# Patient Record
Sex: Male | Born: 1960
Health system: Southern US, Community
[De-identification: ages and names within clinical notes are randomized; demographics above are authoritative.]

## PROBLEM LIST (undated history)

## (undated) DIAGNOSIS — N289 Disorder of kidney and ureter, unspecified: Secondary | ICD-10-CM

## (undated) DIAGNOSIS — I10 Essential (primary) hypertension: Secondary | ICD-10-CM

## (undated) DIAGNOSIS — E119 Type 2 diabetes mellitus without complications: Secondary | ICD-10-CM

## (undated) DIAGNOSIS — I639 Cerebral infarction, unspecified: Secondary | ICD-10-CM

## (undated) HISTORY — PX: KNEE SURGERY: SHX244

---

## 2000-01-31 ENCOUNTER — Encounter: Admission: RE | Admit: 2000-01-31 | Discharge: 2000-01-31 | Payer: Self-pay | Admitting: Internal Medicine

## 2007-04-13 ENCOUNTER — Emergency Department (HOSPITAL_COMMUNITY): Admission: EM | Admit: 2007-04-13 | Discharge: 2007-04-13 | Payer: Self-pay | Admitting: Emergency Medicine

## 2007-07-03 ENCOUNTER — Emergency Department (HOSPITAL_COMMUNITY): Admission: EM | Admit: 2007-07-03 | Discharge: 2007-07-05 | Payer: Self-pay | Admitting: Emergency Medicine

## 2007-07-05 ENCOUNTER — Emergency Department (HOSPITAL_COMMUNITY): Admission: EM | Admit: 2007-07-05 | Discharge: 2007-07-06 | Payer: Self-pay | Admitting: Emergency Medicine

## 2007-07-05 ENCOUNTER — Emergency Department (HOSPITAL_COMMUNITY): Admission: EM | Admit: 2007-07-05 | Discharge: 2007-07-05 | Payer: Self-pay | Admitting: Emergency Medicine

## 2007-10-15 ENCOUNTER — Emergency Department (HOSPITAL_COMMUNITY): Admission: EM | Admit: 2007-10-15 | Discharge: 2007-10-15 | Payer: Self-pay | Admitting: Emergency Medicine

## 2008-02-12 ENCOUNTER — Emergency Department (HOSPITAL_COMMUNITY): Admission: EM | Admit: 2008-02-12 | Discharge: 2008-02-13 | Payer: Self-pay | Admitting: Emergency Medicine

## 2008-08-20 ENCOUNTER — Emergency Department (HOSPITAL_COMMUNITY): Admission: EM | Admit: 2008-08-20 | Discharge: 2008-08-20 | Payer: Self-pay | Admitting: Emergency Medicine

## 2008-12-13 ENCOUNTER — Emergency Department (HOSPITAL_COMMUNITY): Admission: EM | Admit: 2008-12-13 | Discharge: 2008-12-13 | Payer: Self-pay | Admitting: Emergency Medicine

## 2009-01-12 ENCOUNTER — Emergency Department (HOSPITAL_COMMUNITY): Admission: EM | Admit: 2009-01-12 | Discharge: 2009-01-12 | Payer: Self-pay | Admitting: Emergency Medicine

## 2009-05-31 ENCOUNTER — Encounter: Payer: Self-pay | Admitting: Cardiology

## 2009-05-31 ENCOUNTER — Ambulatory Visit: Payer: Self-pay | Admitting: Cardiology

## 2009-05-31 ENCOUNTER — Inpatient Hospital Stay (HOSPITAL_COMMUNITY): Admission: EM | Admit: 2009-05-31 | Discharge: 2009-06-03 | Payer: Self-pay | Admitting: Emergency Medicine

## 2009-06-01 ENCOUNTER — Encounter (INDEPENDENT_AMBULATORY_CARE_PROVIDER_SITE_OTHER): Payer: Self-pay | Admitting: Internal Medicine

## 2009-06-01 ENCOUNTER — Encounter: Payer: Self-pay | Admitting: Cardiology

## 2009-06-05 ENCOUNTER — Emergency Department (HOSPITAL_COMMUNITY): Admission: EM | Admit: 2009-06-05 | Discharge: 2009-06-06 | Payer: Self-pay | Admitting: Emergency Medicine

## 2009-06-11 ENCOUNTER — Ambulatory Visit: Payer: Self-pay | Admitting: Internal Medicine

## 2009-06-11 DIAGNOSIS — E119 Type 2 diabetes mellitus without complications: Secondary | ICD-10-CM | POA: Insufficient documentation

## 2009-06-11 DIAGNOSIS — I10 Essential (primary) hypertension: Secondary | ICD-10-CM | POA: Insufficient documentation

## 2009-06-11 DIAGNOSIS — N259 Disorder resulting from impaired renal tubular function, unspecified: Secondary | ICD-10-CM

## 2009-06-11 DIAGNOSIS — E785 Hyperlipidemia, unspecified: Secondary | ICD-10-CM

## 2009-06-11 DIAGNOSIS — F1410 Cocaine abuse, uncomplicated: Secondary | ICD-10-CM

## 2009-06-11 DIAGNOSIS — I519 Heart disease, unspecified: Secondary | ICD-10-CM

## 2009-06-11 LAB — CONVERTED CEMR LAB: Hgb A1c MFr Bld: 9 %

## 2009-07-14 ENCOUNTER — Encounter (INDEPENDENT_AMBULATORY_CARE_PROVIDER_SITE_OTHER): Payer: Self-pay | Admitting: Internal Medicine

## 2009-08-14 ENCOUNTER — Ambulatory Visit: Payer: Self-pay | Admitting: Internal Medicine

## 2009-08-14 DIAGNOSIS — K625 Hemorrhage of anus and rectum: Secondary | ICD-10-CM | POA: Insufficient documentation

## 2009-10-13 ENCOUNTER — Ambulatory Visit: Payer: Self-pay | Admitting: Internal Medicine

## 2009-11-15 LAB — CONVERTED CEMR LAB
ALT: 38 units/L (ref 0–53)
Albumin: 4.3 g/dL (ref 3.5–5.2)
Albumin: 4.6 g/dL (ref 3.5–5.2)
Alkaline Phosphatase: 45 units/L (ref 39–117)
Alkaline Phosphatase: 47 units/L (ref 39–117)
BUN: 19 mg/dL (ref 6–23)
CO2: 24 meq/L (ref 19–32)
Calcium: 9.2 mg/dL (ref 8.4–10.5)
Calcium: 9.2 mg/dL (ref 8.4–10.5)
Chloride: 102 meq/L (ref 96–112)
Cholesterol: 158 mg/dL (ref 0–200)
Creatinine, Ser: 1.45 mg/dL (ref 0.40–1.50)
Glucose, Bld: 120 mg/dL — ABNORMAL HIGH (ref 70–99)
Glucose, Bld: 181 mg/dL — ABNORMAL HIGH (ref 70–99)
LDL Cholesterol: 91 mg/dL (ref 0–99)
Potassium: 4.1 meq/L (ref 3.5–5.3)
Total Bilirubin: 0.5 mg/dL (ref 0.3–1.2)
Total Bilirubin: 0.5 mg/dL (ref 0.3–1.2)
Total CHOL/HDL Ratio: 4.1
Total Protein: 7.1 g/dL (ref 6.0–8.3)
Total Protein: 7.4 g/dL (ref 6.0–8.3)
VLDL: 28 mg/dL (ref 0–40)

## 2009-11-18 ENCOUNTER — Encounter (INDEPENDENT_AMBULATORY_CARE_PROVIDER_SITE_OTHER): Payer: Self-pay | Admitting: *Deleted

## 2010-01-01 ENCOUNTER — Ambulatory Visit: Payer: Self-pay | Admitting: Internal Medicine

## 2010-01-01 LAB — CONVERTED CEMR LAB: Blood Glucose, Fingerstick: 146

## 2010-01-08 LAB — CONVERTED CEMR LAB
CO2: 25 meq/L (ref 19–32)
Calcium: 9.1 mg/dL (ref 8.4–10.5)
Potassium: 4.6 meq/L (ref 3.5–5.3)

## 2010-01-21 ENCOUNTER — Ambulatory Visit: Payer: Self-pay | Admitting: Family Medicine

## 2010-01-21 ENCOUNTER — Encounter (INDEPENDENT_AMBULATORY_CARE_PROVIDER_SITE_OTHER): Payer: Self-pay | Admitting: Internal Medicine

## 2010-01-28 ENCOUNTER — Encounter (INDEPENDENT_AMBULATORY_CARE_PROVIDER_SITE_OTHER): Payer: Self-pay | Admitting: *Deleted

## 2010-05-07 ENCOUNTER — Ambulatory Visit: Payer: Self-pay | Admitting: Internal Medicine

## 2010-05-07 DIAGNOSIS — L219 Seborrheic dermatitis, unspecified: Secondary | ICD-10-CM | POA: Insufficient documentation

## 2010-05-07 LAB — CONVERTED CEMR LAB
AST: 17 units/L (ref 0–37)
BUN: 21 mg/dL (ref 6–23)
Creatinine, Ser: 1.47 mg/dL (ref 0.40–1.50)
Hgb A1c MFr Bld: 7.1 % — ABNORMAL HIGH (ref <5.7)

## 2010-06-04 ENCOUNTER — Encounter (INDEPENDENT_AMBULATORY_CARE_PROVIDER_SITE_OTHER): Payer: Self-pay | Admitting: Internal Medicine

## 2010-06-29 ENCOUNTER — Ambulatory Visit: Payer: Self-pay | Admitting: Internal Medicine

## 2010-07-27 LAB — CONVERTED CEMR LAB
Alkaline Phosphatase: 42 units/L (ref 39–117)
Calcium: 9.3 mg/dL (ref 8.4–10.5)
Cholesterol: 158 mg/dL (ref 0–200)
Creatinine, Ser: 1.41 mg/dL (ref 0.40–1.50)
Sodium: 140 meq/L (ref 135–145)
Total Bilirubin: 0.5 mg/dL (ref 0.3–1.2)
Total Protein: 7.1 g/dL (ref 6.0–8.3)
Triglycerides: 110 mg/dL (ref <150)
VLDL: 22 mg/dL (ref 0–40)

## 2010-07-29 ENCOUNTER — Encounter (INDEPENDENT_AMBULATORY_CARE_PROVIDER_SITE_OTHER): Payer: Self-pay | Admitting: Internal Medicine

## 2010-09-07 NOTE — Letter (Signed)
Summary: *HSN Results Follow up  [facility]  [address] [place], [place] [address]   Phone: [phone]  Fax: [phone]      06/04/2010   Javier Rivera [address] [place], [place]  [address]   Dear  Mr. Javier Rivera,                            ____S.Drinkard,FNP   ____D. Gore,FNP       ____B. McPherson,MD   ____V. Rankins,MD    ___X_E. Mulberry,MD    ____N. Daphine Deutscher, FNP  ____D. Reche Dixon, MD    ____K. Philipp Deputy, MD    ____Other     This letter is to inform you that your recent test(s):  _______Pap Smear    ____X___Lab Test     _______X-ray    ___X____ is within acceptable limits  _______ requires a medication change  _______ requires a follow-up lab visit  _______ requires a follow-up visit with your Sister Carbone   Comments:  Your diabetic  control is better than I thought it would be with A1C of 7.1%.  Kidney function was okay as well.  Make sure you follow up for fasting labs as in your instructions at last visit.       _________________________________________________________ If you have any questions, please contact our office                     Sincerely,  Julieanne Manson MD [facility]

## 2010-09-07 NOTE — Letter (Signed)
Summary: [facility]  [facility]   Imported By: Arta Bruce 07/07/2010 16:13:48  _____________________________________________________________________  External Attachment:    Type:   Image     Comment:   External Document

## 2010-09-07 NOTE — Letter (Signed)
Summary: [facility]  [facility]   Imported ByArta Bruce 05/13/2010 14:49:37  _____________________________________________________________________  External Attachment:    Type:   Image     Comment:   External Document

## 2010-09-07 NOTE — Letter (Signed)
Summary: *HSN Results Follow up  [facility]  [address] [place], [place] [address]   Phone: [phone]  Fax: [phone]      11/18/2009   Javier Rivera [address] [place], [place]  [address]   Dear  [name],                            ____S.Drinkard,FNP   ____D. Gore,FNP       ____B. McPherson,MD   ____V. Rankins,MD    __xx__E. Mulberry,MD    ____N. Daphine Deutscher, FNP  ____D. Reche Dixon, MD    ____K. Philipp Deputy, MD    ____Other     This letter is to inform you that your recent test(s):  _______Pap Smear    ____xx___Lab Test     _______X-ray     _______ is within acceptable limits  ___xx____ requires a medication change  _______ requires a follow-up lab visit   ____ requires a follow-up visit with your provider       Please give [place] a call regarding your recent lab work thank you.      _________________________________________________________ If you have any questions, please contact our office                     Sincerely,  Vesta Mixer CMA [facility]

## 2010-09-07 NOTE — Assessment & Plan Note (Signed)
Summary: f/u with Javier Rivera in 3 months/ dm htn//gk   Vital Signs:  Patient profile:   50 year old male Weight:      276 pounds Temp:     97.9 degrees F Pulse rate:   73 / minute Pulse rhythm:   regular Resp:     18 per minute BP sitting:   124 / 82  (left arm) Cuff size:   large  Vitals Entered By: Javier Rivera CMA (Jan 01, 2010 11:49 AM) CC: f/u dm and htn Is Patient Diabetic? Yes Pain Assessment Patient in pain? no      CBG Result 146  Does patient need assistance? Ambulation Normal   CC:  f/u dm and htn.  History of Present Illness: 1.  Hypertension:  BP much better controlled with increase in Benazepril.  2.  DM:  Pt's creatinine was okay after last visit.  Not checking sugars regularly--when does, around 140.  Missed both Retasure and Diabetic educator appt.  Since last here, cut way back on sugary foods.  Pt. also planning to increase exercise.  Would like to see how he does without adding Metformin.    3. Hyperlipidemia:  Just started 40 mg of Pravastatin--switched by pharmacy to this.  Not forgetting meds now.  Taking Pravastatin in the morning.  Current Medications (verified): 1)  Hydrochlorothiazide 25 Mg Tabs (Hydrochlorothiazide) .Marland Kitchen.. 1 Tab By Mouth Once Daily 2)  Benazepril Hcl 40 Mg Tabs (Benazepril Hcl) .Marland Kitchen.. 1 Tab By Mouth Daily 3)  Amlodipine Besylate 10 Mg Tabs (Amlodipine Besylate) .Marland Kitchen.. 1 Tab By Mouth Once Daily 4)  Simvastatin 20 Mg Tabs (Simvastatin) .Marland Kitchen.. 1 Tab By Mouth Daily 5)  Aspir-Low 81 Mg Tbec (Aspirin) .Marland Kitchen.. 1 Tab By Mouth Once Daily 6)  Glucotrol Xl 5 Mg Xr24h-Tab (Glipizide) .Marland Kitchen.. 1 Tab By Mouth Daily With Morning Meal 7)  Katheren Puller Presto W/device Kit (Blood Glucose Monitoring Suppl) .... Two Times A Day Glucose Checks 250.02 8)  Wavesense Presto Test  Strp (Glucose Blood) .... Two Times A Day Blood Glucose Checks 9)  Lancets  Misc (Lancets) .... Two Times A Day Glucose Checks  Allergies (verified): No Known Drug Allergies  Physical  Exam  Lungs:  Normal respiratory effort, chest expands symmetrically. Lungs are clear to auscultation, no crackles or wheezes. Heart:  Normal rate and regular rhythm. S1 and S2 normal without gallop, murmur, click, rub or other extra sounds.  Radial pulses normal and equal   Impression & Recommendations:  Problem # 1:  ESSENTIAL HYPERTENSION (ICD-401.9) Much better control His updated medication list for this problem includes:    Hydrochlorothiazide 25 Mg Tabs (Hydrochlorothiazide) .Marland Kitchen... 1 tab by mouth once daily    Benazepril Hcl 40 Mg Tabs (Benazepril hcl) .Marland Kitchen... 1 tab by mouth daily    Amlodipine Besylate 10 Mg Tabs (Amlodipine besylate) .Marland Kitchen... 1 tab by mouth once daily  Orders: T-Basic Metabolic Panel [phone])  Problem # 2:  DYSLIPIDEMIA (ICD-272.4) Changed to Pravastatin by pharmacy--just initiated. His updated medication list for this problem includes:    Pravastatin Sodium 40 Mg Tabs (Pravastatin sodium) .Marland Kitchen... 1 tab by mouth daily  Problem # 3:  DIABETES MELLITUS, TYPE II, UNCONTROLLED (ICD-250.02) Hold on adding Metformin until next follow up His updated medication list for this problem includes:    Benazepril Hcl 40 Mg Tabs (Benazepril hcl) .Marland Kitchen... 1 tab by mouth daily    Aspir-low 81 Mg Tbec (Aspirin) .Marland Kitchen... 1 tab by mouth once daily    Glucotrol Xl 5 Mg Xr24h-tab (  Glipizide) .Marland Kitchen... 1 tab by mouth daily with morning meal  Complete Medication List: 1)  Hydrochlorothiazide 25 Mg Tabs (Hydrochlorothiazide) .Marland Kitchen.. 1 tab by mouth once daily 2)  Benazepril Hcl 40 Mg Tabs (Benazepril hcl) .Marland Kitchen.. 1 tab by mouth daily 3)  Amlodipine Besylate 10 Mg Tabs (Amlodipine besylate) .Marland Kitchen.. 1 tab by mouth once daily 4)  Pravastatin Sodium 40 Mg Tabs (Pravastatin sodium) .Marland Kitchen.. 1 tab by mouth daily 5)  Aspir-low 81 Mg Tbec (Aspirin) .Marland Kitchen.. 1 tab by mouth once daily 6)  Glucotrol Xl 5 Mg Xr24h-tab (Glipizide) .Marland Kitchen.. 1 tab by mouth daily with morning meal 7)  Glenetta Borg W/device Kit (Blood glucose  monitoring suppl) .... Two times a day glucose checks 250.02 8)  Wavesense Presto Test Strp (Glucose blood) .... Two times a day blood glucose checks 9)  Lancets Misc (Lancets) .... Two times a day glucose checks  Other Orders: Capillary Blood Glucose/CBG ([address])  Patient Instructions: 1)  Reschedule Retasure 2)  REschedule appt. with Susie Piper 3)  Fasting labs in 6 weeks--FLP, liver 4)  Follow up with Dr. Delrae Alfred in 4 months --DM

## 2010-09-07 NOTE — Assessment & Plan Note (Signed)
Summary: FOLLOW UP WITH DR Savalas Monje IN 2 MONTHS / NEEDS LABS//GK   Vital Signs:  Patient profile:   50 year old male Weight:      279.1 pounds Temp:     97.7 degrees F oral Pulse rate:   77 / minute Pulse rhythm:   regular Resp:     21 per minute BP sitting:   159 / 111  (left arm) Cuff size:   large  Vitals Entered By: Geanie Cooley (August 14, 2009 10:08 AM) CC: pt here for followup, pt states he was constipated wednesday he had a lttle l blood in his stool, he states hes been having regular bowel movements since then and hasnt had anymore blood in his stool. Is Patient Diabetic? No Pain Assessment Patient in pain? no       Does patient need assistance? Functional Status Self care Ambulation Normal   CC:  pt here for followup, pt states he was constipated wednesday he had a lttle l blood in his stool, and he states hes been having regular bowel movements since then and hasnt had anymore blood in his stool.Marland Kitchen  History of Present Illness: 1.  Hypertension:  Pt. with 2 weeks left of meds and should have been refilled based on dates of refills, but pt. unable to pick up meds until 2 weeks ago--which correlates with how many pills left.  Taking low dose Benazepril, Norvasc 10 mg, 25 mg HCTZ.  Tolerating okay  2.  DM:  Has not had pneumovax in hospital that he can recall.  Discussed diet and exercise.  Has been set up with [facility] card since last here--can get set up with ancillary services now.  No way to check sugars at home currently.  3.  Constipation 2 days ago after eating a lot of cheese--straining at toilet and noted blood on tissue only.  Since then normal BMs with no blood.    Allergies (verified): No Known Drug Allergies  Physical Exam  General:  NAD Lungs:  Normal respiratory effort, chest expands symmetrically. Lungs are clear to auscultation, no crackles or wheezes. Heart:  Normal rate and regular rhythm. S1 and S2 normal without gallop, murmur, click, rub  or other extra sounds.  Radial pulses normal and equal. Extremities:  No edema   Impression & Recommendations:  Problem # 1:  ESSENTIAL HYPERTENSION (ICD-401.9)  Not controlled, but not on meds long. Bump up Benazepril to 20 mg His updated medication list for this problem includes:    Hydrochlorothiazide 25 Mg Tabs (Hydrochlorothiazide) .Marland Kitchen... 1 tab by mouth once daily    Benazepril Hcl 20 Mg Tabs (Benazepril hcl) .Marland Kitchen... 1 tab by mouth daily    Amlodipine Besylate 10 Mg Tabs (Amlodipine besylate) .Marland Kitchen... 1 tab by mouth once daily  Orders: T-Comprehensive Metabolic Panel ([id number]) Nutrition Referral (Nutrition)  Problem # 2:  DIABETES MELLITUS, TYPE II, UNCONTROLLED (ICD-250.02) 20 minute discussion alone regarding complications and prevention, checking feet, following sugars, A1C, eye exams Check renal function to see if can start Metformin His updated medication list for this problem includes:    Benazepril Hcl 20 Mg Tabs (Benazepril hcl) .Marland Kitchen... 1 tab by mouth daily    Aspir-low 81 Mg Tbec (Aspirin) .Marland Kitchen... 1 tab by mouth once daily    Glucotrol Xl 5 Mg Xr24h-tab (Glipizide) .Marland Kitchen... 1 tab by mouth daily with morning meal  Orders: T-Comprehensive Metabolic Panel ([id number]) Nutrition Referral (Nutrition)  Problem # 3:  RENAL INSUFFICIENCY (ICD-588.9) Recheck labs  Problem # 4:  DYSLIPIDEMIA (ICD-272.4) Not on meds long enough to test His updated medication list for this problem includes:    Simvastatin 10 Mg Tabs (Simvastatin) .Marland Kitchen... 1 tab by mouth once daily  Orders: Nutrition Referral (Nutrition)  Problem # 5:  RECTAL BLEEDING (ICD-569.3) Guaiac card packet--suspect just from straining--will see back for CPE in near future--once other health concerns equilibrate.  Problem # 6:  Preventive Health Care (ICD-V70.0) Pneumovax  Complete Medication List: 1)  Hydrochlorothiazide 25 Mg Tabs (Hydrochlorothiazide) .Marland Kitchen.. 1 tab by mouth once daily 2)  Benazepril Hcl 20 Mg Tabs  (Benazepril hcl) .Marland Kitchen.. 1 tab by mouth daily 3)  Amlodipine Besylate 10 Mg Tabs (Amlodipine besylate) .Marland Kitchen.. 1 tab by mouth once daily 4)  Simvastatin 10 Mg Tabs (Simvastatin) .Marland Kitchen.. 1 tab by mouth once daily 5)  Aspir-low 81 Mg Tbec (Aspirin) .Marland Kitchen.. 1 tab by mouth once daily 6)  Glucotrol Xl 5 Mg Xr24h-tab (Glipizide) .Marland Kitchen.. 1 tab by mouth daily with morning meal 7)  Glenetta Borg W/device Kit (Blood glucose monitoring suppl) .... Two times a day glucose checks 250.02 8)  Wavesense Presto Test Strp (Glucose blood) .... Two times a day blood glucose checks 9)  Lancets Misc (Lancets) .... Two times a day glucose checks  Other Orders: Pneumococcal Vaccine (376[address]) Admin 1st Vaccine (31517) Admin 1st Vaccine [facility]) [phone])  Patient Instructions: 1)  Take 2 tabs of 10 mg Benazepril daily until get 20 mg tabs--then go back to 1 tab daily 2)  BP check --nurse visit in 3 weeks with CMET (V58.69) and FLP 272.2 3)  Follow up with Dr. Delrae Alfred in 2 months  DM, htn 4)  Call if you do not hear from Nutrition in 2 weeks. 5)  Schedule Retasure 6)  Check feet nightly 7)  Good foot care Prescriptions: GLUCOTROL XL 5 MG XR24H-TAB (GLIPIZIDE) 1 tab by mouth daily with morning meal  #30 x 11   Entered and Authorized by:   Julieanne Manson MD   Signed by:   Julieanne Manson MD on 08/14/2009   Method used:   Faxed to ...       [facility] (retail)       [address] [place], [place]  [address]       Ph: [phone]       Fax: [phone]   RxID:   [id number] ASPIR-LOW 81 MG TBEC (ASPIRIN) 1 tab by mouth once daily  #30 x 11   Entered and Authorized by:   Julieanne Manson MD   Signed by:   Julieanne Manson MD on 08/14/2009   Method used:   Faxed to ...       [facility] - [facility] (retail)       [address] [place], [place]  [address]       Ph: [phone]       Fax: [phone]   RxID:    [id number] SIMVASTATIN 10 MG TABS (SIMVASTATIN) 1 tab by mouth once daily  #30 x 11   Entered and Authorized by:   Julieanne Manson MD   Signed by:   Julieanne Manson MD on 08/14/2009   Method used:   Faxed to ...       [facility] - Pharmac (retail)       [address] [place], [place]  [address]       Ph:  [phone]       Fax: [phone]   RxID:   [id number] AMLODIPINE BESYLATE 10 MG TABS (AMLODIPINE BESYLATE) 1 tab by mouth once daily  #30 x 11   Entered and Authorized by:   Julieanne Manson MD   Signed by:   Julieanne Manson MD on 08/14/2009   Method used:   Faxed to ...       [facility] - Pharmac (retail)       [address] [place], [place]  [address]       Ph: [phone]       Fax: [phone]   RxID:   [id number] HYDROCHLOROTHIAZIDE 25 MG TABS (HYDROCHLOROTHIAZIDE) 1 tab by mouth once daily  #30 x 11   Entered and Authorized by:   Julieanne Manson MD   Signed by:   Julieanne Manson MD on 08/14/2009   Method used:   Faxed to ...       [facility] - Pharmac (retail)       [address] [place], [place]  [address]       Ph: [phone]       Fax: [phone]   RxID:   [id number] LANCETS  MISC (LANCETS) two times a day glucose checks  #60 x 11   Entered and Authorized by:   Julieanne Manson MD   Signed by:   Julieanne Manson MD on 08/14/2009   Method used:   Faxed to ...       [facility] - Pharmac (retail)       [address] [place], [place]  [address]       Ph: [phone]       Fax: [phone]   RxID:   [id number] WAVESENSE PRESTO TEST  STRP (GLUCOSE BLOOD) two times a day blood glucose checks  #60 x 11   Entered and Authorized by:   Julieanne Manson MD   Signed by:   Julieanne Manson MD on 08/14/2009   Method used:   Faxed to ...       [facility] - Pharmac (retail)       [address] [place], [place]  [address]       Ph: [phone]       Fax: [phone]   RxID:   [id number] WAVESENSE PRESTO W/DEVICE KIT (BLOOD GLUCOSE MONITORING SUPPL) two times a day glucose checks 250.02  #1 x 0   Entered and Authorized by:   Julieanne Manson MD   Signed by:   Julieanne Manson MD on 08/14/2009   Method used:   Faxed to ...       [facility] - Pharmac (retail)       [address] [place], [place]  [address]       Ph: [phone]       Fax: [phone]   RxID:   [id number] BENAZEPRIL HCL 20 MG TABS (BENAZEPRIL HCL) 1 tab by mouth daily  #30 x 11   Entered and Authorized by:   Julieanne Manson MD   Signed by:   Julieanne Manson MD on 08/14/2009   Method used:   Faxed to ...       [facility] - Pharmac (retail)       [address]  [place], [place]  [address]       Ph: [phone]       Fax: [phone]   RxID:   [id number]    Vital Signs:  Patient profile:   50 year old male Weight:      279.1 pounds Temp:     97.7 degrees F oral Pulse rate:   77 / minute Pulse rhythm:   regular Resp:     21 per minute BP sitting:   159 / 111  (left arm) Cuff size:   large  Vitals Entered By: Geanie Cooley (August 14, 2009 10:08 AM)   Tetanus/Td Immunization History:    Tetanus/Td # 1:  Historical (11/06/2008)  Pneumovax Vaccine    Vaccine Type: Pneumovax    Site: right deltoid    Mfr: Merck    Dose: 0.5 ml    Route: IM    Given by: Vesta Mixer CMA    Exp. Date: 09/04/2010    Lot #: [id number]    VIS given: 03/05/96 version given August 14, 2009.

## 2010-09-07 NOTE — Assessment & Plan Note (Signed)
Summary: 4 month f/u /tmm   Vital Signs:  Patient profile:   50 year old male Height:      80 inches Weight:      287.3 pounds BMI:     31.68 Temp:     97.8 degrees F Pulse rate:   78 / minute Resp:     16 per minute BP sitting:   140 / 100  (left arm) Cuff size:   large  Vitals Entered By: Michelle Nasuti (May 07, 2010 11:38 AM) CC: follow-up visit. pt c/o fungus on R 4th toe. unable to cut toenail Pain Assessment Patient in pain? no       Does patient need assistance? Ambulation Normal   CC:  follow-up visit. pt c/o fungus on R 4th toe. unable to cut toenail.  History of Present Illness: 1.  Pink raised  ovoid spots on face:  cannot say how long they have been there.  He did not bring this up--just noted.  No other areas of skin involved.  Has not been sweating a lot.  Has had this before--comes and goes.  sometimes flakes.  2.  Right 4th toenail is thickened and irritated.  Hard to cut.  Uncomfortable.  Has a few other nails that are less involved.  3.  Renal Insufficiency:  We were unable to get hold of pt. to recheck.  Pt. gives me multiple phone numbers, but one phone is currently broken.  4.  DM:  not checking sugars.  No polyuria and polydipsia.  5.  Hypertension: did not take bp med today.  Will not be able to pick up until next week.  Allergies (verified): No Known Drug Allergies  Physical Exam  General:  Raised pink ovoid and circular lesions on skin--continuous area of involvement in mustache area and nasolabial folds, chin.  No other areas of involvement Lungs:  Normal respiratory effort, chest expands symmetrically. Lungs are clear to auscultation, no crackles or wheezes. Heart:  Normal rate and regular rhythm. S1 and S2 normal without gallop, murmur, click, rub or other extra sounds. Extremities:  Thickened discolored distal toenails on right great and 2nd toe.  4th toenail very thickened with underlying crumbling all the way to base.  Little  toenails involved bilaterally.   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   Also add topical hydrocortisone.  Problem # 2:  RENAL INSUFFICIENCY (ICD-588.9)  Recheck labs--encouraged better follow up  Orders: T-Comprehensive Metabolic Panel ([id number])  Problem # 3:  DYSLIPIDEMIA (ICD-272.4) Return for labs in 6 weeks--off meds for next few days as did not call in early enough His updated medication list for this problem includes:    Pravastatin Sodium 40 Mg Tabs (Pravastatin sodium) .Marland Kitchen... 1 tab by mouth daily  Problem # 4:  DIABETES MELLITUS, TYPE II, UNCONTROLLED (ICD-250.02) Suspect will not be well controlled Flu vaccine today His updated medication list for this problem includes:    Benazepril Hcl 40 Mg Tabs (Benazepril hcl) .Marland Kitchen... 1 tab by mouth daily    Aspir-low 81 Mg Tbec (Aspirin) .Marland Kitchen... 1 tab by mouth once daily    Glucotrol Xl 5 Mg Xr24h-tab (Glipizide) .Marland Kitchen... 1 tab by mouth daily with morning meal  Orders: T-Comprehensive Metabolic Panel [phone]) T- Hemoglobin A1C ([id number])  Problem # 5:  ESSENTIAL HYPERTENSION (ICD-401.9) Elevated--off meds currently His updated medication list for this problem includes:    Hydrochlorothiazide 25 Mg Tabs (Hydrochlorothiazide) .Marland Kitchen... 1 tab by mouth  once daily    Benazepril Hcl 40 Mg Tabs (Benazepril hcl) .Marland Kitchen... 1 tab by mouth daily    Amlodipine Besylate 10 Mg Tabs (Amlodipine besylate) .Marland Kitchen... 1 tab by mouth once daily  Complete Medication List: 1)  Hydrochlorothiazide 25 Mg Tabs (Hydrochlorothiazide) .Marland Kitchen.. 1 tab by mouth once daily 2)  Benazepril Hcl 40 Mg Tabs (Benazepril hcl) .Marland Kitchen.. 1 tab by mouth daily 3)  Amlodipine Besylate 10 Mg Tabs (Amlodipine besylate) .Marland Kitchen.. 1 tab by mouth once daily 4)  Pravastatin Sodium 40 Mg Tabs (Pravastatin sodium) .Marland Kitchen.. 1 tab by mouth daily 5)  Aspir-low 81 Mg Tbec (Aspirin) .Marland Kitchen.. 1 tab by mouth once daily 6)   Glucotrol Xl 5 Mg Xr24h-tab (Glipizide) .Marland Kitchen.. 1 tab by mouth daily with morning meal 7)  Glenetta Borg W/device Kit (Blood glucose monitoring suppl) .... Two times a day glucose checks 250.02 8)  Wavesense Presto Test Strp (Glucose blood) .... Two times a day blood glucose checks 9)  Lancets Misc (Lancets) .... Two times a day glucose checks 10)  Hydrocortisone 2.5 % Crea (Hydrocortisone) .... Apply two times a day to facial lesions as needed--small amt only 11)  Terbinafine Hcl 250 Mg Tabs (Terbinafine hcl) .Marland Kitchen.. 1 tab by mouth daily for 12 weeks  Other Orders: Influenza Vaccine NON MCR ([address])  Patient Instructions: 1)  fasting labs in 6 weeks:  FLP, CMet:  272.4 and V58.69 Prescriptions: PRAVASTATIN SODIUM 40 MG TABS (PRAVASTATIN SODIUM) 1 tab by mouth daily  #30 x 11   Entered and Authorized by:   Julieanne Manson MD   Signed by:   Julieanne Manson MD on 05/07/2010   Method used:   Faxed to ...       [facility] - Pharmac (retail)       [address] [place], [place]  [address]       Ph: [phone]       Fax: [phone]   RxID:   [id number] TERBINAFINE HCL 250 MG TABS (TERBINAFINE HCL) 1 tab by mouth daily for 12 weeks  #84 x 0   Entered and Authorized by:   Julieanne Manson MD   Signed by:   Julieanne Manson MD on 05/07/2010   Method used:   Faxed to ...       [facility] - Pharmac (retail)       [address] [place], [place]  [address]       Ph: [phone]       Fax: [phone]   RxID:   [id number] HYDROCORTISONE 2.5 % CREA (HYDROCORTISONE) apply two times a day to facial lesions as needed--small amt only  #30 g x 1   Entered and Authorized by:   Julieanne Manson MD   Signed by:   Julieanne Manson MD on 05/07/2010   Method used:   Faxed to ...       [facility] - Pharmac (retail)       [address] [place], [place]  [address]       Ph:  [phone]       Fax: [phone]   RxID:   [id number]    Influenza Vaccine    Vaccine Type: Fluvax Non-MCR    Site: left deltoid    Mfr: Sanofi Pasteur    Dose: 0.5 ml    Route: IM    Given by: Michelle Nasuti  Exp. Date: 02/05/2011    Lot #: [id number]    VIS given: 03/02/10 version given May 07, 2010.  Flu Vaccine Consent Questions    Do you have a history of severe allergic reactions to this vaccine? no    Any prior history of allergic reactions to egg and/or gelatin? no    Do you have a sensitivity to the preservative Thimersol? no    Do you have a past history of Guillan-Barre Syndrome? no    Do you currently have an acute febrile illness? no    Have you ever had a severe reaction to latex? no    Vaccine information given and explained to patient? yes

## 2010-09-07 NOTE — Letter (Signed)
Summary: *HSN Results Follow up  [facility]  [address] [place], [place] [address]   Phone: [phone]  Fax: [phone]      01/28/2010   ORYN CASANOVA [address] [place], [place]  [address]   Dear  Mr. DAEQUAN KOZMA,                            ____S.Drinkard,FNP   ____D. Gore,FNP       ____B. McPherson,MD   ____V. Rankins,MD    __X__E. Mulberry,MD    ____N. Daphine Deutscher, FNP  ____D. Reche Dixon, MD    ____K. Philipp Deputy, MD    ____Other     This letter is to inform you that your recent test(s):  _______Pap Smear    _______Lab Test     _______X-ray    _______ is within acceptable limits  _______ requires a medication change  ___X____ requires a follow-up lab visit  _______ requires a follow-up visit with your provider   Comments:  Please give our office a call to schedule a lab appointment.  Thank  you.       _________________________________________________________ If you have any questions, please contact our office                     Sincerely,  Vesta Mixer CMA [facility]

## 2010-09-07 NOTE — Assessment & Plan Note (Signed)
Summary: follow up with Dr Delrae Alfred in months dm/htn///gk   Vital Signs:  Patient profile:   50 year old male Weight:      277 pounds Temp:     97.7 degrees F Pulse rate:   82 / minute Pulse rhythm:   regular Resp:     20 per minute BP sitting:   144 / 109  (left arm) Cuff size:   large  Vitals Entered By: Vesta Mixer CMA (October 13, 2009 9:56 AM) CC: f/u htn and dm.  Pt is fasting, did not take meds either.  Has not heard from nutrition. Is Patient Diabetic? Yes Pain Assessment Patient in pain? no      CBG Result 143  Does patient need assistance? Ambulation Normal   CC:  f/u htn and dm.  Pt is fasting and did not take meds either.  Has not heard from nutrition.Marland Kitchen  History of Present Illness: 1.  Hypertension:  Did not take meds today as fasting.  Misses appt. for follow up nursing visit for bp check.  Checks bp at home.  States bp running at home what bp is today.  Later, obvious he misses a week at a time every month as does not get med picked up.    2.  Hyperlipidemia:  Missing meds as above.  Also misses as forgets in evening.  Does not miss morning meds.  Fasting today.  3.  DM:  Sugars running between 120- 140.  Never heard from Nutritionist. Did not get a second CMET--pt. missed appt.   Would like to see a normal creatinine x2 before considering Metformin.  Current Medications (verified): 1)  Hydrochlorothiazide 25 Mg Tabs (Hydrochlorothiazide) .Marland Kitchen.. 1 Tab By Mouth Once Daily 2)  Benazepril Hcl 20 Mg Tabs (Benazepril Hcl) .Marland Kitchen.. 1 Tab By Mouth Daily 3)  Amlodipine Besylate 10 Mg Tabs (Amlodipine Besylate) .Marland Kitchen.. 1 Tab By Mouth Once Daily 4)  Simvastatin 10 Mg Tabs (Simvastatin) .Marland Kitchen.. 1 Tab By Mouth Once Daily 5)  Aspir-Low 81 Mg Tbec (Aspirin) .Marland Kitchen.. 1 Tab By Mouth Once Daily 6)  Glucotrol Xl 5 Mg Xr24h-Tab (Glipizide) .Marland Kitchen.. 1 Tab By Mouth Daily With Morning Meal 7)  Katheren Puller Presto W/device Kit (Blood Glucose Monitoring Suppl) .... Two Times A Day Glucose Checks  250.02 8)  Wavesense Presto Test  Strp (Glucose Blood) .... Two Times A Day Blood Glucose Checks 9)  Lancets  Misc (Lancets) .... Two Times A Day Glucose Checks  Allergies (verified): No Known Drug Allergies  Physical Exam  General:  NAD--hungry Lungs:  Normal respiratory effort, chest expands symmetrically. Lungs are clear to auscultation, no crackles or wheezes. Heart:  Normal rate and regular rhythm. S1 and S2 normal without gallop, murmur, click, rub or other extra sounds.  Radial pulses normal and equal.   Impression & Recommendations:  Problem # 1:  RENAL INSUFFICIENCY (ICD-588.9) Recheck labs. If okay, add Metformin Orders: T-Comprehensive Metabolic Panel [id number]) T-Lipid Profile ([id number])  Problem # 2:  DYSLIPIDEMIA (ICD-272.4)  His updated medication list for this problem includes:    Simvastatin 10 Mg Tabs (Simvastatin) .Marland Kitchen... 1 tab by mouth once daily  Problem # 3:  DIABETES MELLITUS, TYPE II, UNCONTROLLED (ICD-250.02) As under renal insufficiency Improved control His updated medication list for this problem includes:    Benazepril Hcl 40 Mg Tabs (Benazepril hcl) .Marland Kitchen... 1 tab by mouth daily    Aspir-low 81 Mg Tbec (Aspirin) .Marland Kitchen... 1 tab by mouth once daily    Glucotrol Xl 5 Mg  Xr24h-tab (Glipizide) .Marland Kitchen... 1 tab by mouth daily with morning meal  Orders: T-Comprehensive Metabolic Panel [phone]) T-Lipid Profile ([id number]) Hgb A1C (72536UY)  Problem # 4:  ESSENTIAL HYPERTENSION (ICD-401.9) Not controlled, though suspect some issues with compliance. Increase Benazepril His updated medication list for this problem includes:    Hydrochlorothiazide 25 Mg Tabs (Hydrochlorothiazide) .Marland Kitchen... 1 tab by mouth once daily    Benazepril Hcl 40 Mg Tabs (Benazepril hcl) .Marland Kitchen... 1 tab by mouth daily    Amlodipine Besylate 10 Mg Tabs (Amlodipine besylate) .Marland Kitchen... 1 tab by mouth once daily  Complete Medication List: 1)  Hydrochlorothiazide 25 Mg Tabs  (Hydrochlorothiazide) .Marland Kitchen.. 1 tab by mouth once daily 2)  Benazepril Hcl 40 Mg Tabs (Benazepril hcl) .Marland Kitchen.. 1 tab by mouth daily 3)  Amlodipine Besylate 10 Mg Tabs (Amlodipine besylate) .Marland Kitchen.. 1 tab by mouth once daily 4)  Simvastatin 10 Mg Tabs (Simvastatin) .Marland Kitchen.. 1 tab by mouth once daily 5)  Aspir-low 81 Mg Tbec (Aspirin) .Marland Kitchen.. 1 tab by mouth once daily 6)  Glucotrol Xl 5 Mg Xr24h-tab (Glipizide) .Marland Kitchen.. 1 tab by mouth daily with morning meal 7)  Glenetta Borg W/device Kit (Blood glucose monitoring suppl) .... Two times a day glucose checks 250.02 8)  Wavesense Presto Test Strp (Glucose blood) .... Two times a day blood glucose checks 9)  Lancets Misc (Lancets) .... Two times a day glucose checks  Other Orders: Capillary Blood Glucose/CBG ([address]347)  Patient Instructions: 1)  Appt. with Susie Piper for nutrition and lifestyle teaching. 2)  Schedule for [facility]--pt did not make previous appt.Marland Kitchen 3)  BP check and Bmet in 1 month 4)  Follow up with Dr. Delrae Alfred in 3 months --DM, htn Prescriptions: BENAZEPRIL HCL 40 MG TABS (BENAZEPRIL HCL) 1 tab by mouth daily  #30 x 11   Entered and Authorized by:   Julieanne Manson MD   Signed by:   Julieanne Manson MD on 10/13/2009   Method used:   Faxed to ...       [facility] (retail)       [address] [place], [place]  [address]       Ph: [phone]       Fax: [phone]   RxID:   [id number]   Laboratory Results   Blood Tests     HGBA1C: 7.7%   (Normal Range: Non-Diabetic - 3-6%   Control Diabetic - 6-8%) CBG Random:: 143mg /dL

## 2010-09-09 NOTE — Letter (Signed)
Summary: *HSN Results Follow up  [facility]  [address] [place], [place] [address]   Phone: [phone]  Fax: [phone]      07/29/2010   Javier Rivera [address] [place], [place]  [address]   Dear  Javier Rivera,                            ____S.Drinkard,FNP   ____D. Gore,FNP       ____B. McPherson,MD   ____V. Rankins,MD    ____E. Mulberry,MD    ____N. Daphine Deutscher, FNP  ____D. Reche Dixon, MD    ____K. Philipp Deputy, MD    ____Other     This letter is to inform you that your recent test(s):  _______Pap Smear    ____X___Lab Test     _______X-ray    _______ is within acceptable limits  ___X____ requires a medication change  _______ requires a follow-up lab visit  _______ requires a follow-up visit with your Deanndra Kirley   Comments:  We have been trying to reach you at [phone].  Please give the office a call.       _________________________________________________________ If you have any questions, please contact our office                     Sincerely,  [place] Shannon [facility]

## 2010-11-11 LAB — DIFFERENTIAL
Basophils Absolute: 0 10*3/uL (ref 0.0–0.1)
Basophils Absolute: 0 10*3/uL (ref 0.0–0.1)
Basophils Relative: 0 % (ref 0–1)
Basophils Relative: 1 % (ref 0–1)
Eosinophils Absolute: 0.1 10*3/uL (ref 0.0–0.7)
Eosinophils Relative: 0 % (ref 0–5)
Eosinophils Relative: 1 % (ref 0–5)
Monocytes Absolute: 0.3 10*3/uL (ref 0.1–1.0)
Monocytes Relative: 7 % (ref 3–12)
Neutro Abs: 2.3 10*3/uL (ref 1.7–7.7)

## 2010-11-11 LAB — CBC
HCT: 52.1 % — ABNORMAL HIGH (ref 39.0–52.0)
Hemoglobin: 14.9 g/dL (ref 13.0–17.0)
Hemoglobin: 17.6 g/dL — ABNORMAL HIGH (ref 13.0–17.0)
MCHC: 33.3 g/dL (ref 30.0–36.0)
MCHC: 33.8 g/dL (ref 30.0–36.0)
MCV: 89.9 fL (ref 78.0–100.0)
MCV: 90.1 fL (ref 78.0–100.0)
Platelets: 216 10*3/uL (ref 150–400)
RBC: 4.92 MIL/uL (ref 4.22–5.81)
RBC: 5.79 MIL/uL (ref 4.22–5.81)
RDW: 13.5 % (ref 11.5–15.5)
WBC: 4.9 10*3/uL (ref 4.0–10.5)
WBC: 6.9 10*3/uL (ref 4.0–10.5)

## 2010-11-11 LAB — URINALYSIS, ROUTINE W REFLEX MICROSCOPIC
Leukocytes, UA: NEGATIVE
Specific Gravity, Urine: 1.036 — ABNORMAL HIGH (ref 1.005–1.030)
Urobilinogen, UA: 1 mg/dL (ref 0.0–1.0)
pH: 5.5 (ref 5.0–8.0)

## 2010-11-11 LAB — ETHANOL: Alcohol, Ethyl (B): <5 mg/dL (ref 0–10)

## 2010-11-11 LAB — BASIC METABOLIC PANEL
CO2: 25 mEq/L (ref 19–32)
CO2: 29 mEq/L (ref 19–32)
Calcium: 8.4 mg/dL (ref 8.4–10.5)
Calcium: 9.6 mg/dL (ref 8.4–10.5)
Chloride: 103 mEq/L (ref 96–112)
Chloride: 103 mEq/L (ref 96–112)
GFR calc Af Amer: 53 mL/min — ABNORMAL LOW (ref >60)
Glucose, Bld: 238 mg/dL — ABNORMAL HIGH (ref 70–99)
Sodium: 134 mEq/L — ABNORMAL LOW (ref 135–145)
Sodium: 138 mEq/L (ref 135–145)

## 2010-11-11 LAB — POCT I-STAT, CHEM 8
Chloride: 106 mEq/L (ref 96–112)
Creatinine, Ser: 2.3 mg/dL — ABNORMAL HIGH (ref 0.4–1.5)
HCT: 56 % — ABNORMAL HIGH (ref 39.0–52.0)
Potassium: 3.8 mEq/L (ref 3.5–5.1)
Sodium: 141 mEq/L (ref 135–145)

## 2010-11-11 LAB — URINE MICROSCOPIC-ADD ON

## 2010-11-11 LAB — POCT CARDIAC MARKERS
CKMB, poc: 2.2 ng/mL (ref 1.0–8.0)
Troponin i, poc: <0.05 ng/mL (ref 0.00–0.09)

## 2010-11-11 LAB — CK TOTAL AND CKMB (NOT AT ARMC)
CK, MB: 3.5 ng/mL (ref 0.3–4.0)
Relative Index: 1.1 (ref 0.0–2.5)
Total CK: 262 U/L — ABNORMAL HIGH (ref 7–232)
Total CK: 309 U/L — ABNORMAL HIGH (ref 7–232)

## 2010-11-11 LAB — RAPID URINE DRUG SCREEN, HOSP PERFORMED
Barbiturates: NOT DETECTED
Opiates: NOT DETECTED
Tetrahydrocannabinol: POSITIVE — AB

## 2010-11-11 LAB — TSH: TSH: 1.473 u[IU]/mL (ref 0.350–4.500)

## 2010-11-11 LAB — TROPONIN I
Troponin I: 0.04 ng/mL (ref 0.00–0.06)
Troponin I: 0.05 ng/mL (ref 0.00–0.06)

## 2010-12-21 NOTE — Consult Note (Signed)
NAME:  RAGE, BEEVER NO.:  [id number]   MEDICAL RECORD NO.:  [id number]          PATIENT TYPE:  EMS   LOCATION:  ED                           FACILITY:  [facility]   PHYSICIAN:  Kristine Garbe. Ezzard Standing, M.D.DATE OF BIRTH:  1961-06-17   DATE OF CONSULTATION:  DATE OF DISCHARGE:                                 CONSULTATION   REASON FOR CONSULTATION:  Evaluate a patient with right-sided epistaxis.   BRIEF HISTORY:  Javier Rivera is a 50 year old black male with a  history of hypertension.  He has been off his hypertensive medication,  as well as been taking some aspirin products, and he has been having  nosebleeds now for about a month.  Over the last couple of days, he has  had a couple of visits to the [facility], but he keeps  having recurrent nosebleeds.  Because of the recurrent nature of his  nose bleeds, I was asked to evaluate the patient.  The patient presently  is having no active bleeding.  He has no packing in his nose.   PHYSICAL EXAMINATION:  Nasal exam reveals a clear nasal passageway.  There are no obstructing lesions in the nose.  There is no obvious  anterior septal vessel noted.  He was able to blow his nose freely with  no active bleeding and no obstruction of his nose.  The left nasal  passageway likewise was clear.   IMPRESSION:  Recurrent right-sided epistaxis, questionable etiology,  could not identify a definite site or source of bleeding.  Discussed  with Javier Rivera concerning options.  One would be to place a Merocel pack in  the nose for 4 or 5 days versus observation with packing by himself if  he has any recurrent bleeding and follow up in my office if he has any  recurrent bleeding.  He really did not want packing in his nose and  elected for observation for now.  I stressed with him concerning the  importance of taking his blood pressure medication to make sure his  blood pressure is under control.  I stopped his aspirin  products for the  next 2 weeks.  He will call our office if he has any further bleeding  problems.           ______________________________  Kristine Garbe Ezzard Standing, M.D.     CEN/MEDQ  D:  07/06/2007  T:  07/06/2007  Job:  [id number]

## 2011-05-02 LAB — BASIC METABOLIC PANEL
BUN: 16
CO2: 25
Chloride: 107
Glucose, Bld: 210 — ABNORMAL HIGH
Potassium: 4.1
Sodium: 136

## 2011-05-05 LAB — POCT I-STAT, CHEM 8
BUN: 17
Calcium, Ion: 1.13
Chloride: 104
Creatinine, Ser: 1.5
TCO2: 23

## 2011-05-17 LAB — CBC
HCT: 42
Hemoglobin: 14.2
Hemoglobin: 16.3
MCHC: 34.5
MCV: 86.1
MCV: 87.6
RBC: 4.79
RBC: 5.48
RDW: 14
WBC: 8.8

## 2011-05-17 LAB — DIFFERENTIAL
Basophils Absolute: 0
Basophils Relative: 1
Eosinophils Absolute: 0 — ABNORMAL LOW
Eosinophils Absolute: 0.1 — ABNORMAL LOW
Eosinophils Relative: 1
Lymphocytes Relative: 27
Lymphs Abs: 2.4
Monocytes Absolute: 0.3
Monocytes Absolute: 0.4
Monocytes Relative: 5
Monocytes Relative: 7
Neutro Abs: 2.8

## 2011-05-17 LAB — BASIC METABOLIC PANEL
CO2: 26
Calcium: 9
Chloride: 105
Creatinine, Ser: 1.52 — ABNORMAL HIGH
GFR calc Af Amer: >60
Glucose, Bld: 143 — ABNORMAL HIGH
Sodium: 137

## 2011-05-17 LAB — HEMOGLOBIN AND HEMATOCRIT, BLOOD: HCT: 47.7

## 2011-05-17 LAB — PROTIME-INR: Prothrombin Time: 13.4

## 2012-06-28 ENCOUNTER — Encounter (HOSPITAL_COMMUNITY): Payer: Self-pay | Admitting: Emergency Medicine

## 2012-06-28 ENCOUNTER — Emergency Department (HOSPITAL_COMMUNITY)
Admission: EM | Admit: 2012-06-28 | Discharge: 2012-06-28 | Disposition: A | Discharge status: Home / Self Care | Payer: Self-pay | Attending: Emergency Medicine | Admitting: Emergency Medicine

## 2012-06-28 DIAGNOSIS — E119 Type 2 diabetes mellitus without complications: Secondary | ICD-10-CM | POA: Insufficient documentation

## 2012-06-28 DIAGNOSIS — S0500XA Injury of conjunctiva and corneal abrasion without foreign body, unspecified eye, initial encounter: Secondary | ICD-10-CM

## 2012-06-28 DIAGNOSIS — I10 Essential (primary) hypertension: Secondary | ICD-10-CM | POA: Insufficient documentation

## 2012-06-28 DIAGNOSIS — Z79899 Other long term (current) drug therapy: Secondary | ICD-10-CM | POA: Insufficient documentation

## 2012-06-28 DIAGNOSIS — Y939 Activity, unspecified: Secondary | ICD-10-CM | POA: Insufficient documentation

## 2012-06-28 DIAGNOSIS — Z7982 Long term (current) use of aspirin: Secondary | ICD-10-CM | POA: Insufficient documentation

## 2012-06-28 DIAGNOSIS — S058X9A Other injuries of unspecified eye and orbit, initial encounter: Secondary | ICD-10-CM | POA: Insufficient documentation

## 2012-06-28 DIAGNOSIS — X58XXXA Exposure to other specified factors, initial encounter: Secondary | ICD-10-CM | POA: Insufficient documentation

## 2012-06-28 DIAGNOSIS — Y929 Unspecified place or not applicable: Secondary | ICD-10-CM | POA: Insufficient documentation

## 2012-06-28 HISTORY — DX: Essential (primary) hypertension: I10

## 2012-06-28 HISTORY — DX: Type 2 diabetes mellitus without complications: E11.9

## 2012-06-28 MED ORDER — KETOROLAC TROMETHAMINE 0.5 % OP SOLN: 1.0000 [drp] | Freq: Four times a day (QID) | OPHTHALMIC | Status: DC

## 2012-06-28 MED ORDER — ERYTHROMYCIN 5 MG/GM OP OINT: TOPICAL_OINTMENT | OPHTHALMIC | Status: DC

## 2012-06-28 NOTE — ED Notes (Signed)
Pt states approx 3 weeks ago, he was cutting down branches and one poked him in the left eye. Pt states he initially had no pain or discomfort but over the past few days, he has begun to fell like something is in his eye.  Pt states the discomfort comes and goes and he feels like something is moving around. Pt denies blurred vision or vision changes. Pt states he has tried flushing his eyes multiple times without relief of symptoms.

## 2012-06-28 NOTE — ED Provider Notes (Signed)
History     CSN: [id number]  Arrival date & time 06/28/12  1478   First MD Initiated Contact with Patient 06/28/12 0945      Chief Complaint  Patient presents with  . Eye Pain    (Consider location/radiation/quality/duration/timing/severity/associated sxs/prior treatment) HPI  Patient to the ER for eye pain and feeling like something is in his eye. He had been messing with wood when it poked him a few weeks ago. He felt like his symptoms had gone away and the two days ago he felt it again. He flushed out his eye with copious amount of water. He denies being unable to see or having blurry vision. He denies any other trauma to the eye or a history of eye problems. He says his eye is red and irritated.  Past Medical History  Diagnosis Date  . Diabetes mellitus without complication   . Hypertension     History reviewed. No pertinent past surgical history.  History reviewed. No pertinent family history.  History  Substance Use Topics  . Smoking status: Never Smoker   . Smokeless tobacco: Not on file  . Alcohol Use: No      Review of Systems  Review of Systems  Gen: no weight loss, fevers, chills, night sweats  Eyes: no discharge or drainage,  or visual changes. + occular pain and redness Neuro: no headache, no focal neurologic deficits  Skin: no abnormalities Psyche: negative.   Allergies  Review of patient's allergies indicates no known allergies.  Home Medications   Current Outpatient Rx  Name  Route  Sig  Dispense  Refill  . AMLODIPINE BESYLATE 10 MG PO TABS   Oral   Take 10 mg by mouth daily.         . ASPIRIN EC 81 MG PO TBEC   Oral   Take 81 mg by mouth daily.         Marland Kitchen BENAZEPRIL HCL 40 MG PO TABS   Oral   Take 40 mg by mouth daily.         Marland Kitchen GLIPIZIDE ER 5 MG PO TB24   Oral   Take 5 mg by mouth daily.         Marland Kitchen HYDROCHLOROTHIAZIDE 25 MG PO TABS   Oral   Take 25 mg by mouth daily.         Marland Kitchen SIMVASTATIN 10 MG PO TABS   Oral  Take 10 mg by mouth daily.           BP 127/71  Pulse 78  Temp 97.7 F (36.5 C) (Oral)  Resp 18  SpO2 99%  Physical Exam  Nursing note and vitals reviewed. Constitutional: He appears well-developed and well-nourished. No distress.  HENT:  Head: Normocephalic and atraumatic.  Eyes: Pupils are equal, round, and reactive to light. Left eye exhibits no exudate. No foreign body present in the left eye. Left conjunctiva is injected. Left conjunctiva has no hemorrhage. Left eye exhibits normal extraocular motion.    Neck: Normal range of motion. Neck supple.  Cardiovascular: Normal rate and regular rhythm.   Pulmonary/Chest: Effort normal.  Abdominal: Soft.  Neurological: He is alert.  Skin: Skin is warm and dry.    ED Course  Procedures (including critical care time)  Labs Reviewed - No data to display No results found.   No diagnosis found.  Dx; Corneal abrasion  MDM  Eye irrigated and thoroughly examined. NO foreign bodies found on examination or cotton swab of the eye.  Visual acuity is unchanged. flurascein stain did not show any foreign bodies but it did show corneal abrasion.  Will Rx erythromycin ointment and ketorolac drops. As well as eye doctor referral.   Pt has been advised of the symptoms that warrant their return to the ED. Patient has voiced understanding and has agreed to follow-up with the PCP or specialist.   Dorthula Matas, PA 06/28/12 1011

## 2012-06-29 NOTE — ED Provider Notes (Signed)
Medical screening examination/treatment/procedure(s) were performed by non-physician practitioner and as supervising physician I was immediately available for consultation/collaboration.  Amerie Beaumont, MD 06/29/12 1525 

## 2014-09-05 ENCOUNTER — Encounter (HOSPITAL_COMMUNITY): Payer: Self-pay | Admitting: Emergency Medicine

## 2014-09-05 ENCOUNTER — Emergency Department (HOSPITAL_COMMUNITY)
Admission: EM | Admit: 2014-09-05 | Discharge: 2014-09-06 | Disposition: A | Discharge status: Home / Self Care | Payer: Self-pay | Attending: Emergency Medicine | Admitting: Emergency Medicine

## 2014-09-05 ENCOUNTER — Emergency Department (HOSPITAL_COMMUNITY): Payer: Self-pay

## 2014-09-05 DIAGNOSIS — F419 Anxiety disorder, unspecified: Secondary | ICD-10-CM | POA: Insufficient documentation

## 2014-09-05 DIAGNOSIS — I10 Essential (primary) hypertension: Secondary | ICD-10-CM | POA: Insufficient documentation

## 2014-09-05 DIAGNOSIS — Z5900 Homelessness unspecified: Secondary | ICD-10-CM

## 2014-09-05 DIAGNOSIS — K5900 Constipation, unspecified: Secondary | ICD-10-CM | POA: Insufficient documentation

## 2014-09-05 DIAGNOSIS — R059 Cough, unspecified: Secondary | ICD-10-CM

## 2014-09-05 DIAGNOSIS — E118 Type 2 diabetes mellitus with unspecified complications: Secondary | ICD-10-CM

## 2014-09-05 DIAGNOSIS — E119 Type 2 diabetes mellitus without complications: Secondary | ICD-10-CM | POA: Insufficient documentation

## 2014-09-05 DIAGNOSIS — R05 Cough: Secondary | ICD-10-CM

## 2014-09-05 DIAGNOSIS — Z7982 Long term (current) use of aspirin: Secondary | ICD-10-CM | POA: Insufficient documentation

## 2014-09-05 DIAGNOSIS — Z79899 Other long term (current) drug therapy: Secondary | ICD-10-CM | POA: Insufficient documentation

## 2014-09-05 LAB — BASIC METABOLIC PANEL
Anion gap: 5 (ref 5–15)
BUN: 18 mg/dL (ref 6–23)
CALCIUM: 8.9 mg/dL (ref 8.4–10.5)
CO2: 29 mmol/L (ref 19–32)
CREATININE: 1.26 mg/dL (ref 0.50–1.35)
Chloride: 107 mmol/L (ref 96–112)
GFR calc Af Amer: 74 mL/min — ABNORMAL LOW (ref >90)
GFR, EST NON AFRICAN AMERICAN: 64 mL/min — AB (ref >90)
Glucose, Bld: 97 mg/dL (ref 70–99)
Potassium: 3.9 mmol/L (ref 3.5–5.1)
SODIUM: 141 mmol/L (ref 135–145)

## 2014-09-05 LAB — URINALYSIS, ROUTINE W REFLEX MICROSCOPIC
Glucose, UA: NEGATIVE mg/dL
Hgb urine dipstick: NEGATIVE
KETONES UR: NEGATIVE mg/dL
Leukocytes, UA: NEGATIVE
Nitrite: NEGATIVE
Protein, ur: 30 mg/dL — AB
Specific Gravity, Urine: 1.031 — ABNORMAL HIGH (ref 1.005–1.030)
Urobilinogen, UA: 1 mg/dL (ref 0.0–1.0)
pH: 5.5 (ref 5.0–8.0)

## 2014-09-05 LAB — CBC
HEMATOCRIT: 47.8 % (ref 39.0–52.0)
Hemoglobin: 15.6 g/dL (ref 13.0–17.0)
MCH: 30.5 pg (ref 26.0–34.0)
MCHC: 32.6 g/dL (ref 30.0–36.0)
MCV: 93.4 fL (ref 78.0–100.0)
Platelets: 184 10*3/uL (ref 150–400)
RBC: 5.12 MIL/uL (ref 4.22–5.81)
RDW: 12.7 % (ref 11.5–15.5)
WBC: 4.5 10*3/uL (ref 4.0–10.5)

## 2014-09-05 LAB — URINE MICROSCOPIC-ADD ON

## 2014-09-05 LAB — CBG MONITORING, ED: GLUCOSE-CAPILLARY: 101 mg/dL — AB (ref 70–99)

## 2014-09-05 MED ORDER — ONDANSETRON 4 MG PO TBDP
4.0000 mg | ORAL_TABLET | Freq: Once | ORAL | Status: DC
  Filled 2014-09-05: qty 1

## 2014-09-05 MED ORDER — POLYETHYLENE GLYCOL 3350 17 GM/SCOOP PO POWD
1.0000 | Freq: Once | ORAL | Status: AC
  Administered 2014-09-05: 255 g via ORAL
  Filled 2014-09-05: qty 255

## 2014-09-05 MED ORDER — FLEET ENEMA 7-19 GM/118ML RE ENEM: 1.0000 | ENEMA | Freq: Every day | RECTAL | Status: DC | PRN

## 2014-09-05 NOTE — ED Notes (Signed)
Pt in by ems, kicked off [employer] property twice then asked security guard to call ems because he diabetes is "acting up." Also c/o back pain, kidney pain, insomnia, constipation. CBG with ems was 117. BP 160/90, p80, 98% RA. Has not had meds "for a long time" per pt.

## 2014-09-05 NOTE — Discharge Instructions (Signed)
Constipation Constipation is when a person:  Poops (has a bowel movement) less than 3 times a week.  Has a hard time pooping.  Has poop that is dry, hard, or bigger than normal. HOME CARE   Eat foods with a lot of fiber in them. This includes fruits, vegetables, beans, and whole grains such as brown rice.  Avoid fatty foods and foods with a lot of sugar. This includes french fries, hamburgers, cookies, candy, and soda.  If you are not getting enough fiber from food, take products with added fiber in them (supplements).  Drink enough fluid to keep your pee (urine) clear or pale yellow.  Exercise on a regular basis, or as told by your doctor.  Go to the restroom when you feel like you need to poop. Do not hold it.  Only take medicine as told by your doctor. Do not take medicines that help you poop (laxatives) without talking to your doctor first. GET HELP RIGHT AWAY IF:   You have bright red blood in your poop (stool).  Your constipation lasts more than 4 days or gets worse.  You have belly (abdominal) or butt (rectal) pain.  You have thin poop (as thin as a pencil).  You lose weight, and it cannot be explained. MAKE SURE YOU:   Understand these instructions.  Will watch your condition.  Will get help right away if you are not doing well or get worse. Document Released: 01/11/2008 Document Revised: 07/30/2013 Document Reviewed: 05/06/2013 [facility] Patient Information 2015 [place], [place]. This information is not intended to replace advice given to you by your health care provider. Make sure you discuss any questions you have with your health care provider. Vegetarian Eating and Nutrition Vegetarian diets are designed for those people who prefer vegetarian eating for religious, ecologic, or personal reasons. These diets, which are often lower in cholesterol and saturated fats, can provide significant health benefits. They result in lower rates of obesity, diabetes, breast and  colon cancers, and cardiovascular and gallbladder diseases.  There are several types of vegetarian diets, but all restrict animal proteins to some degree. Because animal proteins have important nutrients, vegetarians should make sure to get these nutrients from other types of foods. The main purpose of healthy vegetarian eating is to provide the energy, vitamins, and minerals for proper growth and health maintenance at any age.  WHAT DO I NEED TO KNOW ABOUT VEGETARIAN EATING AND NUTRITION? The following nutrients are found in animal products. It is important to make sure you get enough of these nutrients from your diet. If you think you are not getting the right nutrients or if you do not eat any animal products, talk with your health care provider or dietitian about taking supplements. Protein Your dietitian can help you determine your individual protein needs. Sources of protein include: Beans, such as black beans or kidney beans, or other legumes, such as lentils and split peas. Soy products. Nuts, such as almonds, [place] nuts, and pecans. Seeds, such as sunflower seeds. Tofu, tempeh, and hummus. Eggs, milk, and cheese. Vitamin B12 This vitamin is only found in: Cheeses, fish, and eggs. It can also be found in some breakfast cereals and other prepared products that have added vitamin B12. If you eat no animal products, you should discuss taking a supplement with your health care provider or dietitian. Vitamin D Good sources of vitamin D include: Cod liver oil. Fish, such as swordfish, salmon, and tuna. Dairy products. Orange juice. Fortified mushrooms. Cereals with  added vitamin D. Iron Good sources of iron include: Dark, leafy greens. Nuts. Beans. Grain products that have added iron, such as cereals. Tofu, tempeh, soybeans, and quinoa. Plant-based iron is better absorbed when eaten with vitamin C. For example, squeeze lemon juice over cooked greens like kale, chard, or spinach, or have a glass of  orange juice with your meals. Omega-3 Fatty Acids Good sources of omega-3 fatty acids include: Walnuts. Foods with added omega-3 fatty acids, such as eggs, milk, and juices. Flax seeds, canola oil, soybean oil, and tofu. Fish (cold water fatty fish such as salmon, sardines, mackerel, herring, lake trout, and albacore tuna). Calcium Good sources of calcium include: Dark, leafy greens, such as kale, bok choy, Chinese cabbage, collard greens and mustard greens. Broccoli. Okra. Dairy products. Soy products with added calcium. Calcium-fortified breakfast cereals, calcium-fortified fruit juices, and figs. Zinc Good sources of zinc include: Legumes. Dairy products. Wheat germ, cereals, and breads that have added zinc. Baked beans. Legumes, such as cashews, chickpeas, kidney beans, and green peas. Almonds and nut butters. Tofu and other soy products. Document Released: 07/28/2003 Document Revised: 12/09/2013 Document Reviewed: 05/30/2013 [facility] Patient Information 2015 [place], [place]. This information is not intended to replace advice given to you by your health care provider. Make sure you discuss any questions you have with your health care provider.

## 2014-09-05 NOTE — ED Notes (Signed)
Patient presents with lower back pain and is "concerned about my kidneys." Also c/o abdominal pain and constipation. Reports, "when I go I only go little bits at a time." Unsure of last normal BM. Hx DM-takes Glipizide. Also c/o sinus pressure when he sleeps. Reports having an anxiety attack today and didn't take his Glipizide but "ate lots of sugar." Hasn't had Glipizide in 2 months. No other issues/complaints.

## 2014-09-05 NOTE — ED Provider Notes (Signed)
CSN: [id number]     Arrival date & time 09/05/14  1737 History   First MD Initiated Contact with Patient 09/05/14 2019     Chief Complaint  Patient presents with  . Sinus pressure   . Homeless  . Back Pain  . Diabetes     (Consider location/radiation/quality/duration/timing/severity/associated sxs/prior Treatment) HPI   PCP: MULBERRY,ELIZABETH, MD Blood pressure 137/80, pulse 66, temperature 97.9 F (36.6 C), temperature source Oral, resp. rate 18, SpO2 98 %.  Javier Rivera is a 54 y.o.male with a significant PMH of diabetes, and hypertension presents to the ER with multiple complaints by EMS. He was at [facility] and asked to leave more than 3 times - security called [employer]/EMS. When they arrived patient reports he is constipated, homeless, having "diabetes" problems, sinus pressure and wants his kidneys checked. He informs me that he has been eating anything he can get his hands on and was concerned that his sugar may be abnormal, he has not been taking his medications because when he drank GoLytely last month it made him sick; he tried to chug the whole jug in one setting.  He passes stool but only small amounts and admits not having a lot of water or fiber in his diet. He also reports having anxiety attack and insomnia. Denies HI/SI, hallucinations, substance abuse problems or ETOH addiction.   Past Medical History  Diagnosis Date  . Diabetes mellitus without complication   . Hypertension    History reviewed. No pertinent past surgical history. History reviewed. No pertinent family history. History  Substance Use Topics  . Smoking status: Never Smoker   . Smokeless tobacco: Not on file  . Alcohol Use: No    Review of Systems  10 Systems reviewed and are negative for acute change except as noted in the HPI.     Allergies  Review of patient's allergies indicates no known allergies.  Home Medications   Prior to Admission medications   Medication Sig Start Date End Date  Taking? Authorizing Provider  amLODipine (NORVASC) 10 MG tablet Take 10 mg by mouth daily.   Yes Historical Provider, MD  aspirin EC 81 MG tablet Take 81 mg by mouth daily.   Yes Historical Provider, MD  benazepril (LOTENSIN) 40 MG tablet Take 40 mg by mouth daily.   Yes Historical Provider, MD  glipiZIDE (GLUCOTROL XL) 5 MG 24 hr tablet Take 5 mg by mouth daily.   Yes Historical Provider, MD  hydrochlorothiazide (HYDRODIURIL) 25 MG tablet Take 25 mg by mouth daily.   Yes Historical Provider, MD  simvastatin (ZOCOR) 10 MG tablet Take 10 mg by mouth daily.   Yes Historical Provider, MD  erythromycin ophthalmic ointment Place a 1/2 inch ribbon of ointment into the lower eyelid. 06/28/12   Seena Face Marilu Favre, PA-C  ketorolac (ACULAR) 0.5 % ophthalmic solution Place 1 drop into the left eye every 6 (six) hours. 06/28/12   Karsten Vaughn Marilu Favre, PA-C  lisinopril (PRINIVIL,ZESTRIL) 40 MG tablet Take 40 mg by mouth daily.    Historical Provider, MD  sodium phosphate (FLEET) 7-19 GM/118ML ENEM Place 133 mLs (1 enema total) rectally daily as needed for severe constipation. 09/05/14   Curtisha Bendix Marilu Favre, PA-C   BP 137/80 mmHg  Pulse 66  Temp(Src) 97.9 F (36.6 C) (Oral)  Resp 18  SpO2 98% Physical Exam  Constitutional: He appears well-developed and well-nourished. No distress.  HENT:  Head: Normocephalic and atraumatic.  Eyes: Pupils are equal, round, and reactive to light.  Neck: Normal range of motion. Neck supple.  Cardiovascular: Normal rate and regular rhythm.   Pulmonary/Chest: Effort normal. No respiratory distress. He has no wheezes.  Abdominal: Soft. Bowel sounds are normal. He exhibits distension. There is no tenderness (mildly). There is no rigidity, no rebound and no guarding.  Musculoskeletal:       Cervical back: Normal.       Thoracic back: Normal.       Lumbar back: Normal.  Lymphadenopathy:  + swollen inguinal lymph node that is smooth, mobile and non tender  Neurological: He is alert.   Skin: Skin is warm and dry.  Psychiatric: His speech is normal and behavior is normal. Thought content normal. His mood appears anxious. He expresses no homicidal and no suicidal ideation.  Nursing note and vitals reviewed.   ED Course  Procedures (including critical care time) Labs Review Labs Reviewed  BASIC METABOLIC PANEL - Abnormal; Notable for the following:    GFR calc non Af Amer 64 (*)    GFR calc Af Amer 74 (*)    All other components within normal limits  URINALYSIS, ROUTINE W REFLEX MICROSCOPIC - Abnormal; Notable for the following:    Color, Urine AMBER (*)    Specific Gravity, Urine 1.031 (*)    Bilirubin Urine SMALL (*)    Protein, ur 30 (*)    All other components within normal limits  URINE MICROSCOPIC-ADD ON - Abnormal; Notable for the following:    Casts GRANULAR CAST (*)    All other components within normal limits  CBG MONITORING, ED - Abnormal; Notable for the following:    Glucose-Capillary 101 (*)    All other components within normal limits  CBC    Imaging Review Dg Chest 2 View  09/05/2014   CLINICAL DATA:  Patient complains of diabetes acting up. Back pain, kidney pain, insomnia, and constipation.  EXAM: CHEST  2 VIEW  COMPARISON:  05/31/2009  FINDINGS: Normal heart size and pulmonary vascularity. No focal airspace disease or consolidation in the lungs. No blunting of costophrenic angles. No pneumothorax. Mediastinal contours appear intact. Mild degenerative changes in the spine.  IMPRESSION: No active cardiopulmonary disease.   Electronically Signed   By: Lucienne Capers M.D.   On: 09/05/2014 22:53   Dg Abd 2 Views  09/05/2014   CLINICAL DATA:  Back pain, kidney pain, insomnia, constipation  EXAM: ABDOMEN - 2 VIEW  COMPARISON:  None.  FINDINGS: The bowel gas pattern is normal. There is a moderate amount of stool throughout the colon. There is no evidence of free air. No radio-opaque calculi or other significant radiographic abnormality is seen.   IMPRESSION: Moderate amount of stool throughout the colon.   Electronically Signed   By: Kathreen Devoid   On: 09/05/2014 22:53     EKG Interpretation None      MDM   Final diagnoses:  Constipation  Cough  Type 2 diabetes mellitus with complication  Homelessness    Patient has a normal physical exam and his labs are reassuring. He was given a diet in the ER, without vomiting, given Mira lax. Discussed taking his diabetes medications which he has and following up with his primary care doctor for recheck,  His lab show some dehydration. Normal chest xray.  55 y.o.Javier Rivera's evaluation in the Emergency Department is complete. It has been determined that no acute conditions requiring further emergency intervention are present at this time. The patient/guardian have been advised of the diagnosis and plan. We  have discussed signs and symptoms that warrant return to the ED, such as changes or worsening in symptoms.  Vital signs are stable at discharge. Filed Vitals:   09/05/14 2053  BP: 137/80  Pulse: 66  Temp:   Resp: 18    Patient/guardian has voiced understanding and agreed to follow-up with the PCP or specialist.     Linus Mako, PA-C 09/05/14 2319  Dorie Rank, MD 09/06/14 [phone]

## 2014-09-06 MED ORDER — GUAIFENESIN 100 MG/5ML PO SOLN: 5.0000 mL | ORAL | Status: DC | PRN

## 2015-01-01 ENCOUNTER — Encounter (HOSPITAL_COMMUNITY): Payer: Self-pay | Admitting: Emergency Medicine

## 2015-01-01 ENCOUNTER — Emergency Department (HOSPITAL_COMMUNITY)
Admission: EM | Admit: 2015-01-01 | Discharge: 2015-01-01 | Disposition: A | Discharge status: Home / Self Care | Payer: Self-pay | Attending: Emergency Medicine | Admitting: Emergency Medicine

## 2015-01-01 ENCOUNTER — Emergency Department (HOSPITAL_COMMUNITY): Payer: Self-pay

## 2015-01-01 DIAGNOSIS — Z720 Tobacco use: Secondary | ICD-10-CM | POA: Insufficient documentation

## 2015-01-01 DIAGNOSIS — I10 Essential (primary) hypertension: Secondary | ICD-10-CM | POA: Insufficient documentation

## 2015-01-01 DIAGNOSIS — Z7982 Long term (current) use of aspirin: Secondary | ICD-10-CM | POA: Insufficient documentation

## 2015-01-01 DIAGNOSIS — A64 Unspecified sexually transmitted disease: Secondary | ICD-10-CM | POA: Insufficient documentation

## 2015-01-01 DIAGNOSIS — E119 Type 2 diabetes mellitus without complications: Secondary | ICD-10-CM | POA: Insufficient documentation

## 2015-01-01 DIAGNOSIS — R52 Pain, unspecified: Secondary | ICD-10-CM

## 2015-01-01 DIAGNOSIS — Z79899 Other long term (current) drug therapy: Secondary | ICD-10-CM | POA: Insufficient documentation

## 2015-01-01 DIAGNOSIS — Z87448 Personal history of other diseases of urinary system: Secondary | ICD-10-CM | POA: Insufficient documentation

## 2015-01-01 HISTORY — DX: Disorder of kidney and ureter, unspecified: N28.9

## 2015-01-01 LAB — CBG MONITORING, ED: Glucose-Capillary: 99 mg/dL (ref 65–99)

## 2015-01-01 MED ORDER — METRONIDAZOLE 500 MG PO TABS: 500.0000 mg | ORAL_TABLET | Freq: Two times a day (BID) | ORAL | Status: DC

## 2015-01-01 MED ORDER — CEFTRIAXONE SODIUM 250 MG IJ SOLR
250.0000 mg | Freq: Once | INTRAMUSCULAR | Status: AC
  Administered 2015-01-01: 250 mg via INTRAMUSCULAR
  Filled 2015-01-01: qty 250

## 2015-01-01 MED ORDER — AZITHROMYCIN 250 MG PO TABS
1000.0000 mg | ORAL_TABLET | Freq: Once | ORAL | Status: AC
Start: 2015-01-01 — End: 2015-01-01
  Administered 2015-01-01: 1000 mg via ORAL
  Filled 2015-01-01: qty 4

## 2015-01-01 MED ORDER — SULFAMETHOXAZOLE-TRIMETHOPRIM 800-160 MG PO TABS: 1.0000 | ORAL_TABLET | Freq: Once | ORAL | Status: DC

## 2015-01-01 MED ORDER — LIDOCAINE HCL 1 % IJ SOLN
INTRAMUSCULAR | Status: AC
  Administered 2015-01-01: 1.5 mL
  Filled 2015-01-01: qty 20

## 2015-01-01 MED ORDER — OXYCODONE-ACETAMINOPHEN 5-325 MG PO TABS: 2.0000 | ORAL_TABLET | Freq: Once | ORAL | Status: DC

## 2015-01-01 NOTE — Discharge Instructions (Signed)
Sexually Transmitted Disease Follow up with your primary care provider and take antibiotics as prescribed. Avoid sexual intercourse for 6 weeks. Do not drink alcohol until 24 hours after last dose of medication.  A sexually transmitted disease (STD) is a disease or infection often passed to another person during sex. However, STDs can be passed through nonsexual ways. An STD can be passed through:  Spit (saliva).  Semen.  Blood.  Mucus from the vagina.  Pee (urine). HOW CAN I LESSEN MY CHANCES OF GETTING AN STD?  Use:  Latex condoms.  Water-soluble lubricants with condoms. Do not use petroleum jelly or oils.  Dental dams. These are small pieces of latex that are used as a barrier during oral sex.  Avoid having more than one sex partner.  Do not have sex with someone who has other sex partners.  Do not have sex with anyone you do not know or who is at high risk for an STD.  Avoid risky sex that can break your skin.  Do not have sex if you have open sores on your mouth or skin.  Avoid drinking too much alcohol or taking illegal drugs. Alcohol and drugs can affect your good judgment.  Avoid oral and anal sex acts.  Get shots (vaccines) for HPV and hepatitis.  If you are at risk of being infected with HIV, it is advised that you take a certain medicine daily to prevent HIV infection. This is called pre-exposure prophylaxis (PrEP). You may be at risk if:  You are a man who has sex with other men (MSM).  You are attracted to the opposite sex (heterosexual) and are having sex with more than one partner.  You take drugs with a needle.  You have sex with someone who has HIV.  Talk with your doctor about if you are at high risk of being infected with HIV. If you begin to take PrEP, get tested for HIV first. Get tested every 3 months for as long as you are taking PrEP. WHAT SHOULD I DO IF I THINK I HAVE AN STD?  See your doctor.  Tell your sex partner(s) that you have an  STD. They should be tested and treated.  Do not have sex until your doctor says it is okay. WHEN SHOULD I GET HELP? Get help right away if:  You have bad belly (abdominal) pain.  You are a man and have puffiness (swelling) or pain in your testicles.  You are a woman and have puffiness in your vagina. Document Released: 09/01/2004 Document Revised: 07/30/2013 Document Reviewed: 01/18/2013 [facility] Patient Information 2015 [place], [place]. This information is not intended to replace advice given to you by your health care provider. Make sure you discuss any questions you have with your health care provider.

## 2015-01-01 NOTE — ED Notes (Signed)
Pt reports facial pressure, ear "popping", and nasal congestion. Also c/o constipation, laxatives not working. Reports chronic tingling sensations in his arms. Pt stated that is "not taking any medications" and eating poorly. Pt is alert and cooperative

## 2015-01-01 NOTE — ED Notes (Signed)
Bed: WTR5 Expected date:  Expected time:  Means of arrival:  Comments: EMS- muscle pain, not taking meds

## 2015-01-01 NOTE — ED Provider Notes (Signed)
CSN: [id number]     Arrival date & time 01/01/15  1017 History   First MD Initiated Contact with Patient 01/01/15 1026     Chief Complaint  Patient presents with  . Generalized Body Aches    chronic body aches     (Consider location/radiation/quality/duration/timing/severity/associated sxs/prior Treatment) HPI Javier Rivera is a 54 year old male with a history of diabetes and hypertension who presents by EMS to the ED with multiple complaints including generalized body aches and tingling throughout his extremities both upper and lower. He states that he feels like he may have pneumonia or fluid in his lungs. He states that when he wakes up in the morning he feels like it is hard to breathe. Patient also complains of clear discharge from the penis for the last couple of days. He states he has not taken his medications for diabetes and months. He is currently homeless but does not want social work help. He denies fever, chills, cough, hemoptysis, chest pain, shortness of breath, abdominal pain, nausea, vomiting, diarrhea, dysuria, hematuria, urinary frequency, leg swelling. He denies sexual activity or multiple sexual partners. He admits to a previous STD but does not know which one.  Past Medical History  Diagnosis Date  . Diabetes mellitus without complication   . Hypertension   . Renal disorder     pt stated that masses were found on his kidneys   Past Surgical History  Procedure Laterality Date  . Knee surgery      bilateral   No family history on file. History  Substance Use Topics  . Smoking status: Current Every Day Smoker    Types: Cigarettes  . Smokeless tobacco: Not on file  . Alcohol Use: No    Review of Systems  Constitutional: Negative for fever and chills.  HENT: Negative for trouble swallowing.   Respiratory: Negative for choking, chest tightness and wheezing.   Gastrointestinal: Negative for nausea, vomiting and abdominal pain.  Genitourinary: Positive for  discharge. Negative for dysuria, urgency, hematuria, penile swelling, scrotal swelling, difficulty urinating, penile pain and testicular pain.      Allergies  Review of patient's allergies indicates no known allergies.  Home Medications   Prior to Admission medications   Medication Sig Start Date End Date Taking? Authorizing Provider  amLODipine (NORVASC) 10 MG tablet Take 10 mg by mouth daily.    Historical Provider, MD  aspirin EC 81 MG tablet Take 81 mg by mouth daily.    Historical Provider, MD  benazepril (LOTENSIN) 40 MG tablet Take 40 mg by mouth daily.    Historical Provider, MD  erythromycin ophthalmic ointment Place a 1/2 inch ribbon of ointment into the lower eyelid. 06/28/12   Tiffany Carlota Raspberry, PA-C  glipiZIDE (GLUCOTROL XL) 5 MG 24 hr tablet Take 5 mg by mouth daily.    Historical Provider, MD  guaiFENesin (ROBITUSSIN) 100 MG/5ML SOLN Take 5 mLs (100 mg total) by mouth every 4 (four) hours as needed for cough or to loosen phlegm. 09/06/14   Tiffany Carlota Raspberry, PA-C  hydrochlorothiazide (HYDRODIURIL) 25 MG tablet Take 25 mg by mouth daily.    Historical Provider, MD  ketorolac (ACULAR) 0.5 % ophthalmic solution Place 1 drop into the left eye every 6 (six) hours. 06/28/12   Tiffany Carlota Raspberry, PA-C  lisinopril (PRINIVIL,ZESTRIL) 40 MG tablet Take 40 mg by mouth daily.    Historical Provider, MD  metroNIDAZOLE (FLAGYL) 500 MG tablet Take 1 tablet (500 mg total) by mouth 2 (two) times daily. 01/01/15   Orvil Feil  Patel-Mills, PA-C  simvastatin (ZOCOR) 10 MG tablet Take 10 mg by mouth daily.    Historical Provider, MD  sodium phosphate (FLEET) 7-19 GM/118ML ENEM Place 133 mLs (1 enema total) rectally daily as needed for severe constipation. 09/05/14   Tiffany Carlota Raspberry, PA-C   BP 156/99 mmHg  Pulse 56  Temp(Src) 97.7 F (36.5 C) (Oral)  Resp 20  Wt 208 lb (94.348 kg)  SpO2 100% Physical Exam  Constitutional: He is oriented to person, place, and time. He appears well-developed and well-nourished.   HENT:  Head: Normocephalic.  Eyes: Conjunctivae are normal.  Neck: Neck supple.  Cardiovascular: Normal rate, regular rhythm and normal heart sounds.   Pulmonary/Chest: Effort normal and breath sounds normal. No respiratory distress. He has no wheezes. He has no rales.  Abdominal: Soft.  Genitourinary: Circumcised.  Chaperone present. Clear penile discharge. No genital warts noted.  Musculoskeletal: Normal range of motion. He exhibits no edema.  Neurological: He is alert and oriented to person, place, and time.  Skin: Skin is warm and dry.    ED Course  Procedures (including critical care time) Labs Review Labs Reviewed  CBG MONITORING, ED  GC/CHLAMYDIA PROBE AMP ([place]) NOT AT [facility]    Imaging Review Dg Chest 2 View  01/01/2015   CLINICAL DATA:  Generalized body ache, weakness, congestion, on all occasional smoker  EXAM: CHEST  2 VIEW  COMPARISON:  09/05/2014  FINDINGS: The heart size and mediastinal contours are within normal limits. Both lungs are clear. Mild degenerative changes mid thoracic spine.  IMPRESSION: No active cardiopulmonary disease.   Electronically Signed   By: Lahoma Crocker M.D.   On: 01/01/2015 11:38     EKG Interpretation None      MDM   Final diagnoses:  Body aches  STD (male)   Patient with history of diabetes and hypertension presents to the ED for multiple complaints. He states that it is difficult to breathe in the morning and he worries that he may have pneumonia or fluid in the lungs. He is noncompliant with diabetes medications for several months. He states that they make him sick. He has not followed up with a PCP regarding this matter. He refused social worker help for homelessness. He states, "I've been to shelters in places like that and I don't want to go back there." Patient also complains of clear discharge from the penis for the past few days.  CXR negative for pneumonia or edema. His vitals are stable. He is afebrile. CBG is 90.  I  will treat him for gonorrhea, chlamydia, trichomonas. I have given him ceftriaxone and azithromycin in the ED. I will send him home with metronidazole. I discussed that the hospital would call with GC/ chlamydia results in the next few days if it were positive. Patient agrees that he needs follow-up with his PCP for all medications.     Ottie Glazier, PA-C 01/01/15 [place], MD 01/01/15 [phone]

## 2015-01-01 NOTE — ED Notes (Signed)
PER EMS # 20-   [place] called EMS with pt c/o generalized, chronic body aches. Denies acute episode. Pt is a noncompliant, hypertensive diabetic. Pt is alert, orient appropriate and ambulatory.

## 2015-01-01 NOTE — ED Notes (Signed)
CBG- 99 

## 2015-01-02 LAB — GC/CHLAMYDIA PROBE AMP (~~LOC~~) NOT AT ARMC
Chlamydia: NEGATIVE
NEISSERIA GONORRHEA: NEGATIVE

## 2015-02-10 ENCOUNTER — Emergency Department (HOSPITAL_COMMUNITY): Payer: Self-pay

## 2015-02-10 ENCOUNTER — Emergency Department (HOSPITAL_COMMUNITY)
Admission: EM | Admit: 2015-02-10 | Discharge: 2015-02-10 | Disposition: A | Discharge status: Home / Self Care | Payer: Self-pay | Attending: Emergency Medicine | Admitting: Emergency Medicine

## 2015-02-10 ENCOUNTER — Encounter (HOSPITAL_COMMUNITY): Payer: Self-pay | Admitting: Emergency Medicine

## 2015-02-10 DIAGNOSIS — Z87448 Personal history of other diseases of urinary system: Secondary | ICD-10-CM | POA: Insufficient documentation

## 2015-02-10 DIAGNOSIS — K5909 Other constipation: Secondary | ICD-10-CM | POA: Insufficient documentation

## 2015-02-10 DIAGNOSIS — Z7982 Long term (current) use of aspirin: Secondary | ICD-10-CM | POA: Insufficient documentation

## 2015-02-10 DIAGNOSIS — Z79899 Other long term (current) drug therapy: Secondary | ICD-10-CM | POA: Insufficient documentation

## 2015-02-10 DIAGNOSIS — Z792 Long term (current) use of antibiotics: Secondary | ICD-10-CM | POA: Insufficient documentation

## 2015-02-10 DIAGNOSIS — K5904 Chronic idiopathic constipation: Secondary | ICD-10-CM

## 2015-02-10 DIAGNOSIS — I10 Essential (primary) hypertension: Secondary | ICD-10-CM | POA: Insufficient documentation

## 2015-02-10 DIAGNOSIS — R51 Headache: Secondary | ICD-10-CM | POA: Insufficient documentation

## 2015-02-10 DIAGNOSIS — E119 Type 2 diabetes mellitus without complications: Secondary | ICD-10-CM | POA: Insufficient documentation

## 2015-02-10 DIAGNOSIS — Z609 Problem related to social environment, unspecified: Secondary | ICD-10-CM | POA: Insufficient documentation

## 2015-02-10 DIAGNOSIS — Z720 Tobacco use: Secondary | ICD-10-CM | POA: Insufficient documentation

## 2015-02-10 DIAGNOSIS — Z659 Problem related to unspecified psychosocial circumstances: Secondary | ICD-10-CM

## 2015-02-10 LAB — URINE MICROSCOPIC-ADD ON

## 2015-02-10 LAB — COMPREHENSIVE METABOLIC PANEL
ALK PHOS: 87 U/L (ref 38–126)
ALT: 13 U/L — AB (ref 17–63)
AST: 13 U/L — AB (ref 15–41)
Albumin: 4.5 g/dL (ref 3.5–5.0)
Anion gap: 9 (ref 5–15)
BILIRUBIN TOTAL: 0.9 mg/dL (ref 0.3–1.2)
BUN: 12 mg/dL (ref 6–20)
CHLORIDE: 104 mmol/L (ref 101–111)
CO2: 26 mmol/L (ref 22–32)
CREATININE: 1.15 mg/dL (ref 0.61–1.24)
Calcium: 9.3 mg/dL (ref 8.9–10.3)
GFR calc Af Amer: >60 mL/min (ref >60)
Glucose, Bld: 98 mg/dL (ref 65–99)
POTASSIUM: 3.9 mmol/L (ref 3.5–5.1)
SODIUM: 139 mmol/L (ref 135–145)
Total Protein: 7.2 g/dL (ref 6.5–8.1)

## 2015-02-10 LAB — RAPID URINE DRUG SCREEN, HOSP PERFORMED
AMPHETAMINES: NOT DETECTED
BENZODIAZEPINES: NOT DETECTED
Barbiturates: NOT DETECTED
COCAINE: NOT DETECTED
Opiates: NOT DETECTED
TETRAHYDROCANNABINOL: NOT DETECTED

## 2015-02-10 LAB — URINALYSIS, ROUTINE W REFLEX MICROSCOPIC
Glucose, UA: NEGATIVE mg/dL
HGB URINE DIPSTICK: NEGATIVE
KETONES UR: 15 mg/dL — AB
Leukocytes, UA: NEGATIVE
Nitrite: NEGATIVE
PROTEIN: 30 mg/dL — AB
Specific Gravity, Urine: 1.024 (ref 1.005–1.030)
Urobilinogen, UA: 1 mg/dL (ref 0.0–1.0)
pH: 6 (ref 5.0–8.0)

## 2015-02-10 LAB — CBC WITH DIFFERENTIAL/PLATELET
BASOS PCT: 0 % (ref 0–1)
Basophils Absolute: 0 10*3/uL (ref 0.0–0.1)
Eosinophils Absolute: 0 10*3/uL (ref 0.0–0.7)
Eosinophils Relative: 0 % (ref 0–5)
HCT: 47 % (ref 39.0–52.0)
HEMOGLOBIN: 16 g/dL (ref 13.0–17.0)
Lymphocytes Relative: 26 % (ref 12–46)
Lymphs Abs: 0.8 10*3/uL (ref 0.7–4.0)
MCH: 30.4 pg (ref 26.0–34.0)
MCHC: 34 g/dL (ref 30.0–36.0)
MCV: 89.4 fL (ref 78.0–100.0)
MONOS PCT: 6 % (ref 3–12)
Monocytes Absolute: 0.2 10*3/uL (ref 0.1–1.0)
NEUTROS PCT: 68 % (ref 43–77)
Neutro Abs: 2.1 10*3/uL (ref 1.7–7.7)
Platelets: 151 10*3/uL (ref 150–400)
RBC: 5.26 MIL/uL (ref 4.22–5.81)
RDW: 13 % (ref 11.5–15.5)
WBC: 3.1 10*3/uL — ABNORMAL LOW (ref 4.0–10.5)

## 2015-02-10 MED ORDER — IBUPROFEN 200 MG PO TABS
600.0000 mg | ORAL_TABLET | Freq: Once | ORAL | Status: AC
  Administered 2015-02-10: 600 mg via ORAL
  Filled 2015-02-10: qty 3

## 2015-02-10 MED ORDER — BISACODYL 10 MG RE SUPP
20.0000 mg | Freq: Once | RECTAL | Status: AC
  Administered 2015-02-10: 20 mg via RECTAL
  Filled 2015-02-10: qty 2

## 2015-02-10 NOTE — ED Notes (Signed)
Per EMS pt c/o abdominal pain x 1 year, chronic constipation with last bowel movement 7 days ago, welfare and social issues, pt is homeless and feels overheated outside leading him to have difficulty sleeping and increasing his existing anxiety. Per EMS, pt is noncompliant with his medications. Pt denies diarrhea.

## 2015-02-10 NOTE — ED Provider Notes (Signed)
CSN: [id number]     Arrival date & time 02/10/15  7253 History   First MD Initiated Contact with Patient 02/10/15 0845     Chief Complaint  Patient presents with  . Abdominal Pain  . Social Problem      (Consider location/radiation/quality/duration/timing/severity/associated sxs/prior Treatment) HPI Comments: Pt. Is a 54 y/o Homeless Gentleman with a PMH of HTN, DMII, HLD, noncompliance, who is here complaining of abdominal pain and constipation in addition to it being "hot outside" and difficulty sleeping. He complains predominantly of constipation for 7 days with some abdominal pain that he says is "10/10". He denies vomiting, but complains of some nausea. He says he has had hard stools, and that he usually stools every day. He has not had blood in his stool. He says he has been intermittently constipated for more than one year. He has used miralax in the past as well as enemas with little help. He says he has not had a good diet mostly due to access to food. He says he has been eating at the "soup kitchen" , but otherwise has had little access to food. Otherwise, he says he has had little to drink as well even with it being so hot outside. He denies chest pain, back pain, dysuria, hematuria, weight loss. He denies dizziness, headache, palpitations. He admits to poor compliance. He continues to smoke daily 1/2 ppd.   The history is provided by the patient.    Past Medical History  Diagnosis Date  . Diabetes mellitus without complication   . Hypertension   . Renal disorder     pt stated that masses were found on his kidneys   Past Surgical History  Procedure Laterality Date  . Knee surgery      bilateral   History reviewed. No pertinent family history. History  Substance Use Topics  . Smoking status: Current Every Day Smoker    Types: Cigarettes  . Smokeless tobacco: Not on file  . Alcohol Use: No    Review of Systems  Constitutional: Negative for appetite change.  Respiratory:  Negative for chest tightness.   Cardiovascular: Negative for chest pain, palpitations and leg swelling.  Gastrointestinal: Positive for nausea, abdominal pain and constipation. Negative for vomiting, diarrhea, blood in stool, abdominal distention, anal bleeding and rectal pain.  Endocrine: Negative for cold intolerance, heat intolerance and polydipsia.  Genitourinary: Negative for dysuria, frequency and hematuria.  Musculoskeletal: Negative for back pain and neck pain.  Neurological: Positive for headaches. Negative for tremors and syncope.  Psychiatric/Behavioral: Negative for agitation.  All other systems reviewed and are negative.     Allergies  Review of patient's allergies indicates no known allergies.  Home Medications   Prior to Admission medications   Medication Sig Start Date End Date Taking? Authorizing Provider  amLODipine (NORVASC) 10 MG tablet Take 10 mg by mouth daily.    Historical Provider, MD  aspirin EC 81 MG tablet Take 81 mg by mouth daily.    Historical Provider, MD  benazepril (LOTENSIN) 40 MG tablet Take 40 mg by mouth daily.    Historical Provider, MD  erythromycin ophthalmic ointment Place a 1/2 inch ribbon of ointment into the lower eyelid. 06/28/12   Tiffany Carlota Raspberry, PA-C  glipiZIDE (GLUCOTROL XL) 5 MG 24 hr tablet Take 5 mg by mouth daily.    Historical Provider, MD  guaiFENesin (ROBITUSSIN) 100 MG/5ML SOLN Take 5 mLs (100 mg total) by mouth every 4 (four) hours as needed for cough or to loosen  phlegm. 09/06/14   Tiffany Carlota Raspberry, PA-C  hydrochlorothiazide (HYDRODIURIL) 25 MG tablet Take 25 mg by mouth daily.    Historical Provider, MD  ketorolac (ACULAR) 0.5 % ophthalmic solution Place 1 drop into the left eye every 6 (six) hours. 06/28/12   Tiffany Carlota Raspberry, PA-C  lisinopril (PRINIVIL,ZESTRIL) 40 MG tablet Take 40 mg by mouth daily.    Historical Provider, MD  metroNIDAZOLE (FLAGYL) 500 MG tablet Take 1 tablet (500 mg total) by mouth 2 (two) times daily. 01/01/15    Hanna Patel-Mills, PA-C  simvastatin (ZOCOR) 10 MG tablet Take 10 mg by mouth daily.    Historical Provider, MD  sodium phosphate (FLEET) 7-19 GM/118ML ENEM Place 133 mLs (1 enema total) rectally daily as needed for severe constipation. 09/05/14   Tiffany Carlota Raspberry, PA-C   BP 157/100 mmHg  Pulse 65  Temp(Src) 98 F (36.7 C) (Oral)  Resp 16  Wt 185 lb 3.2 oz (84.006 kg)  SpO2 100% Physical Exam  Constitutional: He is oriented to person, place, and time. He appears well-developed. No distress.  HENT:  Head: Normocephalic and atraumatic.  Mouth/Throat: Oropharynx is clear and moist.  Poor dentition   Eyes: Conjunctivae and EOM are normal. Pupils are equal, round, and reactive to light.  Neck: Normal range of motion. Neck supple.  Cardiovascular: Normal rate, regular rhythm, normal heart sounds and intact distal pulses.  Exam reveals no gallop and no friction rub.   No murmur heard. Pulmonary/Chest: Effort normal and breath sounds normal. No respiratory distress. He has no wheezes. He has no rales. He exhibits no tenderness.  Abdominal: Soft. Normal aorta and bowel sounds are normal. He exhibits no distension and no mass. There is no hepatosplenomegaly. There is generalized tenderness. There is no rebound, no guarding and no CVA tenderness.  Nontender with distraction, but tender when intentional exam performed.    Musculoskeletal: Normal range of motion. He exhibits no edema or tenderness.  Lymphadenopathy:    He has no cervical adenopathy.  Neurological: He is alert and oriented to person, place, and time.  Skin: Skin is warm and dry. No rash noted. No erythema.  Psychiatric: He has a normal mood and affect. His behavior is normal. Thought content normal.    ED Course  Procedures (including critical care time) Labs Review Labs Reviewed  BASIC METABOLIC PANEL  URINALYSIS, ROUTINE W REFLEX MICROSCOPIC (NOT AT [facility])  CBC WITH DIFFERENTIAL/PLATELET    Imaging Review No results  found.   EKG Interpretation None      MDM   Final diagnoses:  None    Pt. Here with what sounds like intermittent constipation and likely mild dehydration. Story ranges and is somewhat inconsistent. He is homeless and has contributing social issues as well. Noncompliant with regular medical therapy or follow up. Will get BEMT, KUB, CBC, U/A, trial of PO fluids. Consider fluid bolus. Motrin for headache.   10:17 am - KUB with moderate stool burden. Dulcolax suppository x 2 ordered.   12:31 pm. - Pt. Received dulcolax suppository with subsequent small stool. Says he is feeling better and is agreeable to discharge. He has miralax already, and would not like any further prescription to help with his constipation. He has no abdominal pain at this time. Discussed  With him the need for close PCP follow up and screening colonoscopy given his age. CMET, CBC, UDS, Urinalysis were all WNL. Pt. Stable and ready for d/c.   Aquilla Hacker, MD 02/10/15 1233  Leonard Schwartz, MD 02/22/15 [phone]

## 2015-02-10 NOTE — ED Notes (Signed)
Pt aware of need for urine sample, provided with urinal and instructed to put his urine in it as soon as possible.

## 2015-02-10 NOTE — Discharge Instructions (Signed)

## 2015-02-10 NOTE — ED Notes (Signed)
Bed: OM76 Expected date:  Expected time:  Means of arrival:  Comments: EMS - ab pain x 1 year

## 2015-04-23 ENCOUNTER — Emergency Department (HOSPITAL_COMMUNITY): Payer: Self-pay

## 2015-04-23 ENCOUNTER — Inpatient Hospital Stay (HOSPITAL_COMMUNITY)
Admission: EM | Admit: 2015-04-23 | Discharge: 2015-04-27 | DRG: 308 | Disposition: A | Discharge status: Home / Self Care | Payer: Self-pay | Attending: Internal Medicine | Admitting: Internal Medicine

## 2015-04-23 ENCOUNTER — Encounter (HOSPITAL_COMMUNITY): Payer: Self-pay | Admitting: *Deleted

## 2015-04-23 ENCOUNTER — Observation Stay (HOSPITAL_COMMUNITY): Payer: Self-pay

## 2015-04-23 DIAGNOSIS — Z7982 Long term (current) use of aspirin: Secondary | ICD-10-CM

## 2015-04-23 DIAGNOSIS — E11649 Type 2 diabetes mellitus with hypoglycemia without coma: Secondary | ICD-10-CM | POA: Diagnosis present

## 2015-04-23 DIAGNOSIS — Z720 Tobacco use: Secondary | ICD-10-CM | POA: Diagnosis present

## 2015-04-23 DIAGNOSIS — K5900 Constipation, unspecified: Secondary | ICD-10-CM | POA: Diagnosis present

## 2015-04-23 DIAGNOSIS — E43 Unspecified severe protein-calorie malnutrition: Secondary | ICD-10-CM | POA: Diagnosis present

## 2015-04-23 DIAGNOSIS — E785 Hyperlipidemia, unspecified: Secondary | ICD-10-CM | POA: Diagnosis present

## 2015-04-23 DIAGNOSIS — M62569 Muscle wasting and atrophy, not elsewhere classified, unspecified lower leg: Secondary | ICD-10-CM

## 2015-04-23 DIAGNOSIS — I441 Atrioventricular block, second degree: Principal | ICD-10-CM

## 2015-04-23 DIAGNOSIS — I10 Essential (primary) hypertension: Secondary | ICD-10-CM | POA: Diagnosis present

## 2015-04-23 DIAGNOSIS — Z590 Homelessness: Secondary | ICD-10-CM

## 2015-04-23 DIAGNOSIS — I77810 Thoracic aortic ectasia: Secondary | ICD-10-CM

## 2015-04-23 DIAGNOSIS — R209 Unspecified disturbances of skin sensation: Secondary | ICD-10-CM

## 2015-04-23 DIAGNOSIS — E119 Type 2 diabetes mellitus without complications: Secondary | ICD-10-CM

## 2015-04-23 DIAGNOSIS — R42 Dizziness and giddiness: Secondary | ICD-10-CM

## 2015-04-23 DIAGNOSIS — F17210 Nicotine dependence, cigarettes, uncomplicated: Secondary | ICD-10-CM | POA: Diagnosis present

## 2015-04-23 DIAGNOSIS — R001 Bradycardia, unspecified: Secondary | ICD-10-CM | POA: Diagnosis not present

## 2015-04-23 DIAGNOSIS — Z23 Encounter for immunization: Secondary | ICD-10-CM

## 2015-04-23 DIAGNOSIS — I712 Thoracic aortic aneurysm, without rupture: Secondary | ICD-10-CM | POA: Diagnosis present

## 2015-04-23 LAB — CBC
HEMATOCRIT: 44.5 % (ref 39.0–52.0)
HEMATOCRIT: 45 % (ref 39.0–52.0)
Hemoglobin: 14.9 g/dL (ref 13.0–17.0)
Hemoglobin: 14.9 g/dL (ref 13.0–17.0)
MCH: 30.6 pg (ref 26.0–34.0)
MCH: 30.1 pg (ref 26.0–34.0)
MCHC: 33.5 g/dL (ref 30.0–36.0)
MCHC: 33.1 g/dL (ref 30.0–36.0)
MCV: 91.4 fL (ref 78.0–100.0)
MCV: 90.9 fL (ref 78.0–100.0)
PLATELETS: 161 10*3/uL (ref 150–400)
Platelets: 155 10*3/uL (ref 150–400)
RBC: 4.87 MIL/uL (ref 4.22–5.81)
RBC: 4.95 MIL/uL (ref 4.22–5.81)
RDW: 12.8 % (ref 11.5–15.5)
RDW: 12.6 % (ref 11.5–15.5)
WBC: 2.1 10*3/uL — ABNORMAL LOW (ref 4.0–10.5)
WBC: 3.3 10*3/uL — AB (ref 4.0–10.5)

## 2015-04-23 LAB — URINALYSIS, ROUTINE W REFLEX MICROSCOPIC
GLUCOSE, UA: NEGATIVE mg/dL
Hgb urine dipstick: NEGATIVE
KETONES UR: NEGATIVE mg/dL
LEUKOCYTES UA: NEGATIVE
NITRITE: NEGATIVE
Protein, ur: NEGATIVE mg/dL
SPECIFIC GRAVITY, URINE: 1.031 — AB (ref 1.005–1.030)
Urobilinogen, UA: 2 mg/dL — ABNORMAL HIGH (ref 0.0–1.0)
pH: 6 (ref 5.0–8.0)

## 2015-04-23 LAB — COMPREHENSIVE METABOLIC PANEL
ALT: 12 U/L — AB (ref 17–63)
AST: 14 U/L — AB (ref 15–41)
Albumin: 4.2 g/dL (ref 3.5–5.0)
Alkaline Phosphatase: 66 U/L (ref 38–126)
Anion gap: 6 (ref 5–15)
BUN: 13 mg/dL (ref 6–20)
CHLORIDE: 105 mmol/L (ref 101–111)
CO2: 29 mmol/L (ref 22–32)
Calcium: 9.1 mg/dL (ref 8.9–10.3)
Creatinine, Ser: 1.01 mg/dL (ref 0.61–1.24)
GFR calc Af Amer: >60 mL/min (ref >60)
GFR calc non Af Amer: >60 mL/min (ref >60)
Glucose, Bld: 87 mg/dL (ref 65–99)
POTASSIUM: 3.7 mmol/L (ref 3.5–5.1)
Sodium: 140 mmol/L (ref 135–145)
Total Bilirubin: 0.8 mg/dL (ref 0.3–1.2)
Total Protein: 7.2 g/dL (ref 6.5–8.1)

## 2015-04-23 LAB — CREATININE, SERUM
Creatinine, Ser: 1.08 mg/dL (ref 0.61–1.24)
GFR calc non Af Amer: >60 mL/min (ref >60)

## 2015-04-23 LAB — I-STAT CHEM 8, ED
BUN: 12 mg/dL (ref 6–20)
CREATININE: 1 mg/dL (ref 0.61–1.24)
Calcium, Ion: 1.2 mmol/L (ref 1.12–1.23)
Chloride: 102 mmol/L (ref 101–111)
Glucose, Bld: 83 mg/dL (ref 65–99)
HCT: 46 % (ref 39.0–52.0)
Hemoglobin: 15.6 g/dL (ref 13.0–17.0)
Potassium: 3.7 mmol/L (ref 3.5–5.1)
Sodium: 142 mmol/L (ref 135–145)
TCO2: 27 mmol/L (ref 0–100)

## 2015-04-23 LAB — I-STAT TROPONIN, ED: Troponin i, poc: 0 ng/mL (ref 0.00–0.08)

## 2015-04-23 LAB — LIPASE, BLOOD: LIPASE: 19 U/L — AB (ref 22–51)

## 2015-04-23 LAB — CBG MONITORING, ED: Glucose-Capillary: 84 mg/dL (ref 65–99)

## 2015-04-23 LAB — TROPONIN I: Troponin I: <0.03 ng/mL (ref <0.031)

## 2015-04-23 LAB — GLUCOSE, CAPILLARY: Glucose-Capillary: 113 mg/dL — ABNORMAL HIGH (ref 65–99)

## 2015-04-23 LAB — TSH: TSH: 2.649 u[IU]/mL (ref 0.350–4.500)

## 2015-04-23 LAB — HIV ANTIBODY (ROUTINE TESTING W REFLEX): HIV Screen 4th Generation wRfx: NONREACTIVE

## 2015-04-23 MED ORDER — ENSURE ENLIVE PO LIQD: 237.0000 mL | Freq: Two times a day (BID) | ORAL | Status: DC

## 2015-04-23 MED ORDER — ASPIRIN 325 MG PO TABS
325.0000 mg | ORAL_TABLET | Freq: Every day | ORAL | Status: DC
Start: 2015-04-23 — End: 2015-04-27
  Administered 2015-04-24 – 2015-04-27 (×5): 325 mg via ORAL
  Filled 2015-04-23 (×5): qty 1

## 2015-04-23 MED ORDER — ASPIRIN 300 MG RE SUPP
300.0000 mg | Freq: Every day | RECTAL | Status: DC
  Filled 2015-04-23 (×5): qty 1

## 2015-04-23 MED ORDER — SODIUM CHLORIDE 0.9 % IV SOLN
INTRAVENOUS | Status: AC
  Administered 2015-04-24 (×2): via INTRAVENOUS

## 2015-04-23 MED ORDER — ENOXAPARIN SODIUM 40 MG/0.4ML ~~LOC~~ SOLN
40.0000 mg | SUBCUTANEOUS | Status: DC
  Administered 2015-04-24 – 2015-04-26 (×3): 40 mg via SUBCUTANEOUS
  Filled 2015-04-23 (×4): qty 0.4

## 2015-04-23 MED ORDER — HYDRALAZINE HCL 20 MG/ML IJ SOLN
10.0000 mg | Freq: Once | INTRAMUSCULAR | Status: AC
  Administered 2015-04-23: 10 mg via INTRAVENOUS
  Filled 2015-04-23: qty 1

## 2015-04-23 MED ORDER — HYDRALAZINE HCL 20 MG/ML IJ SOLN: 10.0000 mg | INTRAMUSCULAR | Status: DC | PRN

## 2015-04-23 MED ORDER — INSULIN ASPART 100 UNIT/ML ~~LOC~~ SOLN: 0.0000 [IU] | Freq: Three times a day (TID) | SUBCUTANEOUS | Status: DC

## 2015-04-23 MED ORDER — SENNOSIDES-DOCUSATE SODIUM 8.6-50 MG PO TABS
1.0000 | ORAL_TABLET | Freq: Every evening | ORAL | Status: DC | PRN
  Administered 2015-04-25: 1 via ORAL
  Filled 2015-04-23: qty 1

## 2015-04-23 MED ORDER — PNEUMOCOCCAL VAC POLYVALENT 25 MCG/0.5ML IJ INJ
0.5000 mL | INJECTION | INTRAMUSCULAR | Status: AC
  Administered 2015-04-24: 0.5 mL via INTRAMUSCULAR
  Filled 2015-04-23 (×2): qty 0.5

## 2015-04-23 MED ORDER — INFLUENZA VAC SPLIT QUAD 0.5 ML IM SUSY
0.5000 mL | PREFILLED_SYRINGE | INTRAMUSCULAR | Status: AC
  Administered 2015-04-24: 0.5 mL via INTRAMUSCULAR
  Filled 2015-04-23 (×2): qty 0.5

## 2015-04-23 NOTE — ED Notes (Signed)
Pt alert and oriented x4. Respirations even and unlabored, bilateral symmetrical rise and fall of chest. Skin warm and dry. In no acute distress. Denies needs.   

## 2015-04-23 NOTE — ED Notes (Addendum)
Per ems, pt went to firestation, initially pt reported he was having stroke like symptoms, (weakness in right arm) no hx of stroke, no neuro deficits, negative stroke scale. Then pt stated he was weak, unable to have a bowel movment in over 1 month, and weight loss (over 100 pounds in last year). Reports he has been to the hospital for these same GI complaints, never gotten an official diagnosis.    Upon rn assessment.Pt reports he went to fire station because right ring finger and pinkie finger went numb, but numbness resolved before arriving to ED. No neuro deficits noted, hand grips and leg pushes bil equal. Pt ambulted to bed. Currently now pt reports bilateral flank pain 10/10. Denies blood in urine. Reports dysuria.  Pt reports last bowel movement 2 weeks ago. States "his head tells him he is hungry, but his stomach tells him he is full". Hx of HTN, has not taken HTN meds in months. Denies nausea/ vomiting.

## 2015-04-23 NOTE — Consult Note (Signed)
Patient ID: Javier Rivera MRN: [id number] DOB/AGE: 53-Aug-1962 53 y.o.  Admit date: 04/23/2015 Primary Physician Mack Hook, MD Primary Cardiologist unassigned   Chief Complaint multiple  HPI: Javier Rivera is a 47M with hypertension, diabetes mellitus type II (A1c unknown ) and hyperlipidemia, tobacco abuse and homelessness who presented to the hospital with a complaint of vision disturbance among multiple other complaints.  Cardiology was called for an incidental finding of Mobitz I on telemetry.  Javier Rivera reports that this AM he had visual disturbance and weakness on his R arm.  He thought he was having a stroke and that he was dying.  He presented to a firestation and was referred to the ED.  Upon arrival to the ED and in the fire station, there have not been any recognized neuro deficits.  Javier Rivera recounts that he has experienced numbness in both feet and tingling in his arms that he attributes to being switched from glipizide to metformin.  He was previously getting all his medications through a medication assistance program that is no longer available.  He has not been taking any medications for 9 months.    Javier Rivera reports chronic constipation, last BM was 2 weeks ago.  He has been resistant to all OTC meds including miralax, magnesium citrate, Episom salt and others.  He also reports not sleeping in a year.  He reports pain and wasting in bilateral feet and a 100lb weight loss.  His review of systems is pan-positive for all but diarrhea.  He endorses chest pain (though he does not elaborate or recount a specific episode), shortness of breath (though this is reportedly due to dry sinus passages), dizziness (though he recalls this occurring when he closes his eyes and tries to sleep), edema (though there is no visible edema, in fact he appears intravascularly volume depleted).  He is a difficult historian and frequently tangential.  In the ED Javier Rivera was noted to have  sinus bradycardia in the 50s with occasional episodes of Mobitz II.  While talking to him there were 2 episodes noted on the monitor and he was unaware of these episodes and did not report symptoms.   Past Medical History  Diagnosis Date  . Diabetes mellitus without complication   . Hypertension   . Renal disorder     pt stated that masses were found on his kidneys     (Not in a hospital admission)   Infusions:   Allergies  Allergen Reactions  . Lisinopril Palpitations    "made me real sick, made my heart rate scatter"    Social History   Social History  . Marital Status: Single    Spouse Name: N/A  . Number of Children: N/A  . Years of Education: N/A   Occupational History  . Not on file.   Social History Main Topics  . Smoking status: Current Every Day Smoker    Types: Cigarettes  . Smokeless tobacco: Not on file  . Alcohol Use: No  . Drug Use: Not on file  . Sexual Activity: Not on file   Other Topics Concern  . Not on file   Social History Narrative    History reviewed. No pertinent family history.  PHYSICAL EXAM: Filed Vitals:   04/23/15 1525  BP: 164/105  Pulse: 50  Temp:   Resp: 16    No intake or output data in the 24 hours ending 04/23/15 1543  General:  Chronically ill-appearing. NAD. HEENT: dry mucous membranes Neck: supple.  no JVD. Carotids 2+ bilat; no bruits. No lymphadenopathy or thryomegaly appreciated. Cor: PMI nondisplaced. Bradycardic, regular rhythm.  No rubs, gallops or murmurs. Lungs: clear bilaterally Abdomen: soft, nontender, nondistended. No hepatosplenomegaly. No bruits or masses. Good bowel sounds. Extremities: no cyanosis, clubbing, edema.  Hypopigmentation of the feet to the ankles and hyperpigmentation of the calves to the thighs.  Severe desquamation of the feet. Neuro: alert & oriented x 3, cranial nerves grossly intact. moves all 4 extremities w/o difficulty. Affect pleasant.  Tangential and difficult to follow.   Wasting of the dorsal surface of the feet.  Results for orders placed or performed during the hospital encounter of 04/23/15 (from the past 24 hour(s))  CBG monitoring, ED     Status: None   Collection Time: 04/23/15  7:44 AM  Result Value Ref Range   Glucose-Capillary 84 65 - 99 mg/dL  Lipase, blood     Status: Abnormal   Collection Time: 04/23/15  7:53 AM  Result Value Ref Range   Lipase 19 (L) 22 - 51 U/L  Comprehensive metabolic panel     Status: Abnormal   Collection Time: 04/23/15  7:53 AM  Result Value Ref Range   Sodium 140 135 - 145 mmol/L   Potassium 3.7 3.5 - 5.1 mmol/L   Chloride 105 101 - 111 mmol/L   CO2 29 22 - 32 mmol/L   Glucose, Bld 87 65 - 99 mg/dL   BUN 13 6 - 20 mg/dL   Creatinine, Ser 1.01 0.61 - 1.24 mg/dL   Calcium 9.1 8.9 - 10.3 mg/dL   Total Protein 7.2 6.5 - 8.1 g/dL   Albumin 4.2 3.5 - 5.0 g/dL   AST 14 (L) 15 - 41 U/L   ALT 12 (L) 17 - 63 U/L   Alkaline Phosphatase 66 38 - 126 U/L   Total Bilirubin 0.8 0.3 - 1.2 mg/dL   GFR calc non Af Amer >60 >60 mL/min   GFR calc Af Amer >60 >60 mL/min   Anion gap 6 5 - 15  CBC     Status: Abnormal   Collection Time: 04/23/15  7:53 AM  Result Value Ref Range   WBC 2.1 (L) 4.0 - 10.5 K/uL   RBC 4.87 4.22 - 5.81 MIL/uL   Hemoglobin 14.9 13.0 - 17.0 g/dL   HCT 44.5 39.0 - 52.0 %   MCV 91.4 78.0 - 100.0 fL   MCH 30.6 26.0 - 34.0 pg   MCHC 33.5 30.0 - 36.0 g/dL   RDW 12.8 11.5 - 15.5 %   Platelets 161 150 - 400 K/uL  I-Stat Troponin, ED (not at [facility])     Status: None   Collection Time: 04/23/15  8:04 AM  Result Value Ref Range   Troponin i, poc 0.00 0.00 - 0.08 ng/mL   Comment 3          I-Stat Chem 8, ED     Status: None   Collection Time: 04/23/15  8:05 AM  Result Value Ref Range   Sodium 142 135 - 145 mmol/L   Potassium 3.7 3.5 - 5.1 mmol/L   Chloride 102 101 - 111 mmol/L   BUN 12 6 - 20 mg/dL   Creatinine, Ser 1.00 0.61 - 1.24 mg/dL   Glucose, Bld 83 65 - 99 mg/dL   Calcium, Ion 1.20 1.12 - 1.23  mmol/L   TCO2 27 0 - 100 mmol/L   Hemoglobin 15.6 13.0 - 17.0 g/dL   HCT 46.0 39.0 - 52.0 %  HIV antibody     Status: None   Collection Time: 04/23/15  8:33 AM  Result Value Ref Range   HIV Screen 4th Generation wRfx Non Reactive Non Reactive  Urinalysis, Routine w reflex microscopic (not at [facility])     Status: Abnormal   Collection Time: 04/23/15  8:43 AM  Result Value Ref Range   Color, Urine AMBER (A) YELLOW   APPearance CLEAR CLEAR   Specific Gravity, Urine 1.031 (H) 1.005 - 1.030   pH 6.0 5.0 - 8.0   Glucose, UA NEGATIVE NEGATIVE mg/dL   Hgb urine dipstick NEGATIVE NEGATIVE   Bilirubin Urine SMALL (A) NEGATIVE   Ketones, ur NEGATIVE NEGATIVE mg/dL   Protein, ur NEGATIVE NEGATIVE mg/dL   Urobilinogen, UA 2.0 (H) 0.0 - 1.0 mg/dL   Nitrite NEGATIVE NEGATIVE   Leukocytes, UA NEGATIVE NEGATIVE   Dg Chest 2 View  04/23/2015   CLINICAL DATA:  Chest discomfort with some difficulty breathing for some time now, came in today for numbness, tingling in fingers, poor historian  EXAM: CHEST  2 VIEW  COMPARISON:  01/01/2015  FINDINGS: Stable mild hyperinflation. Heart size and vascular pattern are normal. No infiltrate or consolidation. No pleural effusions. 8 mm nodular opacity over the left lower lobe.  IMPRESSION: No acute findings. Possible pulmonary nodule versus nipple shadow on the left. Recommend repeating the PA view with nipple markers.   Electronically Signed   By: Skipper Cliche M.D.   On: 04/23/2015 09:16    ECG: Sinus bradycardia at 39 bpm.  Mobitz I (2 dropped beats on ECG).  Old anteroseptal infarct.   ASSESSMENT/PLAN: Javier Rivera has multiple complaints, and it is unclear to me that any of them are due to his Mobitz I.  While talking with him I noted 2 episodes of dropped beats, and he continued to talk without missing a beat or reporting any symptoms.  It seems that the more pressing issue is management of his poorly-controlled hypertension.  He has been unable to afford his  medications and today has systolic BPs near 017 mmHg.  # Mobitz I: Seemingly asymptomatic. - Avoid nodal agents - Continue telemetry for now.  If leaving soon would get 24H Holter to assess for lowest HR and symptom association with dropped beats - K>4, Mg >2  # Hypertensive urgency: This may be the reason for his facial fullness/HA. - Hydralazine 10mg  IV now - Restart home amlodipine, HTCZ and lisinopril  # Hyperlipidema: Resume statin.  # Neurologic complaints: May be worthwhile to have Neuro comment on his visual disturbance this AM and whether any further TIA/stroke work up is warranted.  I'm actually more concerned with his feet, which appear wasted and deformed.  Seems unlikely to be due to diabetic neuropathy, especially given his glucose of 87, though no recent A1c is in the computer.  Given his complaint of numbness and tingling, this could be related to a neuropathic process.  # Dispo: Javier Rivera needs close PCP follow up to re-establish his medications.  Signed: Sharol Harness, MD 04/23/2015, 3:43 PM

## 2015-04-23 NOTE — ED Provider Notes (Signed)
CSN: [id number]     Arrival date & time 04/23/15  0732 History   First MD Initiated Contact with Patient 04/23/15 [phone]     Chief Complaint  Patient presents with  . Flank Pain  . Dysuria     (Consider location/radiation/quality/duration/timing/severity/associated sxs/prior Treatment) Patient is a 54 y.o. male presenting with dizziness.  Dizziness Quality:  Lightheadedness Severity:  Severe Onset quality:  Gradual Duration: today. Timing:  Constant Progression:  Improving Chronicity:  New Context comment:  Spontaneously Relieved by: rest. Associated symptoms: nausea and shortness of breath   Associated symptoms: no chest pain, no syncope and no vomiting   Associated symptoms comment:  Constipation for past year.   Past Medical History  Diagnosis Date  . Diabetes mellitus without complication   . Hypertension   . Renal disorder     pt stated that masses were found on his kidneys   Past Surgical History  Procedure Laterality Date  . Knee surgery      bilateral   History reviewed. No pertinent family history. Social History  Substance Use Topics  . Smoking status: Current Every Day Smoker    Types: Cigarettes  . Smokeless tobacco: None  . Alcohol Use: No    Review of Systems  Respiratory: Positive for shortness of breath.   Cardiovascular: Negative for chest pain and syncope.  Gastrointestinal: Positive for nausea. Negative for vomiting.  Neurological: Positive for dizziness.  All other systems reviewed and are negative.     Allergies  Lisinopril  Home Medications   Prior to Admission medications   Medication Sig Start Date End Date Taking? Authorizing Provider  amLODipine (NORVASC) 10 MG tablet Take 10 mg by mouth daily.    Historical Provider, MD  aspirin EC 81 MG tablet Take 81 mg by mouth daily.    Historical Provider, MD  benazepril (LOTENSIN) 40 MG tablet Take 40 mg by mouth daily.    Historical Provider, MD  erythromycin ophthalmic ointment Place  a 1/2 inch ribbon of ointment into the lower eyelid. Patient not taking: Reported on 02/10/2015 06/28/12   Delos Haring, PA-C  glipiZIDE (GLUCOTROL XL) 5 MG 24 hr tablet Take 5 mg by mouth daily.    Historical Provider, MD  guaiFENesin (ROBITUSSIN) 100 MG/5ML SOLN Take 5 mLs (100 mg total) by mouth every 4 (four) hours as needed for cough or to loosen phlegm. 09/06/14   Tiffany Carlota Raspberry, PA-C  hydrochlorothiazide (HYDRODIURIL) 25 MG tablet Take 25 mg by mouth daily.    Historical Provider, MD  ketorolac (ACULAR) 0.5 % ophthalmic solution Place 1 drop into the left eye every 6 (six) hours. Patient not taking: Reported on 02/10/2015 06/28/12   Delos Haring, PA-C  metroNIDAZOLE (FLAGYL) 500 MG tablet Take 1 tablet (500 mg total) by mouth 2 (two) times daily. Patient not taking: Reported on 02/10/2015 01/01/15   Ottie Glazier, PA-C  simvastatin (ZOCOR) 10 MG tablet Take 10 mg by mouth daily.    Historical Provider, MD  sodium phosphate (FLEET) 7-19 GM/118ML ENEM Place 133 mLs (1 enema total) rectally daily as needed for severe constipation. Patient not taking: Reported on 02/10/2015 09/05/14   Delos Haring, PA-C   BP 184/114 mmHg  Pulse 48  Temp(Src) 97.5 F (36.4 C) (Oral)  Resp 18  SpO2 100% Physical Exam  Constitutional: He is oriented to person, place, and time. He appears well-developed and well-nourished. No distress.  Thin  HENT:  Head: Normocephalic and atraumatic.  Mouth/Throat: Oropharynx is clear and moist.  Eyes:  Conjunctivae are normal. Pupils are equal, round, and reactive to light. No scleral icterus.  Neck: Neck supple.  Cardiovascular: Regular rhythm and intact distal pulses.  Bradycardia present.  Exam reveals distant heart sounds.   No murmur heard. Pulmonary/Chest: Effort normal and breath sounds normal. No stridor. No respiratory distress. He has no wheezes. He has no rales.  Abdominal: Soft. He exhibits no distension. There is no tenderness.  Musculoskeletal: Normal  range of motion. He exhibits no edema.  Neurological: He is alert and oriented to person, place, and time.  Skin: Skin is warm and dry. No rash noted.  Psychiatric: He has a normal mood and affect. His behavior is normal.  Nursing note and vitals reviewed.   ED Course  Procedures (including critical care time) Labs Review Labs Reviewed  LIPASE, BLOOD - Abnormal; Notable for the following:    Lipase 19 (*)    All other components within normal limits  COMPREHENSIVE METABOLIC PANEL - Abnormal; Notable for the following:    AST 14 (*)    ALT 12 (*)    All other components within normal limits  CBC - Abnormal; Notable for the following:    WBC 2.1 (*)    All other components within normal limits  URINALYSIS, ROUTINE W REFLEX MICROSCOPIC (NOT AT [facility]) - Abnormal; Notable for the following:    Color, Urine AMBER (*)    Specific Gravity, Urine 1.031 (*)    Bilirubin Urine SMALL (*)    Urobilinogen, UA 2.0 (*)    All other components within normal limits  HIV ANTIBODY (ROUTINE TESTING)  CBG MONITORING, ED  I-STAT CHEM 8, ED  I-STAT TROPOININ, ED    Imaging Review Dg Chest 2 View  04/23/2015   CLINICAL DATA:  Chest discomfort with some difficulty breathing for some time now, came in today for numbness, tingling in fingers, poor historian  EXAM: CHEST  2 VIEW  COMPARISON:  01/01/2015  FINDINGS: Stable mild hyperinflation. Heart size and vascular pattern are normal. No infiltrate or consolidation. No pleural effusions. 8 mm nodular opacity over the left lower lobe.  IMPRESSION: No acute findings. Possible pulmonary nodule versus nipple shadow on the left. Recommend repeating the PA view with nipple markers.   Electronically Signed   By: Skipper Cliche M.D.   On: 04/23/2015 09:16   I have personally reviewed and evaluated these images and lab results as part of my medical decision-making.   EKG Interpretation   Date/Time:  Thursday April 23 2015 13:15:04 EDT Ventricular Rate:   39 PR Interval:  232 QRS Duration: 101 QT Interval:  450 QTC Calculation: 362 R Axis:   63 Text Interpretation:  Sinus bradycardia Second deg AVB, Mobitz I  (Wenckebach) Probable anteroseptal infarct, old Minimal ST elevation,  inferior leads Baseline wander in lead(s) V2 Confirmed by [facility]  MD,  TREY ([id number]) on 04/23/2015 5:56:09 PM      MDM   Final diagnoses:  Dizziness  AV block, 2nd degree   Pt is a very poor historian with multiple vague and apparently chronic complaints.  However, today, he says he had to go to the fire department due to sever lightheadedness/pre syncope and left 4th and 5th digit tingling.  He denied focal weakness.   His tingling fits a radicular pattern and his neurologic exam is normal.  However, his EKG shows evidence of 2nd degree AV block.  Unclear whether this is the cause of his presyncope.  Discussed with Cardiology (Dr. Tommie Raymond) who will obs  overnight and potentially place on longer term monitoring.    Serita Grit, MD 04/23/15 [phone]

## 2015-04-23 NOTE — H&P (Signed)
[facility] History and Physical  Javier Rivera WIO:[id number] DOB: May 31, 1961 DOA: 04/23/2015  Referring physician: Patient was referred to [place] by cardiologist. I was notified about the patient at 8 PM on 04/22/2015. PCP: Mack Hook, MD  Specialists: None.  Chief Complaint: Dizziness.  HPI: Javier Rivera is a 54 y.o. male with history of diabetes mellitus, hypertension, ongoing tobacco abuse presents to the ER after patient was having dizziness with some visual symptoms. In addition patient also was having some numbness of the left hand lateral 2 fingers and numbness in his legs. In the ER patient was found to have second-degree AV block and cardiologist was consulted. Patient's blood pressure was also found to be elevated. Patient states over the last one he has not been taking his medications due to financial issues. And he also has lost at least 100 pounds in last 1 year as per the patient. Patient denies any fever or chills night sweats productive cough any blood in the stools or nausea vomiting. Patient's blood pressure on arrival was found to be elevated. Patient in addition to the above symptoms has been having some pressure-like symptoms in head. In addition patient has multiple complaints including some chest pressure off and on with shortness of breath. Presently patient is not in distress. On exam patient is able to move all extremities. Appears nonfocal.   Review of Systems: As presented in the history of presenting illness, rest negative.  Past Medical History  Diagnosis Date  . Diabetes mellitus without complication   . Hypertension   . Renal disorder     pt stated that masses were found on his kidneys   Past Surgical History  Procedure Laterality Date  . Knee surgery      bilateral   Social History:  reports that he has been smoking Cigarettes.  He has a 5 pack-year smoking history. He has never used smokeless tobacco. He reports that he does not drink alcohol  or use illicit drugs. Where does patient live homeless. Can patient participate in ADLs? Yes.  Allergies  Allergen Reactions  . Lisinopril Palpitations    "made me real sick, made my heart rate scatter"    Family History:  Family History  Problem Relation Age of Onset  . Diabetes Mellitus II Mother   . Hypertension Mother   . Diabetes Mellitus II Sister   . Hypertension Sister       Prior to Admission medications   Medication Sig Start Date End Date Taking? Authorizing Provider  amLODipine (NORVASC) 10 MG tablet Take 10 mg by mouth daily.    Historical Provider, MD  aspirin EC 81 MG tablet Take 81 mg by mouth daily.    Historical Provider, MD  benazepril (LOTENSIN) 40 MG tablet Take 40 mg by mouth daily.    Historical Provider, MD  erythromycin ophthalmic ointment Place a 1/2 inch ribbon of ointment into the lower eyelid. Patient not taking: Reported on 02/10/2015 06/28/12   Delos Haring, PA-C  glipiZIDE (GLUCOTROL XL) 5 MG 24 hr tablet Take 5 mg by mouth daily.    Historical Provider, MD  hydrochlorothiazide (HYDRODIURIL) 25 MG tablet Take 25 mg by mouth daily.    Historical Provider, MD  metroNIDAZOLE (FLAGYL) 500 MG tablet Take 1 tablet (500 mg total) by mouth 2 (two) times daily. Patient not taking: Reported on 02/10/2015 01/01/15   Ottie Glazier, PA-C  simvastatin (ZOCOR) 10 MG tablet Take 10 mg by mouth daily.    Historical Provider, MD    Physical  Exam: Filed Vitals:   04/23/15 1440 04/23/15 1525 04/23/15 1715 04/23/15 1836  BP: 174/99 164/105 172/122 163/95  Pulse: 51 50 66 66  Temp: 97.8 F (36.6 C)   98.7 F (37.1 C)  TempSrc:    Oral  Resp: 16 16 12 15   Height:    6\' 9"  (2.057 m)  Weight:    80.922 kg (178 lb 6.4 oz)  SpO2: 100% 100% 100% 100%     General:  Moderately built and poorly nourished. The  Eyes: Anicteric no pallor.  ENT: No discharge from the ears eyes nose and mouth.  Neck: No mass felt. No JVD appreciated.  Cardiovascular: S1 and S2  heard.  Respiratory: No rhonchi or crepitations.  Abdomen: Soft nontender bowel sounds present.  Skin: No rash.  Musculoskeletal: No edema.  Psychiatric: Appears normal.  Neurologic: Alert awake oriented to time place and person. Moves all extremities 5 x 5. No facial asymmetry. Tongue is midline.  Labs on Admission:  Basic Metabolic Panel:  Recent Labs Lab 04/23/15 0753 04/23/15 0805  NA 140 142  K 3.7 3.7  CL 105 102  CO2 29  --   GLUCOSE 87 83  BUN 13 12  CREATININE 1.01 1.00  CALCIUM 9.1  --    Liver Function Tests:  Recent Labs Lab 04/23/15 0753  AST 14*  ALT 12*  ALKPHOS 66  BILITOT 0.8  PROT 7.2  ALBUMIN 4.2    Recent Labs Lab 04/23/15 0753  LIPASE 19*   No results for input(s): AMMONIA in the last 168 hours. CBC:  Recent Labs Lab 04/23/15 0753 04/23/15 0805  WBC 2.1*  --   HGB 14.9 15.6  HCT 44.5 46.0  MCV 91.4  --   PLT 161  --    Cardiac Enzymes: No results for input(s): CKTOTAL, CKMB, CKMBINDEX, TROPONINI in the last 168 hours.  BNP (last 3 results) No results for input(s): BNP in the last 8760 hours.  ProBNP (last 3 results) No results for input(s): PROBNP in the last 8760 hours.  CBG:  Recent Labs Lab 04/23/15 0744  GLUCAP 84    Radiological Exams on Admission: Dg Chest 2 View  04/23/2015   CLINICAL DATA:  Chest discomfort with some difficulty breathing for some time now, came in today for numbness, tingling in fingers, poor historian  EXAM: CHEST  2 VIEW  COMPARISON:  01/01/2015  FINDINGS: Stable mild hyperinflation. Heart size and vascular pattern are normal. No infiltrate or consolidation. No pleural effusions. 8 mm nodular opacity over the left lower lobe.  IMPRESSION: No acute findings. Possible pulmonary nodule versus nipple shadow on the left. Recommend repeating the PA view with nipple markers.   Electronically Signed   By: Skipper Cliche M.D.   On: 04/23/2015 09:16    EKG: Independently reviewed. Second-degree  AV block.  Assessment/Plan Principal Problem:   Dizziness Active Problems:   Heart block AV second degree   Diabetes mellitus type 2, controlled   Essential hypertension   Tobacco abuse   AV block, 2nd degree   1. Dizziness - appreciate cardiology consult. Patient does have second-degree AV block. Cardiology at this time feel that patient may also need a neurology workup for which I have ordered stat CT and MRI brain. Since patient has been complaining of numbness in the extremities have ordered folate levels be 2 levels RPR and TSH. HIV has been negative. Physical therapy consult. 2. Hypertensive urgency - for now I have placed patient on when necessary  IV hydralazine and amlodipine 5 mg by mouth daily. Closely follow blood pressure trends. 3. Second-degree AV block - cardiology consult appreciated. 4. History of diabetes mellitus type 2 - patient has not been taking his medications for at least 9 months now. Since patient states he also has lost weight. At this time I have placed patient on sliding scale and will check hemoglobin A1c. 5. Hyperlipidemia - check lipid panel. 6. Tobacco abuse - tobacco cessation counseling requested. 7. History of diastolic dysfunction - I have ordered repeat 2-D echo. 8. Weight loss - patient will need further workup on this outpatient.  A repeat chest x-ray has been ordered since patient's first chest x-ray was showing possible lung nodule.   DVT Prophylaxis Lovenox.  Code Status: Full code.  Family Communication: Discussed with patient.  Disposition Plan: Admit for observation.    Esbeydi Manago N. [facility] Pager [phone].  If 7PM-7AM, please contact night-coverage [email or web address] Password [email or web address] 04/23/2015, 9:10 PM

## 2015-04-23 NOTE — Progress Notes (Addendum)
Pt arrived to floor from ED on stretcher, stood, amb to bed w/ steady, indep gait. Oriented to callbell and environment. POC discussed. Placed on tele box 29 and confirmed w/ CCMD. VSS. Offers no specific complaints at this time other than he is hungry and would like to get cleaned up.  Sandwich, crackers, peanut butter provided. Dr Radford Pax paged for admission orders.

## 2015-04-23 NOTE — Clinical Social Work Note (Signed)
Clinical Social Work Assessment  Patient Details  Name: Javier Rivera MRN: [id number] Date of Birth: 1961/06/10  Date of referral:  04/23/15               Reason for consult:  Housing Concerns/Homelessness                Permission sought to share information with:   (None.) Permission granted to share information::  No  Name::        Agency::     Relationship::     Contact Information:     Housing/Transportation Living arrangements for the past 2 months:  Homeless (Patient confirms that he is homeless. He states that his siblings sometimes pay for him to stay in hotels and that other times he lives on the streets. Patient states that he does not like going to shelters.) Source of Information:  Patient Patient Interpreter Needed:  None Criminal Activity/Legal Involvement Pertinent to Current Situation/Hospitalization:  No - Comment as needed Significant Relationships:  Siblings (Patient states that his brother and sister are as supportive as they can be. ) Lives with:  Self Do you feel safe going back to the place where you live?   (Patient is homeless.) Need for family participation in patient care:  Yes (Comment) (Patient states that his brother and sister who live in [place] are supportive as they can be and  are sometimes able to buy him hotel rooms.)  Care giving concerns:  There are no care giving concerns at this time. Patient informed CSW that he is able to complete his own ADL's and that he does not fall often.   Social Worker assessment / plan:  CSW was consulted by Nurse CM to speak with patient regarding homelessness.   CSW met with patient at bedside. There was no family present. Per note, patient presents to [facility] due to flank pain and dysuria. Patient states that he completes his ADL's independently. Patient says that he has a support system that consist of his brother and sister who live in [place]. He states that he believes they are helpful as much as they can be.  Patient confirms that he is homeless. He says that his siblings ae sometimes able to purchase him hotel rooms and when he is not at a hotel patient states that he is on the streets.   CSW gave patient shelter and food pantry information. However, patient states that he does not like to stay in shelters.  Employment status:  Unemployed Forensic scientist:  Other (Comment Required) (None.) PT Recommendations:  Not assessed at this time Information / Referral to community resources:   (CSW gave patient information regarding shelters and food pantries.)  Patient/Family's Response to care:  Patient is appropriate at this time. He is aware that he will be admitted.   Patient/Family's Understanding of and Emotional Response to Diagnosis, Current Treatment, and Prognosis:  Patient is understanding at this time. Patient states that he does not have any questions for CSW.  Emotional Assessment Appearance:  Appears stated age Attitude/Demeanor/Rapport:   (Appropriate.) Affect (typically observed):  Accepting, Appropriate Orientation:  Oriented to Self, Oriented to Place, Oriented to  Time, Oriented to Situation Alcohol / Substance use:  Not Applicable Psych involvement (Current and /or in the community):  No (Comment)  Discharge Needs  Concerns to be addressed:  Adjustment to Illness Readmission within the last 30 days:    Current discharge risk:  None Barriers to Discharge:  No Barriers Identified   Doreena Maulden,  Cas Tracz R, LCSW 04/23/2015, 6:48 PM

## 2015-04-23 NOTE — ED Notes (Signed)
md at bedside

## 2015-04-23 NOTE — Progress Notes (Signed)
[facility] consulted EDSW to see patient regarding homeless issues.

## 2015-04-23 NOTE — ED Notes (Signed)
Pt states he fell "several years ago" and hit his "tailbone".  Pt states he wanted to make sure he mentioned it in case it was related to his current problem today.

## 2015-04-23 NOTE — ED Notes (Addendum)
Pt unable to provide urine sample at this time.   Pt gave consent for HIV testing.

## 2015-04-23 NOTE — Progress Notes (Signed)
EDCM consulted to speak to patient regarding applying for Medicaid.  EDCM spoke to patient at bedside.  Patient confirms he is homeless.  Patient reports he is familiar with the [facility] and services there.  Patient reports, "I just have a phobia of being around a lot of people who are dealing with the same thing I am."  [facility] provided patient with contact information to [facility] and DSS for Medicaid.    EDCM also provided patient with contact information to [facility], informed patient of services there and walk in times.  EDCM also provided patient with list of pcps who accept self pay patients, list of discount pharmacies and websites needymeds.org and GoodRX.com for medication assistance, phone number to inquire about the orange card, phone number to inquire about Medic[employer]ad, phone number to inquire about the [place], financial resources in the community such as local churches, [employer], urban ministries, and dental assistance for uninsured patients.  Patient thankful for resources.  No further EDCM needs at this time.  Social worker currently at bedside.

## 2015-04-23 NOTE — ED Notes (Signed)
Pt can go at 17:50

## 2015-04-23 NOTE — ED Notes (Addendum)
Pt. Is unable to use the restroom at this time, but is aware that we need a urine specimen. He states that he has a kidney problem and it takes a while before he can give a specimen. Urinal at bedside.

## 2015-04-23 NOTE — ED Notes (Signed)
Pt delay due to room not being clean.

## 2015-04-24 ENCOUNTER — Observation Stay (HOSPITAL_COMMUNITY): Payer: Self-pay

## 2015-04-24 ENCOUNTER — Observation Stay (HOSPITAL_COMMUNITY): Payer: MEDICAID

## 2015-04-24 ENCOUNTER — Inpatient Hospital Stay (HOSPITAL_COMMUNITY): Payer: Self-pay

## 2015-04-24 DIAGNOSIS — E43 Unspecified severe protein-calorie malnutrition: Secondary | ICD-10-CM | POA: Insufficient documentation

## 2015-04-24 DIAGNOSIS — R55 Syncope and collapse: Secondary | ICD-10-CM

## 2015-04-24 LAB — COMPREHENSIVE METABOLIC PANEL
ALT: 10 U/L — AB (ref 17–63)
AST: 14 U/L — AB (ref 15–41)
Albumin: 3.8 g/dL (ref 3.5–5.0)
Alkaline Phosphatase: 71 U/L (ref 38–126)
Anion gap: 6 (ref 5–15)
BUN: 20 mg/dL (ref 6–20)
CHLORIDE: 108 mmol/L (ref 101–111)
CO2: 27 mmol/L (ref 22–32)
CREATININE: 0.96 mg/dL (ref 0.61–1.24)
Calcium: 8.8 mg/dL — ABNORMAL LOW (ref 8.9–10.3)
Glucose, Bld: 90 mg/dL (ref 65–99)
POTASSIUM: 3.6 mmol/L (ref 3.5–5.1)
SODIUM: 141 mmol/L (ref 135–145)
Total Bilirubin: 0.6 mg/dL (ref 0.3–1.2)
Total Protein: 6.5 g/dL (ref 6.5–8.1)

## 2015-04-24 LAB — CBC
HEMATOCRIT: 41.5 % (ref 39.0–52.0)
Hemoglobin: 14.1 g/dL (ref 13.0–17.0)
MCH: 30.5 pg (ref 26.0–34.0)
MCHC: 34 g/dL (ref 30.0–36.0)
MCV: 89.8 fL (ref 78.0–100.0)
Platelets: 155 10*3/uL (ref 150–400)
RBC: 4.62 MIL/uL (ref 4.22–5.81)
RDW: 12.7 % (ref 11.5–15.5)
WBC: 4.1 10*3/uL (ref 4.0–10.5)

## 2015-04-24 LAB — FOLATE: Folate: 13.1 ng/mL (ref >5.9)

## 2015-04-24 LAB — GLUCOSE, CAPILLARY
GLUCOSE-CAPILLARY: 120 mg/dL — AB (ref 65–99)
GLUCOSE-CAPILLARY: 62 mg/dL — AB (ref 65–99)
Glucose-Capillary: 89 mg/dL (ref 65–99)
Glucose-Capillary: 85 mg/dL (ref 65–99)
Glucose-Capillary: 91 mg/dL (ref 65–99)

## 2015-04-24 LAB — LIPID PANEL
Cholesterol: 180 mg/dL (ref 0–200)
HDL: 41 mg/dL (ref >40)
LDL CALC: 124 mg/dL — AB (ref 0–99)
TRIGLYCERIDES: 74 mg/dL (ref <150)
Total CHOL/HDL Ratio: 4.4 RATIO
VLDL: 15 mg/dL (ref 0–40)

## 2015-04-24 LAB — TROPONIN I

## 2015-04-24 LAB — RAPID URINE DRUG SCREEN, HOSP PERFORMED
AMPHETAMINES: NOT DETECTED
BARBITURATES: NOT DETECTED
BENZODIAZEPINES: NOT DETECTED
Cocaine: NOT DETECTED
Opiates: NOT DETECTED
Tetrahydrocannabinol: NOT DETECTED

## 2015-04-24 LAB — VITAMIN B12: Vitamin B-12: 169 pg/mL — ABNORMAL LOW (ref 180–914)

## 2015-04-24 MED ORDER — ENSURE ENLIVE PO LIQD
237.0000 mL | Freq: Two times a day (BID) | ORAL | Status: DC
  Administered 2015-04-25 – 2015-04-26 (×4): 237 mL via ORAL

## 2015-04-24 MED ORDER — HYDRALAZINE HCL 20 MG/ML IJ SOLN: 10.0000 mg | INTRAMUSCULAR | Status: DC | PRN

## 2015-04-24 MED ORDER — CYANOCOBALAMIN 1000 MCG/ML IJ SOLN
1000.0000 ug | Freq: Once | INTRAMUSCULAR | Status: AC
  Administered 2015-04-24: 1000 ug via INTRAMUSCULAR
  Filled 2015-04-24: qty 1

## 2015-04-24 MED ORDER — AMLODIPINE BESYLATE 5 MG PO TABS
5.0000 mg | ORAL_TABLET | Freq: Every day | ORAL | Status: DC
  Administered 2015-04-24: 5 mg via ORAL
  Filled 2015-04-24: qty 1

## 2015-04-24 MED ORDER — IOHEXOL 350 MG/ML SOLN
100.0000 mL | Freq: Once | INTRAVENOUS | Status: AC | PRN
  Administered 2015-04-24: 100 mL via INTRAVENOUS

## 2015-04-24 NOTE — Progress Notes (Signed)
Stroke Swallow Screen note:  Pt prior to admit from ED was given food and drink to eat.  Patient states had no difficulty with the food or drink.  Denies getting choked with the food or drink.  However, does state that he feels like he "can't burp".  Swallow screen done with no difficulty.   04/24/15 0045  Step #1 Pre-Swallow Screen  History of dysphagia No  Dysphagia diet prior to admission No  Awake and alert, responding to speech? Yes  Positioned upright, with some head control? Yes  Cough on command? Yes  Maintain control of their saliva? Yes  Lick top and bottom lip? Yes  Breathe freely and maintain O2 sats? Yes  Voice quality clear (No "wet" gurgly or hoarse sounds) Yes  R Upper  Breath Sounds Clear  L Upper Breath Sounds Clear  R Lower Breath Sounds Clear  L Lower Breath Sounds Clear  Step #2 Swallow Screen  Patient was kept strictly NPO prior to this screen No  Water via cup No problems  Water via straw No problems  Cracker No problems  Lung sounds changed after swallow screen No  Passed swallow screen Yes, see row information   I will advise bedside RN of above findings and that he can have a regular consistency diet.  If no previous diet order or a care order directing that the RN can order diet, MD will need to be notified for appropriate diet.  Francia Greaves Johnson,RN,BSN,CCRN (Rapid Response Nurse)

## 2015-04-24 NOTE — Progress Notes (Signed)
CRITICAL VALUE ALERT  Critical value received:  Glucose   Date of notification:  9/16  Time of notification:  0733  Critical value read back:Yes.    Nurse who received alert:  Wess Botts  MD notified (1st page):  Abrol  Time of first page:  0804  MD notified (2nd page):  Time of second page:  Responding MD:  Allyson Sabal  Time MD responded:

## 2015-04-24 NOTE — Progress Notes (Signed)
OT Cancellation Note  Patient Details Name: Javier Rivera MRN: [id number] DOB: 1961/01/22   Cancelled Treatment:    Reason Eval/Treat Not Completed: Medical issues which prohibited therapy.  Plan is for transfer to [facility] today.  OT will check back.  SPENCER,MARYELLEN 04/24/2015, 2:26 PM  Lesle Chris, OTR/L [phone] 04/24/2015

## 2015-04-24 NOTE — Progress Notes (Signed)
Pt's HR dropping to 30s, pt asymptomatic. EKG showed NSR w/2nd degree AVB type I. HR currently between 30s and 40s. VSS and hospitalist aware. Will continue to monitor pt.

## 2015-04-24 NOTE — Progress Notes (Signed)
Initial Nutrition Assessment  DOCUMENTATION CODES:   Severe malnutrition in context of social or environmental circumstances  INTERVENTION:  - Will order Ensure Enlive BID, each supplement provides 350 kcal and 20 grams of protein - RD will continue to monitor for needs  NUTRITION DIAGNOSIS:   Malnutrition related to social / environmental circumstances as evidenced by severe depletion of muscle mass, severe depletion of body fat.  GOAL:   Patient will meet greater than or equal to 90% of their needs  MONITOR:   PO intake, Supplement acceptance, Weight trends, Labs, I & O's  REASON FOR ASSESSMENT:   Malnutrition Screening Tool  ASSESSMENT:   54 y.o. male with history of diabetes mellitus, hypertension, ongoing tobacco abuse presents to the ER after patient was having dizziness with some visual symptoms. In addition patient also was having some numbness of the left hand lateral 2 fingers and numbness in his legs. In the ER patient was found to have second-degree AV block and cardiologist was consulted. Patient's blood pressure was also found to be elevated. Patient states over the last one he has not been taking his medications due to financial issues. And he also has lost at least 100 pounds in last 1 year as per the patient. Patient denies any fever or chills night sweats productive cough any blood in the stools or nausea vomiting.   Pt seen for MST. Per chart review, pt ate 100% of dinner last night, yet he denies having anything to eat since admission until breakfast this AM.  He states for breakfast he ate 2 egg white omelets with tomatoes, a muffin, raisin bran, and [place] sausage. Noted that pt is homeless. He states PTA he only ate 1 time in the several days before hospitalization and that availability of food/food intake was highly variable.  Noted several missing teeth. Pt denies chewing difficulty but states food sometimes feels that it is getting stuck in his throat. This  occurrence is variable and he states that it depends on what he ate previously and how full he is feeling. He reports problems with constipation x1 year.   Pt reports that he has lost 100 lbs in the past year; unable to confirm this with chart review. Weight hx in the chart does show 30 lb weight loss (14% body weight) in the past 4 months which is significant for time frame. Severe muscle and fat wasting to upper body; did not assess legs.   Meeting needs since admission. Medications reviewed. Labs reviewed; CBGs: 62-120 mg/dL. Will order Ensure but switch to Glucerna Shake if CBGs become too high.   Diet Order:  Diet Carb Modified Fluid consistency:: Thin; Room service appropriate?: Yes  Skin:  Reviewed, no issues  Last BM:  9/15  Height:   Ht Readings from Last 1 Encounters:  04/23/15 6\' 9"  (2.057 m)    Weight:   Wt Readings from Last 1 Encounters:  04/23/15 178 lb 6.4 oz (80.922 kg)    Ideal Body Weight:  105.45 kg (kg)  BMI:  Body mass index is 19.12 kg/(m^2).  Estimated Nutritional Needs:   Kcal:  2000-2200  Protein:  80-90 grams  Fluid:  2.2-2.5 L/day  EDUCATION NEEDS:   No education needs identified at this time     Jarome Matin, RD, LDN Inpatient Clinical Dietitian Pager # [phone] After hours/weekend pager # [phone]

## 2015-04-24 NOTE — Progress Notes (Signed)
PT Cancellation Note  Patient Details Name: Javier Rivera MRN: [id number] DOB: 07-16-1961   Cancelled Treatment:    Reason Eval/Treat Not Completed: Medical issues which prohibited therapy Per RN, pt transferring to [facility] sometime today for further telemetry monitoring and EP eval.  Will check back as schedule permits.   LEMYRE,KATHrine E 04/24/2015, 2:17 PM Carmelia Bake, PT, DPT 04/24/2015 Pager: [phone]

## 2015-04-24 NOTE — Progress Notes (Signed)
Echocardiogram 2D Echocardiogram has been performed.  Javier Rivera 04/24/2015, 1:58 PM

## 2015-04-24 NOTE — Progress Notes (Signed)
Financial Advisor will see pt concerning Medicaid.

## 2015-04-24 NOTE — Progress Notes (Signed)
Spoke with Dr Ron Parker, who read the echo. See results below.  2010 Results below as well. Aorta significantly larger, have ordered CT and surgical consult called.  Echo: 09/16/20106 - Left ventricle: The cavity size was normal. Wall thickness was increased in a pattern of mild LVH. The estimated ejection fraction was 65%. Wall motion was normal; there were no regional wall motion abnormalities. - Aortic valve: Mild central AI. The leaflets are not seen well enough to say if two or three cusps. There is no AS. - Aorta: There is marked dilitation of the ascending aorta. The root measures 88mm. Sino-tubular junction 15mm. Ascending aorta 44mm. - Right atrium: The atrium was mildly dilated. - Impressions: MARKED DILITATION OF THE ASCENDING AORTA. THIS HAS PROGRESSED SINCE 2010. Impressions: - MARKED DILITATION OF THE ASCENDING AORTA. THIS HAS PROGRESSED SINCE 2010.   Echo: 06/01/2009 1. Left ventricle: The cavity size was normal. Wall thickness was  increased in a pattern of moderate LVH. Systolic function was  normal. The estimated ejection fraction was 55%. Wall motion was  normal; there were no regional wall motion abnormalities.  Features are consistent with a pseudonormal left ventricular  filling pattern, with concomitant abnormal relaxation and  increased filling pressure (grade 2 diastolic dysfunction). 2. Aortic valve: Trileaflet. There was no stenosis. Mild  regurgitation. 3. Aorta: Dilated ascending aorta to 4.4 cm. Mildly dilated aortic  root. Aortic root dimension: 76mm (ED). 4. Mitral valve: No significant regurgitation. 5. Left atrium: The atrium was mildly dilated. 6. Right ventricle: The cavity size was normal. Systolic function  was normal. 7. Pulmonary arteries: No TR doppler jet was measured so unable to  estimate PA systolic pressure. 8. Systemic veins: IVC measures 2.0 cm with normal respirophasic   variation, suggesting RA pressure 6-10 mmHg. Impressions: - Normal LV size and systolic function, EF 73%. Moderate LV  hypertrophy. Moderate diastolic dysfunction. Mild aortic  insufficiency. Mildly dilated aortic root and ascending aorta.  Findings suggestive of hypertensive heart disease.   Javier Rivera, [name] 04/24/2015 2:54 PM Beeper [phone]

## 2015-04-24 NOTE — Progress Notes (Signed)
Called report to RN on 3E.    [facility] called, transporting patient to [facility].

## 2015-04-24 NOTE — Progress Notes (Signed)
Patient Name: Javier Rivera Date of Encounter: 04/24/2015  Principal Problem:   Dizziness Active Problems:   Heart block AV second degree   Diabetes mellitus type 2, controlled   Essential hypertension   Tobacco abuse   AV block, 2nd degree   Primary Cardiologist: New, Dr Oval Linsey  Patient Profile: 54 yo M with hypertension, diabetes mellitus type II (A1c unknown ) and hyperlipidemia, tobacco abuse and homelessness, admitted 09/15 w/ dizziness, Mobitz II on telemetry.   SUBJECTIVE: Says objects were moving and room was spinning when sx were bad. Felt light-headed, but severe dizziness has improved. No chest pain or SOB. Throbbing pain in L foot  OBJECTIVE Filed Vitals:   04/23/15 1836 04/23/15 2218 04/24/15 0305 04/24/15 0530  BP: 163/95 136/89 136/81 160/93  Pulse: 66 81 40 40  Temp: 98.7 F (37.1 C) 98.3 F (36.8 C) 97.7 F (36.5 C) 97.1 F (36.2 C)  TempSrc: Oral Oral Oral Oral  Resp: 15 16 18 18   Height: 6\' 9"  (2.057 m)     Weight: 178 lb 6.4 oz (80.922 kg)     SpO2: 100% 100% 100% 100%    Intake/Output Summary (Last 24 hours) at 04/24/15 0901 Last data filed at 04/23/15 1900  Gross per 24 hour  Intake    240 ml  Output      0 ml  Net    240 ml   Filed Weights   04/23/15 1836  Weight: 178 lb 6.4 oz (80.922 kg)    PHYSICAL EXAM General: Well developed, well nourished, male in no acute distress. Head: Normocephalic, atraumatic.  Neck: Supple without bruits, JVD not elevated Lungs:  Resp regular and unlabored, CTA. Heart: RRR, S1, S2, no S3, S4, or murmur; no rub. Abdomen: Soft, non-tender, non-distended, BS + x 4.  Extremities: No clubbing, cyanosis, edema.  Neuro: Alert and oriented X 3. Moves all extremities spontaneously. Psych: Normal affect.  LABS: CBC:  Recent Labs  04/23/15 2208 04/24/15 0310  WBC 3.3* 4.1  HGB 14.9 14.1  HCT 45.0 41.5  MCV 90.9 89.8  PLT 155 563   Basic Metabolic Panel:  Recent Labs  04/23/15 0753  04/23/15 0805 04/23/15 2208 04/24/15 0310  NA 140 142  --  141  K 3.7 3.7  --  3.6  CL 105 102  --  108  CO2 29  --   --  27  GLUCOSE 87 83  --  90  BUN 13 12  --  20  CREATININE 1.01 1.00 1.08 0.96  CALCIUM 9.1  --   --  8.8*   Liver Function Tests:  Recent Labs  04/23/15 0753 04/24/15 0310  AST 14* 14*  ALT 12* 10*  ALKPHOS 66 71  BILITOT 0.8 0.6  PROT 7.2 6.5  ALBUMIN 4.2 3.8   Cardiac Enzymes:  Recent Labs  04/23/15 2208 04/24/15 0310  TROPONINI <0.03 <0.03    Recent Labs  04/23/15 0804  TROPIPOC 0.00   Fasting Lipid Panel:  Recent Labs  04/24/15 0310  CHOL 180  HDL 41  LDLCALC 124*  TRIG 74  CHOLHDL 4.4   Thyroid Function Tests:  Recent Labs  04/23/15 2208  TSH 2.649   Anemia Panel:  Recent Labs  04/23/15 2208  VITAMINB12 169*  FOLATE 13.1   Lab Results  Component Value Date   HGBA1C 7.1* 05/07/2010    TELE:  SR, Mobitz II with HR sustained in the 30s      Radiology/Studies: Dg Chest  1 View 04/24/2015   CLINICAL DATA:  54 year old male with dizziness  EXAM: CHEST  1 VIEW  COMPARISON:  Earlier Radiograph dated 04/23/2015  FINDINGS: There has been interval placement of needle markers in the region of the previously seen nodules. There is no focal consolidation, pleural effusion, or pneumothorax. The cardiac silhouette is within normal limits. The osseous structures appear unremarkable.  IMPRESSION: No active disease.  The previously described pulmonary nodule corresponds to the nipple marker.   Electronically Signed   By: Anner Crete M.D.   On: 04/24/2015 01:24   Dg Chest 2 View 04/23/2015   CLINICAL DATA:  Chest discomfort with some difficulty breathing for some time now, came in today for numbness, tingling in fingers, poor historian  EXAM: CHEST  2 VIEW  COMPARISON:  01/01/2015  FINDINGS: Stable mild hyperinflation. Heart size and vascular pattern are normal. No infiltrate or consolidation. No pleural effusions. 8 mm nodular  opacity over the left lower lobe.  IMPRESSION: No acute findings. Possible pulmonary nodule versus nipple shadow on the left. Recommend repeating the PA view with nipple markers.   Electronically Signed   By: Skipper Cliche M.D.   On: 04/23/2015 09:16   Ct Head Wo Contrast 04/23/2015   CLINICAL DATA:  Twenty-four hours post tPA dose.  EXAM: CT HEAD WITHOUT CONTRAST  TECHNIQUE: Contiguous axial images were obtained from the base of the skull through the vertex without intravenous contrast.  COMPARISON:  None.  FINDINGS: No intracranial hemorrhage, mass effect, or midline shift. No cerebral edema. There is mal periventricular and deep white matter hypodensity, most significant in the left parietal lobe, without convincing evidence of territorial infarct. No hydrocephalus. The basilar cisterns are patent. No intracranial fluid collection. Calvarium is intact. Included paranasal sinuses and mastoid air cells are well aerated.  IMPRESSION: 1. No intracranial hemorrhage. 2. Periventricular and deep white matter hypodensity, fairly symmetric, however most prominent involving left parietal lobe. Findings are likely related to chronic small vessel ischemia, there is no convincing evidence of territorial infarct. No prior exams are available for comparison.   Electronically Signed   By: Jeb Levering M.D.   On: 04/23/2015 22:08   Current Medications:  . amLODipine  5 mg Oral Daily  . aspirin  300 mg Rectal Daily   Or  . aspirin  325 mg Oral Daily  . enoxaparin (LOVENOX) injection  40 mg Subcutaneous Q24H  . insulin aspart  0-9 Units Subcutaneous TID WC   . sodium chloride 75 mL/hr at 04/24/15 0026    ASSESSMENT AND PLAN: # Mobitz I: Seemingly asymptomatic. Need to correlate dizziness with low HR - Avoid nodal agents - Continue telemetry for now. If leaving soon would get 24H Holter to assess for lowest HR and symptom association with dropped beats - K>4, Mg >2 - will supplement K+ and ck Mg  #  Hypertensive urgency: This may be the reason for his facial fullness/HA. - Rec'd Hydralazine 10mg  IV  - Restart home amlodipine, HTCZ and lisinopril per IM  # Hyperlipidema: Resume statin per IM  # Neurologic complaints: May be worthwhile to have Neuro comment on his visual disturbance this AM and whether any further TIA/stroke work up is warranted. I'm actually more concerned with his feet, which appear wasted and deformed. Seems unlikely to be due to diabetic neuropathy, especially given his glucose of 87, though no recent A1c is in the computer. Given his complaint of numbness and tingling, this could be related to a neuropathic process.  #  Dispo: Mr. Magnan needs close PCP follow up to re-establish his medications.  Principal Problem:   Dizziness Active Problems:   Heart block AV second degree   Diabetes mellitus type 2, controlled   Essential hypertension   Tobacco abuse   AV block, 2nd degree   Signed, Rosaria Ferries , PA-C 9:01 AM 04/24/2015

## 2015-04-24 NOTE — Progress Notes (Signed)
[facility] PROGRESS NOTE  Javier Rivera QHU:[id number] DOB: Dec 08, 1960 DOA: 04/23/2015 PCP: Javier Hook, MD  Assessment/Plan: Principal Problem:   Dizziness Active Problems:   Heart block AV second degree   Diabetes mellitus type 2, controlled   Essential hypertension   Tobacco abuse   AV block, 2nd degree   Protein-calorie malnutrition, severe     1. Dizziness - appreciate cardiology consult. Patient does have second-degree AV block. Cardiology at this time feel that patient may also need a neurology workup, CT negative for acute process, MRI brain pending. Since patient has been complaining of numbness in the extremities have ordered folate levels be 2 levels RPR and TSH. HIV has been negative. Physical therapy consult. 2. Hypertensive urgency -continue IV hydralazine , cardiology recommends to discontinue amlodipine 5 mg as this can exacerbate AV block. Closely follow blood pressure trends. 3. Second-degree AV block - cardiology consult requested, cardiology recommends transfer to [facility] for EP evaluation, 2-D echo, pacemaker placement? 4. History of diabetes mellitus type 2 - patient has not been taking his medications for at least 9 months now. Patient hypoglycemic this morning, therefore discontinued  sliding scale but continue Accu-Cheks, hemoglobin A1c pending 5. Hyperlipidemia - check lipid panel. 6. Tobacco abuse - tobacco cessation counseling requested. 7. History of diastolic dysfunction - 2-D echo pending 8. Weight loss - patient will need further workup on this outpatient.    DVT prophylaxsis Lovenox  Code Status:      Code Status Orders        Start     Ordered   04/23/15 2105  Full code   Continuous     04/23/15 2107     Family Communication: family updated about patient's clinical progress Disposition Plan:  Transferred to [facility] per cardiology recommendations   Brief narrative: Javier Rivera is a 54M with  hypertension, diabetes mellitus type II (A1c unknown ) and hyperlipidemia, tobacco abuse and homelessness who presented to the hospital with a complaint of vision disturbance among multiple other complaints. Cardiology was called for an incidental finding of Mobitz I on telemetry. Javier Rivera reports that this AM he had visual disturbance and weakness on his R arm. He thought he was having a stroke and that he was dying. He presented to a firestation and was referred to the ED. Upon arrival to the ED and in the fire station, there have not been any recognized neuro deficits. Javier Rivera recounts that he has experienced numbness in both feet and tingling in his arms that he attributes to being switched from glipizide to metformin. He was previously getting all his medications through a medication assistance program that is no longer available. He has not been taking any medications for 9 months.   Javier Rivera reports chronic constipation, last BM was 2 weeks ago. He has been resistant to all OTC meds including miralax, magnesium citrate, Episom salt and others. He also reports not sleeping in a year. He reports pain and wasting in bilateral feet and a 100lb weight loss.  In the ED Javier Rivera was noted to have sinus bradycardia in the 50s with occasional episodes of Mobitz II. While talking to him there were 2 episodes noted on the monitor and he was unaware of these episodes and did not report symptoms  Consultants:  Cardiology  Procedures:  None  Antibiotics: Anti-infectives    None         HPI/Subjective: Patient is somewhat better since admission with  IV fluids, alert oriented, no gross focal deficits, denies any headache or blurry vision. Hypoglycemic this morning with a CBG of 62  Objective: Filed Vitals:   04/23/15 1836 04/23/15 2218 04/24/15 0305 04/24/15 0530  BP: 163/95 136/89 136/81 160/93  Pulse: 66 81 40 40  Temp: 98.7 F (37.1 C) 98.3 F (36.8 C) 97.7 F  (36.5 C) 97.1 F (36.2 C)  TempSrc: Oral Oral Oral Oral  Resp: 15 16 18 18   Height: 6\' 9"  (2.057 m)     Weight: 80.922 kg (178 lb 6.4 oz)     SpO2: 100% 100% 100% 100%    Intake/Output Summary (Last 24 hours) at 04/24/15 1327 Last data filed at 04/24/15 1007  Gross per 24 hour  Intake    240 ml  Output    350 ml  Net   -110 ml    Exam:  General: No acute respiratory distress Lungs: Clear to auscultation bilaterally without wheezes or crackles Cardiovascular: Regular rate and rhythm without murmur gallop or rub normal S1 and S2 Abdomen: Nontender, nondistended, soft, bowel sounds positive, no rebound, no ascites, no appreciable mass Extremities: No significant cyanosis, clubbing, or edema bilateral lower extremities     Data Review   Micro Results No results found for this or any previous visit (from the past 240 hour(s)).  Radiology Reports Dg Chest 1 View  04/24/2015   CLINICAL DATA:  54 year old male with dizziness  EXAM: CHEST  1 VIEW  COMPARISON:  Earlier Radiograph dated 04/23/2015  FINDINGS: There has been interval placement of needle markers in the region of the previously seen nodules. There is no focal consolidation, pleural effusion, or pneumothorax. The cardiac silhouette is within normal limits. The osseous structures appear unremarkable.  IMPRESSION: No active disease.  The previously described pulmonary nodule corresponds to the nipple marker.   Electronically Signed   By: Anner Crete M.D.   On: 04/24/2015 01:24   Dg Chest 2 View  04/23/2015   CLINICAL DATA:  Chest discomfort with some difficulty breathing for some time now, came in today for numbness, tingling in fingers, poor historian  EXAM: CHEST  2 VIEW  COMPARISON:  01/01/2015  FINDINGS: Stable mild hyperinflation. Heart size and vascular pattern are normal. No infiltrate or consolidation. No pleural effusions. 8 mm nodular opacity over the left lower lobe.  IMPRESSION: No acute findings. Possible  pulmonary nodule versus nipple shadow on the left. Recommend repeating the PA view with nipple markers.   Electronically Signed   By: Skipper Cliche M.D.   On: 04/23/2015 09:16   Ct Head Wo Contrast  04/23/2015   CLINICAL DATA:  Twenty-four hours post tPA dose.  EXAM: CT HEAD WITHOUT CONTRAST  TECHNIQUE: Contiguous axial images were obtained from the base of the skull through the vertex without intravenous contrast.  COMPARISON:  None.  FINDINGS: No intracranial hemorrhage, mass effect, or midline shift. No cerebral edema. There is mal periventricular and deep white matter hypodensity, most significant in the left parietal lobe, without convincing evidence of territorial infarct. No hydrocephalus. The basilar cisterns are patent. No intracranial fluid collection. Calvarium is intact. Included paranasal sinuses and mastoid air cells are well aerated.  IMPRESSION: 1. No intracranial hemorrhage. 2. Periventricular and deep white matter hypodensity, fairly symmetric, however most prominent involving left parietal lobe. Findings are likely related to chronic small vessel ischemia, there is no convincing evidence of territorial infarct. No prior exams are available for comparison.   Electronically Signed   By: Threasa Beards  Ehinger M.D.   On: 04/23/2015 22:08     CBC  Recent Labs Lab 04/23/15 0753 04/23/15 0805 04/23/15 2208 04/24/15 0310  WBC 2.1*  --  3.3* 4.1  HGB 14.9 15.6 14.9 14.1  HCT 44.5 46.0 45.0 41.5  PLT 161  --  155 155  MCV 91.4  --  90.9 89.8  MCH 30.6  --  30.1 30.5  MCHC 33.5  --  33.1 34.0  RDW 12.8  --  12.6 12.7    Chemistries   Recent Labs Lab 04/23/15 0753 04/23/15 0805 04/23/15 2208 04/24/15 0310  NA 140 142  --  141  K 3.7 3.7  --  3.6  CL 105 102  --  108  CO2 29  --   --  27  GLUCOSE 87 83  --  90  BUN 13 12  --  20  CREATININE 1.01 1.00 1.08 0.96  CALCIUM 9.1  --   --  8.8*  AST 14*  --   --  14*  ALT 12*  --   --  10*  ALKPHOS 66  --   --  71  BILITOT 0.8   --   --  0.6   ------------------------------------------------------------------------------------------------------------------ estimated creatinine clearance is 101.8 mL/min (by C-G formula based on Cr of 0.96). ------------------------------------------------------------------------------------------------------------------ No results for input(s): HGBA1C in the last 72 hours. ------------------------------------------------------------------------------------------------------------------  Recent Labs  04/24/15 0310  CHOL 180  HDL 41  LDLCALC 124*  TRIG 74  CHOLHDL 4.4   ------------------------------------------------------------------------------------------------------------------  Recent Labs  04/23/15 2208  TSH 2.649   ------------------------------------------------------------------------------------------------------------------  Recent Labs  04/23/15 2208  VITAMINB12 169*  FOLATE 13.1    Coagulation profile No results for input(s): INR, PROTIME in the last 168 hours.  No results for input(s): DDIMER in the last 72 hours.  Cardiac Enzymes  Recent Labs Lab 04/23/15 2208 04/24/15 0310 04/24/15 0918  TROPONINI <0.03 <0.03 <0.03   ------------------------------------------------------------------------------------------------------------------ Invalid input(s): POCBNP   CBG:  Recent Labs Lab 04/23/15 0744 04/23/15 2220 04/24/15 0733 04/24/15 0800 04/24/15 1201  GLUCAP 84 113* 62* 120* 89       Studies: Dg Chest 1 View  04/24/2015   CLINICAL DATA:  54 year old male with dizziness  EXAM: CHEST  1 VIEW  COMPARISON:  Earlier Radiograph dated 04/23/2015  FINDINGS: There has been interval placement of needle markers in the region of the previously seen nodules. There is no focal consolidation, pleural effusion, or pneumothorax. The cardiac silhouette is within normal limits. The osseous structures appear unremarkable.  IMPRESSION: No active  disease.  The previously described pulmonary nodule corresponds to the nipple marker.   Electronically Signed   By: Anner Crete M.D.   On: 04/24/2015 01:24   Dg Chest 2 View  04/23/2015   CLINICAL DATA:  Chest discomfort with some difficulty breathing for some time now, came in today for numbness, tingling in fingers, poor historian  EXAM: CHEST  2 VIEW  COMPARISON:  01/01/2015  FINDINGS: Stable mild hyperinflation. Heart size and vascular pattern are normal. No infiltrate or consolidation. No pleural effusions. 8 mm nodular opacity over the left lower lobe.  IMPRESSION: No acute findings. Possible pulmonary nodule versus nipple shadow on the left. Recommend repeating the PA view with nipple markers.   Electronically Signed   By: Skipper Cliche M.D.   On: 04/23/2015 09:16   Ct Head Wo Contrast  04/23/2015   CLINICAL DATA:  Twenty-four hours post tPA dose.  EXAM: CT HEAD WITHOUT CONTRAST  TECHNIQUE: Contiguous axial images were obtained from the base of the skull through the vertex without intravenous contrast.  COMPARISON:  None.  FINDINGS: No intracranial hemorrhage, mass effect, or midline shift. No cerebral edema. There is mal periventricular and deep white matter hypodensity, most significant in the left parietal lobe, without convincing evidence of territorial infarct. No hydrocephalus. The basilar cisterns are patent. No intracranial fluid collection. Calvarium is intact. Included paranasal sinuses and mastoid air cells are well aerated.  IMPRESSION: 1. No intracranial hemorrhage. 2. Periventricular and deep white matter hypodensity, fairly symmetric, however most prominent involving left parietal lobe. Findings are likely related to chronic small vessel ischemia, there is no convincing evidence of territorial infarct. No prior exams are available for comparison.   Electronically Signed   By: Jeb Levering M.D.   On: 04/23/2015 22:08      Lab Results  Component Value Date   HGBA1C 7.1*  05/07/2010   HGBA1C 7.7 10/13/2009   HGBA1C 9.0 06/11/2009   Lab Results  Component Value Date   LDLCALC 124* 04/24/2015   CREATININE 0.96 04/24/2015       Scheduled Meds: . amLODipine  5 mg Oral Daily  . aspirin  300 mg Rectal Daily   Or  . aspirin  325 mg Oral Daily  . enoxaparin (LOVENOX) injection  40 mg Subcutaneous Q24H  . feeding supplement (ENSURE ENLIVE)  237 mL Oral BID BM   Continuous Infusions: . sodium chloride 75 mL/hr at 04/24/15 0026    Principal Problem:   Dizziness Active Problems:   Heart block AV second degree   Diabetes mellitus type 2, controlled   Essential hypertension   Tobacco abuse   AV block, 2nd degree   Protein-calorie malnutrition, severe    Time spent: 45 minutes   [facility] Pager [phone]. If 7PM-7AM, please contact night-coverage at [email or web address], password [facility] 04/24/2015, 1:27 PM

## 2015-04-25 ENCOUNTER — Inpatient Hospital Stay (HOSPITAL_COMMUNITY): Payer: MEDICAID

## 2015-04-25 ENCOUNTER — Inpatient Hospital Stay (HOSPITAL_COMMUNITY): Payer: Self-pay

## 2015-04-25 DIAGNOSIS — R209 Unspecified disturbances of skin sensation: Secondary | ICD-10-CM

## 2015-04-25 DIAGNOSIS — Z720 Tobacco use: Secondary | ICD-10-CM

## 2015-04-25 DIAGNOSIS — I77810 Thoracic aortic ectasia: Secondary | ICD-10-CM | POA: Insufficient documentation

## 2015-04-25 DIAGNOSIS — R208 Other disturbances of skin sensation: Secondary | ICD-10-CM

## 2015-04-25 DIAGNOSIS — E119 Type 2 diabetes mellitus without complications: Secondary | ICD-10-CM

## 2015-04-25 DIAGNOSIS — E43 Unspecified severe protein-calorie malnutrition: Secondary | ICD-10-CM

## 2015-04-25 LAB — GLUCOSE, CAPILLARY
GLUCOSE-CAPILLARY: 82 mg/dL (ref 65–99)
Glucose-Capillary: 104 mg/dL — ABNORMAL HIGH (ref 65–99)
Glucose-Capillary: 116 mg/dL — ABNORMAL HIGH (ref 65–99)
Glucose-Capillary: 78 mg/dL (ref 65–99)
Glucose-Capillary: 80 mg/dL (ref 65–99)

## 2015-04-25 LAB — HEMOGLOBIN A1C
Hgb A1c MFr Bld: 6 % — ABNORMAL HIGH (ref 4.8–5.6)
Mean Plasma Glucose: 126 mg/dL

## 2015-04-25 LAB — RPR: RPR Ser Ql: NONREACTIVE

## 2015-04-25 MED ORDER — HYDRALAZINE HCL 10 MG PO TABS
10.0000 mg | ORAL_TABLET | Freq: Three times a day (TID) | ORAL | Status: DC
  Administered 2015-04-25 – 2015-04-26 (×3): 10 mg via ORAL
  Filled 2015-04-25 (×3): qty 1

## 2015-04-25 MED ORDER — CYANOCOBALAMIN 1000 MCG/ML IJ SOLN
1000.0000 ug | INTRAMUSCULAR | Status: DC
  Administered 2015-04-25 – 2015-04-26 (×2): 1000 ug via INTRAMUSCULAR
  Filled 2015-04-25 (×4): qty 1

## 2015-04-25 MED ORDER — DOCUSATE SODIUM 100 MG PO CAPS
100.0000 mg | ORAL_CAPSULE | Freq: Two times a day (BID) | ORAL | Status: DC
  Administered 2015-04-25 – 2015-04-27 (×4): 100 mg via ORAL
  Filled 2015-04-25 (×4): qty 1

## 2015-04-25 MED ORDER — SODIUM CHLORIDE 0.9 % IV SOLN
INTRAVENOUS | Status: DC
  Administered 2015-04-25 – 2015-04-26 (×2): via INTRAVENOUS
  Filled 2015-04-25 (×5): qty 1000

## 2015-04-25 MED ORDER — BISACODYL 10 MG RE SUPP
10.0000 mg | Freq: Once | RECTAL | Status: AC
  Administered 2015-04-25: 10 mg via RECTAL
  Filled 2015-04-25: qty 1

## 2015-04-25 NOTE — Evaluation (Signed)
Physical Therapy Evaluation Patient Details Name: Day Deery MRN: [id number] DOB: 06/19/1961 Today's Date: 04/25/2015   History of Present Illness  54 year old male with history of diabetes mellitus, hypertension presented to the emergency department on 04/23/2015 with numbness of his left hand as well as his legs. The patient was also complaining of dizziness and some blurry vision intermittently for one month. The patient states that standing up from supine position makes his dizziness worse but there are no other exacerbating or alleviating factors. He is a poor historian. In the emergency department, the patient was found to have Mobitz type I AV block and bradycardia.   Clinical Impression  Pt is independent with all functional mobility. No PT services indicated. Pt encouraged to walk in hallway with nursing assist. PT signing off.    Follow Up Recommendations No PT follow up    Equipment Recommendations  None recommended by PT    Recommendations for Other Services       Precautions / Restrictions Precautions Precautions: None      Mobility  Bed Mobility Overal bed mobility: Independent                Transfers Overall transfer level: Independent Equipment used: None                Ambulation/Gait Ambulation/Gait assistance: Independent Ambulation Distance (Feet): 150 Feet Assistive device: None Gait Pattern/deviations: WFL(Within Functional Limits) Gait velocity: WFL   General Gait Details: LOB with turning but able to self correct.  Stairs            Wheelchair Mobility    Modified Rankin (Stroke Patients Only)       Balance                                             Pertinent Vitals/Pain Pain Assessment: No/denies pain    Home Living Family/patient expects to be discharged to:: Shelter/Homeless                      Prior Function Level of Independence: Independent               Hand  Dominance        Extremity/Trunk Assessment   Upper Extremity Assessment: Overall WFL for tasks assessed           Lower Extremity Assessment: Overall WFL for tasks assessed      Cervical / Trunk Assessment: Normal  Communication   Communication: No difficulties  Cognition Arousal/Alertness: Awake/alert Behavior During Therapy: WFL for tasks assessed/performed Overall Cognitive Status: No family/caregiver present to determine baseline cognitive functioning                      General Comments      Exercises        Assessment/Plan    PT Assessment Patent does not need any further PT services  PT Diagnosis Difficulty walking   PT Problem List    PT Treatment Interventions     PT Goals (Current goals can be found in the Care Plan section) Acute Rehab PT Goals Patient Stated Goal: not stated PT Goal Formulation: All assessment and education complete, DC therapy    Frequency     Barriers to discharge        Co-evaluation  End of Session Equipment Utilized During Treatment: Gait belt Activity Tolerance: Patient tolerated treatment well Patient left: in bed;with call bell/phone within reach Nurse Communication: Mobility status         Time: 3009-2330 PT Time Calculation (min) (ACUTE ONLY): 17 min   Charges:   PT Evaluation $Initial PT Evaluation Tier I: 1 Procedure     PT G Codes:        Lorriane Shire 04/25/2015, 2:41 PM

## 2015-04-25 NOTE — Progress Notes (Signed)
PROGRESS NOTE  Javier Rivera YCX:[id number] DOB: May 16, 1961 DOA: 04/23/2015 PCP: Mack Hook, MD  Brief history 54 year old male with history of diabetes mellitus, hypertension presented to the emergency department on 04/23/2015 with numbness of his left hand as well as his legs. The patient was also complaining of dizziness and some blurry vision intermittently for one month. The patient states that standing up from supine position makes his dizziness worse but there are no other exacerbating or alleviating factors. He is a poor historian. In the emergency department, the patient was found to have Mobitz type I AV block and bradycardia. Cardiology was consulted. The patient was also noted to have hypertensive urgency with blood pressure as high as 174/99. The patient was initially started on amlodipine which has been since discontinued in part due to his bradycardia which was as low as the 30s. Echocardiogram was performed and revealed significant dilatation of the ascending aorta up to 68 mm. As a result, CT angio of the chest was obtained to clarify and revealed descending thoracic aorta to be 46 mm.  Thoracic surgery has been consulted. Assessment/Plan: Dizziness and sensory disturbance -MRI of the brain results are pending -Probably attributable to the patient's low serum B12 -Physical therapy -HIV and RPR negative -HbA1C= 6.0 -replace B12--1000 g daily 7 days then once per week 1 month, then once monthly -folic 49.7 -UDS neg -Check orthostatics -TSH 2.649 Hypertensive urgency  -case discussed with Dr. Radford Pax in light of pt's significant bradycardia -started hydralazine -Prior to admission, patient had not been taking his medications Mobitz type I AV block  -appreciate cardiology followup -Echo-EF 65%, no WMA -avoiding AV nodal blocking agents -continue tele -TSH 2.649 Diabetes mellitus type 2, controlled  -Has not been on any medications for his 9 months now    -Hemoglobin A1c 6.0  -CBGs have likely improve secondary to the patient's recent weight loss  -has not taken glipizide in over one month, does not like the way it makes him feel Atypical chest pain  -Troponins negative 3  -EKG without any concerning ST-T wave changes  Hyperlipidemia  -restart statin  -LDL 124  -Patient has not taken any of his medications for several months  Tobacco abuse  -Tobacco cessation discussed   Severe protein malnutrition -Continue nutritional supplementation  Family Communication:   Pt at beside Disposition Plan:   Home when medically stable     Procedures/Studies: Dg Chest 1 View  04/24/2015   CLINICAL DATA:  54 year old male with dizziness  EXAM: CHEST  1 VIEW  COMPARISON:  Earlier Radiograph dated 04/23/2015  FINDINGS: There has been interval placement of needle markers in the region of the previously seen nodules. There is no focal consolidation, pleural effusion, or pneumothorax. The cardiac silhouette is within normal limits. The osseous structures appear unremarkable.  IMPRESSION: No active disease.  The previously described pulmonary nodule corresponds to the nipple marker.   Electronically Signed   By: Anner Crete M.D.   On: 04/24/2015 01:24   Dg Chest 2 View  04/23/2015   CLINICAL DATA:  Chest discomfort with some difficulty breathing for some time now, came in today for numbness, tingling in fingers, poor historian  EXAM: CHEST  2 VIEW  COMPARISON:  01/01/2015  FINDINGS: Stable mild hyperinflation. Heart size and vascular pattern are normal. No infiltrate or consolidation. No pleural effusions. 8 mm nodular opacity over the left lower lobe.  IMPRESSION: No acute findings. Possible pulmonary nodule versus nipple shadow  on the left. Recommend repeating the PA view with nipple markers.   Electronically Signed   By: Skipper Cliche M.D.   On: 04/23/2015 09:16   Ct Head Wo Contrast  04/23/2015   CLINICAL DATA:  Twenty-four hours post tPA dose.   EXAM: CT HEAD WITHOUT CONTRAST  TECHNIQUE: Contiguous axial images were obtained from the base of the skull through the vertex without intravenous contrast.  COMPARISON:  None.  FINDINGS: No intracranial hemorrhage, mass effect, or midline shift. No cerebral edema. There is mal periventricular and deep white matter hypodensity, most significant in the left parietal lobe, without convincing evidence of territorial infarct. No hydrocephalus. The basilar cisterns are patent. No intracranial fluid collection. Calvarium is intact. Included paranasal sinuses and mastoid air cells are well aerated.  IMPRESSION: 1. No intracranial hemorrhage. 2. Periventricular and deep white matter hypodensity, fairly symmetric, however most prominent involving left parietal lobe. Findings are likely related to chronic small vessel ischemia, there is no convincing evidence of territorial infarct. No prior exams are available for comparison.   Electronically Signed   By: Jeb Levering M.D.   On: 04/23/2015 22:08   Ct Angio Chest Aorta W/cm &/or Wo/cm  04/24/2015   CLINICAL DATA:  History of hypertension, evaluate for thoracic aortic aneurysm.  EXAM: CT ANGIOGRAPHY CHEST WITH CONTRAST  TECHNIQUE: Multidetector CT imaging of the chest was performed using the standard protocol during bolus administration of intravenous contrast. Multiplanar CT image reconstructions and MIPs were obtained to evaluate the vascular anatomy.  CONTRAST:  150mL OMNIPAQUE IOHEXOL 350 MG/ML SOLN  COMPARISON:  Chest radiograph - 04/23/2015  FINDINGS: Vascular Findings:  There is mild aneurysmal dilatation of the aortic root and ascending thoracic aorta with measurements as follows.  Review of the precontrast images are negative for the presence of an intramural hematoma. No evidence of thoracic aortic dissection on this nongated examination. Conventional configuration of the aortic arch. The branch vessels of the aortic arch appear widely patent throughout their  imaged course.  Although this examination was not tailored for the evaluation of the pulmonary arteries, there are no discrete filling defects within the central pulmonary arterial tree to suggest central pulmonary embolism. Normal caliber of the main pulmonary artery.  Borderline cardiomegaly. Minimal coronary artery calcifications. No pericardial effusion.  -------------------------------------------------------------  Thoracic aortic measurements:  Aortic root:  48 mm in greatest oblique coronal diameter  Sinotubular junction  46 mm as measured in greatest oblique coronal dimension.  Proximal ascending aorta  46 mm as measured in greatest oblique axial dimension at the level of the main pulmonary artery an approximately 45 mm in greatest oblique coronal diameter (coronal image 49, series 602).  Aortic arch aorta  31 mm as measured in greatest oblique sagittal dimension.  Proximal descending thoracic aorta  31 mm as measured in greatest oblique axial dimension at the level of the main pulmonary artery.  Distal descending thoracic aorta  28 mm as measured in greatest oblique axial dimension at the level of the diaphragmatic hiatus.  Review of the MIP images confirms the above findings.  -------------------------------------------------------------  Non-Vascular Findings:  Minimal dependent subpleural ground-glass atelectasis within the dependent portions of the bilateral lower lobes, right greater than left. No focal airspace opacities. No pleural effusion or pneumothorax. The central pulmonary airways appear widely patent.  No discrete pulmonary nodules.  There is a minimal amount of ill-defined soft tissue within the anterior mediastinum favored to represent residual thymic tissue. No mediastinal, hilar axillary lymphadenopathy.  Limited early  arterial phase evaluation of the upper abdomen demonstrates an approximately 4.1 x 2.8 cm lesion within the subcapsular aspect of the dome of the lateral segment of the  left lobe of the liver which demonstrates peripheral nodular enhancement compatible with a hemangioma (image 88, series 6).  There are bilateral hypo attenuating adrenal nodule/masses with dominant right-sided mass measuring 3.5 x 2.7 cm (image 61, series 3 and dominant left-sided adrenal nodule measuring 2.1 x 1.4 cm (61, series 3). Both of these nodules are hypo attenuating (right-sided adrenal mass measures -5 Hounsfield units and left-sided adrenal nodule measures 0 Hounsfield units) and as such, both are characterized as benign adrenal adenomas.  Note is made of an approximately 2.6 cm hypo attenuating lesion arising from the superior pole the left kidney which is incompletely imaged though likely represents a renal cyst (image 104, series 6). There is an additional approximately 1 cm hypo attenuating lesion arising from the superior pole the right kidney which is also incompletely imaged and characterized on this examination though also favored to represent a renal cyst.  No acute or aggressive osseous abnormalities. 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  Regional soft tissues appear normal. Normal appearance of the thyroid gland.  IMPRESSION: 1. 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 No evidence of thoracic aortic dissection or periaortic stranding. Recommend semi-annual imaging followup by CTA or MRA and referral to cardiothoracic surgery if not already obtained. This recommendation follows 2010 ACCF/AHA/AATS/ACR/ASA/SCA/SCAI/SIR/STS/SVM Guidelines for the Diagnosis and Management of Patients With Thoracic Aortic Disease. Circulation. 2010; 121: T035-W656 2. Borderline cardiomegaly. 3. Coronary artery calcifications. 4. Incidentally noted hepatic hemangioma and bilateral benign adrenal adenomas.   Electronically Signed   By: Sandi Mariscal M.D.   On: 04/24/2015  16:28         Subjective:   Objective: Filed Vitals:   04/24/15 2348 04/25/15 0510 04/25/15 0822 04/25/15 1225  BP: 138/63 130/97 130/96 139/93  Pulse: 66 61 60 59  Temp: 97.6 F (36.4 C) 97.7 F (36.5 C) 97.6 F (36.4 C) 97.7 F (36.5 C)  TempSrc: Oral Oral Oral Oral  Resp: 18 16 16    Height:      Weight:  81.466 kg (179 lb 9.6 oz)    SpO2: 98% 100% 98% 100%    Intake/Output Summary (Last 24 hours) at 04/25/15 1259 Last data filed at 04/25/15 0935  Gross per 24 hour  Intake    600 ml  Output      0 ml  Net    600 ml   Weight change: 1.633 kg (3 lb 9.6 oz) Exam:   General:  Pt is alert, follows commands appropriately, not in acute distress  HEENT: No icterus, No thrush, No neck mass, [place]/AT  Cardiovascular: RRR, S1/S2, no rubs, no gallops  Respiratory: CTA bilaterally, no wheezing, no crackles, no rhonchi  Abdomen: Soft/+BS, non tender, non distended, no guarding  Extremities: No edema, No lymphangitis, No petechiae, No rashes, no synovitis  Data Reviewed: Basic Metabolic Panel:  Recent Labs Lab 04/23/15 0753 04/23/15 0805 04/23/15 2208 04/24/15 0310  NA 140 142  --  141  K 3.7 3.7  --  3.6  CL 105 102  --  108  CO2 29  --   --  27  GLUCOSE 87 83  --  90  BUN 13 12  --  20  CREATININE 1.01  1.00 1.08 0.96  CALCIUM 9.1  --   --  8.8*   Liver Function Tests:  Recent Labs Lab 04/23/15 0753 04/24/15 0310  AST 14* 14*  ALT 12* 10*  ALKPHOS 66 71  BILITOT 0.8 0.6  PROT 7.2 6.5  ALBUMIN 4.2 3.8    Recent Labs Lab 04/23/15 0753  LIPASE 19*   No results for input(s): AMMONIA in the last 168 hours. CBC:  Recent Labs Lab 04/23/15 0753 04/23/15 0805 04/23/15 2208 04/24/15 0310  WBC 2.1*  --  3.3* 4.1  HGB 14.9 15.6 14.9 14.1  HCT 44.5 46.0 45.0 41.5  MCV 91.4  --  90.9 89.8  PLT 161  --  155 155   Cardiac Enzymes:  Recent Labs Lab 04/23/15 2208 04/24/15 0310 04/24/15 0918  TROPONINI <0.03 <0.03 <0.03   BNP: Invalid  input(s): POCBNP CBG:  Recent Labs Lab 04/24/15 1201 04/24/15 1715 04/24/15 2154 04/25/15 0553 04/25/15 1222  GLUCAP 89 85 91 82 78    No results found for this or any previous visit (from the past 240 hour(s)).   Scheduled Meds: . aspirin  300 mg Rectal Daily   Or  . aspirin  325 mg Oral Daily  . enoxaparin (LOVENOX) injection  40 mg Subcutaneous Q24H  . feeding supplement (ENSURE ENLIVE)  237 mL Oral BID BM   Continuous Infusions:    TAT, Marysol Wellnitz, DO  [facility] Pager [phone]  If 7PM-7AM, please contact night-coverage [email or web address] Password [email or web address] 04/25/2015, 12:59 PM   LOS: 1 day

## 2015-04-25 NOTE — Progress Notes (Signed)
SUBJECTIVE:  No complaints  OBJECTIVE:   Vitals:   Filed Vitals:   04/24/15 2152 04/24/15 2348 04/25/15 0510 04/25/15 0822  BP: 140/69 138/63 130/97 130/96  Pulse: 63 66 61 60  Temp: 97.9 F (36.6 C) 97.6 F (36.4 C) 97.7 F (36.5 C) 97.6 F (36.4 C)  TempSrc: Oral Oral Oral Oral  Resp: 18 18 16 16   Height:      Weight:   179 lb 9.6 oz (81.466 kg)   SpO2: 100% 98% 100% 98%   I&O's:   Intake/Output Summary (Last 24 hours) at 04/25/15 1224 Last data filed at 04/25/15 0935  Gross per 24 hour  Intake    600 ml  Output      0 ml  Net    600 ml   TELEMETRY: Reviewed telemetry pt in NSR with intermittent Type I second degree AV block    PHYSICAL EXAM General: Well developed, well nourished, in no acute distress Head: Eyes PERRLA, No xanthomas.   Normal cephalic and atramatic  Lungs:   Clear bilaterally to auscultation and percussion. Heart:   HRRR S1 S2 Pulses are 2+ & equal. Abdomen: Bowel sounds are positive, abdomen soft and non-tender without masses  Extremities:   No clubbing, cyanosis or edema.  DP +1 Neuro: Alert and oriented X 3. Psych:  Good affect, responds appropriately   LABS: Basic Metabolic Panel:  Recent Labs  04/23/15 0753 04/23/15 0805 04/23/15 2208 04/24/15 0310  NA 140 142  --  141  K 3.7 3.7  --  3.6  CL 105 102  --  108  CO2 29  --   --  27  GLUCOSE 87 83  --  90  BUN 13 12  --  20  CREATININE 1.01 1.00 1.08 0.96  CALCIUM 9.1  --   --  8.8*   Liver Function Tests:  Recent Labs  04/23/15 0753 04/24/15 0310  AST 14* 14*  ALT 12* 10*  ALKPHOS 66 71  BILITOT 0.8 0.6  PROT 7.2 6.5  ALBUMIN 4.2 3.8    Recent Labs  04/23/15 0753  LIPASE 19*   CBC:  Recent Labs  04/23/15 2208 04/24/15 0310  WBC 3.3* 4.1  HGB 14.9 14.1  HCT 45.0 41.5  MCV 90.9 89.8  PLT 155 155   Cardiac Enzymes:  Recent Labs  04/23/15 2208 04/24/15 0310 04/24/15 0918  TROPONINI <0.03 <0.03 <0.03   BNP: Invalid input(s): POCBNP D-Dimer: No  results for input(s): DDIMER in the last 72 hours. Hemoglobin A1C:  Recent Labs  04/24/15 0310  HGBA1C 6.0*   Fasting Lipid Panel:  Recent Labs  04/24/15 0310  CHOL 180  HDL 41  LDLCALC 124*  TRIG 74  CHOLHDL 4.4   Thyroid Function Tests:  Recent Labs  04/23/15 2208  TSH 2.649   Anemia Panel:  Recent Labs  04/23/15 2208  VITAMINB12 169*  FOLATE 13.1   Coag Panel:   Lab Results  Component Value Date   INR 1.0 07/05/2007    RADIOLOGY: Dg Chest 1 View  04/24/2015   CLINICAL DATA:  54 year old male with dizziness  EXAM: CHEST  1 VIEW  COMPARISON:  Earlier Radiograph dated 04/23/2015  FINDINGS: There has been interval placement of needle markers in the region of the previously seen nodules. There is no focal consolidation, pleural effusion, or pneumothorax. The cardiac silhouette is within normal limits. The osseous structures appear unremarkable.  IMPRESSION: No active disease.  The previously described pulmonary nodule corresponds  to the nipple marker.   Electronically Signed   By: Anner Crete M.D.   On: 04/24/2015 01:24   Dg Chest 2 View  04/23/2015   CLINICAL DATA:  Chest discomfort with some difficulty breathing for some time now, came in today for numbness, tingling in fingers, poor historian  EXAM: CHEST  2 VIEW  COMPARISON:  01/01/2015  FINDINGS: Stable mild hyperinflation. Heart size and vascular pattern are normal. No infiltrate or consolidation. No pleural effusions. 8 mm nodular opacity over the left lower lobe.  IMPRESSION: No acute findings. Possible pulmonary nodule versus nipple shadow on the left. Recommend repeating the PA view with nipple markers.   Electronically Signed   By: Skipper Cliche M.D.   On: 04/23/2015 09:16   Ct Head Wo Contrast  04/23/2015   CLINICAL DATA:  Twenty-four hours post tPA dose.  EXAM: CT HEAD WITHOUT CONTRAST  TECHNIQUE: Contiguous axial images were obtained from the base of the skull through the vertex without intravenous  contrast.  COMPARISON:  None.  FINDINGS: No intracranial hemorrhage, mass effect, or midline shift. No cerebral edema. There is mal periventricular and deep white matter hypodensity, most significant in the left parietal lobe, without convincing evidence of territorial infarct. No hydrocephalus. The basilar cisterns are patent. No intracranial fluid collection. Calvarium is intact. Included paranasal sinuses and mastoid air cells are well aerated.  IMPRESSION: 1. No intracranial hemorrhage. 2. Periventricular and deep white matter hypodensity, fairly symmetric, however most prominent involving left parietal lobe. Findings are likely related to chronic small vessel ischemia, there is no convincing evidence of territorial infarct. No prior exams are available for comparison.   Electronically Signed   By: Jeb Levering M.D.   On: 04/23/2015 22:08   Ct Angio Chest Aorta W/cm &/or Wo/cm  04/24/2015   CLINICAL DATA:  History of hypertension, evaluate for thoracic aortic aneurysm.  EXAM: CT ANGIOGRAPHY CHEST WITH CONTRAST  TECHNIQUE: Multidetector CT imaging of the chest was performed using the standard protocol during bolus administration of intravenous contrast. Multiplanar CT image reconstructions and MIPs were obtained to evaluate the vascular anatomy.  CONTRAST:  165mL OMNIPAQUE IOHEXOL 350 MG/ML SOLN  COMPARISON:  Chest radiograph - 04/23/2015  FINDINGS: Vascular Findings:  There is mild aneurysmal dilatation of the aortic root and ascending thoracic aorta with measurements as follows.  Review of the precontrast images are negative for the presence of an intramural hematoma. No evidence of thoracic aortic dissection on this nongated examination. Conventional configuration of the aortic arch. The branch vessels of the aortic arch appear widely patent throughout their imaged course.  Although this examination was not tailored for the evaluation of the pulmonary arteries, there are no discrete filling defects  within the central pulmonary arterial tree to suggest central pulmonary embolism. Normal caliber of the main pulmonary artery.  Borderline cardiomegaly. Minimal coronary artery calcifications. No pericardial effusion.  -------------------------------------------------------------  Thoracic aortic measurements:  Aortic root:  48 mm in greatest oblique coronal diameter  Sinotubular junction  46 mm as measured in greatest oblique coronal dimension.  Proximal ascending aorta  46 mm as measured in greatest oblique axial dimension at the level of the main pulmonary artery an approximately 45 mm in greatest oblique coronal diameter (coronal image 49, series 602).  Aortic arch aorta  31 mm as measured in greatest oblique sagittal dimension.  Proximal descending thoracic aorta  31 mm as measured in greatest oblique axial dimension at the level of the main pulmonary artery.  Distal descending  thoracic aorta  28 mm as measured in greatest oblique axial dimension at the level of the diaphragmatic hiatus.  Review of the MIP images confirms the above findings.  -------------------------------------------------------------  Non-Vascular Findings:  Minimal dependent subpleural ground-glass atelectasis within the dependent portions of the bilateral lower lobes, right greater than left. No focal airspace opacities. No pleural effusion or pneumothorax. The central pulmonary airways appear widely patent.  No discrete pulmonary nodules.  There is a minimal amount of ill-defined soft tissue within the anterior mediastinum favored to represent residual thymic tissue. No mediastinal, hilar axillary lymphadenopathy.  Limited early arterial phase evaluation of the upper abdomen demonstrates an approximately 4.1 x 2.8 cm lesion within the subcapsular aspect of the dome of the lateral segment of the left lobe of the liver which demonstrates peripheral nodular enhancement compatible with a hemangioma (image 88, series 6).  There are bilateral  hypo attenuating adrenal nodule/masses with dominant right-sided mass measuring 3.5 x 2.7 cm (image 61, series 3 and dominant left-sided adrenal nodule measuring 2.1 x 1.4 cm (61, series 3). Both of these nodules are hypo attenuating (right-sided adrenal mass measures -5 Hounsfield units and left-sided adrenal nodule measures 0 Hounsfield units) and as such, both are characterized as benign adrenal adenomas.  Note is made of an approximately 2.6 cm hypo attenuating lesion arising from the superior pole the left kidney which is incompletely imaged though likely represents a renal cyst (image 104, series 6). There is an additional approximately 1 cm hypo attenuating lesion arising from the superior pole the right kidney which is also incompletely imaged and characterized on this examination though also favored to represent a renal cyst.  No acute or aggressive osseous abnormalities. 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  Regional soft tissues appear normal. Normal appearance of the thyroid gland.  IMPRESSION: 1. 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 No evidence of thoracic aortic dissection or periaortic stranding. Recommend semi-annual imaging followup by CTA or MRA and referral to cardiothoracic surgery if not already obtained. This recommendation follows 2010 ACCF/AHA/AATS/ACR/ASA/SCA/SCAI/SIR/STS/SVM Guidelines for the Diagnosis and Management of Patients With Thoracic Aortic Disease. Circulation. 2010; 121: Z124-P809 2. Borderline cardiomegaly. 3. Coronary artery calcifications. 4. Incidentally noted hepatic hemangioma and bilateral benign adrenal adenomas.   Electronically Signed   By: Sandi Mariscal M.D.   On: 04/24/2015 16:28   ASSESSMENT AND PLAN: # Mobitz I: Seemingly asymptomatic. Need to correlate dizziness with low HR.  Some tele strips are clearly Mobitz I  second degree AV block with progressive PR prolongation and then a dropped QRS but difficult to say if the 2:1 block episodes are type I or Type II. I did find a few episodes where there was no PR prolongation associated with dropped QRS complexes which would be more consistent with Type II. His HR drops into the mid 30's and he has been symptomatic with dizziness and some TIA like symptoms. He is a very poor historian and difficult to keep on task at hand and apparently did not complain at all when Dr. Oval Linsey was seeing him and had episodes of heart block at that time but says that he has been having episodes of lightheadedness and feeling like he is going to pass out.  Will ask EP to see. - Avoid nodal agents - Continue telemetry for now. - keep K>4, Mg >2   # Hypertensive urgency:  This may be the reason for his facial fullness/HA. - start  Hydralazine 10mg  TID - avoid BB/CCB due to intermittent heart block  # Hyperlipidema: Resume statin per IM  # Neurologic complaints: This may be due to low B12 but also need to consider TIA especially given his aortic vascular disease .He had MRI of head this am and results are pending.   # Ascending aortic anerusym - significant discrepancy between the CT scan (4.6cm) and echo (6.8cm ascending aortic aneurysm).  Will review echo findings. He needs aggressive BP control.  Will set up to see CVTS.   Principal Problem:  Dizziness Active Problems:  Heart block AV second degree  Diabetes mellitus type 2, controlled  Essential hypertension  Tobacco abuse  AV block, 2nd degree      Sueanne Margarita, MD  04/25/2015  12:24 PM

## 2015-04-26 DIAGNOSIS — I495 Sick sinus syndrome: Secondary | ICD-10-CM

## 2015-04-26 LAB — CBC
HCT: 40.8 % (ref 39.0–52.0)
HEMOGLOBIN: 13.7 g/dL (ref 13.0–17.0)
MCH: 30.6 pg (ref 26.0–34.0)
MCHC: 33.6 g/dL (ref 30.0–36.0)
MCV: 91.1 fL (ref 78.0–100.0)
PLATELETS: 149 10*3/uL — AB (ref 150–400)
RBC: 4.48 MIL/uL (ref 4.22–5.81)
RDW: 12.8 % (ref 11.5–15.5)
WBC: 2.7 10*3/uL — ABNORMAL LOW (ref 4.0–10.5)

## 2015-04-26 LAB — BASIC METABOLIC PANEL
Anion gap: 6 (ref 5–15)
BUN: 16 mg/dL (ref 6–20)
CALCIUM: 8.7 mg/dL — AB (ref 8.9–10.3)
CO2: 26 mmol/L (ref 22–32)
CREATININE: 1 mg/dL (ref 0.61–1.24)
Chloride: 108 mmol/L (ref 101–111)
GFR calc non Af Amer: >60 mL/min (ref >60)
Glucose, Bld: 95 mg/dL (ref 65–99)
Potassium: 4.1 mmol/L (ref 3.5–5.1)
SODIUM: 140 mmol/L (ref 135–145)

## 2015-04-26 LAB — GLUCOSE, CAPILLARY
GLUCOSE-CAPILLARY: 78 mg/dL (ref 65–99)
GLUCOSE-CAPILLARY: 112 mg/dL — AB (ref 65–99)
Glucose-Capillary: 102 mg/dL — ABNORMAL HIGH (ref 65–99)
Glucose-Capillary: 97 mg/dL (ref 65–99)

## 2015-04-26 MED ORDER — SIMVASTATIN 10 MG PO TABS
10.0000 mg | ORAL_TABLET | Freq: Every day | ORAL | Status: DC
  Administered 2015-04-26: 10 mg via ORAL
  Filled 2015-04-26: qty 1

## 2015-04-26 MED ORDER — HYDRALAZINE HCL 25 MG PO TABS
25.0000 mg | ORAL_TABLET | Freq: Three times a day (TID) | ORAL | Status: DC
  Administered 2015-04-26 – 2015-04-27 (×3): 25 mg via ORAL
  Filled 2015-04-26 (×3): qty 1

## 2015-04-26 NOTE — Progress Notes (Signed)
PROGRESS NOTE  Javier Rivera GXQ:[id number] DOB: [birth year] DOA: 04/23/2015 PCP: Mack Hook, MD   Brief history 54 year old male with history of diabetes mellitus, hypertension presented to the emergency department on 04/23/2015 with numbness of his left hand as well as his legs. The patient was also complaining of dizziness and some blurry vision intermittently for one month. The patient states that standing up from supine position makes his dizziness worse but there are no other exacerbating or alleviating factors. He is a poor historian. In the emergency department, the patient was found to have Mobitz type I AV block and bradycardia. Cardiology was consulted. The patient was also noted to have hypertensive urgency with blood pressure as high as 174/99. The patient was initially started on amlodipine which has been since discontinued in part due to his bradycardia which was as low as the 30s. Echocardiogram was performed and revealed significant dilatation of the ascending aorta up to 68 mm. As a result, CT angio of the chest was obtained to clarify and revealed descending thoracic aorta to be 46 mm. Thoracic surgery has been consulted. Assessment/Plan: Dizziness and sensory disturbance -MRI of the brain--small vessel disease -Probably attributable to the patient's low serum B12 -Physical therapy--no follow up needed -HIV and RPR negative -HbA1C= 6.0 -replace B12--1000 g daily 7 days then once per week 1 month, then once monthly -folic 14.4 -UDS neg -Check orthostatics--positive -TSH 2.649 Mobitz type I AV block  -appreciate cardiology followup -Echo-EF 65%, no WMA -avoiding AV nodal blocking agents -continue tele -TSH 2.649 -appreciate EP input--no indication for PPM presently Aortic Root dilatation -Appreciate Dr. Berton Bon indication for aortic root replacement but needsd serial CT chest followup -elevated Z-score but does not meet criteria for  marfan's Orthostasis -continue IVF -recheck in am Hypertensive urgency  -increased hydralazine -Prior to admission, patient had not been taking his medications -avoiding AV nodal agents Diabetes mellitus type 2, controlled  -Has not been on any medications for his 9 months now  -Hemoglobin A1c 6.0  -CBGs have likely improve secondary to the patient's recent weight loss  -has not taken glipizide in over one month, does not like the way it makes him feel Atypical chest pain  -Troponins negative 3  -EKG without any concerning ST-T wave changes  Hyperlipidemia  -restart statin  -LDL 124  -Patient has not taken any of his medications for several months  Tobacco abuse  -Tobacco cessation discussed   Severe protein malnutrition -Continue nutritional supplementation  Family Communication: Pt at beside Disposition Plan: Home when cleared by cardiology       Procedures/Studies: Dg Chest 1 View  04/24/2015   CLINICAL DATA:  54 year old male with dizziness  EXAM: CHEST  1 VIEW  COMPARISON:  Earlier Radiograph dated 04/23/2015  FINDINGS: There has been interval placement of needle markers in the region of the previously seen nodules. There is no focal consolidation, pleural effusion, or pneumothorax. The cardiac silhouette is within normal limits. The osseous structures appear unremarkable.  IMPRESSION: No active disease.  The previously described pulmonary nodule corresponds to the nipple marker.   Electronically Signed   By: Anner Crete M.D.   On: 04/24/2015 01:24   Dg Chest 2 View  04/23/2015   CLINICAL DATA:  Chest discomfort with some difficulty breathing for some time now, came in today for numbness, tingling in fingers, poor historian  EXAM: CHEST  2 VIEW  COMPARISON:  01/01/2015  FINDINGS: Stable mild hyperinflation. Heart size  and vascular pattern are normal. No infiltrate or consolidation. No pleural effusions. 8 mm nodular opacity over the left lower lobe.   IMPRESSION: No acute findings. Possible pulmonary nodule versus nipple shadow on the left. Recommend repeating the PA view with nipple markers.   Electronically Signed   By: Skipper Cliche M.D.   On: 04/23/2015 09:16   Ct Head Wo Contrast  04/23/2015   CLINICAL DATA:  Twenty-four hours post tPA dose.  EXAM: CT HEAD WITHOUT CONTRAST  TECHNIQUE: Contiguous axial images were obtained from the base of the skull through the vertex without intravenous contrast.  COMPARISON:  None.  FINDINGS: No intracranial hemorrhage, mass effect, or midline shift. No cerebral edema. There is mal periventricular and deep white matter hypodensity, most significant in the left parietal lobe, without convincing evidence of territorial infarct. No hydrocephalus. The basilar cisterns are patent. No intracranial fluid collection. Calvarium is intact. Included paranasal sinuses and mastoid air cells are well aerated.  IMPRESSION: 1. No intracranial hemorrhage. 2. Periventricular and deep white matter hypodensity, fairly symmetric, however most prominent involving left parietal lobe. Findings are likely related to chronic small vessel ischemia, there is no convincing evidence of territorial infarct. No prior exams are available for comparison.   Electronically Signed   By: Jeb Levering M.D.   On: 04/23/2015 22:08   Mr Brain Wo Contrast  04/25/2015   CLINICAL DATA:  Acute presentation with visual disturbance treated with tPA. Followup. Presentation on 04/23/2015.  EXAM: MRI HEAD WITHOUT CONTRAST  TECHNIQUE: Multiplanar, multiecho pulse sequences of the brain and surrounding structures were obtained without intravenous contrast.  COMPARISON:  04/23/2015.  FINDINGS: Diffusion imaging does not show any acute or subacute infarction. There chronic small-vessel ischemic changes of the pons. No focal cerebellar infarction. Cerebral hemispheres show moderate changes of chronic small vessel disease throughout the deep and subcortical white  matter, markedly advanced for age. No cortical or large vessel territory infarction. No mass lesion, hemorrhage, hydrocephalus or extra-axial collection. No pituitary mass. No inflammatory sinus disease. No skull or skullbase lesion.  IMPRESSION: No acute or subacute insult. Chronic small-vessel ischemic changes affecting the brainstem in the cerebral hemispheric white matter, markedly advanced for age.   Electronically Signed   By: Nelson Chimes M.D.   On: 04/25/2015 13:28   Ct Angio Chest Aorta W/cm &/or Wo/cm  04/24/2015   CLINICAL DATA:  History of hypertension, evaluate for thoracic aortic aneurysm.  EXAM: CT ANGIOGRAPHY CHEST WITH CONTRAST  TECHNIQUE: Multidetector CT imaging of the chest was performed using the standard protocol during bolus administration of intravenous contrast. Multiplanar CT image reconstructions and MIPs were obtained to evaluate the vascular anatomy.  CONTRAST:  173mL OMNIPAQUE IOHEXOL 350 MG/ML SOLN  COMPARISON:  Chest radiograph - 04/23/2015  FINDINGS: Vascular Findings:  There is mild aneurysmal dilatation of the aortic root and ascending thoracic aorta with measurements as follows.  Review of the precontrast images are negative for the presence of an intramural hematoma. No evidence of thoracic aortic dissection on this nongated examination. Conventional configuration of the aortic arch. The branch vessels of the aortic arch appear widely patent throughout their imaged course.  Although this examination was not tailored for the evaluation of the pulmonary arteries, there are no discrete filling defects within the central pulmonary arterial tree to suggest central pulmonary embolism. Normal caliber of the main pulmonary artery.  Borderline cardiomegaly. Minimal coronary artery calcifications. No pericardial effusion.  -------------------------------------------------------------  Thoracic aortic measurements:  Aortic root:  48 mm  in greatest oblique coronal diameter  Sinotubular  junction  46 mm as measured in greatest oblique coronal dimension.  Proximal ascending aorta  46 mm as measured in greatest oblique axial dimension at the level of the main pulmonary artery an approximately 45 mm in greatest oblique coronal diameter (coronal image 49, series 602).  Aortic arch aorta  31 mm as measured in greatest oblique sagittal dimension.  Proximal descending thoracic aorta  31 mm as measured in greatest oblique axial dimension at the level of the main pulmonary artery.  Distal descending thoracic aorta  28 mm as measured in greatest oblique axial dimension at the level of the diaphragmatic hiatus.  Review of the MIP images confirms the above findings.  -------------------------------------------------------------  Non-Vascular Findings:  Minimal dependent subpleural ground-glass atelectasis within the dependent portions of the bilateral lower lobes, right greater than left. No focal airspace opacities. No pleural effusion or pneumothorax. The central pulmonary airways appear widely patent.  No discrete pulmonary nodules.  There is a minimal amount of ill-defined soft tissue within the anterior mediastinum favored to represent residual thymic tissue. No mediastinal, hilar axillary lymphadenopathy.  Limited early arterial phase evaluation of the upper abdomen demonstrates an approximately 4.1 x 2.8 cm lesion within the subcapsular aspect of the dome of the lateral segment of the left lobe of the liver which demonstrates peripheral nodular enhancement compatible with a hemangioma (image 88, series 6).  There are bilateral hypo attenuating adrenal nodule/masses with dominant right-sided mass measuring 3.5 x 2.7 cm (image 61, series 3 and dominant left-sided adrenal nodule measuring 2.1 x 1.4 cm (61, series 3). Both of these nodules are hypo attenuating (right-sided adrenal mass measures -5 Hounsfield units and left-sided adrenal nodule measures 0 Hounsfield units) and as such, both are characterized  as benign adrenal adenomas.  Note is made of an approximately 2.6 cm hypo attenuating lesion arising from the superior pole the left kidney which is incompletely imaged though likely represents a renal cyst (image 104, series 6). There is an additional approximately 1 cm hypo attenuating lesion arising from the superior pole the right kidney which is also incompletely imaged and characterized on this examination though also favored to represent a renal cyst.  No acute or aggressive osseous abnormalities. 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  Regional soft tissues appear normal. Normal appearance of the thyroid gland.  IMPRESSION: 1. 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 No evidence of thoracic aortic dissection or periaortic stranding. Recommend semi-annual imaging followup by CTA or MRA and referral to cardiothoracic surgery if not already obtained. This recommendation follows 2010 ACCF/AHA/AATS/ACR/ASA/SCA/SCAI/SIR/STS/SVM Guidelines for the Diagnosis and Management of Patients With Thoracic Aortic Disease. Circulation. 2010; 121: W237-S283 2. Borderline cardiomegaly. 3. Coronary artery calcifications. 4. Incidentally noted hepatic hemangioma and bilateral benign adrenal adenomas.   Electronically Signed   By: Sandi Mariscal M.D.   On: 04/24/2015 16:28         Subjective: Patient denies fevers, chills, headache, chest pain, dyspnea, nausea, vomiting, diarrhea, abdominal pain, dysuria, hematuria. patient still has some numbness and tingling in his hands and feet. Denies any headache, visual disturbance, fevers, chills, chest pain, shortness breath, nausea, vomiting, diarrhea, abdominal pain.    Objective: Filed Vitals:   04/25/15 2341 04/26/15 0634 04/26/15 1149 04/26/15 1520  BP: 154/93 130/97 144/90 149/88  Pulse: 57  61 57  Temp: 98.1  F (36.7 C)  98.2 F (36.8 C) 97.8 F (36.6 C)   TempSrc: Oral Oral Oral   Resp: 18 18 18    Height:      Weight:  81.647 kg (180 lb)    SpO2: 98% 98% 100%     Intake/Output Summary (Last 24 hours) at 04/26/15 1544 Last data filed at 04/26/15 1522  Gross per 24 hour  Intake    460 ml  Output   1675 ml  Net  -1215 ml   Weight change: -0.907 kg (-2 lb) Exam:   General:  Pt is alert, follows commands appropriately, not in acute distress  HEENT: No icterus, No thrush, No neck mass, [place]/AT  Cardiovascular: RRR, S1/S2, no rubs, no gallops  Respiratory: CTA bilaterally, no wheezing, no crackles, no rhonchi  Abdomen: Soft/+BS, non tender, non distended, no guarding  Extremities: No edema, No lymphangitis, No petechiae, No rashes, no synovitis  Data Reviewed: Basic Metabolic Panel:  Recent Labs Lab 04/23/15 0753 04/23/15 0805 04/23/15 2208 04/24/15 0310 04/26/15 0402  NA 140 142  --  141 140  K 3.7 3.7  --  3.6 4.1  CL 105 102  --  108 108  CO2 29  --   --  27 26  GLUCOSE 87 83  --  90 95  BUN 13 12  --  20 16  CREATININE 1.01 1.00 1.08 0.96 1.00  CALCIUM 9.1  --   --  8.8* 8.7*   Liver Function Tests:  Recent Labs Lab 04/23/15 0753 04/24/15 0310  AST 14* 14*  ALT 12* 10*  ALKPHOS 66 71  BILITOT 0.8 0.6  PROT 7.2 6.5  ALBUMIN 4.2 3.8    Recent Labs Lab 04/23/15 0753  LIPASE 19*   No results for input(s): AMMONIA in the last 168 hours. CBC:  Recent Labs Lab 04/23/15 0753 04/23/15 0805 04/23/15 2208 04/24/15 0310 04/26/15 0402  WBC 2.1*  --  3.3* 4.1 2.7*  HGB 14.9 15.6 14.9 14.1 13.7  HCT 44.5 46.0 45.0 41.5 40.8  MCV 91.4  --  90.9 89.8 91.1  PLT 161  --  155 155 149*   Cardiac Enzymes:  Recent Labs Lab 04/23/15 2208 04/24/15 0310 04/24/15 0918  TROPONINI <0.03 <0.03 <0.03   BNP: Invalid input(s): POCBNP CBG:  Recent Labs Lab 04/25/15 1627 04/25/15 2106 04/25/15 2338 04/26/15 0630 04/26/15 1145  GLUCAP 104* 80 116* 78 112*    No  results found for this or any previous visit (from the past 240 hour(s)).   Scheduled Meds: . aspirin  300 mg Rectal Daily   Or  . aspirin  325 mg Oral Daily  . cyanocobalamin  1,000 mcg Intramuscular Q24H  . docusate sodium  100 mg Oral BID  . enoxaparin (LOVENOX) injection  40 mg Subcutaneous Q24H  . feeding supplement (ENSURE ENLIVE)  237 mL Oral BID BM  . hydrALAZINE  25 mg Oral 3 times per day   Continuous Infusions: . sodium chloride 0.9 % 1,000 mL with potassium chloride 20 mEq infusion 75 mL/hr at 04/26/15 1031     TAT, DAVID, DO  [facility] Pager [phone]  If 7PM-7AM, please contact night-coverage [email or web address] Password [email or web address] 04/26/2015, 3:44 PM   LOS: 2 days

## 2015-04-26 NOTE — Progress Notes (Signed)
SUBJECTIVE:  No complaints  OBJECTIVE:   Vitals:   Filed Vitals:   04/25/15 1531 04/25/15 2021 04/25/15 2341 04/26/15 0634  BP: 135/85 149/93 154/93 130/97  Pulse:  59 57   Temp:  98.3 F (36.8 C) 98.1 F (36.7 C) 98.2 F (36.8 C)  TempSrc:  Oral Oral Oral  Resp:  18 18 18   Height:      Weight:    180 lb (81.647 kg)  SpO2:  100% 98% 98%   I&O's:   Intake/Output Summary (Last 24 hours) at 04/26/15 1104 Last data filed at 04/26/15 0834  Gross per 24 hour  Intake    460 ml  Output   1325 ml  Net   -865 ml   TELEMETRY: Reviewed telemetry pt in NSR with periods of second degree type 1 Wenkebach block:     PHYSICAL EXAM General: Well developed, well nourished, in no acute distress Head: Eyes PERRLA, No xanthomas.   Normal cephalic and atramatic  Lungs:   Clear bilaterally to auscultation and percussion. Heart:   HRRR S1 S2 Pulses are 2+ & equal. Abdomen: Bowel sounds are positive, abdomen soft and non-tender without masses  Extremities:   No clubbing, cyanosis or edema.  DP +1 Neuro: Alert and oriented X 3. Psych:  Good affect, responds appropriately   LABS: Basic Metabolic Panel:  Recent Labs  04/24/15 0310 04/26/15 0402  NA 141 140  K 3.6 4.1  CL 108 108  CO2 27 26  GLUCOSE 90 95  BUN 20 16  CREATININE 0.96 1.00  CALCIUM 8.8* 8.7*   Liver Function Tests:  Recent Labs  04/24/15 0310  AST 14*  ALT 10*  ALKPHOS 71  BILITOT 0.6  PROT 6.5  ALBUMIN 3.8   No results for input(s): LIPASE, AMYLASE in the last 72 hours. CBC:  Recent Labs  04/24/15 0310 04/26/15 0402  WBC 4.1 2.7*  HGB 14.1 13.7  HCT 41.5 40.8  MCV 89.8 91.1  PLT 155 149*   Cardiac Enzymes:  Recent Labs  04/23/15 2208 04/24/15 0310 04/24/15 0918  TROPONINI <0.03 <0.03 <0.03   BNP: Invalid input(s): POCBNP D-Dimer: No results for input(s): DDIMER in the last 72 hours. Hemoglobin A1C:  Recent Labs  04/24/15 0310  HGBA1C 6.0*   Fasting Lipid Panel:  Recent  Labs  04/24/15 0310  CHOL 180  HDL 41  LDLCALC 124*  TRIG 74  CHOLHDL 4.4   Thyroid Function Tests:  Recent Labs  04/23/15 2208  TSH 2.649   Anemia Panel:  Recent Labs  04/23/15 2208  VITAMINB12 169*  FOLATE 13.1   Coag Panel:   Lab Results  Component Value Date   INR 1.0 07/05/2007    RADIOLOGY: Dg Chest 1 View  04/24/2015   CLINICAL DATA:  54 year old male with dizziness  EXAM: CHEST  1 VIEW  COMPARISON:  Earlier Radiograph dated 04/23/2015  FINDINGS: There has been interval placement of needle markers in the region of the previously seen nodules. There is no focal consolidation, pleural effusion, or pneumothorax. The cardiac silhouette is within normal limits. The osseous structures appear unremarkable.  IMPRESSION: No active disease.  The previously described pulmonary nodule corresponds to the nipple marker.   Electronically Signed   By: Anner Crete M.D.   On: 04/24/2015 01:24   Dg Chest 2 View  04/23/2015   CLINICAL DATA:  Chest discomfort with some difficulty breathing for some time now, came in today for numbness, tingling in fingers, poor historian  EXAM: CHEST  2 VIEW  COMPARISON:  01/01/2015  FINDINGS: Stable mild hyperinflation. Heart size and vascular pattern are normal. No infiltrate or consolidation. No pleural effusions. 8 mm nodular opacity over the left lower lobe.  IMPRESSION: No acute findings. Possible pulmonary nodule versus nipple shadow on the left. Recommend repeating the PA view with nipple markers.   Electronically Signed   By: Skipper Cliche M.D.   On: 04/23/2015 09:16   Ct Head Wo Contrast  04/23/2015   CLINICAL DATA:  Twenty-four hours post tPA dose.  EXAM: CT HEAD WITHOUT CONTRAST  TECHNIQUE: Contiguous axial images were obtained from the base of the skull through the vertex without intravenous contrast.  COMPARISON:  None.  FINDINGS: No intracranial hemorrhage, mass effect, or midline shift. No cerebral edema. There is mal periventricular and  deep white matter hypodensity, most significant in the left parietal lobe, without convincing evidence of territorial infarct. No hydrocephalus. The basilar cisterns are patent. No intracranial fluid collection. Calvarium is intact. Included paranasal sinuses and mastoid air cells are well aerated.  IMPRESSION: 1. No intracranial hemorrhage. 2. Periventricular and deep white matter hypodensity, fairly symmetric, however most prominent involving left parietal lobe. Findings are likely related to chronic small vessel ischemia, there is no convincing evidence of territorial infarct. No prior exams are available for comparison.   Electronically Signed   By: Jeb Levering M.D.   On: 04/23/2015 22:08   Mr Brain Wo Contrast  04/25/2015   CLINICAL DATA:  Acute presentation with visual disturbance treated with tPA. Followup. Presentation on 04/23/2015.  EXAM: MRI HEAD WITHOUT CONTRAST  TECHNIQUE: Multiplanar, multiecho pulse sequences of the brain and surrounding structures were obtained without intravenous contrast.  COMPARISON:  04/23/2015.  FINDINGS: Diffusion imaging does not show any acute or subacute infarction. There chronic small-vessel ischemic changes of the pons. No focal cerebellar infarction. Cerebral hemispheres show moderate changes of chronic small vessel disease throughout the deep and subcortical white matter, markedly advanced for age. No cortical or large vessel territory infarction. No mass lesion, hemorrhage, hydrocephalus or extra-axial collection. No pituitary mass. No inflammatory sinus disease. No skull or skullbase lesion.  IMPRESSION: No acute or subacute insult. Chronic small-vessel ischemic changes affecting the brainstem in the cerebral hemispheric white matter, markedly advanced for age.   Electronically Signed   By: Nelson Chimes M.D.   On: 04/25/2015 13:28   Ct Angio Chest Aorta W/cm &/or Wo/cm  04/24/2015   CLINICAL DATA:  History of hypertension, evaluate for thoracic aortic  aneurysm.  EXAM: CT ANGIOGRAPHY CHEST WITH CONTRAST  TECHNIQUE: Multidetector CT imaging of the chest was performed using the standard protocol during bolus administration of intravenous contrast. Multiplanar CT image reconstructions and MIPs were obtained to evaluate the vascular anatomy.  CONTRAST:  144mL OMNIPAQUE IOHEXOL 350 MG/ML SOLN  COMPARISON:  Chest radiograph - 04/23/2015  FINDINGS: Vascular Findings:  There is mild aneurysmal dilatation of the aortic root and ascending thoracic aorta with measurements as follows.  Review of the precontrast images are negative for the presence of an intramural hematoma. No evidence of thoracic aortic dissection on this nongated examination. Conventional configuration of the aortic arch. The branch vessels of the aortic arch appear widely patent throughout their imaged course.  Although this examination was not tailored for the evaluation of the pulmonary arteries, there are no discrete filling defects within the central pulmonary arterial tree to suggest central pulmonary embolism. Normal caliber of the main pulmonary artery.  Borderline cardiomegaly. Minimal coronary artery  calcifications. No pericardial effusion.  -------------------------------------------------------------  Thoracic aortic measurements:  Aortic root:  48 mm in greatest oblique coronal diameter  Sinotubular junction  46 mm as measured in greatest oblique coronal dimension.  Proximal ascending aorta  46 mm as measured in greatest oblique axial dimension at the level of the main pulmonary artery an approximately 45 mm in greatest oblique coronal diameter (coronal image 49, series 602).  Aortic arch aorta  31 mm as measured in greatest oblique sagittal dimension.  Proximal descending thoracic aorta  31 mm as measured in greatest oblique axial dimension at the level of the main pulmonary artery.  Distal descending thoracic aorta  28 mm as measured in greatest oblique axial dimension at the level of the  diaphragmatic hiatus.  Review of the MIP images confirms the above findings.  -------------------------------------------------------------  Non-Vascular Findings:  Minimal dependent subpleural ground-glass atelectasis within the dependent portions of the bilateral lower lobes, right greater than left. No focal airspace opacities. No pleural effusion or pneumothorax. The central pulmonary airways appear widely patent.  No discrete pulmonary nodules.  There is a minimal amount of ill-defined soft tissue within the anterior mediastinum favored to represent residual thymic tissue. No mediastinal, hilar axillary lymphadenopathy.  Limited early arterial phase evaluation of the upper abdomen demonstrates an approximately 4.1 x 2.8 cm lesion within the subcapsular aspect of the dome of the lateral segment of the left lobe of the liver which demonstrates peripheral nodular enhancement compatible with a hemangioma (image 88, series 6).  There are bilateral hypo attenuating adrenal nodule/masses with dominant right-sided mass measuring 3.5 x 2.7 cm (image 61, series 3 and dominant left-sided adrenal nodule measuring 2.1 x 1.4 cm (61, series 3). Both of these nodules are hypo attenuating (right-sided adrenal mass measures -5 Hounsfield units and left-sided adrenal nodule measures 0 Hounsfield units) and as such, both are characterized as benign adrenal adenomas.  Note is made of an approximately 2.6 cm hypo attenuating lesion arising from the superior pole the left kidney which is incompletely imaged though likely represents a renal cyst (image 104, series 6). There is an additional approximately 1 cm hypo attenuating lesion arising from the superior pole the right kidney which is also incompletely imaged and characterized on this examination though also favored to represent a renal cyst.  No acute or aggressive osseous abnormalities. 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  Regional soft  tissues appear normal. Normal appearance of the thyroid gland.  IMPRESSION: 1. 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 No evidence of thoracic aortic dissection or periaortic stranding. Recommend semi-annual imaging followup by CTA or MRA and referral to cardiothoracic surgery if not already obtained. This recommendation follows 2010 ACCF/AHA/AATS/ACR/ASA/SCA/SCAI/SIR/STS/SVM Guidelines for the Diagnosis and Management of Patients With Thoracic Aortic Disease. Circulation. 2010; 121: A630-Z601 2. Borderline cardiomegaly. 3. Coronary artery calcifications. 4. Incidentally noted hepatic hemangioma and bilateral benign adrenal adenomas.   Electronically Signed   By: Sandi Mariscal M.D.   On: 04/24/2015 16:28   ASSESSMENT AND PLAN: # Mobitz I: Seemingly asymptomatic. Need to correlate dizziness with low HR. Some tele strips are clearly Mobitz I second degree AV block with progressive PR prolongation and then a dropped QRS but difficult to say if the 2:1 block episodes are type I or Type II. I did find a few episodes where there was no PR prolongation associated with dropped  QRS complexes which would be more consistent with Type II. His HR drops into the mid 30's and he has been symptomatic with dizziness and some TIA like symptoms. He is a very poor historian and difficult to keep on task at hand and apparently did not complain at all when Dr. Oval Linsey was seeing him and had episodes of heart block at that time but says that he has been having episodes of lightheadedness and feeling like he is going to pass out. Will ask EP to see.  Most likely will need outpt event monitor to try to correlate symptoms with arrhythmias but not sure how compliant patient will be given that he is homeless.   - Avoid nodal agents - Continue telemetry for now. - keep K>4, Mg >2   # Hypertensive urgency: This may be the  reason for his facial fullness/HA.  BP improved but still elevated.  Need aggressive control of HTN with aortic aneurysm. - Increase Hydralazine to  25mg  TID - avoid BB/CCB due to intermittent heart block  # Hyperlipidema: Resume statin per IM  # Neurologic complaints: This may be due to low B12 but also need to consider TIA especially given his aortic vascular disease .MRI of brain with small vessel ischemic changes.  # Ascending aortic anerusym - significant discrepancy between the CT scan (4.6cm) and echo (6.8cm ascending aortic aneurysm). Will review echo findings. He needs aggressive BP control. Will set up to see CVTS as outpt.   Principal Problem:  Dizziness Active Problems:  Heart block AV second degree  Diabetes mellitus type 2, controlled  Essential hypertension  Tobacco abuse  AV block, 2nd degree     TURNER,TRACI R, MD  04/26/2015  11:04 AM

## 2015-04-26 NOTE — Consult Note (Signed)
ELECTROPHYSIOLOGY CONSULT NOTE  Patient ID: Javier Rivera, MRN: [id number], DOB/AGE: 12-14-1960 54 y.o. Admit date: 04/23/2015 Date of Consult: 04/26/2015  Primary Physician: Mack Hook, MD Primary Cardiologist: TF Chief Complaint:  bradycardia   HPI Javier Rivera is a 54 y.o. male  Seen because of MBZ1 and SBrad that was noted incidentally Does have some DOE but denies lighthheadedness and while being observed in ER by Dr TR no symptoms with pauses  Echo EF  Normal  Arotic dilitation noted >>68 mm BUT CT only 46  Multiple non cardiac somatic complaints--  No family hx of pectoral deformities    Tall family meembers    Never heard the term MARFAN  Past Medical History  Diagnosis Date  . Diabetes mellitus without complication   . Hypertension   . Renal disorder     pt stated that masses were found on his kidneys      Surgical History:  Past Surgical History  Procedure Laterality Date  . Knee surgery      bilateral     Home Meds: Prior to Admission medications   Medication Sig Start Date End Date Taking? Authorizing Sherlonda Flater  amLODipine (NORVASC) 10 MG tablet Take 10 mg by mouth daily.    Historical Eivan Gallina, MD  aspirin EC 81 MG tablet Take 81 mg by mouth daily.    Historical Jeniah Kishi, MD  benazepril (LOTENSIN) 40 MG tablet Take 40 mg by mouth daily.    Historical Camri Molloy, MD  erythromycin ophthalmic ointment Place a 1/2 inch ribbon of ointment into the lower eyelid. Patient not taking: Reported on 02/10/2015 06/28/12   Delos Haring, PA-C  glipiZIDE (GLUCOTROL XL) 5 MG 24 hr tablet Take 5 mg by mouth daily.    Historical Brittie Whisnant, MD  hydrochlorothiazide (HYDRODIURIL) 25 MG tablet Take 25 mg by mouth daily.    Historical Kevonna Nolte, MD  metroNIDAZOLE (FLAGYL) 500 MG tablet Take 1 tablet (500 mg total) by mouth 2 (two) times daily. Patient not taking: Reported on 02/10/2015 01/01/15   Ottie Glazier, PA-C  simvastatin (ZOCOR) 10 MG tablet Take 10 mg by  mouth daily.    Historical Burak Zerbe, MD    Inpatient Medications:  . aspirin  300 mg Rectal Daily   Or  . aspirin  325 mg Oral Daily  . cyanocobalamin  1,000 mcg Intramuscular Q24H  . docusate sodium  100 mg Oral BID  . enoxaparin (LOVENOX) injection  40 mg Subcutaneous Q24H  . feeding supplement (ENSURE ENLIVE)  237 mL Oral BID BM  . hydrALAZINE  25 mg Oral 3 times per day     Allergies:  Allergies  Allergen Reactions  . Lisinopril Palpitations    "made me real sick, made my heart rate scatter"    Social History   Social History  . Marital Status: Single    Spouse Name: N/A  . Number of Children: N/A  . Years of Education: N/A   Occupational History  . Not on file.   Social History Main Topics  . Smoking status: Current Every Day Smoker -- 0.25 packs/day for 20 years    Types: Cigarettes  . Smokeless tobacco: Never Used  . Alcohol Use: No  . 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 Sexual Activity: Not on file   Other Topics Concern  . Not on file   Social History Narrative     Family History  Problem Relation Age of Onset  . Diabetes  Mellitus II Mother   . Hypertension Mother   . Diabetes Mellitus II Sister   . Hypertension Sister      ROS:  Please see the history of present illness.     All other systems reviewed and negative.    Physical Exam:   Blood pressure 130/97, pulse 57, temperature 98.2 F (36.8 C), temperature source Oral, resp. rate 18, height 6\' 9"  (2.057 m), weight 180 lb (81.647 kg), SpO2 98 %. General: Well developed, well nourished tall and thin male in no acute distress. Head: Normocephalic, atraumatic, sclera non-icteric, no xanthomas, nares are without discharge. EENT: normal Lymph Nodes:  none Back: without scoliosis/kyphosis  no CVA tendersness Neck: Negative for carotid bruits. JVD not elevated. Lungs: Clear bilaterally to auscultation without wheezes, rales, or rhonchi. Breathing is unlabored. Heart:  RRR with S1 S2. No*  murmur , rubs, or gallops appreciated. Abdomen: Soft, non-tender, non-distended with normoactive bowel sounds. No hepatomegaly. No rebound/guarding. No obvious abdominal masses. Msk:  Strength and tone appear normal for age. Extremities: No clubbing or cyanosis. N edema.  Distal pedal pulses are 2+ and equal bilaterally without arachnodactyly thumb sign or wirst -sign Normal breast bone Skin: Warm and Dry Neuro: Alert and oriented X 3. CN III-XII intact Grossly normal sensory and motor function . Psych:  Responds to questions appropriately with a normal affect.      Labs: Cardiac Enzymes  Recent Labs  04/23/15 2208 04/24/15 0310 04/24/15 0918  TROPONINI <0.03 <0.03 <0.03   CBC Lab Results  Component Value Date   WBC 2.7* 04/26/2015   HGB 13.7 04/26/2015   HCT 40.8 04/26/2015   MCV 91.1 04/26/2015   PLT 149* 04/26/2015   PROTIME: No results for input(s): LABPROT, INR in the last 72 hours. Chemistry  Recent Labs Lab 04/24/15 0310 04/26/15 0402  NA 141 140  K 3.6 4.1  CL 108 108  CO2 27 26  BUN 20 16  CREATININE 0.96 1.00  CALCIUM 8.8* 8.7*  PROT 6.5  --   BILITOT 0.6  --   ALKPHOS 71  --   ALT 10*  --   AST 14*  --   GLUCOSE 90 95   Lipids Lab Results  Component Value Date   CHOL 180 04/24/2015   HDL 41 04/24/2015   LDLCALC 124* 04/24/2015   TRIG 74 04/24/2015   BNP No results found for: PROBNP Thyroid Function Tests:  Recent Labs  04/23/15 2208  TSH 2.649      Miscellaneous No results found for: DDIMER  Radiology/Studies:  Dg Chest 1 View  04/24/2015   CLINICAL DATA:  54 year old male with dizziness  EXAM: CHEST  1 VIEW  COMPARISON:  Earlier Radiograph dated 04/23/2015  FINDINGS: There has been interval placement of needle markers in the region of the previously seen nodules. There is no focal consolidation, pleural effusion, or pneumothorax. The cardiac silhouette is within normal limits. The osseous structures appear  unremarkable.  IMPRESSION: No active disease.  The previously described pulmonary nodule corresponds to the nipple marker.   Electronically Signed   By: Anner Crete M.D.   On: 04/24/2015 01:24   Dg Chest 2 View  04/23/2015   CLINICAL DATA:  Chest discomfort with some difficulty breathing for some time now, came in today for numbness, tingling in fingers, poor historian  EXAM: CHEST  2 VIEW  COMPARISON:  01/01/2015  FINDINGS: Stable mild hyperinflation. Heart size and vascular pattern are normal. No infiltrate or consolidation. No pleural effusions.  8 mm nodular opacity over the left lower lobe.  IMPRESSION: No acute findings. Possible pulmonary nodule versus nipple shadow on the left. Recommend repeating the PA view with nipple markers.   Electronically Signed   By: Skipper Cliche M.D.   On: 04/23/2015 09:16   Ct Head Wo Contrast  04/23/2015   CLINICAL DATA:  Twenty-four hours post tPA dose.  EXAM: CT HEAD WITHOUT CONTRAST  TECHNIQUE: Contiguous axial images were obtained from the base of the skull through the vertex without intravenous contrast.  COMPARISON:  None.  FINDINGS: No intracranial hemorrhage, mass effect, or midline shift. No cerebral edema. There is mal periventricular and deep white matter hypodensity, most significant in the left parietal lobe, without convincing evidence of territorial infarct. No hydrocephalus. The basilar cisterns are patent. No intracranial fluid collection. Calvarium is intact. Included paranasal sinuses and mastoid air cells are well aerated.  IMPRESSION: 1. No intracranial hemorrhage. 2. Periventricular and deep white matter hypodensity, fairly symmetric, however most prominent involving left parietal lobe. Findings are likely related to chronic small vessel ischemia, there is no convincing evidence of territorial infarct. No prior exams are available for comparison.   Electronically Signed   By: Jeb Levering M.D.   On: 04/23/2015 22:08   Mr Brain Wo  Contrast  04/25/2015   CLINICAL DATA:  Acute presentation with visual disturbance treated with tPA. Followup. Presentation on 04/23/2015.  EXAM: MRI HEAD WITHOUT CONTRAST  TECHNIQUE: Multiplanar, multiecho pulse sequences of the brain and surrounding structures were obtained without intravenous contrast.  COMPARISON:  04/23/2015.  FINDINGS: Diffusion imaging does not show any acute or subacute infarction. There chronic small-vessel ischemic changes of the pons. No focal cerebellar infarction. Cerebral hemispheres show moderate changes of chronic small vessel disease throughout the deep and subcortical white matter, markedly advanced for age. No cortical or large vessel territory infarction. No mass lesion, hemorrhage, hydrocephalus or extra-axial collection. No pituitary mass. No inflammatory sinus disease. No skull or skullbase lesion.  IMPRESSION: No acute or subacute insult. Chronic small-vessel ischemic changes affecting the brainstem in the cerebral hemispheric white matter, markedly advanced for age.   Electronically Signed   By: Nelson Chimes M.D.   On: 04/25/2015 13:28   Ct Angio Chest Aorta W/cm &/or Wo/cm  04/24/2015   CLINICAL DATA:  History of hypertension, evaluate for thoracic aortic aneurysm.  EXAM: CT ANGIOGRAPHY CHEST WITH CONTRAST  TECHNIQUE: Multidetector CT imaging of the chest was performed using the standard protocol during bolus administration of intravenous contrast. Multiplanar CT image reconstructions and MIPs were obtained to evaluate the vascular anatomy.  CONTRAST:  160mL OMNIPAQUE IOHEXOL 350 MG/ML SOLN  COMPARISON:  Chest radiograph - 04/23/2015  FINDINGS: Vascular Findings:  There is mild aneurysmal dilatation of the aortic root and ascending thoracic aorta with measurements as follows.  Review of the precontrast images are negative for the presence of an intramural hematoma. No evidence of thoracic aortic dissection on this nongated examination. Conventional configuration of the  aortic arch. The branch vessels of the aortic arch appear widely patent throughout their imaged course.  Although this examination was not tailored for the evaluation of the pulmonary arteries, there are no discrete filling defects within the central pulmonary arterial tree to suggest central pulmonary embolism. Normal caliber of the main pulmonary artery.  Borderline cardiomegaly. Minimal coronary artery calcifications. No pericardial effusion.  -------------------------------------------------------------  Thoracic aortic measurements:  Aortic root:  48 mm in greatest oblique coronal diameter  Sinotubular junction  46 mm as  measured in greatest oblique coronal dimension.  Proximal ascending aorta  46 mm as measured in greatest oblique axial dimension at the level of the main pulmonary artery an approximately 45 mm in greatest oblique coronal diameter (coronal image 49, series 602).  Aortic arch aorta  31 mm as measured in greatest oblique sagittal dimension.  Proximal descending thoracic aorta  31 mm as measured in greatest oblique axial dimension at the level of the main pulmonary artery.  Distal descending thoracic aorta  28 mm as measured in greatest oblique axial dimension at the level of the diaphragmatic hiatus.  Review of the MIP images confirms the above findings.  -------------------------------------------------------------  Non-Vascular Findings:  Minimal dependent subpleural ground-glass atelectasis within the dependent portions of the bilateral lower lobes, right greater than left. No focal airspace opacities. No pleural effusion or pneumothorax. The central pulmonary airways appear widely patent.  No discrete pulmonary nodules.  There is a minimal amount of ill-defined soft tissue within the anterior mediastinum favored to represent residual thymic tissue. No mediastinal, hilar axillary lymphadenopathy.  Limited early arterial phase evaluation of the upper abdomen demonstrates an approximately 4.1 x  2.8 cm lesion within the subcapsular aspect of the dome of the lateral segment of the left lobe of the liver which demonstrates peripheral nodular enhancement compatible with a hemangioma (image 88, series 6).  There are bilateral hypo attenuating adrenal nodule/masses with dominant right-sided mass measuring 3.5 x 2.7 cm (image 61, series 3 and dominant left-sided adrenal nodule measuring 2.1 x 1.4 cm (61, series 3). Both of these nodules are hypo attenuating (right-sided adrenal mass measures -5 Hounsfield units and left-sided adrenal nodule measures 0 Hounsfield units) and as such, both are characterized as benign adrenal adenomas.  Note is made of an approximately 2.6 cm hypo attenuating lesion arising from the superior pole the left kidney which is incompletely imaged though likely represents a renal cyst (image 104, series 6). There is an additional approximately 1 cm hypo attenuating lesion arising from the superior pole the right kidney which is also incompletely imaged and characterized on this examination though also favored to represent a renal cyst.  No acute or aggressive osseous abnormalities. 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  Regional soft tissues appear normal. Normal appearance of the thyroid gland.  IMPRESSION: 1. 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 No evidence of thoracic aortic dissection or periaortic stranding. Recommend semi-annual imaging followup by CTA or MRA and referral to cardiothoracic surgery if not already obtained. This recommendation follows 2010 ACCF/AHA/AATS/ACR/ASA/SCA/SCAI/SIR/STS/SVM Guidelines for the Diagnosis and Management of Patients With Thoracic Aortic Disease. Circulation. 2010; 121: W546-E703 2. Borderline cardiomegaly. 3. Coronary artery calcifications. 4. Incidentally noted hepatic hemangioma and bilateral benign  adrenal adenomas.   Electronically Signed   By: Sandi Mariscal M.D.   On: 04/24/2015 16:28    EKG: sinus brad With 2AVB 1  TEL Sinus mostly with some MBZ1 and some 2;1     Assessment and Plan:  Sinus Loletha Grayer  2AVB1  Ascending aortic dilitiation  Hypertension  Marfanoid habitus--tall aand thin  The pt has no clearly attributable symptoms to his brady or MBZ1 heart block   His ability to give a history has been weel outlined in previous notes and I share the frustration in trying to understand  Infrequently amlodipine via CA channel blockade can aggravate bradycardia so reasonable to avoid  and use alternative  antihypertensive therapy  He is tall and thin but has few stigmata for marfan--without musculoskeletal abnormalities   Cardiac abnormalities do NOT include MVP leaving the aortic root dilitation as a question related to possible Marfan-- He should have hsi Z score calculaed   Virl Axe

## 2015-04-26 NOTE — Consult Note (Signed)
[address]       [place] [address]             [phone]        Saqib Brundage Howe Medical Record #[id number] Date of Birth: [birth year]  Referring: Dr Ron Parker previously seen by Hochrein 2009 Primary Care: Mack Hook, MD  Chief Complaint:    Chief Complaint  Patient presents with   Aorta dilated to 6.8 cm on echo not confirmed on CT        History of Present Illness:     Patient  is a 54 y.o. male with history of diabetes mellitus, hypertension, ongoing tobacco abuse presents to the  [facility] ER Thursday 9/15 after patient was having dizziness with some visual symptoms. In addition patient also was having some numbness of the left hand lateral 2 fingers and numbness in his legs. I Patient's blood pressure was also found to be elevated. Patient states over the last one he has not been taking his medications due to financial issues.   Patient's blood pressure on arrival was found to be elevated. Patient in addition to the above symptoms has been having some pressure-like symptoms in head. In addition patient has multiple complaints including some chest pressure off and on with shortness of breath. n the ER patient was found to have second-degree AV block and cardiologist was consulted. He previously saw Dr Jenkins Rouge in 2009 echo cardiogram showed ascending aorta 4.4 cm. Friday I was called urgently because of echo finding of ascending aorta of 6.8 cm. Patient had never had CT of chest. This was done urgently with no evidence of dissection and ascending aorta at root of 4.7 cm.  Patient has no history of lens problems or family history  of dissection or early sudden death of unknown etiology.  And he also has lost at least 100 pounds in last 1 year as per the patient. Current Activity/ Functional Status: Patient is independent with mobility/ambulation, transfers, ADL's, IADL's.   Zubrod Score: At the time of surgery this patient's most appropriate activity  status/level should be described as: [x]     0    Normal activity, no symptoms []     1    Restricted in physical strenuous activity but ambulatory, able to do out light work []     2    Ambulatory and capable of self care, unable to do work activities, up and about                 more than 50%  Of the time                            []     3    Only limited self care, in bed greater than 50% of waking hours []     4    Completely disabled, no self care, confined to bed or chair []     5    Moribund  Past Medical History  Diagnosis Date  . Diabetes mellitus without complication   . Hypertension   . Renal disorder     pt stated that masses were found on his kidneys    Past Surgical History  Procedure Laterality Date  . Knee surgery      bilateral    History  Smoking status  . Current Every Day Smoker -- 0.25 packs/day for 20 years  . Types: Cigarettes  Smokeless tobacco  .  Never Used    History  Alcohol Use No    Social History   Social History  . Marital Status: Single    Spouse Name: N/A  . Number of Children: N/A  . Years of Education: N/A   Occupational History  . Not on file.   Social History Main Topics  . Smoking status: Current Every Day Smoker -- 0.25 packs/day for 20 years    Types: Cigarettes  . Smokeless tobacco: Never Used  . Alcohol Use: No  . 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 Sexual Activity: Not on file   Other Topics Concern  . Not on file   Social History Narrative    Allergies  Allergen Reactions  . Lisinopril Palpitations    "made me real sick, made my heart rate scatter"    Current Facility-Administered Medications  Medication Dose Route Frequency Provider Last Rate Last Dose  . aspirin suppository 300 mg  300 mg Rectal Daily Rise Patience, MD       Or  . aspirin tablet 325 mg  325 mg Oral Daily Rise Patience, MD   325 mg at 04/26/15 1036  . cyanocobalamin ((VITAMIN B-12)) injection 1,000 mcg   1,000 mcg Intramuscular Q24H Orson Eva, MD   1,000 mcg at 04/25/15 1509  . docusate sodium (COLACE) capsule 100 mg  100 mg Oral BID Orson Eva, MD   100 mg at 04/26/15 1037  . enoxaparin (LOVENOX) injection 40 mg  40 mg Subcutaneous Q24H Rise Patience, MD   40 mg at 04/25/15 2256  . feeding supplement (ENSURE ENLIVE) (ENSURE ENLIVE) liquid 237 mL  237 mL Oral BID BM Maricela Bo Ostheim, RD   237 mL at 04/26/15 1039  . hydrALAZINE (APRESOLINE) injection 10 mg  10 mg Intravenous Q4H PRN Reyne Dumas, MD      . hydrALAZINE (APRESOLINE) tablet 25 mg  25 mg Oral 3 times per day Sueanne Margarita, MD      . senna-docusate (Senokot-S) tablet 1 tablet  1 tablet Oral QHS PRN Rise Patience, MD   1 tablet at 04/25/15 1719  . sodium chloride 0.9 % 1,000 mL with potassium chloride 20 mEq infusion   Intravenous Continuous Orson Eva, MD 75 mL/hr at 04/26/15 1031      Prescriptions prior to admission  Medication Sig Dispense Refill Last Dose  . amLODipine (NORVASC) 10 MG tablet Take 10 mg by mouth daily.   Not Taking at Unknown time  . aspirin EC 81 MG tablet Take 81 mg by mouth daily.   Not Taking at Unknown time  . benazepril (LOTENSIN) 40 MG tablet Take 40 mg by mouth daily.   Not Taking at Unknown time  . erythromycin ophthalmic ointment Place a 1/2 inch ribbon of ointment into the lower eyelid. (Patient not taking: Reported on 02/10/2015) 1 g 0 Not Taking at Unknown time  . glipiZIDE (GLUCOTROL XL) 5 MG 24 hr tablet Take 5 mg by mouth daily.   Not Taking at Unknown time  . hydrochlorothiazide (HYDRODIURIL) 25 MG tablet Take 25 mg by mouth daily.   Not Taking at Unknown time  . metroNIDAZOLE (FLAGYL) 500 MG tablet Take 1 tablet (500 mg total) by mouth 2 (two) times daily. (Patient not taking: Reported on 02/10/2015) 14 tablet 0 Not Taking at Unknown time  . simvastatin (ZOCOR) 10 MG tablet Take 10 mg by mouth daily.   Not Taking at  Unknown time    Family History  Problem Relation Age of Onset  .  Diabetes Mellitus II Mother   . Hypertension Mother   . Diabetes Mellitus II Sister   . Hypertension Sister      Review of Systems:      Cardiac Review of Systems: Y or N  Chest Pain [  n  ]  Resting SOB [ n ] Exertional SOB  Blue.Reese  ]  Orthopnea [n]   Pedal Edema Florencio.Farrier   ]    Palpitations [ n ] Syncope  [ n ]   Presyncope [  y ]  General Review of Systems: [Y] = yes [  ]=no Constitional: recent weight change Blue.Reese ]; anorexia [  ]; fatigue [  ]; nausea [  ]; night sweats [  ]; fever [ n; or chills [ n ]                                                               Dental: poor dentition[  ]; Last Dentist visit:   Eye : blurred vision [  ]; diplopia [   ]; vision changes [ yes glases as child not now ];  Amaurosis fugax[  ]; Resp: cough [  ];  wheezing[  ];  hemoptysis[  ]; shortness of breath[  ]; paroxysmal nocturnal dyspnea[  ]; dyspnea on exertion[  ]; or orthopnea[  ];  GI:  gallstones[  ], vomiting[  ];  dysphagia[  ]; melena[  ];  hematochezia [  ]; heartburn[  ];   Hx of  Colonoscopy[  ]; GU: kidney stones [  ]; hematuria[  ];   dysuria [  ];  nocturia[  ];  history of     obstruction [  ]; urinary frequency [  ]             Skin: rash, swelling[  ];, hair loss[  ];  peripheral edema[  ];  or itching[  ]; Musculosketetal: myalgias[  ];  joint swelling[ n ];  joint erythema[  ];  joint pain[  ];  back pain[  ];  Heme/Lymph: bruising[  ];  bleeding[  ];  anemia[  ];  Neuro: TIA[ n ];  headaches[n  ];  stroke[  ];  vertigo[  ];  seizures[ n ];   paresthesias[  ];  difficulty walking[ n ];  Psych:depression[  ]; anxiety[  ];  Endocrine: diabetes[  ];  thyroid dysfunction[  ];  Immunizations: Flu [ n ]; Pneumococcal[ n ];  Other:no severe myopia, no history mr no history no PTX  Physical Exam: BP 144/90 mmHg  Pulse 61  Temp(Src) 97.8 F (36.6 C) (Oral)  Resp 18  Ht 6\' 9"  (2.057 m)  Wt 180 lb (81.647 kg)  BMI 19.30 kg/m2  SpO2 100%   General appearance: alert, cooperative, appears  stated age and tall thin. Head: Normocephalic, without obvious abnormality, atraumatic Neck: no adenopathy, no carotid bruit, no JVD, supple, symmetrical, trachea midline and thyroid not enlarged, symmetric, no tenderness/mass/nodules Lymph nodes: Cervical, supraclavicular, and axillary nodes normal. Resp: clear to auscultation bilaterally Back: symmetric, no curvature. ROM normal. No CVA tenderness. Cardio: regular rate and rhythm, S1, S2 normal, no murmur, click, rub or gallop GI: soft, non-tender; bowel sounds  normal; no masses,  no organomegaly Extremities: extremities normal, atraumatic, no cyanosis or edema and negative for wrist or thumb sign Neurologic: Grossly normal No sternal deformity Nl palate and uvula poor dentation  Diagnostic Studies & Laboratory data:     Recent Radiology Findings:   Mr Brain Wo Contrast  04/25/2015   CLINICAL DATA:  Acute presentation with visual disturbance treated with tPA. Followup. Presentation on 04/23/2015.  EXAM: MRI HEAD WITHOUT CONTRAST  TECHNIQUE: Multiplanar, multiecho pulse sequences of the brain and surrounding structures were obtained without intravenous contrast.  COMPARISON:  04/23/2015.  FINDINGS: Diffusion imaging does not show any acute or subacute infarction. There chronic small-vessel ischemic changes of the pons. No focal cerebellar infarction. Cerebral hemispheres show moderate changes of chronic small vessel disease throughout the deep and subcortical white matter, markedly advanced for age. No cortical or large vessel territory infarction. No mass lesion, hemorrhage, hydrocephalus or extra-axial collection. No pituitary mass. No inflammatory sinus disease. No skull or skullbase lesion.  IMPRESSION: No acute or subacute insult. Chronic small-vessel ischemic changes affecting the brainstem in the cerebral hemispheric white matter, markedly advanced for age.   Electronically Signed   By: Nelson Chimes M.D.   On: 04/25/2015 13:28   Ct Angio  Chest Aorta W/cm &/or Wo/cm  04/24/2015   CLINICAL DATA:  History of hypertension, evaluate for thoracic aortic aneurysm.  EXAM: CT ANGIOGRAPHY CHEST WITH CONTRAST  TECHNIQUE: Multidetector CT imaging of the chest was performed using the standard protocol during bolus administration of intravenous contrast. Multiplanar CT image reconstructions and MIPs were obtained to evaluate the vascular anatomy.  CONTRAST:  126mL OMNIPAQUE IOHEXOL 350 MG/ML SOLN  COMPARISON:  Chest radiograph - 04/23/2015  FINDINGS: Vascular Findings:  There is mild aneurysmal dilatation of the aortic root and ascending thoracic aorta with measurements as follows.  Review of the precontrast images are negative for the presence of an intramural hematoma. No evidence of thoracic aortic dissection on this nongated examination. Conventional configuration of the aortic arch. The branch vessels of the aortic arch appear widely patent throughout their imaged course.  Although this examination was not tailored for the evaluation of the pulmonary arteries, there are no discrete filling defects within the central pulmonary arterial tree to suggest central pulmonary embolism. Normal caliber of the main pulmonary artery.  Borderline cardiomegaly. Minimal coronary artery calcifications. No pericardial effusion.  -------------------------------------------------------------  Thoracic aortic measurements:  Aortic root:  48 mm in greatest oblique coronal diameter  Sinotubular junction  46 mm as measured in greatest oblique coronal dimension.  Proximal ascending aorta  46 mm as measured in greatest oblique axial dimension at the level of the main pulmonary artery an approximately 45 mm in greatest oblique coronal diameter (coronal image 49, series 602).  Aortic arch aorta  31 mm as measured in greatest oblique sagittal dimension.  Proximal descending thoracic aorta  31 mm as measured in greatest oblique axial dimension at the level of the main pulmonary artery.   Distal descending thoracic aorta  28 mm as measured in greatest oblique axial dimension at the level of the diaphragmatic hiatus.  Review of the MIP images confirms the above findings.  -------------------------------------------------------------  Non-Vascular Findings:  Minimal dependent subpleural ground-glass atelectasis within the dependent portions of the bilateral lower lobes, right greater than left. No focal airspace opacities. No pleural effusion or pneumothorax. The central pulmonary airways appear widely patent.  No discrete pulmonary nodules.  There is a minimal amount of ill-defined soft tissue within the anterior  mediastinum favored to represent residual thymic tissue. No mediastinal, hilar axillary lymphadenopathy.  Limited early arterial phase evaluation of the upper abdomen demonstrates an approximately 4.1 x 2.8 cm lesion within the subcapsular aspect of the dome of the lateral segment of the left lobe of the liver which demonstrates peripheral nodular enhancement compatible with a hemangioma (image 88, series 6).  There are bilateral hypo attenuating adrenal nodule/masses with dominant right-sided mass measuring 3.5 x 2.7 cm (image 61, series 3 and dominant left-sided adrenal nodule measuring 2.1 x 1.4 cm (61, series 3). Both of these nodules are hypo attenuating (right-sided adrenal mass measures -5 Hounsfield units and left-sided adrenal nodule measures 0 Hounsfield units) and as such, both are characterized as benign adrenal adenomas.  Note is made of an approximately 2.6 cm hypo attenuating lesion arising from the superior pole the left kidney which is incompletely imaged though likely represents a renal cyst (image 104, series 6). There is an additional approximately 1 cm hypo attenuating lesion arising from the superior pole the right kidney which is also incompletely imaged and characterized on this examination though also favored to represent a renal cyst.  No acute or aggressive osseous  abnormalities. 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  Regional soft tissues appear normal. Normal appearance of the thyroid gland.  IMPRESSION: 1. 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 No evidence of thoracic aortic dissection or periaortic stranding. Recommend semi-annual imaging followup by CTA or MRA and referral to cardiothoracic surgery if not already obtained. This recommendation follows 2010 ACCF/AHA/AATS/ACR/ASA/SCA/SCAI/SIR/STS/SVM Guidelines for the Diagnosis and Management of Patients With Thoracic Aortic Disease. Circulation. 2010; 121: V409-W119 2. Borderline cardiomegaly. 3. Coronary artery calcifications. 4. Incidentally noted hepatic hemangioma and bilateral benign adrenal adenomas.   Electronically Signed   By: Sandi Mariscal M.D.   On: 04/24/2015 16:28     I have independently reviewed the above radiologic studies.  ECHO done 2009 and 04/2015 reviewed      Recent Lab Findings: Lab Results  Component Value Date   WBC 2.7* 04/26/2015   HGB 13.7 04/26/2015   HCT 40.8 04/26/2015   PLT 149* 04/26/2015   GLUCOSE 95 04/26/2015   CHOL 180 04/24/2015   TRIG 74 04/24/2015   HDL 41 04/24/2015   LDLCALC 124* 04/24/2015   ALT 10* 04/24/2015   AST 14* 04/24/2015   NA 140 04/26/2015   K 4.1 04/26/2015   CL 108 04/26/2015   CREATININE 1.00 04/26/2015   BUN 16 04/26/2015   CO2 26 04/26/2015   TSH 2.649 04/23/2015   INR 1.0 07/05/2007   HGBA1C 6.0* 04/24/2015   Aortic Size Index=    4.6     /Body surface area is 2.16 meters squared. = 2.12  < 2.75 cm/m2      4% risk per year 2.75 to 4.25          8% risk per year > 4.25 cm/m2    20% risk per year  cross sectional area of aorta cm2/height in meters > 10 consider  Surgery  16.6/2.05= 8.3  Z score =  3.62 (aorta 4.6, ht 2.05.7, wt 81.6 age 89)  Assessment / Plan:    Tall thin male  with dilated aortic root  With elevated z score but without  lens Ectopia and systemic score for Marfan's less then 7 and no family history or genetic testing  done , no AI- has no indication for ascending aortic replacement now, does need .      1 good bp control  With close follow up, has skipped 2009 to 2016     2 dental consult     3 no cocaine use  Does not meet criteria for DX of Marfan's Coronary artery calcifications by ct Will need serial follow up ct of chest or other assessment of aortic size  Explained risks and dx with the patient in detail, but will need continued education   I  spent 40 minutes counseling the patient face to face and 50% or more the  time was spent in counseling and coordination of care. The total time spent in the appointment was 60 minutes.    Grace Isaac MD      [address] [place] [address] Office [phone]   Beeper [phone]  04/26/2015 12:17 PM

## 2015-04-27 DIAGNOSIS — I712 Thoracic aortic aneurysm, without rupture: Secondary | ICD-10-CM

## 2015-04-27 LAB — BASIC METABOLIC PANEL
Anion gap: 6 (ref 5–15)
BUN: 13 mg/dL (ref 6–20)
CHLORIDE: 105 mmol/L (ref 101–111)
CO2: 30 mmol/L (ref 22–32)
CREATININE: 0.99 mg/dL (ref 0.61–1.24)
Calcium: 9.1 mg/dL (ref 8.9–10.3)
GFR calc Af Amer: >60 mL/min (ref >60)
GFR calc non Af Amer: >60 mL/min (ref >60)
Glucose, Bld: 89 mg/dL (ref 65–99)
Potassium: 4.3 mmol/L (ref 3.5–5.1)
Sodium: 141 mmol/L (ref 135–145)

## 2015-04-27 LAB — LIPID PANEL
Cholesterol: 153 mg/dL (ref 0–200)
HDL: 48 mg/dL (ref >40)
LDL CALC: 95 mg/dL (ref 0–99)
Total CHOL/HDL Ratio: 3.2 RATIO
Triglycerides: 48 mg/dL (ref <150)
VLDL: 10 mg/dL (ref 0–40)

## 2015-04-27 LAB — FOLATE RBC
FOLATE, HEMOLYSATE: 274.5 ng/mL
Folate, RBC: 688 ng/mL (ref >498)
Hematocrit: 39.9 % (ref 37.5–51.0)

## 2015-04-27 LAB — CBC
HCT: 42.9 % (ref 39.0–52.0)
Hemoglobin: 14.2 g/dL (ref 13.0–17.0)
MCH: 30.3 pg (ref 26.0–34.0)
MCHC: 33.1 g/dL (ref 30.0–36.0)
MCV: 91.5 fL (ref 78.0–100.0)
Platelets: 150 10*3/uL (ref 150–400)
RBC: 4.69 MIL/uL (ref 4.22–5.81)
RDW: 12.9 % (ref 11.5–15.5)
WBC: 2.7 10*3/uL — AB (ref 4.0–10.5)

## 2015-04-27 LAB — GLUCOSE, CAPILLARY
GLUCOSE-CAPILLARY: 85 mg/dL (ref 65–99)
GLUCOSE-CAPILLARY: 109 mg/dL — AB (ref 65–99)

## 2015-04-27 MED ORDER — ENSURE ENLIVE PO LIQD: 237.0000 mL | Freq: Two times a day (BID) | ORAL | Status: DC

## 2015-04-27 MED ORDER — SITAGLIPTIN PHOSPHATE 50 MG PO TABS: 50.0000 mg | ORAL_TABLET | Freq: Every day | ORAL | Status: DC

## 2015-04-27 MED ORDER — CYANOCOBALAMIN 500 MCG PO TABS: 500.0000 ug | ORAL_TABLET | Freq: Every day | ORAL | Status: DC

## 2015-04-27 MED ORDER — HYDRALAZINE HCL 25 MG PO TABS: 25.0000 mg | ORAL_TABLET | Freq: Three times a day (TID) | ORAL | Status: DC

## 2015-04-27 MED ORDER — POTASSIUM CHLORIDE IN NACL 20-0.9 MEQ/L-% IV SOLN
INTRAVENOUS | Status: DC
  Administered 2015-04-27: 02:00:00 via INTRAVENOUS
  Filled 2015-04-27: qty 1000

## 2015-04-27 NOTE — Progress Notes (Signed)
Patient Name: Javier Rivera Date of Encounter: 04/27/2015  Principal Problem:   Dizziness Active Problems:   Heart block AV second degree   Diabetes mellitus type 2, controlled   Essential hypertension   Tobacco abuse   AV block, 2nd degree   Protein-calorie malnutrition, severe   Sensory disturbance   Ascending aorta dilatation     Primary Cardiologist: Dr. Oval Linsey Patient Profile: 54 yo male w/PMH of HTN, Type 2 DM, HLD, tobacco abuse and homelessness who presented to the hospital on 04/23/2015 for vision disturbances and dizziness found to have Mobitz I.   SUBJECTIVE: Feeling dizzy and lightheaded at times. Patient says it occurs "at different times" and unable to further expand upon if it occurs mostly at rest or up when walking. Denies any chest pain or palpitations. Does report having some constipation. Last bowel movement was 2 days ago.  OBJECTIVE Filed Vitals:   04/26/15 1149 04/26/15 1520 04/26/15 1959 04/27/15 0418  BP: 144/90 149/88 155/93 144/68  Pulse: 61 57 56 57  Temp: 97.8 F (36.6 C)  98.3 F (36.8 C) 97.7 F (36.5 C)  TempSrc: Oral  Oral Oral  Resp: 18  16 18   Height:      Weight:    180 lb 4.8 oz (81.784 kg)  SpO2: 100%  100% 100%    Intake/Output Summary (Last 24 hours) at 04/27/15 0849 Last data filed at 04/27/15 0759  Gross per 24 hour  Intake 14186.25 ml  Output   3675 ml  Net 10511.25 ml   Filed Weights   04/25/15 0510 04/26/15 0634 04/27/15 0418  Weight: 179 lb 9.6 oz (81.466 kg) 180 lb (81.647 kg) 180 lb 4.8 oz (81.784 kg)    PHYSICAL EXAM General: Well developed, well nourished, male in no acute distress. Head: Normocephalic, atraumatic.  Neck: Supple without bruits, JVD not elevated. Lungs:  Resp regular and unlabored, CTA without wheezing or rales. Heart: RRR, S1, S2, no S3, S4, or murmur; no rub. Abdomen: Soft, non-tender, non-distended with normoactive bowel sounds. No hepatomegaly. No rebound/guarding. No obvious  abdominal masses. Extremities: No clubbing, cyanosis, or edema. Distal pedal pulses are 2+ bilaterally. Neuro: Alert and oriented X 3. Moves all extremities spontaneously. Psych: Normal affect.   LABS: CBC: Recent Labs  04/26/15 0402 04/27/15 0348  WBC 2.7* 2.7*  HGB 13.7 14.2  HCT 40.8 42.9  MCV 91.1 91.5  PLT 149* 672  Basic Metabolic Panel: Recent Labs  04/26/15 0402 04/27/15 0348  NA 140 141  K 4.1 4.3  CL 108 105  CO2 26 30  GLUCOSE 95 89  BUN 16 13  CREATININE 1.00 0.99  CALCIUM 8.7* 9.1  Cardiac Enzymes: Recent Labs  04/24/15 0918  TROPONINI <0.03  Fasting Lipid Panel: Recent Labs  04/27/15 0348  CHOL 153  HDL 48  LDLCALC 95  TRIG 48  CHOLHDL 3.2    TELE: Sinus bradycardia with rate in mid 30's - 60's. Episodes of Mobitz 1.        ECHO: Echo: 09/16/20106 - Left ventricle: The cavity size was normal. Wall thickness was increased in a pattern of mild LVH. The estimated ejection fraction was 65%. Wall motion was normal; there were no regional wall motion abnormalities. - Aortic valve: Mild central AI. The leaflets are not seen well enough to say if two or three cusps. There is no AS. - Aorta: There is marked dilitation of the ascending aorta. The root measures 6mm. Sino-tubular junction 26mm. Ascending aorta 35mm. -  Right atrium: The atrium was mildly dilated. - Impressions: MARKED DILITATION OF THE ASCENDING AORTA. THIS HAS PROGRESSED SINCE 2010. Impressions: - MARKED DILITATION OF THE ASCENDING AORTA. THIS HAS PROGRESSED SINCE 2010.  Radiology/Studies: Mr Herby Abraham Contrast  04/25/2015   CLINICAL DATA:  Acute presentation with visual disturbance treated with tPA. Followup. Presentation on 04/23/2015.  EXAM: MRI HEAD WITHOUT CONTRAST  TECHNIQUE: Multiplanar, multiecho pulse sequences of the brain and surrounding structures were obtained without intravenous contrast.  COMPARISON:  04/23/2015.  FINDINGS: Diffusion imaging does  not show any acute or subacute infarction. There chronic small-vessel ischemic changes of the pons. No focal cerebellar infarction. Cerebral hemispheres show moderate changes of chronic small vessel disease throughout the deep and subcortical white matter, markedly advanced for age. No cortical or large vessel territory infarction. No mass lesion, hemorrhage, hydrocephalus or extra-axial collection. No pituitary mass. No inflammatory sinus disease. No skull or skullbase lesion.  IMPRESSION: No acute or subacute insult. Chronic small-vessel ischemic changes affecting the brainstem in the cerebral hemispheric white matter, markedly advanced for age.   Electronically Signed   By: Nelson Chimes M.D.   On: 04/25/2015 13:28   Ct Angio Chest Aorta W/cm &/or Wo/cm:: 04/24/2015 CLINICAL DATA: History of hypertension, evaluate for thoracic aortic aneurysm. EXAM: CT ANGIOGRAPHY CHEST WITH CONTRAST TECHNIQUE: Multidetector CT imaging of the chest was performed using the standard protocol during bolus administration of intravenous contrast. Multiplanar CT image reconstructions and MIPs were obtained to evaluate the vascular anatomy. CONTRAST: 197mL OMNIPAQUE IOHEXOL 350 MG/ML SOLN COMPARISON: Chest radiograph - 04/23/2015 FINDINGS: Vascular Findings: There is mild aneurysmal dilatation of the aortic root and ascending thoracic aorta with measurements as follows. Review of the precontrast images are negative for the presence of an intramural hematoma. No evidence of thoracic aortic dissection on this nongated examination. Conventional configuration of the aortic arch. The branch vessels of the aortic arch appear widely patent throughout their imaged course. Although this examination was not tailored for the evaluation of the pulmonary arteries, there are no discrete filling defects within the central pulmonary arterial tree to suggest central pulmonary embolism. Normal caliber of the main pulmonary artery.  Borderline cardiomegaly. Minimal coronary artery calcifications. No pericardial effusion. Thoracic aortic measurements: Aortic root: 48 mm in greatest oblique coronal diameter Sinotubular junction 46 mm as measured in greatest oblique coronal dimension. Proximal ascending aorta 46 mm as measured in greatest oblique axial dimension at the level of the main pulmonary artery an approximately 45 mm in greatest oblique coronal diameter (coronal image 49, series 602). Aortic arch aorta 31 mm as measured in greatest oblique sagittal dimension. Proximal descending thoracic aorta 31 mm as measured in greatest oblique axial dimension at the level of the main pulmonary artery. Distal descending thoracic aorta 28 mm as measured in greatest oblique axial dimension at the level of the diaphragmatic hiatus. Review of the MIP images confirms the above findings. Non-Vascular Findings: Minimal dependent subpleural ground-glass atelectasis within the dependent portions of the bilateral lower lobes, right greater than left. No focal airspace opacities. No pleural effusion or pneumothorax. The central pulmonary airways appear widely patent. No discrete pulmonary nodules. There is a minimal amount of ill-defined soft tissue within the anterior mediastinum favored to represent residual thymic tissue. No mediastinal, hilar axillary lymphadenopathy. Limited early arterial phase evaluation of the upper abdomen demonstrates an approximately 4.1 x 2.8 cm lesion within the subcapsular aspect of the dome of the lateral segment of the left lobe of the liver which demonstrates peripheral  nodular enhancement compatible with a hemangioma (image 88, series 6). There are bilateral hypo attenuating adrenal nodule/masses with dominant right-sided mass measuring 3.5 x 2.7 cm (image 61, series 3 and dominant left-sided adrenal nodule measuring 2.1 x 1.4 cm (61, series 3). Both of these nodules are hypo attenuating (right-sided adrenal  mass measures -5 Hounsfield units and left-sided adrenal nodule measures 0 Hounsfield units) and as such, both are characterized as benign adrenal adenomas. Note is made of an approximately 2.6 cm hypo attenuating lesion arising from the superior pole the left kidney which is incompletely imaged though likely represents a renal cyst (image 104, series 6). There is an additional approximately 1 cm hypo attenuating lesion arising from the superior pole the right kidney which is also incompletely imaged and characterized on this examination though also favored to represent a renal cyst. No acute or aggressive osseous abnormalities. 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 Regional soft tissues appear normal. Normal appearance of the thyroid gland. IMPRESSION: 1. 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 No evidence of thoracic aortic dissection or periaortic stranding. Recommend semi-annual imaging followup by CTA or MRA and referral to cardiothoracic surgery if not already obtained. This recommendation follows 2010 ACCF/AHA/AATS/ACR/ASA/SCA/SCAI/SIR/STS/SVM Guidelines for the Diagnosis and Management of Patients With Thoracic Aortic Disease. Circulation. 2010; 121: G665-L935 2. Borderline cardiomegaly. 3. Coronary artery calcifications. 4. Incidentally noted hepatic hemangioma and bilateral benign adrenal adenomas. Electronically Signed By: Sandi Mariscal M.D. On: 04/24/2015 16:28    Current Medications:  . aspirin  300 mg Rectal Daily   Or  . aspirin  325 mg Oral Daily  . cyanocobalamin  1,000 mcg Intramuscular Q24H  . docusate sodium  100 mg Oral BID  . enoxaparin (LOVENOX) injection  40 mg Subcutaneous Q24H  . feeding supplement (ENSURE ENLIVE)  237 mL Oral BID BM  . hydrALAZINE  25 mg Oral 3 times per day  . simvastatin  10 mg Oral q1800   . 0.9 % NaCl  with KCl 20 mEq / L 75 mL/hr at 04/27/15 0159    ASSESSMENT AND PLAN: Principal Problem:   Dizziness Active Problems:   Heart block AV second degree   Diabetes mellitus type 2, controlled   Essential hypertension   Tobacco abuse   AV block, 2nd degree   Protein-calorie malnutrition, severe   Sensory disturbance   Ascending aorta dilatation  1. Second Degree AV Block:  - Most telemetry strips are Mobitz I second degree AV block with progressive PR prolongation and then a dropped QRS.  -  Episodes where there was no PR prolongation associated with dropped QRS complexes which would be more consistent with Type II have been noted this admission. -  His HR drops into the mid 30's and he has been symptomatic with dizziness and some TIA like symptoms, but has also been noted to be asymptomatic during some of these episodes. - Most likely will need outpt event monitor to try to correlate symptoms with arrhythmias but not sure how compliant patient will be given that he is homeless.  - Avoid nodal agents - keep K>4, Mg >2   2. Hypertensive urgency:  - BP improved. Has been 144/68 - 155/93 in the past 24 hours. - Continue Hydralazine 25mg  TID - avoid BB/CCB due to intermittent heart block  3. Hyperlipidema:  - continue statin therapy  4. Neurologic complaints:  -  MRI of Brain on 04/25/2015 showed chronic small vessel ischemic changes. - May be due to low B12 (169 on 04/23/2015). Being repleted by Admitting Team  5. Ascending Aortic Aneurysm  - significant discrepancy between the CT scan (4.6cm) and echo (6.8cm ascending aortic aneurysm).  - Dr. Servando Snare saw the patient on 04/26/2015. Saw no indication for ascending aortic replacement at this time. Did recommend good BP control, a dental consult, and no cocaine use. Will need follow-up CT chest in 6 months. Z score calculated and equal to  = 3.62 (aorta 4.6, ht 2.05.7, wt 81.6 age 47).  6. Weight Loss  - According to the patient,  he has lost more than 100lbs in the past year - Will need outpatient follow-up   Signed, Erma Heritage , PA-C 8:49 AM 04/27/2015 Pager: [phone]  Patient examined chart reviewed primarily complains about constipation this am.  Dilated aortic root Echo measurement non orthogonal  4.6 cm by CT.  Rx BP  Better today.  Outpatient event monitor For [facility]enkebach.  Have notified office for f/u Dr Oval Linsey in 4-6 weeks  Jenkins Rouge

## 2015-04-27 NOTE — Progress Notes (Signed)
IV removed per discharge order. Discharge instructions and prescriptions given and explained to patient with teach back. Discharged via wheelchair with volunteer services.

## 2015-04-27 NOTE — Discharge Summary (Signed)
Physician Discharge Summary  Javier Rivera VFI:[id number] DOB: 1961-03-09 DOA: 04/23/2015  PCP: Mack Hook, MD  Admit date: 04/23/2015 Discharge date: 04/27/2015  Recommendations for Outpatient Follow-up:  1. Pt will need to follow up with PCP in 1-2 weeks post discharge 2. Please obtain BMP in one week  Discharge Diagnoses:  Dizziness and sensory disturbance -MRI of the brain--small vessel disease -Probably attributable to the patient's low serum B12 -Physical therapy--no follow up needed -HIV and RPR negative -HbA1C= 6.0 -replace B12--1000 g daily 7 days then once per week 1 month, then once monthly -folic 41.6 -UDS neg -Check orthostatics--positive -TSH 2.649 Mobitz type I AV block  -appreciate cardiology followup -Echo-EF 65%, no WMA -avoiding AV nodal blocking agents -continue tele -TSH 2.649 -appreciate EP input--no indication for PPM presently Aortic Root dilatation -Appreciate Dr. Berton Bon indication for aortic root replacement but needsd serial CT chest followup -elevated Z-score but does not meet criteria for marfan's Orthostasis -continue IVF -recheck in am Hypertensive urgency  -increased hydralazine -Prior to admission, patient had not been taking his medications -avoiding AV nodal agents Diabetes mellitus type 2, controlled  -Has not been on any medications for his 9 months now  -Hemoglobin A1c 6.0  -CBGs have likely improve secondary to the patient's recent weight loss  -has not taken glipizide in over one month, does not like the way it makes him feel Atypical chest pain  -Troponins negative 3  -EKG without any concerning ST-T wave changes  Hyperlipidemia  -restart statin  -LDL 124  -Patient has not taken any of his medications for several months  Tobacco abuse  -Tobacco cessation discussed   Severe protein malnutrition -Continue nutritional supplementation  Discharge Condition: stable  Disposition:  home  Diet:cardiac Wt Readings from Last 3 Encounters:  04/27/15 81.784 kg (180 lb 4.8 oz)  02/10/15 84.006 kg (185 lb 3.2 oz)  01/01/15 94.348 kg (208 lb)    History of present illness:  Dizziness and sensory disturbance -MRI of the brain--small vessel disease -Probably attributable to the patient's low serum B12 -Physical therapy--no follow up needed -HIV and RPR negative -HbA1C= 6.0 -replace B12--1000 g daily IM--received 3 doses during hospitalization -due to problems with pt follow, pt was instructed to take B12 606 mcg daily -folic 30.1 -UDS neg -Check orthostatics--positive -TSH 2.649 Mobitz type I AV block  -appreciate cardiology followup -Echo-EF 65%, no WMA -avoiding AV nodal blocking agents -continue tele during hospitalization -TSH 2.649 -appreciate EP input--no indication for PPM presently -cardiology cleared pt for follow up and will arrange for oupt event monitor Aortic Root dilatation -Appreciate Dr. Berton Bon indication for aortic root replacement but needed serial CT chest followup -elevated Z-score but does not meet criteria for marfan's Orthostasis -continue IVF during hospitalization -symptomatically improved Hypertensive urgency  -increased hydralazine 25 mg tid-->improved -Prior to admission, patient had not been taking his medications -avoiding AV nodal agents Diabetes mellitus type 2, controlled  -Has not been on any medications for his 9 months now  -Hemoglobin A1c 6.0  -CBGs have likely improve secondary to the patient's recent weight loss  -has not taken glipizide in over one month, does not like the way it makes him feel -d/c with Januvia 50 mg daily Atypical chest pain  -Troponins negative 3  -EKG without any concerning ST-T wave changes  Hyperlipidemia  -restart statin  -LDL 124  -Patient has not taken any of his medications for several months  Tobacco abuse  -Tobacco cessation discussed   Severe protein  malnutrition -Continue nutritional supplementation  Consultants: Cardiology and TCTS Brief history 54 year old male with history of diabetes mellitus, hypertension presented to the emergency department on 04/23/2015 with numbness of his left hand as well as his legs. The patient was also complaining of dizziness and some blurry vision intermittently for one month. The patient states that standing up from supine position makes his dizziness worse but there are no other exacerbating or alleviating factors. He is a poor historian. In the emergency department, the patient was found to have Mobitz type I AV block and bradycardia. Cardiology was consulted. The patient was also noted to have hypertensive urgency with blood pressure as high as 174/99. The patient was initially started on amlodipine which has been since discontinued in part due to his bradycardia which was as low as the 30s. Echocardiogram was performed and revealed significant dilatation of the ascending aorta up to 68 mm. As a result, CT angio of the chest was obtained to clarify and revealed descending thoracic aorta to be 46 mm. Thoracic surgery has been consulted.  Dr. Dr. Servando Snare saw patient--no indication for aortic root replacement but needed serial CT chest followup.  EP saw pt and did not feel pt needed PPM at this time.  Cardiology will arrange for oupt event monitor Discharge Exam: Filed Vitals:   04/27/15 0418  BP: 144/68  Pulse: 57  Temp: 97.7 F (36.5 C)  Resp: 18   Filed Vitals:   04/26/15 1149 04/26/15 1520 04/26/15 1959 04/27/15 0418  BP: 144/90 149/88 155/93 144/68  Pulse: 61 57 56 57  Temp: 97.8 F (36.6 C)  98.3 F (36.8 C) 97.7 F (36.5 C)  TempSrc: Oral  Oral Oral  Resp: 18  16 18   Height:      Weight:    81.784 kg (180 lb 4.8 oz)  SpO2: 100%  100% 100%   General: A&O x 3, NAD, pleasant, cooperative Cardiovascular: RRR, no rub, no gallop, no S3 Respiratory: CTAB, no wheeze, no rhonchi Abdomen:soft,  nontender, nondistended, positive bowel sounds Extremities: No edema, No lymphangitis, no petechiae  Discharge Instructions      Discharge Instructions    Diet - low sodium heart healthy    Complete by:  As directed      Increase activity slowly    Complete by:  As directed             Medication List    STOP taking these medications        amLODipine 10 MG tablet  Commonly known as:  NORVASC     benazepril 40 MG tablet  Commonly known as:  LOTENSIN     erythromycin ophthalmic ointment     glipiZIDE 5 MG 24 hr tablet  Commonly known as:  GLUCOTROL XL     hydrochlorothiazide 25 MG tablet  Commonly known as:  HYDRODIURIL     metroNIDAZOLE 500 MG tablet  Commonly known as:  FLAGYL      TAKE these medications        aspirin EC 81 MG tablet  Take 81 mg by mouth daily.     cyanocobalamin 500 MCG tablet  Take 1 tablet (500 mcg total) by mouth daily.     feeding supplement (ENSURE ENLIVE) Liqd  Take 237 mLs by mouth 2 (two) times daily between meals.     hydrALAZINE 25 MG tablet  Commonly known as:  APRESOLINE  Take 1 tablet (25 mg total) by mouth every 8 (eight) hours.     simvastatin 10 MG tablet  Commonly known as:  ZOCOR  Take 10 mg by mouth daily.     sitaGLIPtin 50 MG tablet  Commonly known as:  JANUVIA  Take 1 tablet (50 mg total) by mouth daily.         The results of significant diagnostics from this hospitalization (including imaging, microbiology, ancillary and laboratory) are listed below for reference.    Significant Diagnostic Studies: Dg Chest 1 View  04/24/2015   CLINICAL DATA:  54 year old male with dizziness  EXAM: CHEST  1 VIEW  COMPARISON:  Earlier Radiograph dated 04/23/2015  FINDINGS: There has been interval placement of needle markers in the region of the previously seen nodules. There is no focal consolidation, pleural effusion, or pneumothorax. The cardiac silhouette is within normal limits. The osseous structures appear  unremarkable.  IMPRESSION: No active disease.  The previously described pulmonary nodule corresponds to the nipple marker.   Electronically Signed   By: Anner Crete M.D.   On: 04/24/2015 01:24   Dg Chest 2 View  04/23/2015   CLINICAL DATA:  Chest discomfort with some difficulty breathing for some time now, came in today for numbness, tingling in fingers, poor historian  EXAM: CHEST  2 VIEW  COMPARISON:  01/01/2015  FINDINGS: Stable mild hyperinflation. Heart size and vascular pattern are normal. No infiltrate or consolidation. No pleural effusions. 8 mm nodular opacity over the left lower lobe.  IMPRESSION: No acute findings. Possible pulmonary nodule versus nipple shadow on the left. Recommend repeating the PA view with nipple markers.   Electronically Signed   By: Skipper Cliche M.D.   On: 04/23/2015 09:16   Ct Head Wo Contrast  04/23/2015   CLINICAL DATA:  Twenty-four hours post tPA dose.  EXAM: CT HEAD WITHOUT CONTRAST  TECHNIQUE: Contiguous axial images were obtained from the base of the skull through the vertex without intravenous contrast.  COMPARISON:  None.  FINDINGS: No intracranial hemorrhage, mass effect, or midline shift. No cerebral edema. There is mal periventricular and deep white matter hypodensity, most significant in the left parietal lobe, without convincing evidence of territorial infarct. No hydrocephalus. The basilar cisterns are patent. No intracranial fluid collection. Calvarium is intact. Included paranasal sinuses and mastoid air cells are well aerated.  IMPRESSION: 1. No intracranial hemorrhage. 2. Periventricular and deep white matter hypodensity, fairly symmetric, however most prominent involving left parietal lobe. Findings are likely related to chronic small vessel ischemia, there is no convincing evidence of territorial infarct. No prior exams are available for comparison.   Electronically Signed   By: Jeb Levering M.D.   On: 04/23/2015 22:08   Mr Brain Wo  Contrast  04/25/2015   CLINICAL DATA:  Acute presentation with visual disturbance treated with tPA. Followup. Presentation on 04/23/2015.  EXAM: MRI HEAD WITHOUT CONTRAST  TECHNIQUE: Multiplanar, multiecho pulse sequences of the brain and surrounding structures were obtained without intravenous contrast.  COMPARISON:  04/23/2015.  FINDINGS: Diffusion imaging does not show any acute or subacute infarction. There chronic small-vessel ischemic changes of the pons. No focal cerebellar infarction. Cerebral hemispheres show moderate changes of chronic small vessel disease throughout the deep and subcortical white matter, markedly advanced for age. No cortical or large vessel territory infarction. No mass lesion, hemorrhage, hydrocephalus or extra-axial collection. No pituitary mass. No inflammatory sinus disease. No skull or skullbase lesion.  IMPRESSION: No acute or subacute insult. Chronic small-vessel ischemic changes affecting the brainstem in the cerebral hemispheric white matter, markedly advanced for age.   Electronically Signed  By: Nelson Chimes M.D.   On: 04/25/2015 13:28   Ct Angio Chest Aorta W/cm &/or Wo/cm  04/24/2015   CLINICAL DATA:  History of hypertension, evaluate for thoracic aortic aneurysm.  EXAM: CT ANGIOGRAPHY CHEST WITH CONTRAST  TECHNIQUE: Multidetector CT imaging of the chest was performed using the standard protocol during bolus administration of intravenous contrast. Multiplanar CT image reconstructions and MIPs were obtained to evaluate the vascular anatomy.  CONTRAST:  137mL OMNIPAQUE IOHEXOL 350 MG/ML SOLN  COMPARISON:  Chest radiograph - 04/23/2015  FINDINGS: Vascular Findings:  There is mild aneurysmal dilatation of the aortic root and ascending thoracic aorta with measurements as follows.  Review of the precontrast images are negative for the presence of an intramural hematoma. No evidence of thoracic aortic dissection on this nongated examination. Conventional configuration of the  aortic arch. The branch vessels of the aortic arch appear widely patent throughout their imaged course.  Although this examination was not tailored for the evaluation of the pulmonary arteries, there are no discrete filling defects within the central pulmonary arterial tree to suggest central pulmonary embolism. Normal caliber of the main pulmonary artery.  Borderline cardiomegaly. Minimal coronary artery calcifications. No pericardial effusion.  -------------------------------------------------------------  Thoracic aortic measurements:  Aortic root:  48 mm in greatest oblique coronal diameter  Sinotubular junction  46 mm as measured in greatest oblique coronal dimension.  Proximal ascending aorta  46 mm as measured in greatest oblique axial dimension at the level of the main pulmonary artery an approximately 45 mm in greatest oblique coronal diameter (coronal image 49, series 602).  Aortic arch aorta  31 mm as measured in greatest oblique sagittal dimension.  Proximal descending thoracic aorta  31 mm as measured in greatest oblique axial dimension at the level of the main pulmonary artery.  Distal descending thoracic aorta  28 mm as measured in greatest oblique axial dimension at the level of the diaphragmatic hiatus.  Review of the MIP images confirms the above findings.  -------------------------------------------------------------  Non-Vascular Findings:  Minimal dependent subpleural ground-glass atelectasis within the dependent portions of the bilateral lower lobes, right greater than left. No focal airspace opacities. No pleural effusion or pneumothorax. The central pulmonary airways appear widely patent.  No discrete pulmonary nodules.  There is a minimal amount of ill-defined soft tissue within the anterior mediastinum favored to represent residual thymic tissue. No mediastinal, hilar axillary lymphadenopathy.  Limited early arterial phase evaluation of the upper abdomen demonstrates an approximately 4.1 x  2.8 cm lesion within the subcapsular aspect of the dome of the lateral segment of the left lobe of the liver which demonstrates peripheral nodular enhancement compatible with a hemangioma (image 88, series 6).  There are bilateral hypo attenuating adrenal nodule/masses with dominant right-sided mass measuring 3.5 x 2.7 cm (image 61, series 3 and dominant left-sided adrenal nodule measuring 2.1 x 1.4 cm (61, series 3). Both of these nodules are hypo attenuating (right-sided adrenal mass measures -5 Hounsfield units and left-sided adrenal nodule measures 0 Hounsfield units) and as such, both are characterized as benign adrenal adenomas.  Note is made of an approximately 2.6 cm hypo attenuating lesion arising from the superior pole the left kidney which is incompletely imaged though likely represents a renal cyst (image 104, series 6). There is an additional approximately 1 cm hypo attenuating lesion arising from the superior pole the right kidney which is also incompletely imaged and characterized on this examination though also favored to represent a renal cyst.  No acute  or aggressive osseous abnormalities. 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  Regional soft tissues appear normal. Normal appearance of the thyroid gland.  IMPRESSION: 1. 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 No evidence of thoracic aortic dissection or periaortic stranding. Recommend semi-annual imaging followup by CTA or MRA and referral to cardiothoracic surgery if not already obtained. This recommendation follows 2010 ACCF/AHA/AATS/ACR/ASA/SCA/SCAI/SIR/STS/SVM Guidelines for the Diagnosis and Management of Patients With Thoracic Aortic Disease. Circulation. 2010; 121: G269-S854 2. Borderline cardiomegaly. 3. Coronary artery calcifications. 4. Incidentally noted hepatic hemangioma and bilateral benign  adrenal adenomas.   Electronically Signed   By: Sandi Mariscal M.D.   On: 04/24/2015 16:28     Microbiology: No results found for this or any previous visit (from the past 240 hour(s)).   Labs: Basic Metabolic Panel:  Recent Labs Lab 04/23/15 0753 04/23/15 0805 04/23/15 2208 04/24/15 0310 04/26/15 0402 04/27/15 0348  NA 140 142  --  141 140 141  K 3.7 3.7  --  3.6 4.1 4.3  CL 105 102  --  108 108 105  CO2 29  --   --  27 26 30   GLUCOSE 87 83  --  90 95 89  BUN 13 12  --  20 16 13   CREATININE 1.01 1.00 1.08 0.96 1.00 0.99  CALCIUM 9.1  --   --  8.8* 8.7* 9.1   Liver Function Tests:  Recent Labs Lab 04/23/15 0753 04/24/15 0310  AST 14* 14*  ALT 12* 10*  ALKPHOS 66 71  BILITOT 0.8 0.6  PROT 7.2 6.5  ALBUMIN 4.2 3.8    Recent Labs Lab 04/23/15 0753  LIPASE 19*   No results for input(s): AMMONIA in the last 168 hours. CBC:  Recent Labs Lab 04/23/15 0753 04/23/15 0805 04/23/15 2208 04/24/15 0310 04/26/15 0402 04/27/15 0348  WBC 2.1*  --  3.3* 4.1 2.7* 2.7*  HGB 14.9 15.6 14.9 14.1 13.7 14.2  HCT 44.5 46.0 45.0 41.5 40.8 42.9  MCV 91.4  --  90.9 89.8 91.1 91.5  PLT 161  --  155 155 149* 150   Cardiac Enzymes:  Recent Labs Lab 04/23/15 2208 04/24/15 0310 04/24/15 0918  TROPONINI <0.03 <0.03 <0.03   BNP: Invalid input(s): POCBNP CBG:  Recent Labs Lab 04/26/15 0630 04/26/15 1145 04/26/15 1612 04/26/15 2055 04/27/15 0550  GLUCAP 78 112* 102* 97 85    Time coordinating discharge:  Greater than 30 minutes  Signed:  TAT, DAVID, DO [facility] Pager: [phone] 04/27/2015, 11:16 AM

## 2015-04-29 ENCOUNTER — Emergency Department (HOSPITAL_COMMUNITY): Payer: Self-pay

## 2015-04-29 ENCOUNTER — Encounter (HOSPITAL_COMMUNITY): Payer: Self-pay | Admitting: Emergency Medicine

## 2015-04-29 ENCOUNTER — Emergency Department (HOSPITAL_COMMUNITY)
Admission: EM | Admit: 2015-04-29 | Discharge: 2015-04-29 | Disposition: A | Discharge status: Home / Self Care | Payer: Self-pay | Attending: Emergency Medicine | Admitting: Emergency Medicine

## 2015-04-29 DIAGNOSIS — Z79899 Other long term (current) drug therapy: Secondary | ICD-10-CM | POA: Insufficient documentation

## 2015-04-29 DIAGNOSIS — Z7982 Long term (current) use of aspirin: Secondary | ICD-10-CM | POA: Insufficient documentation

## 2015-04-29 DIAGNOSIS — Z87448 Personal history of other diseases of urinary system: Secondary | ICD-10-CM | POA: Insufficient documentation

## 2015-04-29 DIAGNOSIS — E119 Type 2 diabetes mellitus without complications: Secondary | ICD-10-CM | POA: Insufficient documentation

## 2015-04-29 DIAGNOSIS — R42 Dizziness and giddiness: Secondary | ICD-10-CM | POA: Insufficient documentation

## 2015-04-29 DIAGNOSIS — Z720 Tobacco use: Secondary | ICD-10-CM | POA: Insufficient documentation

## 2015-04-29 DIAGNOSIS — I10 Essential (primary) hypertension: Secondary | ICD-10-CM | POA: Insufficient documentation

## 2015-04-29 LAB — CBC
HCT: 48.3 % (ref 39.0–52.0)
Hemoglobin: 15.8 g/dL (ref 13.0–17.0)
MCH: 30.1 pg (ref 26.0–34.0)
MCHC: 32.7 g/dL (ref 30.0–36.0)
MCV: 92 fL (ref 78.0–100.0)
PLATELETS: 204 10*3/uL (ref 150–400)
RBC: 5.25 MIL/uL (ref 4.22–5.81)
RDW: 13.2 % (ref 11.5–15.5)
WBC: 2.8 10*3/uL — AB (ref 4.0–10.5)

## 2015-04-29 LAB — CBG MONITORING, ED: GLUCOSE-CAPILLARY: 87 mg/dL (ref 65–99)

## 2015-04-29 LAB — COMPREHENSIVE METABOLIC PANEL
ALK PHOS: 67 U/L (ref 38–126)
ALT: 31 U/L (ref 17–63)
AST: 23 U/L (ref 15–41)
Albumin: 4.6 g/dL (ref 3.5–5.0)
Anion gap: 7 (ref 5–15)
BUN: 17 mg/dL (ref 6–20)
CALCIUM: 9.1 mg/dL (ref 8.9–10.3)
CHLORIDE: 106 mmol/L (ref 101–111)
CO2: 27 mmol/L (ref 22–32)
CREATININE: 0.92 mg/dL (ref 0.61–1.24)
Glucose, Bld: 92 mg/dL (ref 65–99)
Potassium: 4.1 mmol/L (ref 3.5–5.1)
Sodium: 140 mmol/L (ref 135–145)
Total Bilirubin: 0.8 mg/dL (ref 0.3–1.2)
Total Protein: 7.8 g/dL (ref 6.5–8.1)

## 2015-04-29 MED ORDER — MECLIZINE HCL 25 MG PO TABS: 25.0000 mg | ORAL_TABLET | Freq: Three times a day (TID) | ORAL | Status: DC | PRN

## 2015-04-29 MED ORDER — MECLIZINE HCL 25 MG PO TABS
25.0000 mg | ORAL_TABLET | Freq: Once | ORAL | Status: AC
  Administered 2015-04-29: 25 mg via ORAL
  Filled 2015-04-29: qty 1

## 2015-04-29 NOTE — ED Notes (Signed)
Per EMS, pt was picked up at [place].  He told passerby to call 911.  EMS states difficult to determine complaint for call.  Only thing mentioned throughout transport was some numbness in 2 fingers.  Pt ambulatory.  Recently d/c from [facility] with CHF.  Vitals: 148/104, hr 68, 100% ra, resp 18, cbg 88

## 2015-04-29 NOTE — ED Notes (Addendum)
Per patient states he was recently discharged from [facility] and he said his dizziness that he had there never fully went away. States this morning his dizziness got worse when he went to stand up. Alert and oriented x 4, not complaining of any new pain. Patient's train of thought random, difficult to get information out of him. Will answer direct/specific questions. Pt states he's supposed to be taking medications, but hasn't and states we never gave them back to him from when he left [facility]. Also complaining of losing a lot of weight because he hasn't been able to eat like he used to.

## 2015-04-29 NOTE — ED Provider Notes (Signed)
CSN: [id number]     Arrival date & time 04/29/15  1355 History   First MD Initiated Contact with Patient 04/29/15 1618     Chief Complaint  Patient presents with  . Altered Mental Status  . Dizziness  . Medical Clearance     (Consider location/radiation/quality/duration/timing/severity/associated sxs/prior Treatment) Patient is a 54 y.o. male presenting with dizziness. The history is provided by the patient.  Dizziness Quality:  Lightheadedness and room spinning Severity:  Moderate Onset quality:  Sudden Timing:  Constant Progression:  Improving Chronicity:  Chronic Context: physical activity   Context: not when standing up   Relieved by: rest. Worsened by:  Turning head Associated symptoms: no chest pain and no shortness of breath     Past Medical History  Diagnosis Date  . Diabetes mellitus without complication   . Hypertension   . Renal disorder     pt stated that masses were found on his kidneys   Past Surgical History  Procedure Laterality Date  . Knee surgery      bilateral   Family History  Problem Relation Age of Onset  . Diabetes Mellitus II Mother   . Hypertension Mother   . Diabetes Mellitus II Sister   . Hypertension Sister    Social History  Substance Use Topics  . Smoking status: Current Every Day Smoker -- 0.25 packs/day for 20 years    Types: Cigarettes  . Smokeless tobacco: Never Used  . Alcohol Use: No    Review of Systems  Respiratory: Negative for cough and shortness of breath.   Cardiovascular: Negative for chest pain.  Neurological: Positive for dizziness.  All other systems reviewed and are negative.     Allergies  Lisinopril  Home Medications   Prior to Admission medications   Medication Sig Start Date End Date Taking? Authorizing Provider  aspirin EC 81 MG tablet Take 81 mg by mouth daily.   Yes Historical Provider, MD  feeding supplement, ENSURE ENLIVE, (ENSURE ENLIVE) LIQD Take 237 mLs by mouth 2 (two) times daily  between meals. 04/27/15  Yes Orson Eva, MD  simvastatin (ZOCOR) 10 MG tablet Take 10 mg by mouth daily.   Yes Historical Provider, MD  hydrALAZINE (APRESOLINE) 25 MG tablet Take 1 tablet (25 mg total) by mouth every 8 (eight) hours. Patient not taking: Reported on 04/29/2015 04/27/15   Orson Eva, MD  sitaGLIPtin (JANUVIA) 50 MG tablet Take 1 tablet (50 mg total) by mouth daily. Patient not taking: Reported on 04/29/2015 04/27/15   Orson Eva, MD  vitamin B-12 500 MCG tablet Take 1 tablet (500 mcg total) by mouth daily. Patient not taking: Reported on 04/29/2015 04/27/15   Orson Eva, MD   BP 124/93 mmHg  Pulse 64  Temp(Src) 97.6 F (36.4 C) (Oral)  Resp 16  Ht 6\' 9"  (2.057 m)  Wt 177 lb 4 oz (80.4 kg)  BMI 19.00 kg/m2  SpO2 100% Physical Exam  Constitutional: He is oriented to person, place, and time. He appears well-developed and well-nourished. No distress.  HENT:  Head: Normocephalic and atraumatic.  Mouth/Throat: No oropharyngeal exudate.  Eyes: EOM are normal. Pupils are equal, round, and reactive to light.  Neck: Normal range of motion. Neck supple.  Cardiovascular: Normal rate and regular rhythm.  Exam reveals no friction rub.   No murmur heard. Pulmonary/Chest: Effort normal and breath sounds normal. No respiratory distress. He has no wheezes. He has no rales.  Abdominal: He exhibits no distension. There is no tenderness. There is  no rebound.  Musculoskeletal: Normal range of motion. He exhibits no edema.  Neurological: He is alert and oriented to person, place, and time. No cranial nerve deficit. He exhibits normal muscle tone. Coordination and gait normal. GCS eye subscore is 4. GCS verbal subscore is 5. GCS motor subscore is 6.  Skin: He is not diaphoretic.  Nursing note and vitals reviewed.   ED Course  Procedures (including critical care time) Labs Review Labs Reviewed  CBC - Abnormal; Notable for the following:    WBC 2.8 (*)    All other components within normal limits   COMPREHENSIVE METABOLIC PANEL  CBG MONITORING, ED    Imaging Review Dg Chest 2 View  04/29/2015   CLINICAL DATA:  Dizziness, hand numbness  EXAM: CHEST  2 VIEW  COMPARISON:  Chest CT 04/24/2015, chest radiograph 04/23/2015  FINDINGS: The heart size and mediastinal contours are within normal limits. Both lungs are clear. The visualized skeletal structures are unremarkable. Nipple shadows are reidentified over the lung bases. This was previously verified on prior exam. Ascending aortic ectasia reidentified.  IMPRESSION: No active cardiopulmonary disease.   Electronically Signed   By: Conchita Paris M.D.   On: 04/29/2015 18:04   I have personally reviewed and evaluated these images and lab results as part of my medical decision-making.   EKG Interpretation None      MDM   Final diagnoses:  Dizziness    54 year old male here with episode of dizziness. He was recently admitted for dizziness, he said at the time of it starting he had severe spinning sensation. He also had some intermittent chest pain. He is evaluated by cardiology in the hospital and had a normal MRI of the brain, normal laboratory workup. He did have an echo showing aortic dilatation. He was orthostatic on admission.. Today he was walking around and felt dizzy again. He stated it was not as severe as last week. He reports being homeless and he has not eaten today. He is not orthostatic here. His vitals are stable. Has a normal neurologic exam. He's asking for some knee. I do not feel like he warrants admission and with recent negative workup, we'll check EKG and give meclizine.  States no major improvement, but is walking and doing well. Stable for discharge. There is a mild component of malingering as he is homeless  Evelina Bucy, MD 04/29/15 [phone]

## 2015-04-29 NOTE — ED Notes (Signed)
Pt seen and treated in ED on 9/15 for similar complaints on finger tingling/numbness.

## 2015-04-29 NOTE — ED Notes (Signed)
Pt was just discharged from [facility] on Monday.  He returns today because of numbness in his hands (left hand worse).  He c/o dizziness and thought he was going to pass out; however, he did not.  He also complains of "tingling sensations in his head" which he cannot really describe.  He states he feels he is constipated.  LBM 2 days ago.

## 2015-04-29 NOTE — Discharge Instructions (Signed)

## 2015-05-26 ENCOUNTER — Encounter: Payer: Self-pay | Admitting: Physician Assistant

## 2015-05-26 NOTE — Progress Notes (Deleted)
Cardiology Office Note   Date:  05/26/2015   ID:  Javier Rivera, DOB May 15, 1961, MRN [id number]  PCP:  Mack Hook, MD  Cardiologist:  Dr Oliver Barre, PA-C   No chief complaint on file.   History of Present Illness: Javier Rivera is a 54 y.o. male with a history of DM, HL, tobacco use and homelessness.  He was admitted to the hospital 04/23/2015 with multiple complaints including hypertensive urgency, neurologic complaints and Mobitz 1 on telemetry. There were no symptoms clearly attributable to the bradycardia. 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 His Z score was elevated but he did not meet criteria for Marfan's. An additional ER visit is noted for dizziness on 04/29/2015, and his ECG at that time shows sinus  Bradycardia, rate 50.  Javier Rivera presents for    Past Medical History  Diagnosis Date  . Diabetes mellitus without complication   . Hypertension   . Renal disorder     pt stated that masses were found on his kidneys    Past Surgical History  Procedure Laterality Date  . Knee surgery      bilateral    Current Outpatient Prescriptions  Medication Sig Dispense Refill  . aspirin EC 81 MG tablet Take 81 mg by mouth daily.    . feeding supplement, ENSURE ENLIVE, (ENSURE ENLIVE) LIQD Take 237 mLs by mouth 2 (two) times daily between meals. 60 Bottle 0  . hydrALAZINE (APRESOLINE) 25 MG tablet Take 1 tablet (25 mg total) by mouth every 8 (eight) hours. (Patient not taking: Reported on 04/29/2015) 90 tablet 1  . meclizine (ANTIVERT) 25 MG tablet Take 1 tablet (25 mg total) by mouth 3 (three) times daily as needed for dizziness. 30 tablet 0  . simvastatin (ZOCOR) 10 MG tablet Take 10 mg by mouth daily.    . sitaGLIPtin (JANUVIA) 50 MG tablet Take 1 tablet (50 mg total) by mouth daily. (Patient not taking: Reported on 04/29/2015) 30 tablet 1  . vitamin B-12 500  MCG tablet Take 1 tablet (500 mcg total) by mouth daily. (Patient not taking: Reported on 04/29/2015) 30 tablet 1   No current facility-administered medications for this visit.    Allergies:   Lisinopril    Social History:  The patient  reports that he has been smoking Cigarettes.  He has a 5 pack-year smoking history. He has never used smokeless tobacco. He reports that he does not drink alcohol or use illicit drugs.   Family History:  The patient's family history includes Diabetes Mellitus II in his mother and sister; Hypertension in his mother and sister.    ROS:  Please see the history of present illness. All other systems are reviewed and negative.    PHYSICAL EXAM: VS:  There were no vitals taken for this visit. , BMI There is no weight on file to calculate BMI. GEN: Well nourished, well developed, male in no acute distress HEENT: normal for age  Neck: no JVD, no carotid bruit, no masses Cardiac: RRR; no murmur, no rubs, or gallops Respiratory:  clear to auscultation bilaterally, normal work of breathing GI: soft, nontender, nondistended, + BS MS: no deformity or atrophy; no edema; distal pulses are 2+ in all 4 extremities  Skin: warm and dry, no rash Neuro:  Strength and sensation are intact Psych: euthymic mood, full affect   EKG:  EKG {ACTION; IS/IS [id number]}  ordered today. The ekg ordered today demonstrates ***   Recent Labs: 04/23/2015: TSH 2.649 04/29/2015: ALT 31; BUN 17; Creatinine, Ser 0.92; Hemoglobin 15.8; Platelets 204; Potassium 4.1; Sodium 140    Lipid Panel    Component Value Date/Time   CHOL 153 04/27/2015 0348   TRIG 48 04/27/2015 0348   HDL 48 04/27/2015 0348   CHOLHDL 3.2 04/27/2015 0348   VLDL 10 04/27/2015 0348   LDLCALC 95 04/27/2015 0348     Wt Readings from Last 3 Encounters:  04/29/15 177 lb 4 oz (80.4 kg)  04/27/15 180 lb 4.8 oz (81.784 kg)  02/10/15 185 lb 3.2 oz (84.006 kg)     Other studies Reviewed: Additional studies/  records that were reviewed today include: ***.  ASSESSMENT AND PLAN:  1.  ***   Current medicines are reviewed at length with the patient today.  The patient {ACTIONS; HAS/DOES NOT HAVE:19233} concerns regarding medicines.  The following changes have been made:  {PLAN; NO CHANGE:13088:s}  Labs/ tests ordered today include: *** No orders of the defined types were placed in this encounter.     Disposition:   FU with ***  Signed, Rosaria Ferries, PA-C  05/26/2015 2:06 PM    [facility] [place], [place], [place]  [address] Phone: [phone]; Fax: [phone]

## 2015-06-08 NOTE — Progress Notes (Signed)
Quick Note:  06/08/15 9:38 am. Ticket submitted to [id number]. Incident # [id number]. ______

## 2015-09-16 ENCOUNTER — Encounter (HOSPITAL_COMMUNITY): Payer: Self-pay

## 2015-09-16 ENCOUNTER — Emergency Department (HOSPITAL_COMMUNITY)
Admission: EM | Admit: 2015-09-16 | Discharge: 2015-09-16 | Disposition: A | Discharge status: Home / Self Care | Payer: Self-pay | Attending: Emergency Medicine | Admitting: Emergency Medicine

## 2015-09-16 ENCOUNTER — Emergency Department (HOSPITAL_COMMUNITY): Payer: Self-pay

## 2015-09-16 DIAGNOSIS — K5909 Other constipation: Secondary | ICD-10-CM

## 2015-09-16 DIAGNOSIS — E119 Type 2 diabetes mellitus without complications: Secondary | ICD-10-CM | POA: Insufficient documentation

## 2015-09-16 DIAGNOSIS — F17210 Nicotine dependence, cigarettes, uncomplicated: Secondary | ICD-10-CM | POA: Insufficient documentation

## 2015-09-16 DIAGNOSIS — M79672 Pain in left foot: Secondary | ICD-10-CM | POA: Insufficient documentation

## 2015-09-16 DIAGNOSIS — Z7982 Long term (current) use of aspirin: Secondary | ICD-10-CM | POA: Insufficient documentation

## 2015-09-16 DIAGNOSIS — Z87448 Personal history of other diseases of urinary system: Secondary | ICD-10-CM | POA: Insufficient documentation

## 2015-09-16 DIAGNOSIS — M79671 Pain in right foot: Secondary | ICD-10-CM | POA: Insufficient documentation

## 2015-09-16 DIAGNOSIS — Z590 Homelessness: Secondary | ICD-10-CM | POA: Insufficient documentation

## 2015-09-16 DIAGNOSIS — R0602 Shortness of breath: Secondary | ICD-10-CM

## 2015-09-16 DIAGNOSIS — K5900 Constipation, unspecified: Secondary | ICD-10-CM | POA: Insufficient documentation

## 2015-09-16 DIAGNOSIS — I10 Essential (primary) hypertension: Secondary | ICD-10-CM | POA: Insufficient documentation

## 2015-09-16 LAB — I-STAT CHEM 8, ED
BUN: 14 mg/dL (ref 6–20)
CALCIUM ION: 1.13 mmol/L (ref 1.12–1.23)
CHLORIDE: 102 mmol/L (ref 101–111)
Creatinine, Ser: 1.1 mg/dL (ref 0.61–1.24)
GLUCOSE: 84 mg/dL (ref 65–99)
HCT: 45 % (ref 39.0–52.0)
Hemoglobin: 15.3 g/dL (ref 13.0–17.0)
POTASSIUM: 3.9 mmol/L (ref 3.5–5.1)
Sodium: 141 mmol/L (ref 135–145)
TCO2: 27 mmol/L (ref 0–100)

## 2015-09-16 LAB — I-STAT TROPONIN, ED: Troponin i, poc: 0 ng/mL (ref 0.00–0.08)

## 2015-09-16 MED ORDER — POLYETHYLENE GLYCOL 3350 17 G PO PACK: 17.0000 g | PACK | Freq: Every day | ORAL | Status: DC

## 2015-09-16 NOTE — ED Notes (Signed)
I have just brought him two sandwiches and a drink per his request.

## 2015-09-16 NOTE — ED Notes (Signed)
He states he had felt short of breath this morning and was concerned enough to phone EMS; however, by the time he got there he no longer felt short of breath.  He also tells me he has had some issues with constipation and "trouble with my feet--they're kinda sore".  He ambulates capably and is in no distress.

## 2015-09-16 NOTE — ED Provider Notes (Signed)
Medical screening examination/treatment/procedure(s) were conducted as a shared visit with non-physician practitioner(s) and myself.  I personally evaluated the patient during the encounter.   EKG Interpretation   Date/Time:  Wednesday September 16 2015 10:54:16 EST Ventricular Rate:  53 PR Interval:  262 QRS Duration: 101 QT Interval:  432 QTC Calculation: 406 R Axis:   72 Text Interpretation:  Sinus rhythm Prolonged PR interval Anteroseptal  infarct, age indeterminate st elevation in inferior leads.  Seen in prior  ecg inverted t waves in I, avL also seen in prior Confirmed by Antrell Tipler MD,  DANIEL [id number]) on 09/16/2015 11:00:11 AM      EKG Interpretation  Date/Time:  Wednesday September 16 2015 12:25:37 EST Ventricular Rate:  52 PR Interval:  385 QRS Duration: 81 QT Interval:  437 QTC Calculation: 406 R Axis:   72 Text Interpretation:  Sinus rhythm Prolonged PR interval Anteroseptal infarct, age indeterminate No significant change since last tracing Confirmed by Esmeralda Blanford MD, DANIEL [phone]) on 09/16/2015 12:56:03 PM         See the written copy of this report in the patient's paper medical record.  These results did not interface directly into the electronic medical record and are summarized here.  55 yo M with a chief complaint of shortness of breath. Patient states that he feels really congested so target for him to take a deep breath. If he has been blowing his nose denied anything out as had a mild cough as well. Denied any chest pain but has had some palpitations here and there since is normal for him with a regular heart rhythm. Initial EKG was concerning with ST elevation less than 1 mm in the inferior leads with lateral concordant changes. Discussed the patient is not currently having any chest pain if he is still short of breath but refers to it as intranasally. Elevated the patient's chest and he had EKG leads that were not in the anatomical locations. I readjusted them in we  repeated an EKG that no longer had significant ST elevation. Patient is currently not having any significant symptoms. Protect this is very unlikely to be an MI troponin was negative. Discharge home.    Deno Etienne, DO 09/16/15 1259

## 2015-09-16 NOTE — ED Notes (Signed)
Bed: [id number] Expected date:  Expected time:  Means of arrival:  Comments: EMS- initially shortness of breath, now no complaints

## 2015-09-16 NOTE — Discharge Instructions (Signed)
Use miralax daily. Increase water and fiber intake in your diet. Soak your feet in warm water and keep them clean to help with pain. Use tylenol or motrin as needed for pain. Follow up with [facility] in 1-2 weeks to establish care and discuss your chronic medical complaints. Return to the ER for changes or worsening symptoms.   Constipation, Adult Constipation is when a person:  Poops (has a bowel movement) less than 3 times a week.  Has a hard time pooping.  Has poop that is dry, hard, or bigger than normal. HOME CARE   Eat foods with a lot of fiber in them. This includes fruits, vegetables, beans, and whole grains such as brown rice.  Avoid fatty foods and foods with a lot of sugar. This includes french fries, hamburgers, cookies, candy, and soda.  If you are not getting enough fiber from food, take products with added fiber in them (supplements).  Drink enough fluid to keep your pee (urine) clear or pale yellow.  Exercise on a regular basis, or as told by your doctor.  Go to the restroom when you feel like you need to poop. Do not hold it.  Only take medicine as told by your doctor. Do not take medicines that help you poop (laxatives) without talking to your doctor first. GET HELP RIGHT AWAY IF:   You have bright red blood in your poop (stool).  Your constipation lasts more than 4 days or gets worse.  You have belly (abdominal) or butt (rectal) pain.  You have thin poop (as thin as a pencil).  You lose weight, and it cannot be explained. MAKE SURE YOU:   Understand these instructions.  Will watch your condition.  Will get help right away if you are not doing well or get worse.   This information is not intended to replace advice given to you by your health care provider. Make sure you discuss any questions you have with your health care provider.   Document Released: 01/11/2008 Document Revised: 08/15/2014 Document Reviewed: 05/06/2013 Elsevier  Interactive Patient Education 2016 Elsevier Inc.  High-Fiber Diet Fiber, also called dietary fiber, is a type of carbohydrate found in fruits, vegetables, whole grains, and beans. A high-fiber diet can have many health benefits. Your health care provider may recommend a high-fiber diet to help:  Prevent constipation. Fiber can make your bowel movements more regular.  Lower your cholesterol.  Relieve hemorrhoids, uncomplicated diverticulosis, or irritable bowel syndrome.  Prevent overeating as part of a weight-loss plan.  Prevent heart disease, type 2 diabetes, and certain cancers. WHAT IS MY PLAN? The recommended daily intake of fiber includes:  38 grams for men under age 56.  22 grams for men over age 30.  56 grams for women under age 30.  47 grams for women over age 70. You can get the recommended daily intake of dietary fiber by eating a variety of fruits, vegetables, grains, and beans. Your health care provider may also recommend a fiber supplement if it is not possible to get enough fiber through your diet. WHAT DO I NEED TO KNOW ABOUT A HIGH-FIBER DIET?  Fiber supplements have not been widely studied for their effectiveness, so it is better to get fiber through food sources.  Always check the fiber content on thenutrition facts label of any prepackaged food. Look for foods that contain at least 5 grams of fiber per serving.  Ask your dietitian if you have questions about specific foods that are  related to your condition, especially if those foods are not listed in the following section.  Increase your daily fiber consumption gradually. Increasing your intake of dietary fiber too quickly may cause bloating, cramping, or gas.  Drink plenty of water. Water helps you to digest fiber. WHAT FOODS CAN I EAT? Grains Whole-grain breads. Multigrain cereal. Oats and oatmeal. Brown rice. Barley. Bulgur wheat. [place]. Bran muffins. Popcorn. Rye wafer crackers. Vegetables Sweet  potatoes. Spinach. Kale. Artichokes. Cabbage. Broccoli. Green peas. Carrots. Squash. Fruits Berries. Pears. Apples. Oranges. Avocados. Prunes and raisins. Dried figs. Meats and Other Protein Sources [place], kidney, pinto, and soy beans. Split peas. Lentils. Nuts and seeds. Dairy Fiber-fortified yogurt. Beverages Fiber-fortified soy milk. Fiber-fortified orange juice. Other Fiber bars. The items listed above may not be a complete list of recommended foods or beverages. Contact your dietitian for more options. WHAT FOODS ARE NOT RECOMMENDED? Grains White bread. Pasta made with refined flour. White rice. Vegetables Fried potatoes. Canned vegetables. Well-cooked vegetables.  Fruits Fruit juice. Cooked, strained fruit. Meats and Other Protein Sources Fatty cuts of meat. Fried Sales executive or fried fish. Dairy Milk. Yogurt. Cream cheese. Sour cream. Beverages Soft drinks. Other Cakes and pastries. Butter and oils. The items listed above may not be a complete list of foods and beverages to avoid. Contact your dietitian for more information. WHAT ARE SOME TIPS FOR INCLUDING HIGH-FIBER FOODS IN MY DIET?  Eat a wide variety of high-fiber foods.  Make sure that half of all grains consumed each day are whole grains.  Replace breads and cereals made from refined flour or white flour with whole-grain breads and cereals.  Replace white rice with brown rice, bulgur wheat, or millet.  Start the day with a breakfast that is high in fiber, such as a cereal that contains at least 5 grams of fiber per serving.  Use beans in place of meat in soups, salads, or pasta.  Eat high-fiber snacks, such as berries, raw vegetables, nuts, or popcorn.   This information is not intended to replace advice given to you by your health care provider. Make sure you discuss any questions you have with your health care provider.   Document Released: 07/25/2005 Document Revised: 08/15/2014 Document Reviewed:  01/07/2014 Elsevier Interactive Patient Education 2016 Elsevier Inc.  Musculoskeletal Pain Musculoskeletal pain is muscle and boney aches and pains. These pains can occur in any part of the body. Your caregiver may treat you without knowing the cause of the pain. They may treat you if blood or urine tests, X-rays, and other tests were normal.  CAUSES There is often not a definite cause or reason for these pains. These pains may be caused by a type of germ (virus). The discomfort may also come from overuse. Overuse includes working out too hard when your body is not fit. Boney aches also come from weather changes. Bone is sensitive to atmospheric pressure changes. HOME CARE INSTRUCTIONS   Ask when your test results will be ready. Make sure you get your test results.  Only take over-the-counter or prescription medicines for pain, discomfort, or fever as directed by your caregiver. If you were given medications for your condition, do not drive, operate machinery or power tools, or sign legal documents for 24 hours. Do not drink alcohol. Do not take sleeping pills or other medications that may interfere with treatment.  Continue all activities unless the activities cause more pain. When the pain lessens, slowly resume normal activities. Gradually increase the intensity and duration of the  activities or exercise.  During periods of severe pain, bed rest may be helpful. Lay or sit in any position that is comfortable.  Putting ice on the injured area.  Put ice in a bag.  Place a towel between your skin and the bag.  Leave the ice on for 15 to 20 minutes, 3 to 4 times a day.  Follow up with your caregiver for continued problems and no reason can be found for the pain. If the pain becomes worse or does not go away, it may be necessary to repeat tests or do additional testing. Your caregiver may need to look further for a possible cause. SEEK IMMEDIATE MEDICAL CARE IF:  You have pain that is  getting worse and is not relieved by medications.  You develop chest pain that is associated with shortness or breath, sweating, feeling sick to your stomach (nauseous), or throw up (vomit).  Your pain becomes localized to the abdomen.  You develop any new symptoms that seem different or that concern you. MAKE SURE YOU:   Understand these instructions.  Will watch your condition.  Will get help right away if you are not doing well or get worse.   This information is not intended to replace advice given to you by your health care provider. Make sure you discuss any questions you have with your health care provider.   Document Released: 07/25/2005 Document Revised: 10/17/2011 Document Reviewed: 03/29/2013 Elsevier Interactive Patient Education 2016 [place] of Breath Shortness of breath means you have trouble breathing. Shortness of breath needs medical care right away. HOME CARE   Do not smoke.  Avoid being around chemicals or things (paint fumes, dust) that may bother your breathing.  Rest as needed. Slowly begin your normal activities.  Only take medicines as told by your doctor.  Keep all doctor visits as told. GET HELP RIGHT AWAY IF:   Your shortness of breath gets worse.  You feel lightheaded, pass out (faint), or have a cough that is not helped by medicine.  You cough up blood.  You have pain with breathing.  You have pain in your chest, arms, shoulders, or belly (abdomen).  You have a fever.  You cannot walk up stairs or exercise the way you normally do.  You do not get better in the time expected.  You have a hard time doing normal activities even with rest.  You have problems with your medicines.  You have any new symptoms. MAKE SURE YOU:  Understand these instructions.  Will watch your condition.  Will get help right away if you are not doing well or get worse.   This information is not intended to replace advice given to you  by your health care provider. Make sure you discuss any questions you have with your health care provider.   Document Released: 01/11/2008 Document Revised: 07/30/2013 Document Reviewed: 10/10/2011 Elsevier Interactive Patient Education [employer].

## 2015-09-16 NOTE — ED Provider Notes (Signed)
CSN: [id number]     Arrival date & time 09/16/15  [id number] History   First MD Initiated Contact with Patient 09/16/15 [phone]     Chief Complaint  Patient presents with  . Shortness of Breath     (Consider location/radiation/quality/duration/timing/severity/associated sxs/prior Treatment) HPI Comments: Javier Rivera is a 55 y.o. male with a PMHx of DM2, HTN, HLD, aortic root dilation (per Dr. Servando Snare, no recommendation for replacement), [place] I AV block, renal disorder, chronic dizziness/lightheadedness, and homelessness, who presents to the ED with Multiple complaints. Level V caveat due to patient being a poor historian and having a very convoluted unclear story of why he is here. Patient initially called out to EMS for complaints of shortness of breath, upon arrival he told nursing staff that he no longer felt short of breath. When I asked whether he continued feel short of breath now, he states he is not sure whether he could say that it completely resolved or not, will not endorse or decline whether he is still short of breath, states that it initially started at 3 AM and he thinks it was "due to being where I was" but won't elaborate on that any further. He states that leaving the area that he was in helped his shortness of breath. He said he felt his heart beating in his ears but denies palpitations. Additionally he states that he wants to be seen for his chronic constipation has been ongoing for 1 year, he had his last bowel movement yesterday which was hard, he states that he has tried "all of the laxatives" and that none of them help. He also states he has had bilateral foot pain for 2 years which she describes as 10/10 constant throbbing nonradiating pain which improves with walking and worsens with being still. He has not tried anything PTA for his symptoms. He states his foot pain started with callouses on his feet and has persisted x50yrs.  He has chronic peripheral neuropathy but no new numbness  or tingling, no focal weakness. He endorses being a smoker. He states he has chronic abd pain but none today. He denies any diaphoresis or lightheadedness, chest pain, cough, wheezing, recent travel/surgery/immobilization, history of DVT/PE, fevers, chills, erythema or warmth of the legs, leg swelling, new abdominal pain, nausea, vomiting, diarrhea, obstipation, melena, hematochezia, or any urinary complaints.  He also tells me he hasn't eaten today, and would like something to eat  Patient is a 55 y.o. male presenting with shortness of breath. The history is provided by medical records and the patient. No language interpreter was used.  Shortness of Breath Severity:  Mild Onset quality:  Gradual Duration:  6 hours Timing:  Sporadic Progression:  Resolved Chronicity:  Recurrent Context comment:  "being where I was" Relieved by: "leaving the area I was in" Exacerbated by: "being where I was" Ineffective treatments:  None tried Associated symptoms: no abdominal pain, no chest pain, no cough, no diaphoresis, no fever, no vomiting and no wheezing   Risk factors: tobacco use   Risk factors: no hx of PE/DVT, no prolonged immobilization and no recent surgery     Past Medical History  Diagnosis Date  . Diabetes mellitus without complication   . Hypertension   . Renal disorder     pt stated that masses were found on his kidneys   Past Surgical History  Procedure Laterality Date  . Knee surgery      bilateral   Family History  Problem Relation Age of Onset  .  Diabetes Mellitus II Mother   . Hypertension Mother   . Diabetes Mellitus II Sister   . Hypertension Sister    Social History  Substance Use Topics  . Smoking status: Current Every Day Smoker -- 0.25 packs/day for 20 years    Types: Cigarettes  . Smokeless tobacco: Never Used  . Alcohol Use: No    Review of Systems  Constitutional: Negative for fever, chills and diaphoresis.  Respiratory: Positive for shortness of breath  (resolved). Negative for cough and wheezing.   Cardiovascular: Negative for chest pain, palpitations and leg swelling.  Gastrointestinal: Positive for constipation (chronic x19yr, last BM yesterday). Negative for nausea, vomiting, abdominal pain, diarrhea and blood in stool.  Genitourinary: Negative for dysuria and hematuria.  Musculoskeletal: Positive for myalgias (b/l feet). Negative for arthralgias.  Skin: Negative for color change and wound.  Allergic/Immunologic: Positive for immunocompromised state (diabetic).  Neurological: Negative for weakness, light-headedness and numbness.  Psychiatric/Behavioral: Negative for confusion.   10 Systems reviewed and are negative for acute change except as noted in the HPI.    Allergies  Lisinopril  Home Medications   Prior to Admission medications   Medication Sig Start Date End Date Taking? Authorizing Provider  aspirin EC 81 MG tablet Take 81 mg by mouth daily.    Historical Provider, MD  feeding supplement, ENSURE ENLIVE, (ENSURE ENLIVE) LIQD Take 237 mLs by mouth 2 (two) times daily between meals. 04/27/15   Orson Eva, MD  hydrALAZINE (APRESOLINE) 25 MG tablet Take 1 tablet (25 mg total) by mouth every 8 (eight) hours. Patient not taking: Reported on 04/29/2015 04/27/15   Orson Eva, MD  meclizine (ANTIVERT) 25 MG tablet Take 1 tablet (25 mg total) by mouth 3 (three) times daily as needed for dizziness. 04/29/15   Evelina Bucy, MD  simvastatin (ZOCOR) 10 MG tablet Take 10 mg by mouth daily.    Historical Provider, MD  sitaGLIPtin (JANUVIA) 50 MG tablet Take 1 tablet (50 mg total) by mouth daily. Patient not taking: Reported on 04/29/2015 04/27/15   Orson Eva, MD  vitamin B-12 500 MCG tablet Take 1 tablet (500 mcg total) by mouth daily. Patient not taking: Reported on 04/29/2015 04/27/15   Orson Eva, MD   BP 178/110 mmHg  Pulse 60  Temp(Src) 98.3 F (36.8 C) (Oral)  Resp 18  SpO2 100% Physical Exam  Constitutional: He is oriented to person,  place, and time. He appears well-developed and well-nourished.  Non-toxic appearance. No distress.  Afebrile, nontoxic, NAD. HTN noted, which is similar to prior visits. Watching TV throughout the ENTIRE visit  HENT:  Head: Normocephalic and atraumatic.  Mouth/Throat: Oropharynx is clear and moist and mucous membranes are normal.  Eyes: Conjunctivae and EOM are normal. Right eye exhibits no discharge. Left eye exhibits no discharge.  Neck: Normal range of motion. Neck supple.  Cardiovascular: Normal rate, regular rhythm, normal heart sounds and intact distal pulses.  Exam reveals no gallop and no friction rub.   No murmur heard. RRR, nl s1/s2, no m/r/g, distal pulses intact, no pedal edema   Pulmonary/Chest: Effort normal and breath sounds normal. No respiratory distress. He has no decreased breath sounds. He has no wheezes. He has no rhonchi. He has no rales.  CTAB in all lung fields, no w/r/r, no hypoxia or increased WOB, speaking in full sentences, SpO2 100% on RA   Abdominal: Soft. Normal appearance and bowel sounds are normal. He exhibits no distension. There is no tenderness. There is no rigidity, no  rebound, no guarding, no CVA tenderness, no tenderness at McBurney's point and negative Murphy's sign.  Soft, NTND, +BS throughout, no r/g/r, neg murphy's, neg mcburney's, no CVA TTP   Musculoskeletal: Normal range of motion.  MAE x4 Strength and sensation grossly intact Distal pulses intact No pedal edema, neg homan's bilaterally B/l feet with callouses and obviously unkempt appearance, no focal tenderness, no erythema or warmth, no drainage, no open wounds  Neurological: He is alert and oriented to person, place, and time. He has normal strength. No sensory deficit.  Skin: Skin is warm, dry and intact. No rash noted.  See MSK  Psychiatric: He has a normal mood and affect.  Nursing note and vitals reviewed.   ED Course  Procedures (including critical care time) [place], ED  I-STAT CHEM 8, ED    Imaging Review Dg Abd Acute W/chest  09/16/2015  CLINICAL DATA:  Shortness of breath and chronic constipation. Question fecal impaction. Initial encounter. EXAM: DG ABDOMEN ACUTE W/ 1V CHEST COMPARISON:  Single view of the abdomen 02/10/2015. PA and lateral chest 04/29/2015. FINDINGS: Single view of the chest demonstrates clear lungs and normal heart size. No pneumothorax or pleural effusion. Two views of the abdomen show no free intraperitoneal air. A moderately large volume of stool is present throughout the colon. No evidence of fecal impaction is identified. The bowel gas pattern is nonobstructive. No abnormal abdominal calcification or focal bony abnormality is identified. IMPRESSION: Negative for evidence of fecal impaction with a moderately large stool burden throughout the colon seen. No acute finding. Electronically Signed   By: Inge Rise M.D.   On: 09/16/2015 10:47   Echo 04/24/15: Study Conclusions - Left ventricle: The cavity size was normal. Wall thickness was increased in a pattern of mild LVH. The estimated ejection fraction was 65%. Wall motion was normal; there were no regional wall motion abnormalities. - Aortic valve: Mild central AI. The leaflets are not seen well enough to say if two or three cusps. There is no AS. - Aorta: There is marked dilitation of the ascending aorta. The root measures 74mm. Sino-tubular junction 80mm. Ascending aorta 35mm. - Right atrium: The atrium was mildly dilated. - Impressions: MARKED DILITATION OF THE ASCENDING AORTA. THIS HAS PROGRESSED SINCE 2010.  Impressions: - MARKED DILITATION OF THE ASCENDING AORTA. THIS HAS PROGRESSED SINCE 2010.  I have personally reviewed and evaluated these images and lab results as part of my medical decision-making.  REPEAT EKG:  EKG Interpretation   Date/Time:  Wednesday September 16 2015 12:25:37 EST Ventricular Rate:  52 PR  Interval:  385 QRS Duration: 81 QT Interval:  437 QTC Calculation: 406 R Axis:   72 Text Interpretation:  Sinus rhythm Prolonged PR interval Anteroseptal  infarct, age indeterminate No significant change since last tracing  Confirmed by FLOYD MD, DANIEL [phone]) on 09/16/2015 12:56:03 PM      EKG Interpretation (INITIAL EKG) Date/Time:  Wednesday September 16 2015 10:54:16 EST Ventricular Rate:  53 PR Interval:  262 QRS Duration: 101 QT Interval:  432 QTC Calculation: 406 R Axis:   72 Text Interpretation:  Sinus rhythm Prolonged PR interval Anteroseptal  infarct, age indeterminate st elevation in inferior leads.  Seen in prior  ecg inverted t waves in I, avL also seen in prior Confirmed by FLOYD MD,  DANIEL [phone]) on 09/16/2015 11:00:11 AM       MDM   Final diagnoses:  Chronic constipation  SOB (shortness of breath)  Essential hypertension  Foot pain, bilateral    55 y.o. male here with multiple complaints, very convoluted unclear story about what has brought him in. Initially he told EMS that he felt short of breath, then told nursing that this had resolved but that he was here for his chronic constipation and chronic bilateral feet pain, he then goes on about multiple other chronic issues but does not give clear explanations on anything specific. He tells me that he doesn't feel as short of breath as he did when he called EMS, and states that it's because he was taken out of where he was, but doesn't tell me anything else about this. On exam, watching TV the entire time, no focal abdominal tenderness, clear lungs, no hypoxia or tachycardia, no LE swelling, doubt PE/DVT. B/l feet with callouses but no erythema/warmth/drainage, no focal tenderness, all extremities NVI with soft compartments. Chart review reveals that he's had similar complaints in the past. Ultimately when I offered him pain medication, he stated he just wanted food and doesn't care about anything for pain. Will get  basic labs, trop, EKG, and acute abd series to eval for any acute findings, but overall I feel this is mostly a social issue vs actually having an acute emergent condition. Of note, he had an echo in 04/24/15 which showed dilation of ascending aorta which had progressed since 2010. Will reassess shortly.   12:51 PM Labs unremarkable, trop neg. Acute abd series with moderate stool burden but no fecal impaction. No acute findings in the chest. EKG slightly similar from prior EKGs but has some nonspecific ST changes inferiorly and TWI in I/aVL which have been seen previously. Lead placement was sub-optimal so repeat EKG was done after lead placement was improved, which showed improvement of these findings and no concerning acute changes. I feel his complaints are mostly chronic and doubt need for other acute management at this time. Discussed use of miralax daily for his constipation, as well as increased water and fiber intake. F/up with [place] in 1wk for ongoing management of his chronic issues. Discussed tylenol/motrin for pain, and heat soaks to his feet. My attending Dr. Tyrone Nine saw pt as well and agrees with plan. I explained the diagnosis and have given explicit precautions to return to the ER including for any other new or worsening symptoms. The patient understands and accepts the medical plan as it's been dictated and I have answered their questions. Discharge instructions concerning home care and prescriptions have been given. The patient is STABLE and is discharged to home in good condition.  BP 158/112 mmHg  Pulse 58  Temp(Src) 98.3 F (36.8 C) (Oral)  Resp 21  SpO2 100%  Meds ordered this encounter  Medications  . polyethylene glycol (MIRALAX / GLYCOLAX) packet    Sig: Take 17 g by mouth daily.    Dispense:  14 each    Refill:  0    Order Specific Question:  Supervising Provider    Answer:  Noemi Chapel [[id number]]     Tienna Bienkowski Camprubi-Soms, PA-C 09/16/15 [place], DO 09/16/15  1259

## 2015-10-02 ENCOUNTER — Other Ambulatory Visit: Payer: Self-pay | Admitting: *Deleted

## 2015-10-02 ENCOUNTER — Encounter (HOSPITAL_COMMUNITY): Payer: Self-pay | Admitting: Emergency Medicine

## 2015-10-02 ENCOUNTER — Emergency Department (HOSPITAL_COMMUNITY): Payer: Self-pay

## 2015-10-02 ENCOUNTER — Emergency Department (HOSPITAL_COMMUNITY)
Admission: EM | Admit: 2015-10-02 | Discharge: 2015-10-02 | Disposition: A | Discharge status: Home / Self Care | Payer: Self-pay | Attending: Emergency Medicine | Admitting: Emergency Medicine

## 2015-10-02 DIAGNOSIS — R109 Unspecified abdominal pain: Secondary | ICD-10-CM

## 2015-10-02 DIAGNOSIS — R1030 Lower abdominal pain, unspecified: Secondary | ICD-10-CM | POA: Insufficient documentation

## 2015-10-02 DIAGNOSIS — E119 Type 2 diabetes mellitus without complications: Secondary | ICD-10-CM | POA: Insufficient documentation

## 2015-10-02 DIAGNOSIS — R634 Abnormal weight loss: Secondary | ICD-10-CM | POA: Insufficient documentation

## 2015-10-02 DIAGNOSIS — I77810 Thoracic aortic ectasia: Secondary | ICD-10-CM

## 2015-10-02 DIAGNOSIS — R5383 Other fatigue: Secondary | ICD-10-CM | POA: Insufficient documentation

## 2015-10-02 DIAGNOSIS — Z87448 Personal history of other diseases of urinary system: Secondary | ICD-10-CM | POA: Insufficient documentation

## 2015-10-02 DIAGNOSIS — F17210 Nicotine dependence, cigarettes, uncomplicated: Secondary | ICD-10-CM | POA: Insufficient documentation

## 2015-10-02 DIAGNOSIS — K5900 Constipation, unspecified: Secondary | ICD-10-CM | POA: Insufficient documentation

## 2015-10-02 DIAGNOSIS — R630 Anorexia: Secondary | ICD-10-CM | POA: Insufficient documentation

## 2015-10-02 DIAGNOSIS — I10 Essential (primary) hypertension: Secondary | ICD-10-CM | POA: Insufficient documentation

## 2015-10-02 DIAGNOSIS — Z79899 Other long term (current) drug therapy: Secondary | ICD-10-CM | POA: Insufficient documentation

## 2015-10-02 DIAGNOSIS — Z7982 Long term (current) use of aspirin: Secondary | ICD-10-CM | POA: Insufficient documentation

## 2015-10-02 LAB — CBC
HEMATOCRIT: 44.5 % (ref 39.0–52.0)
HEMOGLOBIN: 14.8 g/dL (ref 13.0–17.0)
MCH: 29.9 pg (ref 26.0–34.0)
MCHC: 33.3 g/dL (ref 30.0–36.0)
MCV: 89.9 fL (ref 78.0–100.0)
Platelets: 140 10*3/uL — ABNORMAL LOW (ref 150–400)
RBC: 4.95 MIL/uL (ref 4.22–5.81)
RDW: 12.5 % (ref 11.5–15.5)
WBC: 2.5 10*3/uL — ABNORMAL LOW (ref 4.0–10.5)

## 2015-10-02 LAB — COMPREHENSIVE METABOLIC PANEL
ALBUMIN: 4 g/dL (ref 3.5–5.0)
ALK PHOS: 75 U/L (ref 38–126)
ALT: 13 U/L — AB (ref 17–63)
AST: 13 U/L — ABNORMAL LOW (ref 15–41)
Anion gap: 8 (ref 5–15)
BILIRUBIN TOTAL: 0.7 mg/dL (ref 0.3–1.2)
BUN: 9 mg/dL (ref 6–20)
CHLORIDE: 104 mmol/L (ref 101–111)
CO2: 27 mmol/L (ref 22–32)
Calcium: 9 mg/dL (ref 8.9–10.3)
Creatinine, Ser: 0.98 mg/dL (ref 0.61–1.24)
GFR calc Af Amer: >60 mL/min (ref >60)
GFR calc non Af Amer: >60 mL/min (ref >60)
Glucose, Bld: 94 mg/dL (ref 65–99)
POTASSIUM: 3.9 mmol/L (ref 3.5–5.1)
Sodium: 139 mmol/L (ref 135–145)
Total Protein: 6.9 g/dL (ref 6.5–8.1)

## 2015-10-02 LAB — LIPASE, BLOOD: LIPASE: 22 U/L (ref 11–51)

## 2015-10-02 MED ORDER — SODIUM CHLORIDE 0.9 % IV BOLUS (SEPSIS)
1000.0000 mL | Freq: Once | INTRAVENOUS | Status: AC
  Administered 2015-10-02: 1000 mL via INTRAVENOUS

## 2015-10-02 MED ORDER — IOHEXOL 300 MG/ML  SOLN
100.0000 mL | Freq: Once | INTRAMUSCULAR | Status: AC | PRN
  Administered 2015-10-02: 100 mL via INTRAVENOUS

## 2015-10-02 NOTE — ED Notes (Signed)
MD at bedside. 

## 2015-10-02 NOTE — ED Provider Notes (Signed)
CSN: [id number]     Arrival date & time 10/02/15  [id number] History   First MD Initiated Contact with Patient 10/02/15 [phone]     Chief Complaint  Patient presents with  . Fatigue  . Constipation    HPI Patient presents the emergency department with complaints of generalized fatigue and anorexia.  He states he's had 100 pound weight loss over the past 12 months.  He is not have a primary care physician.  He reports that he also has had difficulty with sleep over the past several months.  He denies cough or congestion.  No shortness of breath.  Denies fevers and chills.  No dysuria or urinary frequency.  There is a family history of colon cancer.  He has never had a colonoscopy.  He reports she's had some constipation over the past several weeks as well.  Denies rectal pain.   Past Medical History  Diagnosis Date  . Diabetes mellitus without complication ([place])   . Hypertension   . Renal disorder     pt stated that masses were found on his kidneys   Past Surgical History  Procedure Laterality Date  . Knee surgery      bilateral   Family History  Problem Relation Age of Onset  . Diabetes Mellitus II Mother   . Hypertension Mother   . Diabetes Mellitus II Sister   . Hypertension Sister    Social History  Substance Use Topics  . Smoking status: Current Every Day Smoker -- 0.25 packs/day for 20 years    Types: Cigarettes  . Smokeless tobacco: Never Used  . Alcohol Use: No    Review of Systems  All other systems reviewed and are negative.     Allergies  Lisinopril  Home Medications   Prior to Admission medications   Medication Sig Start Date End Date Taking? Authorizing Provider  aspirin EC 81 MG tablet Take 81 mg by mouth daily.   Yes Historical Provider, MD  hydrALAZINE (APRESOLINE) 25 MG tablet Take 1 tablet (25 mg total) by mouth every 8 (eight) hours. 04/27/15  Yes Orson Eva, MD  polyethylene glycol (MIRALAX / GLYCOLAX) packet Take 17 g by mouth daily. Patient taking  differently: Take 17 g by mouth daily as needed for moderate constipation.  09/16/15  Yes Mercedes Camprubi-Soms, PA-C  simvastatin (ZOCOR) 10 MG tablet Take 10 mg by mouth daily.   Yes Historical Provider, MD  vitamin B-12 500 MCG tablet Take 1 tablet (500 mcg total) by mouth daily. 04/27/15  Yes Orson Eva, MD  feeding supplement, ENSURE ENLIVE, (ENSURE ENLIVE) LIQD Take 237 mLs by mouth 2 (two) times daily between meals. Patient not taking: Reported on 10/02/2015 04/27/15   Orson Eva, MD  meclizine (ANTIVERT) 25 MG tablet Take 1 tablet (25 mg total) by mouth 3 (three) times daily as needed for dizziness. Patient not taking: Reported on 10/02/2015 04/29/15   Evelina Bucy, MD  sitaGLIPtin (JANUVIA) 50 MG tablet Take 1 tablet (50 mg total) by mouth daily. Patient not taking: Reported on 04/29/2015 04/27/15   Orson Eva, MD   BP 197/125 mmHg  Pulse 50  Temp(Src) 98.3 F (36.8 C) (Oral)  Resp 16  SpO2 100% Physical Exam  Constitutional: He is oriented to person, place, and time. He appears well-developed and well-nourished.  HENT:  Head: Normocephalic and atraumatic.  Eyes: EOM are normal.  Neck: Normal range of motion.  Cardiovascular: Normal rate, regular rhythm, normal heart sounds and intact distal pulses.   Pulmonary/Chest:  Effort normal and breath sounds normal. No respiratory distress.  Abdominal: Soft. He exhibits no distension.  Mild lower abdominal tenderness without guarding or rebound.  No masses palpated.  Musculoskeletal: Normal range of motion.  Neurological: He is alert and oriented to person, place, and time.  Skin: Skin is warm and dry.  Psychiatric: He has a normal mood and affect. Judgment normal.  Nursing note and vitals reviewed.   ED Course  Procedures (including critical care time) Labs Review Labs Reviewed  CBC - Abnormal; Notable for the following:    WBC 2.5 (*)    Platelets 140 (*)    All other components within normal limits  COMPREHENSIVE METABOLIC PANEL -  Abnormal; Notable for the following:    AST 13 (*)    ALT 13 (*)    All other components within normal limits  LIPASE, BLOOD    Imaging Review Ct Abdomen Pelvis W Contrast  10/02/2015  CLINICAL DATA:  Weakness, wt loss, constipation   Decreased appetite EXAM: CT ABDOMEN AND PELVIS WITH CONTRAST TECHNIQUE: Multidetector CT imaging of the abdomen and pelvis was performed using the standard protocol following bolus administration of intravenous contrast. CONTRAST:  174mL OMNIPAQUE IOHEXOL 300 MG/ML  SOLN COMPARISON:  Radiographs 09/16/2015 and earlier studies FINDINGS: Lower chest: Minimal dependent atelectasis posteriorly in the visualized lung bases. Hepatobiliary: 3.9 cm subcapsular lesion in segment 2 near the dome which shows peripheral contrast puddling on portal venous phase imaging, stable since 04/24/2015. No other focal hepatic lesion or intrahepatic bile duct dilatation. Gallbladder unremarkable. Pancreas: No mass, inflammatory changes, or other significant abnormality. Spleen: Within normal limits in size and appearance. Adrenals/Urinary Tract: 3 cm mixed attenuation right adrenal mass with macroscopic fat, stable. 13 mm low-attenuation left adrenal nodule, stable. Bilateral renal cysts, largest from the left lower pole 5.7 cm. largest on the right in the upper pole 46mm diameter. No solid renal mass or hydronephrosis. No nephrolithiasis evident. Ureters decompressed. Urinary bladder physiologically distended. Stomach/Bowel: No evidence of obstruction, inflammatory process, or abnormal fluid collections. Moderate colonic fecal material. Normal appendix. Vascular/Lymphatic: Fusiform infrarenal aorta 3.2 cm maximum transverse diameter. Patchy aortoiliac arterial plaque without stenosis. Ectatic bilateral iliac arteries common iliac arteries, right 16 mm diameter, left 17 mm. Portal vein patent. Subcentimeter left para-aortic lymph nodes. No mesenteric or pelvic adenopathy. Reproductive: Mild  prostatic enlargement. Other: No ascites.  No free air. Musculoskeletal:  No suspicious bone lesions identified. IMPRESSION: 1. No acute abdominal process. 2. 3.2 cm infrarenal abdominal aortic aneurysm and dilated common iliac arteries. Recommend followup by ultrasound in 3 years. This recommendation follows ACR consensus guidelines: White Paper of the ACR Incidental Findings Committee II on Vascular Findings. J Am Coll Radiol 2013; [id number] 3. Stable benign-appearing bilateral adrenal and left hepatic lesions as above. Electronically Signed   By: Lucrezia Europe M.D.   On: 10/02/2015 11:57   I have personally reviewed and evaluated these images and lab results as part of my medical decision-making.   EKG Interpretation None      MDM   Final diagnoses:  Abdominal pain, unspecified abdominal location  Weight loss  Other fatigue    Patient will need a primary care physician.  He understands that he will still need a colonoscopy to further evaluate his constipation specimen given his family history of colon cancer and his weight loss.  I informed him of his abdominal aortic aneurysm as well.  He will need follow-up with this.  I've asked that he develop a relationship with  a primary care physician.  She's been given contact information for assistance with finding him a provider.  He understands to return to the ER for new or worsening symptoms    Jola Schmidt, MD 10/02/15 1257

## 2015-10-02 NOTE — Discharge Instructions (Signed)
[employer] 211 is a great source of information about community services available.  Access by dialing 2-1-1 from anywhere in [place], or by website -  CustodianSupply.fi.   Other Local Resources (Updated 08/2015)  [place]    Phone Number and Address  [place] medical care - 1st and 3rd Saturday of every month  Must not qualify for public or private insurance and must have limited income [phone] [address], [place]  Child care  Emergency assistance for housing and [employer]  Medicaid [phone] [address], [place] [address]   [facility] Department  Low-cost medical care for children, communicable diseases, sexually-transmitted diseases, immunizations, maternity care, womens health and family planning [phone] [address], [place] [address]  [facility]   Medication assistance for [facility] [place] residents  Must meet income requirements [phone] [place], [place].    [place]  Child care  Emergency assistance for housing and [employer]  Medicaid [phone] [address] [place], [place] [address]  [facility] [place]   Low-cost medical care,   Monday through Friday, 9 am to 6 pm.   Accepts Medicare/Medicaid, and self-pay [phone] [address] [place], [place] [address]  [facility] [place] care - Monday through Friday, 8:30 am - 5:30 pm  Accepts Medicaid and self-pay [phone] [address] [place], [place] [address]   [facility]  Primary medical care, including for those with sickle cell disease  Accepts Medicare,  Medicaid, insurance and self-pay [phone] [place], [place]  [facility]   Primary medical care  Accepts Medicare, [place], insurance and self-pay [phone] [address] [place], [place] [address]   [facility] Department of Social Services  Child care  Emergency assistance for housing and [employer]  Medicaid [phone] [address] [place], [place] [address]  [place] Department of Health and [employer]  Child care  Emergency assistance for housing and [employer]  Medicaid [phone] [place], [place] [address]   [facility] Medication Assistance Program  Medication assistance for [facility] residents with no insurance only  Must have a primary care doctor [phone] [address], [place], [place]  [facility]   Primary medical care  [place], [place], insurance  [phone] [address] [place], [place]  [facility]   Medication assistance [phone]  [facility]   Primary medical care  Accepts Medicare, [place], insurance and self-pay [phone] [address], [place] [address]  [facility]   Primary medical care  Accepts Medicare, [place], insurance and self-pay [phone] [address] [place] [place], [place] [address]  [facility]  For [place] residents between the ages of 29 and 49 who do not have any form of health insurance, Medicare, [place], or [place] benefits.  Services are provided free of charge to uninsured patients who fall within federal poverty guidelines.    Hours: Tuesdays and Thursdays, 4:15 - 8 pm [phone] [address] [place], [place] [address]  [facility]     Primary medical care  Dental  care  Nutritional counseling  Pharmacy  Accepts Medicaid, Medicare,  most insurance.  Fees are adjusted based on ability to pay.   [place], [place] [address] [place], [place], [place], [place], [place] [facility] [place], [place]  [facility]  Womens health and family planning [phone] [place]. [place], [place] care  Emergency assistance for housing and [employer]  Medicaid [phone] [address] [place], [place] [address]   [facility]    Ages 70 and older  Hours: Mondays and Thursdays, 7:00 am - 9:00 am Patients are seen on a first come, first served basis. [phone], ext. [place], [place]  Child care  Emergency assistance for housing and [employer]  Medicaid [id number] [address], [place] [address]  [facility] [place]  Medication assistance  Rental assistance  Food pantry  Medication assistance  Housing assistance  Emergency food distribution  Utility assistance [place], [place]  [address] [place]. [place], [place] [address] Hours: Tuesdays and Thursdays from 9am - 12 noon by appointment only  [place], [place] [address]  [facility] private insurance, [place], and [place].  Payment is based on a sliding scale for those without insurance.  Hours: Mondays, Tuesdays and Thursdays, 8:30 am - 5:30 pm.   [phone] [place], [place]  [facility] - Family Medicine at  [facility], [place], and [place].  Payment is based on a sliding scale for those without insurance. [phone] [address] [place], [place]  [facility] - Pediatrics at [address], research (medical), [employer], and [place].  Payment is based on a sliding scale for those without insurance [phone] [address] [place], [employer], [place]  [facility] - Pediatrics at [employer], [place], and [place].  Payment is based on a sliding scale for those without insurance. [phone] [place], [place]  [facility] - Pediatrics at [facility], [place], and [place].  Payment is based on a sliding scale for those without insurance. [phone], ext. [phone] [place], [place].    [place] care.  Accepts Medicaid and self-pay. [place], [place]

## 2015-10-02 NOTE — ED Notes (Signed)
55 yo male with [facility] with c/o weakness, constipation and decreased appetite. Ongoing symptoms for 1 year. 180/120 HX HTN no meds CBG 98. A/o x4

## 2015-10-02 NOTE — ED Notes (Signed)
Bed: [id number] Expected date:  Expected time:  Means of arrival:  Comments: EMS- 55yo M, weakness/constipation

## 2015-10-02 NOTE — ED Notes (Signed)
Patient transported to CT 

## 2015-10-02 NOTE — ED Notes (Signed)
Oral contrast complete 

## 2015-10-17 ENCOUNTER — Inpatient Hospital Stay (HOSPITAL_COMMUNITY): Payer: Self-pay

## 2015-10-17 ENCOUNTER — Emergency Department (HOSPITAL_COMMUNITY): Payer: Self-pay

## 2015-10-17 ENCOUNTER — Inpatient Hospital Stay (HOSPITAL_COMMUNITY)
Admission: EM | Admit: 2015-10-17 | Discharge: 2015-10-20 | DRG: 064 | Disposition: A | Discharge status: Skilled Nursing Facility | Payer: Self-pay | Attending: Internal Medicine | Admitting: Internal Medicine

## 2015-10-17 ENCOUNTER — Encounter (HOSPITAL_COMMUNITY): Payer: Self-pay | Admitting: Emergency Medicine

## 2015-10-17 DIAGNOSIS — E860 Dehydration: Secondary | ICD-10-CM | POA: Diagnosis present

## 2015-10-17 DIAGNOSIS — F329 Major depressive disorder, single episode, unspecified: Secondary | ICD-10-CM | POA: Diagnosis present

## 2015-10-17 DIAGNOSIS — G8191 Hemiplegia, unspecified affecting right dominant side: Secondary | ICD-10-CM | POA: Diagnosis present

## 2015-10-17 DIAGNOSIS — Z590 Homelessness: Secondary | ICD-10-CM

## 2015-10-17 DIAGNOSIS — I519 Heart disease, unspecified: Secondary | ICD-10-CM | POA: Diagnosis present

## 2015-10-17 DIAGNOSIS — E43 Unspecified severe protein-calorie malnutrition: Secondary | ICD-10-CM | POA: Diagnosis present

## 2015-10-17 DIAGNOSIS — I441 Atrioventricular block, second degree: Secondary | ICD-10-CM | POA: Diagnosis present

## 2015-10-17 DIAGNOSIS — R531 Weakness: Secondary | ICD-10-CM | POA: Diagnosis present

## 2015-10-17 DIAGNOSIS — R29810 Facial weakness: Secondary | ICD-10-CM | POA: Diagnosis present

## 2015-10-17 DIAGNOSIS — I10 Essential (primary) hypertension: Secondary | ICD-10-CM | POA: Diagnosis present

## 2015-10-17 DIAGNOSIS — K5909 Other constipation: Secondary | ICD-10-CM | POA: Diagnosis present

## 2015-10-17 DIAGNOSIS — R1310 Dysphagia, unspecified: Secondary | ICD-10-CM | POA: Diagnosis present

## 2015-10-17 DIAGNOSIS — Z7984 Long term (current) use of oral hypoglycemic drugs: Secondary | ICD-10-CM

## 2015-10-17 DIAGNOSIS — Z681 Body mass index (BMI) 19 or less, adult: Secondary | ICD-10-CM

## 2015-10-17 DIAGNOSIS — E1142 Type 2 diabetes mellitus with diabetic polyneuropathy: Secondary | ICD-10-CM | POA: Diagnosis present

## 2015-10-17 DIAGNOSIS — F17210 Nicotine dependence, cigarettes, uncomplicated: Secondary | ICD-10-CM | POA: Diagnosis present

## 2015-10-17 DIAGNOSIS — I639 Cerebral infarction, unspecified: Principal | ICD-10-CM | POA: Diagnosis present

## 2015-10-17 DIAGNOSIS — I739 Peripheral vascular disease, unspecified: Secondary | ICD-10-CM | POA: Diagnosis present

## 2015-10-17 DIAGNOSIS — H539 Unspecified visual disturbance: Secondary | ICD-10-CM | POA: Diagnosis present

## 2015-10-17 DIAGNOSIS — F419 Anxiety disorder, unspecified: Secondary | ICD-10-CM | POA: Diagnosis present

## 2015-10-17 DIAGNOSIS — I77810 Thoracic aortic ectasia: Secondary | ICD-10-CM | POA: Diagnosis present

## 2015-10-17 DIAGNOSIS — I160 Hypertensive urgency: Secondary | ICD-10-CM | POA: Diagnosis present

## 2015-10-17 DIAGNOSIS — R29706 NIHSS score 6: Secondary | ICD-10-CM | POA: Diagnosis present

## 2015-10-17 DIAGNOSIS — E785 Hyperlipidemia, unspecified: Secondary | ICD-10-CM | POA: Diagnosis present

## 2015-10-17 DIAGNOSIS — M6289 Other specified disorders of muscle: Secondary | ICD-10-CM

## 2015-10-17 DIAGNOSIS — E119 Type 2 diabetes mellitus without complications: Secondary | ICD-10-CM

## 2015-10-17 DIAGNOSIS — Z720 Tobacco use: Secondary | ICD-10-CM | POA: Diagnosis present

## 2015-10-17 DIAGNOSIS — Z888 Allergy status to other drugs, medicaments and biological substances status: Secondary | ICD-10-CM

## 2015-10-17 DIAGNOSIS — Z8249 Family history of ischemic heart disease and other diseases of the circulatory system: Secondary | ICD-10-CM

## 2015-10-17 DIAGNOSIS — Z833 Family history of diabetes mellitus: Secondary | ICD-10-CM

## 2015-10-17 HISTORY — DX: Disorder of kidney and ureter, unspecified: N28.9

## 2015-10-17 LAB — COMPREHENSIVE METABOLIC PANEL
ALK PHOS: 70 U/L (ref 38–126)
ALT: 17 U/L (ref 17–63)
ANION GAP: 12 (ref 5–15)
AST: 19 U/L (ref 15–41)
Albumin: 4.3 g/dL (ref 3.5–5.0)
BILIRUBIN TOTAL: 1.4 mg/dL — AB (ref 0.3–1.2)
BUN: 27 mg/dL — ABNORMAL HIGH (ref 6–20)
CALCIUM: 9.1 mg/dL (ref 8.9–10.3)
CO2: 25 mmol/L (ref 22–32)
Chloride: 104 mmol/L (ref 101–111)
Creatinine, Ser: 1.16 mg/dL (ref 0.61–1.24)
Glucose, Bld: 234 mg/dL — ABNORMAL HIGH (ref 65–99)
Potassium: 4.4 mmol/L (ref 3.5–5.1)
SODIUM: 141 mmol/L (ref 135–145)
TOTAL PROTEIN: 7.7 g/dL (ref 6.5–8.1)

## 2015-10-17 LAB — CBG MONITORING, ED
GLUCOSE-CAPILLARY: 203 mg/dL — AB (ref 65–99)
GLUCOSE-CAPILLARY: 134 mg/dL — AB (ref 65–99)
Glucose-Capillary: 69 mg/dL (ref 65–99)

## 2015-10-17 LAB — DIFFERENTIAL
Basophils Absolute: 0 10*3/uL (ref 0.0–0.1)
Basophils Relative: 0 %
EOS PCT: 0 %
Eosinophils Absolute: 0 10*3/uL (ref 0.0–0.7)
LYMPHS PCT: 21 %
Lymphs Abs: 0.8 10*3/uL (ref 0.7–4.0)
MONO ABS: 0.1 10*3/uL (ref 0.1–1.0)
Monocytes Relative: 3 %
Neutro Abs: 3 10*3/uL (ref 1.7–7.7)
Neutrophils Relative %: 76 %

## 2015-10-17 LAB — I-STAT CHEM 8, ED
BUN: 31 mg/dL — ABNORMAL HIGH (ref 6–20)
CALCIUM ION: 1.18 mmol/L (ref 1.12–1.23)
Chloride: 103 mmol/L (ref 101–111)
Creatinine, Ser: 1.1 mg/dL (ref 0.61–1.24)
GLUCOSE: 225 mg/dL — AB (ref 65–99)
HCT: 54 % — ABNORMAL HIGH (ref 39.0–52.0)
HEMOGLOBIN: 18.4 g/dL — AB (ref 13.0–17.0)
Potassium: 4.3 mmol/L (ref 3.5–5.1)
Sodium: 143 mmol/L (ref 135–145)
TCO2: 25 mmol/L (ref 0–100)

## 2015-10-17 LAB — PROTIME-INR
INR: 1.09 (ref 0.00–1.49)
PROTHROMBIN TIME: 14.3 s (ref 11.6–15.2)

## 2015-10-17 LAB — CBC
HCT: 50.6 % (ref 39.0–52.0)
Hemoglobin: 17.4 g/dL — ABNORMAL HIGH (ref 13.0–17.0)
MCH: 30.3 pg (ref 26.0–34.0)
MCHC: 34.4 g/dL (ref 30.0–36.0)
MCV: 88 fL (ref 78.0–100.0)
PLATELETS: 172 10*3/uL (ref 150–400)
RBC: 5.75 MIL/uL (ref 4.22–5.81)
RDW: 12.7 % (ref 11.5–15.5)
WBC: 3.9 10*3/uL — ABNORMAL LOW (ref 4.0–10.5)

## 2015-10-17 LAB — APTT: aPTT: 33 seconds (ref 24–37)

## 2015-10-17 LAB — I-STAT TROPONIN, ED: TROPONIN I, POC: 0 ng/mL (ref 0.00–0.08)

## 2015-10-17 MED ORDER — STROKE: EARLY STAGES OF RECOVERY BOOK
Freq: Once | Status: AC
  Administered 2015-10-19: 20:00:00
  Filled 2015-10-17: qty 1

## 2015-10-17 MED ORDER — SENNOSIDES-DOCUSATE SODIUM 8.6-50 MG PO TABS: 1.0000 | ORAL_TABLET | Freq: Every day | ORAL | Status: DC

## 2015-10-17 MED ORDER — SODIUM CHLORIDE 0.9 % IV SOLN
INTRAVENOUS | Status: DC
  Administered 2015-10-18: 01:00:00 via INTRAVENOUS

## 2015-10-17 MED ORDER — SODIUM CHLORIDE 0.9 % IV BOLUS (SEPSIS)
2000.0000 mL | Freq: Once | INTRAVENOUS | Status: AC
  Administered 2015-10-17: 2000 mL via INTRAVENOUS

## 2015-10-17 MED ORDER — AMLODIPINE BESYLATE 10 MG PO TABS
10.0000 mg | ORAL_TABLET | Freq: Every day | ORAL | Status: DC
  Administered 2015-10-17: 10 mg via ORAL
  Filled 2015-10-17: qty 1

## 2015-10-17 MED ORDER — ENOXAPARIN SODIUM 40 MG/0.4ML ~~LOC~~ SOLN
40.0000 mg | SUBCUTANEOUS | Status: DC
  Administered 2015-10-17 – 2015-10-19 (×3): 40 mg via SUBCUTANEOUS
  Filled 2015-10-17 (×3): qty 0.4

## 2015-10-17 MED ORDER — INSULIN ASPART 100 UNIT/ML ~~LOC~~ SOLN
0.0000 [IU] | Freq: Three times a day (TID) | SUBCUTANEOUS | Status: DC
  Administered 2015-10-18: 3 [IU] via SUBCUTANEOUS
  Administered 2015-10-18: 2 [IU] via SUBCUTANEOUS

## 2015-10-17 MED ORDER — INSULIN ASPART 100 UNIT/ML ~~LOC~~ SOLN
0.0000 [IU] | Freq: Every day | SUBCUTANEOUS | Status: DC
Start: 2015-10-17 — End: 2015-10-20

## 2015-10-17 MED ORDER — LORAZEPAM 2 MG/ML IJ SOLN
1.0000 mg | Freq: Once | INTRAMUSCULAR | Status: AC
  Administered 2015-10-17: 1 mg via INTRAVENOUS
  Filled 2015-10-17: qty 1

## 2015-10-17 NOTE — ED Provider Notes (Signed)
CSN: [id number]     Arrival date & time 10/17/15  1304 History   First MD Initiated Contact with Patient 10/17/15 1311     Chief Complaint  Patient presents with  . Numbness     (Consider location/radiation/quality/duration/timing/severity/associated sxs/prior Treatment) HPI Zyree Wernimont is a 55 y.o. male with a history of homelessness, diabetes, hypertension, comes in for evaluation of numbness and weakness. Patient is accompanied by his sister reports patient is currently homeless and stays under a bridge on [address] She is from [place] and states she last saw him normal 2 weeks ago. She reports this past Sunday she saw him and he was limping on his right leg that apparently has worsened throughout the week. Patient apparently has a right-sided facial droop that is new to sister and she has not seen this. Patient does report right-sided arm and leg weakness as well as some difficulty swallowing. Patient denies any other chest pain or shortness of breath, abdominal pain, N/V, urinary sx.  Past Medical History  Diagnosis Date  . Diabetes mellitus without complication ([place])   . Hypertension   . Renal disorder     pt stated that masses were found on his kidneys   Past Surgical History  Procedure Laterality Date  . Knee surgery      bilateral   Family History  Problem Relation Age of Onset  . Diabetes Mellitus II Mother   . Hypertension Mother   . Diabetes Mellitus II Sister   . Hypertension Sister    Social History  Substance Use Topics  . Smoking status: Current Every Day Smoker -- 0.25 packs/day for 20 years    Types: Cigarettes  . Smokeless tobacco: Never Used  . Alcohol Use: No    Review of Systems A 10 point review of systems was completed and was negative except for pertinent positives and negatives as mentioned in the history of present illness     Allergies  Clonidine derivatives and Lisinopril  Home Medications   Prior to Admission medications    Medication Sig Start Date End Date Taking? Authorizing Provider  feeding supplement, ENSURE ENLIVE, (ENSURE ENLIVE) LIQD Take 237 mLs by mouth 2 (two) times daily between meals. Patient not taking: Reported on 10/02/2015 04/27/15   Orson Eva, MD  hydrALAZINE (APRESOLINE) 25 MG tablet Take 1 tablet (25 mg total) by mouth every 8 (eight) hours. Patient not taking: Reported on 10/17/2015 04/27/15   Orson Eva, MD  meclizine (ANTIVERT) 25 MG tablet Take 1 tablet (25 mg total) by mouth 3 (three) times daily as needed for dizziness. Patient not taking: Reported on 10/02/2015 04/29/15   Evelina Bucy, MD  polyethylene glycol [facility] [place]) packet Take 17 g by mouth daily. Patient not taking: Reported on 10/17/2015 09/16/15   Mercedes Camprubi-Soms, PA-C  sitaGLIPtin (JANUVIA) 50 MG tablet Take 1 tablet (50 mg total) by mouth daily. Patient not taking: Reported on 04/29/2015 04/27/15   Orson Eva, MD  vitamin B-12 500 MCG tablet Take 1 tablet (500 mcg total) by mouth daily. Patient not taking: Reported on 10/17/2015 04/27/15   Orson Eva, MD   BP 157/114 mmHg  Pulse 69  Temp(Src) 97.6 F (36.4 C) (Oral)  Resp 15  Ht 6\' 9"  (2.057 m)  Wt 81.647 kg  BMI 19.30 kg/m2  SpO2 99% Physical Exam  Constitutional: He is oriented to person, place, and time. He appears well-developed and well-nourished.  HENT:  Head: Normocephalic and atraumatic.  Mouth/Throat: Oropharynx is clear and moist.  Right-sided facial droop.  Eyes: Conjunctivae are normal. Pupils are equal, round, and reactive to light. Right eye exhibits no discharge. Left eye exhibits no discharge. No scleral icterus.  Neck: Neck supple.  Cardiovascular: Normal rate, regular rhythm and normal heart sounds.   Pulmonary/Chest: Effort normal and breath sounds normal. No respiratory distress. He has no wheezes. He has no rales.  Abdominal: Soft. There is no tenderness.  Musculoskeletal: He exhibits no tenderness.  Neurological: He is alert and oriented  to person, place, and time.  Right-sided facial droop with forehead sparing. Right upper extremity weakness  Skin: Skin is warm and dry. No rash noted.  Psychiatric: He has a normal mood and affect.  Nursing note and vitals reviewed.   ED Course  Procedures (including critical care time) Labs Review Labs Reviewed  CBC - Abnormal; Notable for the following:    WBC 3.9 (*)    Hemoglobin 17.4 (*)    All other components within normal limits  COMPREHENSIVE METABOLIC PANEL - Abnormal; Notable for the following:    Glucose, Bld 234 (*)    BUN 27 (*)    Total Bilirubin 1.4 (*)    All other components within normal limits  I-STAT CHEM 8, ED - Abnormal; Notable for the following:    BUN 31 (*)    Glucose, Bld 225 (*)    Hemoglobin 18.4 (*)    HCT 54.0 (*)    All other components within normal limits  PROTIME-INR  APTT  DIFFERENTIAL  I-STAT TROPOININ, ED  CBG MONITORING, ED    Imaging Review Ct Head Wo Contrast  10/17/2015  CLINICAL DATA:  55 year old male with right-sided numbness/ weakness EXAM: CT HEAD WITHOUT CONTRAST TECHNIQUE: Contiguous axial images were obtained from the base of the skull through the vertex without intravenous contrast. COMPARISON:  Prior brain MRI 04/25/2015 FINDINGS: Negative for acute intracranial hemorrhage, acute infarction, mass, mass effect, hydrocephalus or midline shift. Gray-white differentiation is preserved throughout. Similar pattern of advanced periventricular, subcortical and deep white matter hypoattenuation which remains nonspecific but most consistent with chronic microvascular ischemic white matter disease. No focal soft tissue or calvarial abnormality. Normal aeration of mastoid air cells and visualized paranasal sinuses. IMPRESSION: 1. No acute intracranial abnormality. 2. 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 Electronically Signed   By: Jacqulynn Cadet M.D.   On: 10/17/2015 14:14   I have personally  reviewed and evaluated these images and lab results as part of my medical decision-making.   EKG Interpretation None     Meds given in ED:  Medications - No data to display  New Prescriptions   No medications on file   Filed Vitals:   10/17/15 1432 10/17/15 1449 10/17/15 1500 10/17/15 1558  BP: 154/115 162/102 146/100 157/114  Pulse: 64 62 67 69  Temp:  97.4 F (36.3 C)  97.6 F (36.4 C)  TempSrc:  Oral  Oral  Resp: 15 13 13 15   Height:      Weight:      SpO2: 100% 98% 97% 99%    MDM  Jamaine Lahner is a 55 y.o. male With a history of homelessness, hypertension, diabetes, comes in for evaluation of numbness and weakness. Patient is homeless, but sister who lives in [place] reports she last saw him normal 2 weeks ago but since that time has had worsening right-sided weakness and a new right-sided facial droop. Physical exam is consistent with these findings. CT scan of head is negative for any  acute intracranial abnormalities. Screening labs are unremarkable. However, patient will need medical admission for further stroke evaluation. Discussed with neurology, Dr. Nicole Kindred Request [facility] at [facility]   Discussed with Dr. Wynelle Cleveland, patient admitted to [facility] at [facility]   Final diagnoses:  Right sided weakness        Comer Locket, PA-C 10/17/15 1653  Lacretia Leigh, MD 10/18/15 1510

## 2015-10-17 NOTE — H&P (Addendum)
[facility] History and Physical  Raymar Alioto [id number] DOB: 12/11/1960 DOA: 10/17/2015   PCP: No primary care provider on file.    Chief Complaint: Left-sided weakness  HPI: Javier Rivera is a 55 y.o. male with a history of diabetes mellitus, hypertension, second-degree AV block, ascending aortic aneurysm, diastolic heart failure (per chart). He had right arm and leg weakness and numbness for little over a week now. He has not seen a PCP in over a year and does not take any medications.  He is homeless and his sister noted that he appeared to be dragging his leg last Sunday when she saw him. She states that she went back to check on him under the bridge where he lives and noted that he was in the exact same spot that she left them in a week ago. The patient tried to stand up with her assistance but was not able to walk. The patient states that he is been laying in the same spot and has not eaten or drank in a week. In addition to above-mentioned neurological symptoms, he states he also has right-sided visual disturbance which she is not able to explain and thinks he has had swallowing difficulties. His sister notes a right facial droop as well.    General: The patient denies anorexia, fever, + weight loss is past week Cardiac: Denies chest pain, syncope, palpitations, pedal edema  Respiratory: Denies cough, shortness of breath, wheezing GI: Denies severe indigestion/heartburn, abdominal pain, nausea, vomiting, diarrhea + constipation GU: Denies hematuria, incontinence, dysuria - urine output has decreased dramatically the past couple of days Musculoskeletal: Pain in his left knee-states that it needs to be replaced Skin: Denies suspicious skin lesions Neurologic: Per history of present illness Psychiatry: + depression & anxiety. Hematologic: no easy bruising or bleeding  All other systems reviewed and found to be negative.  Past Medical History  Diagnosis Date  . Diabetes  mellitus without complication ([place])   . Hypertension   . Renal disorder     pt stated that masses were found on his kidneys    Past Surgical History  Procedure Laterality Date  . Knee surgery      bilateral    Social History: smokes cigarettes occasionally, does not drink alcohol, no drug use In the past year, in the past he has used marijuana and cocaine Lives under a bridge    Allergies  Allergen Reactions  . Clonidine Derivatives     Pt reported the sweats and shakes  . Lisinopril Palpitations    "made me real sick, made my heart rate scatter"    Family history:   Family History  Problem Relation Age of Onset  . Diabetes Mellitus II Mother   . Hypertension Mother   . Diabetes Mellitus II Sister   . Hypertension Sister       Prior to Admission medications    Physical Exam: Filed Vitals:   10/17/15 1449 10/17/15 1500 10/17/15 1558 10/17/15 1658  BP: 162/102 146/100 157/114 144/95  Pulse: 62 67 69 68  Temp: 97.4 F (36.3 C)  97.6 F (36.4 C)   TempSrc: Oral  Oral   Resp: 13 13 15 18   Height:      Weight:      SpO2: 98% 97% 99% 96%     General: Alert oriented 3, no acute distress HEENT: Normocephalic and Atraumatic, Mucous membranes pink-oral mucosa dry                PERRLA; EOM  intact; No scleral icterus,                 Nares: Patent, Oropharynx: Clear, Fair Dentition                 Neck: FROM, no cervical lymphadenopathy, thyromegaly, carotid bruit or JVD;  Breasts: deferred CHEST WALL: No tenderness  CHEST: Normal respiration, clear to auscultation bilaterally  HEART: Regular rate and rhythm; no murmurs rubs or gallops  BACK: No kyphosis or scoliosis; no CVA tenderness  GI: Positive Bowel Sounds, soft, non-tender; no masses, no organomegaly Rectal Exam: deferred MSK: No cyanosis, clubbing, or edema Genitalia: not examined  SKIN:  no rash or ulceration  CNS: Alert and Oriented x 4, left facial droop, left arm and leg 4 out of 5 weakness,  decreased sensation on left arm and leg and face-I don't notice any slurred speech but family states that his speech is different from baseline  Labs on Admission:  Basic Metabolic Panel:  Recent Labs Lab 10/17/15 1330 10/17/15 1341  NA 141 143  K 4.4 4.3  CL 104 103  CO2 25  --   GLUCOSE 234* 225*  BUN 27* 31*  CREATININE 1.16 1.10  CALCIUM 9.1  --    Liver Function Tests:  Recent Labs Lab 10/17/15 1330  AST 19  ALT 17  ALKPHOS 70  BILITOT 1.4*  PROT 7.7  ALBUMIN 4.3   No results for input(s): LIPASE, AMYLASE in the last 168 hours. No results for input(s): AMMONIA in the last 168 hours. CBC:  Recent Labs Lab 10/17/15 1330 10/17/15 1341  WBC 3.9*  --   NEUTROABS 3.0  --   HGB 17.4* 18.4*  HCT 50.6 54.0*  MCV 88.0  --   PLT 172  --    Cardiac Enzymes: No results for input(s): CKTOTAL, CKMB, CKMBINDEX, TROPONINI in the last 168 hours.  BNP (last 3 results) No results for input(s): BNP in the last 8760 hours.  ProBNP (last 3 results) No results for input(s): PROBNP in the last 8760 hours.  CBG: No results for input(s): GLUCAP in the last 168 hours.  Radiological Exams on Admission: Ct Head Wo Contrast  10/17/2015  CLINICAL DATA:  55 year old male with right-sided numbness/ weakness EXAM: CT HEAD WITHOUT CONTRAST TECHNIQUE: Contiguous axial images were obtained from the base of the skull through the vertex without intravenous contrast. COMPARISON:  Prior brain MRI 04/25/2015 FINDINGS: Negative for acute intracranial hemorrhage, acute infarction, mass, mass effect, hydrocephalus or midline shift. Gray-white differentiation is preserved throughout. Similar pattern of advanced periventricular, subcortical and deep white matter hypoattenuation which remains nonspecific but most consistent with chronic microvascular ischemic white matter disease. No focal soft tissue or calvarial abnormality. Normal aeration of mastoid air cells and visualized paranasal sinuses.  IMPRESSION: 1. No acute intracranial abnormality. 2. 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 Electronically Signed   By: Jacqulynn Cadet M.D.   On: 10/17/2015 14:14    EKG: Independently reviewed. Normal sinus rhythm at 60 bpm  Assessment/Plan Principal Problem:   Right sided weakness -Weakness and numbness of right face, arm, leg with complaints of dysphagia and right visual disturbance -CT of the head is negative -Ordered an MRI - ER has consulted neurology and has spoken with Dr. Nicole Kindred -Transfer put [facility] to [facility] -Follow up stroke workup -May have had A. fib as last echo showed atrial dilation  Active Problems: Severe dehydration/ Protein-calorie malnutrition, severe -Due to no oral intake in  a week -Start normal saline follow I&O -Start diet if he passes swallow eval  Elevated hemoglobin -Likely hemoconcentrated    DIASTOLIC DYSFUNCTION? - Mentioned in epic however last echo did not show diastolic heart failure-atrium was dilated which could be suggestive of underlying diastolic heart failure    Tobacco abuse -Smokes occasionally and has not smoked in over a week    AV block, 2nd degree -Noted on chart-current EKG does not show this    Ascending aorta dilatation  -Close to 8 cm dilatation - last checked on echo in September 2016    DM type 2, goal A1c below 7 -Carb modified diet-A1c-sliding scale insulin    Benign essential HTN -Since his stroke symptoms started over a week ago we'll go ahead and manage his blood pressure- will hold off on starting an ACE inhibitor as he may end up having ATN due to underlying dehydration-we'll start Norvasc for now  Prior history of drug use -Check UDS mainly to ensure no cocaine in his system which could've caused his CVA  Consulted: Neurology  Code Status: Full code  Family Communication: Brother and sister at bedside  DVT Prophylaxis: Lovenox  Time spent: 72  minutes  [place], MD [facility]  If 7PM-7AM, please contact night-coverage [email or web address] 10/17/2015, 5:38 PM

## 2015-10-17 NOTE — ED Notes (Signed)
Pt transported to MRI 

## 2015-10-17 NOTE — ED Notes (Addendum)
Pt transported to CT. Swallow screen to be completed with pt return.

## 2015-10-17 NOTE — ED Notes (Signed)
Pt complaint of numbness/weakness to right side. Sister reports seeing pt with limb 3/5; pt reports "thinks I had a stroke Saturday [3/4]."

## 2015-10-17 NOTE — ED Notes (Signed)
PA notified of pt inability to hold right limbs for total assessment time. Previous assessment pt was able to hold limb for total assessment time.

## 2015-10-17 NOTE — ED Notes (Signed)
Bed: WA18 Expected date:  Expected time:  Means of arrival:  Comments: Pt still in room 

## 2015-10-17 NOTE — ED Notes (Signed)
Per xray tech, pt has orders for MRI. The MRI technician is being called in to do this. Pt has reported anxiety about this, and may need medication to facilitate procedure

## 2015-10-18 ENCOUNTER — Encounter (HOSPITAL_COMMUNITY): Payer: Self-pay | Admitting: *Deleted

## 2015-10-18 ENCOUNTER — Inpatient Hospital Stay (HOSPITAL_COMMUNITY): Payer: Self-pay

## 2015-10-18 DIAGNOSIS — I6302 Cerebral infarction due to thrombosis of basilar artery: Secondary | ICD-10-CM

## 2015-10-18 DIAGNOSIS — I6330 Cerebral infarction due to thrombosis of unspecified cerebral artery: Secondary | ICD-10-CM

## 2015-10-18 LAB — RAPID URINE DRUG SCREEN, HOSP PERFORMED
AMPHETAMINES: NOT DETECTED
BENZODIAZEPINES: NOT DETECTED
Barbiturates: NOT DETECTED
COCAINE: NOT DETECTED
OPIATES: NOT DETECTED
TETRAHYDROCANNABINOL: NOT DETECTED

## 2015-10-18 LAB — GLUCOSE, CAPILLARY
GLUCOSE-CAPILLARY: 90 mg/dL (ref 65–99)
Glucose-Capillary: 129 mg/dL — ABNORMAL HIGH (ref 65–99)
Glucose-Capillary: 153 mg/dL — ABNORMAL HIGH (ref 65–99)
Glucose-Capillary: 94 mg/dL (ref 65–99)

## 2015-10-18 LAB — VITAMIN B12: VITAMIN B 12: 474 pg/mL (ref 180–914)

## 2015-10-18 MED ORDER — SENNA 8.6 MG PO TABS
1.0000 | ORAL_TABLET | Freq: Every day | ORAL | Status: DC
  Administered 2015-10-18 – 2015-10-19 (×2): 8.6 mg via ORAL
  Filled 2015-10-18 (×2): qty 1

## 2015-10-18 MED ORDER — IOHEXOL 350 MG/ML SOLN: 80.0000 mL | Freq: Once | INTRAVENOUS | Status: DC | PRN

## 2015-10-18 MED ORDER — ASPIRIN EC 325 MG PO TBEC
325.0000 mg | DELAYED_RELEASE_TABLET | Freq: Every day | ORAL | Status: DC
  Administered 2015-10-18 – 2015-10-20 (×3): 325 mg via ORAL
  Filled 2015-10-18 (×3): qty 1

## 2015-10-18 MED ORDER — CYANOCOBALAMIN 1000 MCG/ML IJ SOLN
1000.0000 ug | Freq: Once | INTRAMUSCULAR | Status: AC
  Administered 2015-10-18: 1000 ug via INTRAMUSCULAR
  Filled 2015-10-18: qty 1

## 2015-10-18 MED ORDER — ASPIRIN 300 MG RE SUPP: 300.0000 mg | Freq: Every day | RECTAL | Status: DC

## 2015-10-18 NOTE — Evaluation (Signed)
Physical Therapy Evaluation Patient Details Name: Javier Rivera MRN: [id number] DOB: [birth year] Today's Date: 10/18/2015   History of Present Illness  55 y.o. male with a history of diabetes mellitus, hypertension, second-degree AV block, ascending aortic aneurysm, diastolic heart failure. MRI reveals Subacute left pontine infarct.  Clinical Impression  Pt admitted with above diagnosis. Pt currently with functional limitations due to the deficits listed below (see PT Problem List). Demonstrates functional strength inconsistent with manual muscle testing (poor effort.) Appears to have Rt knee instability/ decreased coordination and Rt footdrop with gait, although tolerates full weight-bearing through RLE. Gait improved with UE support on RW but needs cues and supervision for safety. Unhappy bout lack of amount of meat in his lunch today. Pt will benefit from skilled PT to increase their independence and safety with mobility to allow discharge to the venue listed below.       Follow Up Recommendations SNF    Equipment Recommendations  Rolling walker with 5" wheels    Recommendations for Other Services       Precautions / Restrictions Precautions Precautions: Fall Restrictions Weight Bearing Restrictions: No      Mobility  Bed Mobility Overal bed mobility: Needs Assistance Bed Mobility: Supine to Sit;Sit to Supine     Supine to sit: Supervision Sit to supine: Supervision   General bed mobility comments: requires extra time. cues for safety.  Transfers Overall transfer level: Needs assistance Equipment used: Rolling walker (2 wheeled) Transfers: Sit to/from Stand Sit to Stand: Min assist;From elevated surface         General transfer comment: Light assist for boost to stand from elevated bed surface. VC for hand placement. Encouraged use of RLE as tolerated.  Ambulation/Gait Ambulation/Gait assistance: Min guard Ambulation Distance (Feet): 25 Feet Assistive device:  Rolling walker (2 wheeled) Gait Pattern/deviations: Step-through pattern;Decreased stance time - right;Decreased stride length;Decreased dorsiflexion - right (Rapid Rt knee hyperextension in stance) Gait velocity: decreased Gait velocity interpretation: Below normal speed for age/gender General Gait Details: Min guard and verbal cues for safety. Instructions for walker use, pushing away at times to ambulate without device, demonstrating moderate Rt knee instability but able to fully weight-bear without buckling. Cues for Rt heel strike due to notable foot drop.  Stairs            Wheelchair Mobility    Modified Rankin (Stroke Patients Only) Modified Rankin (Stroke Patients Only) Pre-Morbid Rankin Score: No symptoms Modified Rankin: Moderately severe disability     Balance Overall balance assessment: Needs assistance Sitting-balance support: No upper extremity supported;Feet supported Sitting balance-Leahy Scale: Normal     Standing balance support: No upper extremity supported Standing balance-Leahy Scale: Fair                               Pertinent Vitals/Pain Pain Assessment: 0-10 Pain Score: 10-Worst pain ever Pain Location: RLE - "it hurts and it's numb" Pain Descriptors / Indicators:  ("Hurts and it's numb") Pain Intervention(s): Monitored during session;Repositioned    Home Living Family/patient expects to be discharged to:: Shelter/Homeless (hotel and under a bridge) Living Arrangements: Alone   Type of Home: Homeless (stays in a hotel and outside "both")         Home Equipment: None Additional Comments: States he stays at a hotel and outside under a bridge at times.    Prior Function Level of Independence: Independent  Hand Dominance   Dominant Hand: Right    Extremity/Trunk Assessment   Upper Extremity Assessment: Defer to OT evaluation           Lower Extremity Assessment: RLE deficits/detail RLE Deficits /  Details: Minimal effort during exam. Not consistent with ability to fully weight-bear in standing. Does appear to have some coordination difficult and Rt drop foot functionally. (Reports numbness R>L)       Communication   Communication: No difficulties  Cognition Arousal/Alertness: Awake/alert Behavior During Therapy: WFL for tasks assessed/performed Overall Cognitive Status: Within Functional Limits for tasks assessed                      General Comments General comments (skin integrity, edema, etc.): Encouraged to sit up out of bed, refuses.    Exercises        Assessment/Plan    PT Assessment Patient needs continued PT services  PT Diagnosis Difficulty walking;Abnormality of gait;Hemiplegia dominant side;Acute pain   PT Problem List Decreased strength;Decreased activity tolerance;Decreased balance;Decreased mobility;Decreased coordination;Decreased knowledge of use of DME;Decreased safety awareness;Impaired sensation;Pain  PT Treatment Interventions DME instruction;Gait training;Functional mobility training;Therapeutic activities;Therapeutic exercise;Balance training;Neuromuscular re-education;Patient/family education   PT Goals (Current goals can be found in the Care Plan section) Acute Rehab PT Goals Patient Stated Goal: "I'll think about it." PT Goal Formulation: With patient Time For Goal Achievement: 11/01/15 Potential to Achieve Goals: Fair    Frequency Min 4X/week   Barriers to discharge Inaccessible home environment;Decreased caregiver support homeless, alone    Co-evaluation               End of Session Equipment Utilized During Treatment: Gait belt Activity Tolerance: Patient tolerated treatment well Patient left: in bed;with call bell/phone within reach;with bed alarm set Nurse Communication: Mobility status         Time: 1351-1411 PT Time Calculation (min) (ACUTE ONLY): 20 min   Charges:   PT Evaluation $PT Eval Moderate  Complexity: 1 Procedure     PT G CodesEllouise Newer 10/18/2015, 3:16 PM Camille Bal Browning, [place]

## 2015-10-18 NOTE — Progress Notes (Signed)
Sai Licursi [id number] DOB: 06/11/1961 DOA: 10/17/2015 PCP: No primary care provider on file.  Brief narrative: 55 y/o homeless ? Known Mobitz ty 1 Aortic root dilatation Orthostatsis HTn urgency DM ty II HLD Tob Abuse ?Prior B 12 deficiency  Admitted with s s/ym of TIA   Past medical history-As per Problem list Chart reviewed as below-  Consultants:  Neurology  Procedures:  CTA   CT head  Antibiotics:  none   Subjective   alert pleasant oriented in nad tol diet  no cp no n/v Weak on RLE and RUE "I cannot walk"   Objective    Interim History:   Telemetry: sinus   Objective: Filed Vitals:   10/18/15 0001 10/18/15 0158 10/18/15 0400 10/18/15 0600  BP: 134/93 139/90 133/92 145/94  Pulse: 78 76 92 88  Temp: 98.6 F (37 C) 98.6 F (37 C) 98 F (36.7 C) 98 F (36.7 C)  TempSrc: Oral Oral Oral Oral  Resp: 20 20 20 18   Height: 6\' 9"  (2.057 m)     Weight: 84.46 kg (186 lb 3.2 oz)     SpO2: 98%  97% 97%   No intake or output data in the 24 hours ending 10/18/15 0728  Exam:  General: eomi ncat Cardiovascular: s1 s2 no m/r/g Respiratory: clear no added sound Abdomen: soft nt nd no reboudn Skin intact Neuro power diminished RLE-umb  Data Reviewed: Basic Metabolic Panel:  Recent Labs Lab 10/17/15 1330 10/17/15 1341  NA 141 143  K 4.4 4.3  CL 104 103  CO2 25  --   GLUCOSE 234* 225*  BUN 27* 31*  CREATININE 1.16 1.10  CALCIUM 9.1  --    Liver Function Tests:  Recent Labs Lab 10/17/15 1330  AST 19  ALT 17  ALKPHOS 70  BILITOT 1.4*  PROT 7.7  ALBUMIN 4.3   No results for input(s): LIPASE, AMYLASE in the last 168 hours. No results for input(s): AMMONIA in the last 168 hours. CBC:  Recent Labs Lab 10/17/15 1330 10/17/15 1341  WBC 3.9*  --   NEUTROABS 3.0  --   HGB 17.4* 18.4*  HCT 50.6 54.0*  MCV 88.0  --   PLT 172  --    Cardiac Enzymes: No results for input(s): CKTOTAL, CKMB, CKMBINDEX, TROPONINI in the  last 168 hours. BNP: Invalid input(s): POCBNP CBG:  Recent Labs Lab 10/17/15 1332 10/17/15 2155 10/17/15 2304 10/18/15 0627  GLUCAP 203* 69 134* 129*    No results found for this or any previous visit (from the past 240 hour(s)).   Studies:              All Imaging reviewed and is as per above notation   Scheduled Meds: .  stroke: mapping our early stages of recovery book   Does not apply Once  . amLODipine  10 mg Oral Daily  . enoxaparin (LOVENOX) injection  40 mg Subcutaneous Q24H  . insulin aspart  0-15 Units Subcutaneous TID WC  . insulin aspart  0-5 Units Subcutaneous QHS   Continuous Infusions: . sodium chloride 125 mL/hr at 10/18/15 0100     Assessment/Plan:  1. TIA?-further w/u per neuro.  Main risks are poor Htn and DM control-therapy to see as cannot move around much for 1 week-asa 325.  Await Echo 2. dVol depeltion-saline at 125 cc/hr/  bmet am 3. htn-cont amlodipine 1-0 daily 4. DM ty 11-start glipizide 4 on d/c if can tol diet 5. dilated aortic arch-needs Ct on  follow up 6. 2nd deg AVB stable 7. Neuropathy-rec b12 shot-given   Homeless-Sw consulted Needs T/OT    Verneita Griffes, MD  [facility] Pager [phone] 10/18/2015, 7:28 AM    LOS: 1 day

## 2015-10-18 NOTE — Progress Notes (Signed)
Pt arrived from [facility] E.D alert and oriented X3 admitting paged tele monitoring initiated.

## 2015-10-18 NOTE — Consult Note (Signed)
Neurology Consultation Reason for Consult: Stroke Referring Physician: Wynelle Cleveland, S  CC: Stroke  History is obtained from: Patient  HPI: Javier Rivera is a 55 y.o. male who is in his normal state of health up until a week ago. At that time, he had sudden onset right arm leg weakness and numbness. He has a history of diabetes and hypertension, but does not see physicians due to being homeless. His sister would check on him and found him unable to stand and therefore he was brought into the emergency room.   LKW: One week ago tpa given?: no, out of window Premorbid modified rankin scale: 0    ROS: A 14 point ROS was performed and is negative except as noted in the HPI.   Past Medical History  Diagnosis Date  . Diabetes mellitus without complication ([place])   . Hypertension   . Renal disorder     pt stated that masses were found on his kidneys     Family History  Problem Relation Age of Onset  . Diabetes Mellitus II Mother   . Hypertension Mother   . Diabetes Mellitus II Sister   . Hypertension Sister      Social History:  reports that he has been smoking Cigarettes.  He has a 5 pack-year smoking history. He has never used smokeless tobacco. He reports that he does not drink alcohol or use illicit drugs.   Exam: Current vital signs: BP 139/90 mmHg  Pulse 76  Temp(Src) 98.6 F (37 C) (Oral)  Resp 20  Ht 6\' 9"  (2.057 m)  Wt 84.46 kg (186 lb 3.2 oz)  BMI 19.96 kg/m2  SpO2 98% Vital signs in last 24 hours: Temp:  [97.3 F (36.3 C)-98.6 F (37 C)] 98.6 F (37 C) (03/12 0158) Pulse Rate:  [59-78] 76 (03/12 0158) Resp:  [13-20] 20 (03/12 0158) BP: (130-165)/(90-115) 139/90 mmHg (03/12 0158) SpO2:  [96 %-100 %] 98 % (03/12 0001) Weight:  [81.647 kg (180 lb)-84.46 kg (186 lb 3.2 oz)] 84.46 kg (186 lb 3.2 oz) (03/12 0001)   Physical Exam  Constitutional: Appears well-developed and well-nourished.  Psych: Affect appropriate to situation Eyes: No scleral  injection HENT: No OP obstrucion Head: Normocephalic.  Cardiovascular: Normal rate and regular rhythm.  Respiratory: Effort normal and breath sounds normal to anterior ascultation GI: Soft.  No distension. There is no tenderness.  Skin: WDI  Neuro: Mental Status: Patient is awake, alert, oriented to person, place, month, year, and situation. Patient is able to give a clear and coherent history. No signs of aphasia or neglect Cranial Nerves: II: Visual Fields are full. Pupils are equal, round, and reactive to light.   III,IV, VI: EOMI without ptosis or diploplia.  V: Facial sensation is symmetric to temperature VII: Facial movement is decreased on the right VIII: hearing is intact to voice X: Uvula deviates to the right XI: Shoulder shrug is decreased on the right.  XII: tongue is midline without atrophy or fasciculations.  Motor: Tone is normal. Bulk is normal. 5/5 strength was present in the left, he has 3/5 weakness of both the right arm and leg Sensory: Sensation is diminished in a stocking glove bilaterally Cerebellar: FNF intact on the left   I have reviewed labs in epic and the results pertinent to this consultation are: Elevated glucose  I have reviewed the images obtained: MRI brain-left pontine infarct  Impression: 55 year old male with infarct likely due to small vessel disease due to untreated diabetes and hypertension. He also  has a stocking glove decreased sensation concerning for diabetic neuropathy, however I will check a B-12 as well given his poor nutritional state.  Recommendations: 1. HgbA1c, fasting lipid panel 2. CT angiogram of the head and neck 3. Frequent neuro checks 4. Echocardiogram 5. Carotid dopplers are not needed given CTA 6. Prophylactic therapy-Antiplatelet med: Aspirin - dose 325mg  PO or 300mg  PR 7. Risk factor modification 8. Telemetry monitoring 9. PT consult, OT consult, Speech consult 10. please page stroke NP  Or  PA  Or MD  M-F  from 8am -4 pm starting 3/12 as this patient will be followed by the stroke team at this point.   You can look them up on [email or web address]  Password [email or web address]    Roland Rack, MD [facility] [phone]  If 7pm- 7am, please page neurology on call as listed in [place].

## 2015-10-18 NOTE — Progress Notes (Signed)
STROKE TEAM PROGRESS NOTE   HISTORY OF PRESENT ILLNESS Javier Rivera is a 55 y.o. male who is in his normal state of health up until a week ago. At that time, he had sudden onset right arm leg weakness and numbness. He has a history of diabetes and hypertension, but does not see physicians due to being homeless. His sister would check on him and found him unable to stand and therefore he was brought into the emergency room.  LKW: One week ago tpa given?: no, out of window Premorbid modified rankin scale: 0  SUBJECTIVE (INTERVAL HISTORY) No family members present. The patient denies alcohol or drug use currently, but still occasionally smoking. His last use of cocaine and THC was two years ago. Reports that he has had severe constipation for approximately two years. It was recommended that he follow-up with a primary care physician with regards to this issue.   OBJECTIVE Temp:  [97.5 F (36.4 C)-98.6 F (37 C)] 97.9 F (36.6 C) (03/12 1319) Pulse Rate:  [63-92] 70 (03/12 1319) Cardiac Rhythm:  [-] Normal sinus rhythm (03/12 0701) Resp:  [13-20] 20 (03/12 1319) BP: (130-148)/(90-108) 148/99 mmHg (03/12 1319) SpO2:  [96 %-100 %] 100 % (03/12 1319) Weight:  [84.46 kg (186 lb 3.2 oz)] 84.46 kg (186 lb 3.2 oz) (03/12 0001)  CBC:   Recent Labs Lab 10/17/15 1330 10/17/15 1341  WBC 3.9*  --   NEUTROABS 3.0  --   HGB 17.4* 18.4*  HCT 50.6 54.0*  MCV 88.0  --   PLT 172  --     Basic Metabolic Panel:   Recent Labs Lab 10/17/15 1330 10/17/15 1341  NA 141 143  K 4.4 4.3  CL 104 103  CO2 25  --   GLUCOSE 234* 225*  BUN 27* 31*  CREATININE 1.16 1.10  CALCIUM 9.1  --     Lipid Panel:     Component Value Date/Time   CHOL 153 04/27/2015 0348   TRIG 48 04/27/2015 0348   HDL 48 04/27/2015 0348   CHOLHDL 3.2 04/27/2015 0348   VLDL 10 04/27/2015 0348   LDLCALC 95 04/27/2015 0348   HgbA1c:  Lab Results  Component Value Date   HGBA1C 6.0* 04/24/2015   Urine Drug Screen:      Component Value Date/Time   LABOPIA NONE DETECTED 10/18/2015 0718   COCAINSCRNUR NONE DETECTED 10/18/2015 0718   LABBENZ NONE DETECTED 10/18/2015 0718   AMPHETMU NONE DETECTED 10/18/2015 0718   THCU NONE DETECTED 10/18/2015 0718   LABBARB NONE DETECTED 10/18/2015 0718      IMAGING I have personally reviewed the radiological images below and agree with the radiology interpretations.  Ct Angio Head W/cm &/or Wo Cm 10/18/2015   CTA NECK:  Minimal atherosclerosis without hemodynamically significant stenosis or acute vascular process. Poor dentition.  CTA HEAD:  No emergent large vessel occlusion or high-grade stenosis. Mild luminal irregularity of the mid to distal vessels compatible with atherosclerosis.   Ct Head Wo Contrast 10/17/2015   1. No acute intracranial abnormality.  2. 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   Mr Brain Wo Contrast 10/17/2015   1. Subacute left pontine infarct.  2. Moderate chronic small vessel ischemic disease.   TTE - pending   PHYSICAL EXAM  Temp:  [97.5 F (36.4 C)-98.6 F (37 C)] 98.3 F (36.8 C) (03/12 1723) Pulse Rate:  [63-92] 80 (03/12 1723) Resp:  [14-20] 20 (03/12 1723) BP: (130-148)/(90-108) 146/99 mmHg (03/12 1723)  SpO2:  [97 %-100 %] 97 % (03/12 1723) Weight:  [186 lb 3.2 oz (84.46 kg)] 186 lb 3.2 oz (84.46 kg) (03/12 0001)  General - Well nourished, well developed, in no apparent distress.  Ophthalmologic - Sharp disc margins OU.   Cardiovascular - Regular rate and rhythm with no murmur.  Mental Status -  Level of arousal and orientation to time, place, and person were intact. Language including expression, naming, repetition, comprehension was assessed and found intact. Fund of Knowledge was assessed and was intact.  Cranial Nerves II - XII - II - Visual field intact OU. III, IV, VI - Extraocular movements intact. V - right facial decreased light touch, 75% comparing with  left. VII - right facial droop. VIII - Hearing & vestibular intact bilaterally. X - Palate elevates symmetrically. XI - Chin turning & shoulder shrug intact bilaterally. XII - Tongue protrusion intact.  Motor Strength - The patient's strength was RUE 4/5 and RLE 3/5, LUE and LLE 5/5 and pronator drift was present on the right.  Bulk was normal and fasciculations were absent.   Motor Tone - Muscle tone was assessed at the neck and appendages and was normal.  Reflexes - The patient's reflexes were 1+ in all extremities and he had no pathological reflexes.  Sensory - Light touch, temperature/pinprick were assessed and were decreased on the right, 75% of the left.    Coordination - The patient had normal movements in the hands with no ataxia or dysmetria.  Tremor was absent.  Gait and Station - not tested due to safety concerns.   ASSESSMENT/PLAN Mr. Javier Rivera is a 55 y.o. male with history of diabetes mellitus, ascending aortic aneurysm, second-degree AV block, diastolic heart failure, hypertension, and renal insufficiency  presenting with sudden onset of right-sided weakness and numbness.  He did not receive IV t-PA due to late presentation.  Stroke:  Left pontine infarct felt secondary to small vessel disease.  Resultant  Right hemiparesis and hemiparesthesia   MRI  Subacute left pontine infarct.   CTA of head and neck - no large vessel stenosis  2D Echo - Pending  LDL - pending  HgbA1c - pending  HIV, RPR, UDS - pending  VTE prophylaxis - Lovenox Diet Heart Room service appropriate?: Yes; Fluid consistency:: Thin  No antithrombotic prior to admission, now on aspirin 325 mg daily. Continue ASA on discharge.  Patient counseled to be compliant with his antithrombotic medications  Ongoing aggressive stroke risk factor management  Therapy recommendations: SNF recommended  Disposition:  Pending  Hypertension  Stable  Permissive hypertension (OK if < 220/120) but  gradually normalize in 5-7 days  Hyperlipidemia  Home meds:  No lipid lowering medications prior to admission  LDL pending, goal < 70  Add statin if indicated  Continue statin at discharge  Diabetes 1. HgbA1c pending, goal < 7.0 2. Controlled 3. SSI 4. CBG monitoring  Tobacco abuse  Current smoker  Smoking cessation counseling provided  Pt is willing to quit  Other Stroke Risk Factors  Substance abuse - quit 2 years ago  ETOH use  Other Active Problems  Homeless  Elevated BUN  No regular medical care  Chronic constipation   Hospital day # 1  Rosalin Hawking, MD PhD Stroke Neurology 10/18/2015 6:11 PM   To contact Stroke Continuity provider, please refer to [email or web address] After hours, contact General Neurology

## 2015-10-19 ENCOUNTER — Inpatient Hospital Stay (HOSPITAL_COMMUNITY): Payer: Self-pay

## 2015-10-19 DIAGNOSIS — I6789 Other cerebrovascular disease: Secondary | ICD-10-CM

## 2015-10-19 LAB — LIPID PANEL
CHOLESTEROL: 164 mg/dL (ref 0–200)
HDL: 39 mg/dL — AB (ref >40)
LDL Cholesterol: 115 mg/dL — ABNORMAL HIGH (ref 0–99)
Total CHOL/HDL Ratio: 4.2 RATIO
Triglycerides: 52 mg/dL (ref <150)
VLDL: 10 mg/dL (ref 0–40)

## 2015-10-19 LAB — GLUCOSE, CAPILLARY
GLUCOSE-CAPILLARY: 94 mg/dL (ref 65–99)
GLUCOSE-CAPILLARY: 97 mg/dL (ref 65–99)
Glucose-Capillary: 120 mg/dL — ABNORMAL HIGH (ref 65–99)
Glucose-Capillary: 110 mg/dL — ABNORMAL HIGH (ref 65–99)

## 2015-10-19 LAB — BASIC METABOLIC PANEL
ANION GAP: 7 (ref 5–15)
BUN: 13 mg/dL (ref 6–20)
CALCIUM: 8.6 mg/dL — AB (ref 8.9–10.3)
CO2: 27 mmol/L (ref 22–32)
Chloride: 107 mmol/L (ref 101–111)
Creatinine, Ser: 1 mg/dL (ref 0.61–1.24)
GFR calc Af Amer: >60 mL/min (ref >60)
GLUCOSE: 115 mg/dL — AB (ref 65–99)
POTASSIUM: 3.8 mmol/L (ref 3.5–5.1)
SODIUM: 141 mmol/L (ref 135–145)

## 2015-10-19 LAB — ECHOCARDIOGRAM COMPLETE
HEIGHTINCHES: 81 in
WEIGHTICAEL: 2979.2 [oz_av]

## 2015-10-19 LAB — HIV ANTIBODY (ROUTINE TESTING W REFLEX): HIV SCREEN 4TH GENERATION: NONREACTIVE

## 2015-10-19 LAB — RPR: RPR: NONREACTIVE

## 2015-10-19 MED ORDER — POLYETHYLENE GLYCOL 3350 17 G PO PACK
17.0000 g | PACK | Freq: Every day | ORAL | Status: DC
  Administered 2015-10-19 – 2015-10-20 (×2): 17 g via ORAL
  Filled 2015-10-19 (×2): qty 1

## 2015-10-19 MED ORDER — SENNOSIDES-DOCUSATE SODIUM 8.6-50 MG PO TABS
2.0000 | ORAL_TABLET | Freq: Two times a day (BID) | ORAL | Status: DC
Start: 2015-10-19 — End: 2015-10-20
  Administered 2015-10-19 – 2015-10-20 (×2): 2 via ORAL
  Filled 2015-10-19 (×2): qty 2

## 2015-10-19 MED ORDER — ATORVASTATIN CALCIUM 40 MG PO TABS
40.0000 mg | ORAL_TABLET | Freq: Every day | ORAL | Status: DC
  Administered 2015-10-19: 40 mg via ORAL
  Filled 2015-10-19: qty 1

## 2015-10-19 NOTE — Progress Notes (Signed)
   10/19/15 1304  PT Visit Information  Last PT Received On 10/19/15  Reason Eval/Treat Not Completed Patient declined, no reason specified (Pt refused tx.  Reports to come back later.  " I don't feel like doing it.")

## 2015-10-19 NOTE — Care Management Note (Signed)
Case Management Note  Patient Details  Name: Javier Rivera MRN: [id number] Date of Birth: Oct 23, 1960  Subjective/Objective:                    Action/Plan: Patient was admitted with right sided weakness and numbness.  He is currently homeless and listed as self-pay. He is being followed by Langley Gauss in [employer].  CM will follow for discharge needs pending PT/OT evals and physician orders.  Expected Discharge Date:  10/20/15               Expected Discharge Plan:     In-House Referral:     Discharge planning Services    [phone] Post Acute Care Choice:    Choice offered to:     DME Arranged:    DME Agency:     HH Arranged:    HH Agency:     Status of Service:  In process, will continue to follow  Medicare Important Message Given:    Date Medicare IM Given:    Medicare IM give by:    Date Additional Medicare IM Given:    Additional Medicare Important Message give by:     If discussed at [place] of Stay Meetings, dates discussed:    Additional Comments:  Rolm Baptise, RN 10/19/2015, 3:48 PM

## 2015-10-19 NOTE — Progress Notes (Signed)
Echocardiogram 2D Echocardiogram has been performed.  Javier Rivera 10/19/2015, 12:41 PM

## 2015-10-19 NOTE — Evaluation (Signed)
Speech Language Pathology Evaluation Patient Details Name: Javier Rivera MRN: [id number] DOB: 10-Sep-1960 Today's Date: 10/19/2015 Time: 1510-1520 SLP Time Calculation (min) (ACUTE ONLY): 10 min  Problem List:  Patient Active Problem List   Diagnosis Date Noted  . Right sided weakness 10/17/2015  . DM type 2, goal A1c below 7 10/17/2015  . Benign essential HTN 10/17/2015  . CVA (cerebral infarction) 10/17/2015  . Sensory disturbance 04/25/2015  . Ascending aorta dilatation (HCC)   . Protein-calorie malnutrition, severe ([place]) 04/24/2015  . Heart block AV second degree 04/23/2015  . Dizziness 04/23/2015  . Diabetes mellitus type 2, controlled ([place]) 04/23/2015  . Essential hypertension 04/23/2015  . Tobacco abuse 04/23/2015  . AV block, 2nd degree   . SEBORRHEIC DERMATITIS 05/07/2010  . RECTAL BLEEDING 08/14/2009  . DIABETES MELLITUS, TYPE II, UNCONTROLLED 06/11/2009  . DYSLIPIDEMIA 06/11/2009  . COCAINE ABUSE 06/11/2009  . ESSENTIAL HYPERTENSION 06/11/2009  . DIASTOLIC DYSFUNCTION [id number]  . RENAL INSUFFICIENCY 06/11/2009   Past Medical History:  Past Medical History  Diagnosis Date  . Diabetes mellitus without complication ([place])   . Hypertension   . Renal disorder     pt stated that masses were found on his kidneys  . Renal insufficiency    Past Surgical History:  Past Surgical History  Procedure Laterality Date  . Knee surgery      bilateral   HPI:  55 y.o. male with history of diabetes mellitus, ascending aortic aneurysm, second-degree AV block, diastolic heart failure, hypertension, and renal insufficiency presenting with sudden onset of right-sided weakness and numbness. He did not receive IV t-PA due to late presentation.  MRI left pontine infarct.    Assessment / Plan / Recommendation Clinical Impression  Pt does not present with overt deficits in cognitive-communication, no dysarthria, but he speaks in low volume, appears apathetic, when giving hx  regarding his college degree states quietly "I don't want to talk about it." No issues requiring SLP intervention are warranted - will sign off.     SLP Assessment  Patient does not need any further Speech Lanaguage Pathology Services    Follow Up Recommendations  None    Frequency and Duration           SLP Evaluation Prior Functioning  Cognitive/Linguistic Baseline: Information not available Type of Home: Homeless Education: college degree "I don't want to talk about it"   Cognition  Overall Cognitive Status: No family/caregiver present to determine baseline cognitive functioning Arousal/Alertness: Awake/alert Orientation Level: Oriented to person;Oriented to place;Oriented to situation;Disoriented to time Attention: Focused Focused Attention: Appears intact Awareness: Appears intact    Comprehension  Auditory Comprehension Overall Auditory Comprehension: Appears within functional limits for tasks assessed Visual Recognition/Discrimination Discrimination: Not tested Reading Comprehension Reading Status: Not tested    Expression Expression Primary Mode of Expression: Verbal Verbal Expression Overall Verbal Expression: Appears within functional limits for tasks assessed Written Expression Dominant Hand: Right   Oral / Motor  Oral Motor/Sensory Function Overall Oral Motor/Sensory Function: Mild impairment Facial ROM: Reduced right Facial Symmetry: Abnormal symmetry right;Suspected CN VII (facial) dysfunction Motor Speech Overall Motor Speech: Appears within functional limits for tasks assessed   GO             Javier Rivera L. Tivis Ringer, [place] CCC/SLP Pager [phone]         Javier Rivera Laurice 10/19/2015, 3:33 PM

## 2015-10-19 NOTE — Progress Notes (Signed)
[facility] PROGRESS NOTE  Javier Rivera [id number] DOB: 02/02/1961 DOA: 10/17/2015 PCP: No primary care provider on file.  55 year old homeless male with Mobitz type I, aortic root dilatation, uncontrolled hypertension, type 2 diabetes mellitus, ongoing tobacco use, hyperlipidemia presented with acute onset of right arm and leg weakness with numbness. He reports that he has had poor by mouth intake for the past 1 week. He is not been able to see a doctor due to his homelessness status. When his sister checked on him she found that he was unable to stand and this brought to the ED. Patient did not get TPA as he was out of the therapeutic window. Workup done showed left pontine infarct.   Assessment/Plan: Acute/subacute left pontine infarct Risk factors include uncontrolled hypertension, diabetes mellitus, ongoing tobacco use and hyperlipidemia. MRI brain shows subacute left pontine infarct. CT angiogram of the head and neck shows no large vessel stenosis. 2-D echo shows EF of 55-60%.. LDL of 115. Continue statin. Patient started on full dose aspirin. Patient has residual mild right facial droop with diminished sensation and right hemiparesis. Seen by PT and recommends skilled nursing facility. Nutrition consult.   Uncontrolled hypertension Not on any medications. Allowing permissive blood pressure.  Hyperlipidemia LDL of 115. Added Lipitor.  Type 2 diabetes mellitus Check A1c. Monitor on sliding scale coverage.   tobacco abuse Counseled strongly on cessation. Nicotine patch.  Protein calorie malnutrition Nutrition consult  Constipation  MiraLAX, senna and Colace  Diet: Heart healthy  DVT prophylaxis: Subcutaneous Lovenox  Code Status: Full code Family Communication: None at bedside Disposition Plan: Skilled nursing facility   Consultants:  Neurology  Procedures:  MRI brain  2-D echo  CT angiogram of the  neck  Antibiotics:  None  HPI/Subjective: Seen and examined. Has flat affect. Reports being constipated for one week  Objective: Filed Vitals:   10/19/15 1034 10/19/15 1430  BP: 146/90 147/94  Pulse: 64 65  Temp: 98.1 F (36.7 C) 98.1 F (36.7 C)  Resp: 18 18    Intake/Output Summary (Last 24 hours) at 10/19/15 1535 Last data filed at 10/19/15 1511  Gross per 24 hour  Intake    480 ml  Output    950 ml  Net   -470 ml   Filed Weights   10/17/15 1319 10/18/15 0001  Weight: 81.647 kg (180 lb) 84.46 kg (186 lb 3.2 oz)    Exam:   General:  Middle aged male not in distress   HEENT: Moist mucosa   chest: Clear bilaterally  Cardiovascular: Normal S1 and S2, no murmurs rub or gallop  GI: Soft, nondistended, nontender  Musculoskeletal: On, no edema  CNS: Alert and oriented, mild right facial droop, 4/5 power in right upper extremity, 3/5 power in right leg  Data Reviewed: Basic Metabolic Panel:  Recent Labs Lab 10/17/15 1330 10/17/15 1341 10/19/15 0759  NA 141 143 141  K 4.4 4.3 3.8  CL 104 103 107  CO2 25  --  27  GLUCOSE 234* 225* 115*  BUN 27* 31* 13  CREATININE 1.16 1.10 1.00  CALCIUM 9.1  --  8.6*   Liver Function Tests:  Recent Labs Lab 10/17/15 1330  AST 19  ALT 17  ALKPHOS 70  BILITOT 1.4*  PROT 7.7  ALBUMIN 4.3   No results for input(s): LIPASE, AMYLASE in the last 168 hours. No results for input(s): AMMONIA in the last 168 hours. CBC:  Recent Labs Lab 10/17/15 1330 10/17/15 1341  WBC 3.9*  --  NEUTROABS 3.0  --   HGB 17.4* 18.4*  HCT 50.6 54.0*  MCV 88.0  --   PLT 172  --    Cardiac Enzymes: No results for input(s): CKTOTAL, CKMB, CKMBINDEX, TROPONINI in the last 168 hours. BNP (last 3 results) No results for input(s): BNP in the last 8760 hours.  ProBNP (last 3 results) No results for input(s): PROBNP in the last 8760 hours.  CBG:  Recent Labs Lab 10/18/15 1147 10/18/15 1650 10/18/15 2209 10/19/15 0626  10/19/15 1152  GLUCAP 90 153* 94 94 97    No results found for this or any previous visit (from the past 240 hour(s)).   Studies: Ct Angio Head W/cm &/or Wo Cm  10/18/2015  CLINICAL DATA:  Acute onset RIGHT extremity numbness and weakness. History of hypertension and diabetes. Currently homeless. EXAM: CT ANGIOGRAPHY HEAD AND NECK TECHNIQUE: Multidetector CT imaging of the head and neck was performed using the standard protocol during bolus administration of intravenous contrast. Multiplanar CT image reconstructions and MIPs were obtained to evaluate the vascular anatomy. Carotid stenosis measurements (when applicable) are obtained utilizing NASCET criteria, using the distal internal carotid diameter as the denominator. COMPARISON:  MRI of the head March 11th 2017 FINDINGS: CT HEAD No abnormal intracranial enhancement. CTA NECK Aortic arch: Normal appearance of the thoracic arch, normal branch pattern. The origins of the innominate, left Common carotid artery and subclavian artery are widely patent. Right carotid system: Common carotid artery is widely patent, coursing in a straight line fashion. Normal appearance of the carotid bifurcation without hemodynamically significant stenosis by NASCET criteria. Minimal eccentric calcific atherosclerosis. Normal appearance of the included internal carotid artery. Left carotid system: Common carotid artery is widely patent, coursing in a straight line fashion. Normal appearance of the carotid bifurcation without hemodynamically significant stenosis by NASCET criteria. Minimal eccentric calcific atherosclerosis. Normal appearance of the included internal carotid artery. Vertebral arteries:Left vertebral artery is dominant. Normal appearance of the vertebral arteries, which appear widely patent. Skeleton: No acute osseous process though bone windows have not been submitted. Moderate degenerative change of the cervical spine. Multiple dental caries, periapical  lucency/abscess. Other neck: Soft tissues of the neck are nonacute though, not tailored for evaluation. Small RIGHT laryngocele. CTA HEAD Anterior circulation: Normal appearance of the cervical internal carotid arteries, petrous, cavernous and supra clinoid internal carotid arteries. Widely patent anterior communicating artery. Normal appearance of the anterior and middle cerebral arteries. Posterior circulation: Normal appearance of the vertebral arteries, vertebrobasilar junction and basilar artery, as well as main branch vessels. Normal appearance of the posterior cerebral arteries. Mild luminal regularity of the mid to distal cerebral arteries. No large vessel occlusion, hemodynamically significant stenosis, dissection, contrast extravasation or aneurysm within the anterior nor posterior circulation. IMPRESSION: CTA NECK: Minimal atherosclerosis without hemodynamically significant stenosis or acute vascular process. Poor dentition. CTA HEAD: No emergent large vessel occlusion or high-grade stenosis. Mild luminal irregularity of the mid to distal vessels compatible with atherosclerosis. Electronically Signed   By: Elon Alas M.D.   On: 10/18/2015 06:19   Ct Angio Neck W/cm &/or Wo/cm  10/18/2015  CLINICAL DATA:  Acute onset RIGHT extremity numbness and weakness. History of hypertension and diabetes. Currently homeless. EXAM: CT ANGIOGRAPHY HEAD AND NECK TECHNIQUE: Multidetector CT imaging of the head and neck was performed using the standard protocol during bolus administration of intravenous contrast. Multiplanar CT image reconstructions and MIPs were obtained to evaluate the vascular anatomy. Carotid stenosis measurements (when applicable) are obtained utilizing NASCET criteria,  using the distal internal carotid diameter as the denominator. COMPARISON:  MRI of the head March 11th 2017 FINDINGS: CT HEAD No abnormal intracranial enhancement. CTA NECK Aortic arch: Normal appearance of the thoracic arch,  normal branch pattern. The origins of the innominate, left Common carotid artery and subclavian artery are widely patent. Right carotid system: Common carotid artery is widely patent, coursing in a straight line fashion. Normal appearance of the carotid bifurcation without hemodynamically significant stenosis by NASCET criteria. Minimal eccentric calcific atherosclerosis. Normal appearance of the included internal carotid artery. Left carotid system: Common carotid artery is widely patent, coursing in a straight line fashion. Normal appearance of the carotid bifurcation without hemodynamically significant stenosis by NASCET criteria. Minimal eccentric calcific atherosclerosis. Normal appearance of the included internal carotid artery. Vertebral arteries:Left vertebral artery is dominant. Normal appearance of the vertebral arteries, which appear widely patent. Skeleton: No acute osseous process though bone windows have not been submitted. Moderate degenerative change of the cervical spine. Multiple dental caries, periapical lucency/abscess. Other neck: Soft tissues of the neck are nonacute though, not tailored for evaluation. Small RIGHT laryngocele. CTA HEAD Anterior circulation: Normal appearance of the cervical internal carotid arteries, petrous, cavernous and supra clinoid internal carotid arteries. Widely patent anterior communicating artery. Normal appearance of the anterior and middle cerebral arteries. Posterior circulation: Normal appearance of the vertebral arteries, vertebrobasilar junction and basilar artery, as well as main branch vessels. Normal appearance of the posterior cerebral arteries. Mild luminal regularity of the mid to distal cerebral arteries. No large vessel occlusion, hemodynamically significant stenosis, dissection, contrast extravasation or aneurysm within the anterior nor posterior circulation. IMPRESSION: CTA NECK: Minimal atherosclerosis without hemodynamically significant stenosis or  acute vascular process. Poor dentition. CTA HEAD: No emergent large vessel occlusion or high-grade stenosis. Mild luminal irregularity of the mid to distal vessels compatible with atherosclerosis. Electronically Signed   By: Elon Alas M.D.   On: 10/18/2015 06:19   Mr Brain Wo Contrast  10/17/2015  CLINICAL DATA:  Right arm and leg weakness and numbness for greater than 1 week. History of hypertension and diabetes. EXAM: MRI HEAD WITHOUT CONTRAST TECHNIQUE: Multiplanar, multiecho pulse sequences of the brain and surrounding structures were obtained without intravenous contrast. COMPARISON:  Head CT earlier today and MRI 04/25/2015 FINDINGS: There is a linear focus of abnormal diffusion signal in the inferior left paramedian pons with normal ADC and new regional T2 hyperintensity, likely reflecting an early subacute infarct given the patient's symptom duration. There is no evidence of recent supratentorial infarct. There is no evidence of intracranial hemorrhage, mass, midline shift, or extra-axial fluid collection. Patchy T2 hyperintensities throughout the subcortical and deep cerebral white matter bilaterally are similar to the prior MRI and compatible with moderate chronic small vessel ischemic disease, greatly advanced for age. Orbits are unremarkable. Paranasal sinuses and mastoid air cells are clear. Major intracranial vascular flow voids are preserved. IMPRESSION: 1. Subacute left pontine infarct. 2. Moderate chronic small vessel ischemic disease. Electronically Signed   By: Logan Bores M.D.   On: 10/17/2015 19:23    Scheduled Meds: .  stroke: mapping our early stages of recovery book   Does not apply Once  . aspirin EC  325 mg Oral Daily   Or  . aspirin  300 mg Rectal Daily  . atorvastatin  40 mg Oral q1800  . enoxaparin (LOVENOX) injection  40 mg Subcutaneous Q24H  . insulin aspart  0-15 Units Subcutaneous TID WC  . insulin aspart  0-5 Units Subcutaneous  QHS  . polyethylene glycol  17  g Oral Daily  . senna  1 tablet Oral Daily   Continuous Infusions: . sodium chloride 125 mL/hr at 10/18/15 0100      Time spent: 25 minutes    Louellen Molder  [facility] Pager [phone] If 7PM-7AM, please contact night-coverage at [email or web address], password [facility] 10/19/2015, 3:35 PM  LOS: 2 days

## 2015-10-19 NOTE — Progress Notes (Signed)
STROKE TEAM PROGRESS NOTE   HISTORY OF PRESENT ILLNESS Javier Rivera is a 55 y.o. male who is in his normal state of health up until a week ago. At that time, he had sudden onset right arm leg weakness and numbness. He has a history of diabetes and hypertension, but does not see physicians due to being homeless. His sister would check on him and found him unable to stand and therefore he was brought into the emergency room.  LKW: One week ago tpa given?: no, out of window Premorbid modified rankin scale: 0  SUBJECTIVE (INTERVAL HISTORY) No family members present. The patient denies alcohol or drug use currently, but still occasionally smoking. His last use of cocaine and THC was two years ago. Reports that he has had severe constipation for approximately two years. It was recommended that he follow-up with a primary care physician with regards to this issue.   OBJECTIVE Temp:  [98.1 F (36.7 C)-98.4 F (36.9 C)] 98.1 F (36.7 C) (03/13 1034) Pulse Rate:  [58-80] 64 (03/13 1034) Cardiac Rhythm:  [-] Heart block (03/13 0700) Resp:  [18-20] 18 (03/13 1034) BP: (119-150)/(79-99) 146/90 mmHg (03/13 1034) SpO2:  [97 %-100 %] 100 % (03/13 1034)  CBC:   Recent Labs Lab 10/17/15 1330 10/17/15 1341  WBC 3.9*  --   NEUTROABS 3.0  --   HGB 17.4* 18.4*  HCT 50.6 54.0*  MCV 88.0  --   PLT 172  --     Basic Metabolic Panel:   Recent Labs Lab 10/17/15 1330 10/17/15 1341 10/19/15 0759  NA 141 143 141  K 4.4 4.3 3.8  CL 104 103 107  CO2 25  --  27  GLUCOSE 234* 225* 115*  BUN 27* 31* 13  CREATININE 1.16 1.10 1.00  CALCIUM 9.1  --  8.6*    Lipid Panel:     Component Value Date/Time   CHOL 164 10/19/2015 0759   TRIG 52 10/19/2015 0759   HDL 39* 10/19/2015 0759   CHOLHDL 4.2 10/19/2015 0759   VLDL 10 10/19/2015 0759   LDLCALC 115* 10/19/2015 0759   HgbA1c:  Lab Results  Component Value Date   HGBA1C 6.0* 04/24/2015   Urine Drug Screen:     Component Value Date/Time    LABOPIA NONE DETECTED 10/18/2015 0718   COCAINSCRNUR NONE DETECTED 10/18/2015 0718   LABBENZ NONE DETECTED 10/18/2015 0718   AMPHETMU NONE DETECTED 10/18/2015 0718   THCU NONE DETECTED 10/18/2015 0718   LABBARB NONE DETECTED 10/18/2015 0718      IMAGING I have personally reviewed the radiological images below and agree with the radiology interpretations.  Ct Angio Head W/cm &/or Wo Cm 10/18/2015   CTA NECK:  Minimal atherosclerosis without hemodynamically significant stenosis or acute vascular process. Poor dentition.  CTA HEAD:  No emergent large vessel occlusion or high-grade stenosis. Mild luminal irregularity of the mid to distal vessels compatible with atherosclerosis.   Ct Head Wo Contrast 10/17/2015   1. No acute intracranial abnormality.  2. 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   Mr Brain Wo Contrast 10/17/2015   1. Subacute left pontine infarct.  2. Moderate chronic small vessel ischemic disease.   TTE - - Left ventricle: The cavity size was normal. Systolic function was  normal. The estimated ejection fraction was in the range of 55%  to 60%. Wall motion was normal; there were no regional wall  motion abnormalities. - Aortic valve: There was mild regurgitation. - Aorta:  Consider repeat CT scan to measure aortic root (prior  04/2015). Aortic root dimension: 55 mm (ED). - Ascending aorta: The ascending aorta was severely dilated. Impressions: - No cardiac source of emboli was indentified.   PHYSICAL EXAM  Temp:  [98.1 F (36.7 C)-98.4 F (36.9 C)] 98.1 F (36.7 C) (03/13 1034) Pulse Rate:  [58-80] 64 (03/13 1034) Resp:  [18-20] 18 (03/13 1034) BP: (119-150)/(79-99) 146/90 mmHg (03/13 1034) SpO2:  [97 %-100 %] 100 % (03/13 1034)  General - Well nourished, well developed, in no apparent distress.  Ophthalmologic - Sharp disc margins OU.   Cardiovascular - Regular rate and rhythm with no murmur.  Mental  Status -  Level of arousal and orientation to time, place, and person were intact. Language including expression, naming, repetition, comprehension was assessed and found intact. Fund of Knowledge was assessed and was intact.  Cranial Nerves II - XII - II - Visual field intact OU. III, IV, VI - Extraocular movements intact. V - right facial decreased light touch, 75% comparing with left. VII - right facial droop. VIII - Hearing & vestibular intact bilaterally. X - Palate elevates symmetrically. XI - Chin turning & shoulder shrug intact bilaterally. XII - Tongue protrusion intact.  Motor Strength - The patient's strength was RUE 4/5 and RLE 3/5, LUE and LLE 5/5 and pronator drift was present on the right.  Bulk was normal and fasciculations were absent.   Motor Tone - Muscle tone was assessed at the neck and appendages and was normal.  Reflexes - The patient's reflexes were 1+ in all extremities and he had no pathological reflexes.  Sensory - Light touch, temperature/pinprick were assessed and were decreased on the right, 75% of the left.    Coordination - The patient had normal movements in the hands with no ataxia or dysmetria.  Tremor was absent.  Gait and Station - not tested due to safety concerns.   ASSESSMENT/PLAN Mr. Javier Rivera is a 55 y.o. male with history of diabetes mellitus, ascending aortic aneurysm, second-degree AV block, diastolic heart failure, hypertension, and renal insufficiency  presenting with sudden onset of right-sided weakness and numbness.  He did not receive IV t-PA due to late presentation.  Stroke:  Left pontine infarct felt secondary to small vessel disease.  Resultant  Right hemiparesis and hemiparesthesia   MRI  Subacute left pontine infarct.   CTA of head and neck - no large vessel stenosis  2D Echo - EF 55-60%  LDL - 115  HgbA1c - pending  HIV, RPR, UDS - negative  VTE prophylaxis - Lovenox Diet heart healthy/carb modified Room  service appropriate?: Yes; Fluid consistency:: Thin  No antithrombotic prior to admission, now on aspirin 325 mg daily. Continue ASA on discharge.  Patient counseled to be compliant with his antithrombotic medications  Ongoing aggressive stroke risk factor management  Therapy recommendations: SNF recommended  Disposition:  Pending  Hypertension  Stable  Permissive hypertension (OK if < 220/120) but gradually normalize in 5-7 days  Hyperlipidemia  Home meds:  No lipid lowering medications prior to admission  LDL 115, goal < 70  lipitor 40mg  added  Continue statin at discharge  Diabetes 1. HgbA1c pending, goal < 7.0 2. Controlled 3. SSI 4. CBG monitoring  Tobacco abuse  Current smoker  Smoking cessation counseling provided  Pt is willing to quit  Other Stroke Risk Factors  Substance abuse - quit 2 years ago - UDS this admission negative  ETOH use  Other Active Problems  Homeless  Elevated BUN  No regular medical care  Chronic constipation   Hospital day # 2  Neurology will sign off. Please call with questions. Pt will follow up with Cecille Rubin NP at [facility] in about 2 months. Thanks for the consult.  Rosalin Hawking, MD PhD Stroke Neurology 10/19/2015 2:20 PM   To contact Stroke Continuity provider, please refer to [email or web address] After hours, contact General Neurology

## 2015-10-19 NOTE — Progress Notes (Signed)
Initial Nutrition Assessment   INTERVENTION:  Provide Snacks TID Provide Ensure Enlive po BID, each supplement provides 350 kcal and 20 grams of protein   NUTRITION DIAGNOSIS:   Inadequate oral intake related to poor appetite as evidenced by per patient/family report, percent weight loss.   GOAL:   Patient will meet greater than or equal to 90% of their needs    MONITOR:   PO intake, Supplement acceptance, Weight trends, Labs, Skin, I & O's  REASON FOR ASSESSMENT:   Malnutrition Screening Tool    ASSESSMENT:   55 y.o. male with a history of diabetes mellitus, hypertension, second-degree AV block, ascending aortic aneurysm, diastolic heart failure (per chart). He had right arm and leg weakness and numbness for little over a week now. He has not seen a PCP in over a year and does not take any medications.   Bedside procedure in process at time of first visit and pt eating lunch at time of second visit. Pt reports having a decreased appetite recently. He denies food insecurity. He reports having difficulty swallowing with some foods such as pork. Per H&P, pt had not eaten or drank in a week PTA. Per nursing notes, pt is eating 100% of meals. He appears thin with moderate wasting noted in temples, clavicles and arms. Limited assessment due to pt eating. Per weight history, pt used to weigh 260-280 lbs several years ago, weighed 208 lbs 10 months ago and has maintained ~180 lbs the past 6 months. RD encouraged increasing PO intake; pt agreeable to receiving Ensure and snacks.   Labs reviewed.   Diet Order:  Diet heart healthy/carb modified Room service appropriate?: Yes; Fluid consistency:: Thin  Skin:  Reviewed, no issues  Last BM:  3/7  Height:   Ht Readings from Last 1 Encounters:  10/18/15 6\' 9"  (2.057 m)    Weight:   Wt Readings from Last 1 Encounters:  10/18/15 186 lb 3.2 oz (84.46 kg)    Ideal Body Weight:  105.5 kg  BMI:  Body mass index is 19.96  kg/(m^2).  Estimated Nutritional Needs:   Kcal:  [id number]  Protein:  90-100 grams  Fluid:  2.5-2.9 L/day  EDUCATION NEEDS:   No education needs identified at this time   [place], LDN Inpatient Clinical Dietitian Pager: [phone] After Hours Pager: [phone]

## 2015-10-19 NOTE — Progress Notes (Signed)
   10/19/15 1542  PT Visit Information  Last PT Received On 10/19/15  Reason Eval/Treat Not Completed Patient declined, no reason specified (Pt remains to refuse PT intervention despite education and encouragement.  Will continue efforts pending patient participation.  )

## 2015-10-19 NOTE — Progress Notes (Signed)
Occupational Therapy Evaluation Patient Details Name: Javier Rivera MRN: [id number] DOB: 1960-11-20 Today's Date: 10/19/2015    History of Present Illness 55 y.o. male with a history of diabetes mellitus, hypertension, second-degree AV block, ascending aortic aneurysm, diastolic heart failure. MRI reveals Subacute left pontine infarct.   Clinical Impression   Unsure of pt's PLOF as pt would only respond to 25% of therapist's questions and commands this session. Pt reported he "lives everywhere" and "with a bunch of different people." Pt currently presents with generalized weakness (R>L), decreased attention, decreased safety awareness, and balance impairments. Pt completed toilet transfer with min assist for balance and max verbal cues for safety and proper use of RW. Recommending SNF to maximize independence and safety with ADLs and mobility due to pt's deficits and lack of assistance upon discharge. Will continue to follow acutely to address OT needs and goals.    Follow Up Recommendations  SNF    Equipment Recommendations  Other (comment) (TBD in next venue)    Recommendations for Other Services       Precautions / Restrictions Precautions Precautions: Fall Restrictions Weight Bearing Restrictions: No      Mobility Bed Mobility Overal bed mobility: Needs Assistance Bed Mobility: Supine to Sit;Sit to Supine     Supine to sit: Supervision Sit to supine: Supervision   General bed mobility comments: HOB flat, no use of bedrails. Pt required extra time and effort.   Transfers Overall transfer level: Needs assistance Equipment used: Rolling walker (2 wheeled) Transfers: Sit to/from Stand Sit to Stand: Min guard         General transfer comment: Min guard assist for balance. Verbal cues for safe hand placement on seated surfaces and to keep RW in front of body at all times and not abandon RW when approaching targeted surfaces.    Balance Overall balance assessment:  Needs assistance Sitting-balance support: No upper extremity supported;Feet supported Sitting balance-Leahy Scale: Normal     Standing balance support: Single extremity supported;During functional activity Standing balance-Leahy Scale: Poor Standing balance comment: Reliant on at least 1 UE support to maintain safe standing balance.                            ADL Overall ADL's : Needs assistance/impaired                 Upper Body Dressing : Set up;Sitting   Lower Body Dressing: Minimal assistance;Sit to/from stand Lower Body Dressing Details (indicate cue type and reason): Pt able to reach feet, but complained of low back pain Toilet Transfer: Minimal assistance;Ambulation;BSC;RW;Cueing for safety Toilet Transfer Details (indicate cue type and reason): Cues to keep RW in front of pt instead of leaving it behind when approaching targeted surface Toileting- Clothing Manipulation and Hygiene: Minimal assistance;Sit to/from stand       Functional mobility during ADLs: Minimal assistance;Rolling walker General ADL Comments: PT responding minimally to therapist's questions and commands. Pt reports he has pain all over that makes it hard to do ADLs right now - unable to verbalize how he was doing ADLs before admission     Vision Vision Assessment?: Yes (limited evaluation) Eye Alignment: Within Functional Limits Ocular Range of Motion: Within Functional Limits Alignment/Gaze Preference: Chin down Tracking/Visual Pursuits: Requires cues, head turns, or add eye shifts to track;Impaired - to be further tested in functional context Saccades: Other (comment) (not following commands to complete testing) Convergence: Other (comment) (not following commands to  complete testing) Additional Comments: Unable to complete full vision screening due to pt's lack of participation and minimal responses to questions and commands   Perception Perception Perception Tested?: No    Praxis Praxis Praxis tested?: Not tested    Pertinent Vitals/Pain Pain Assessment: Faces Faces Pain Scale: Hurts little more Pain Location: "Everywhere" Pain Descriptors / Indicators: Other (Comment);Tingling;Sore;Sharp ("all of the above" in repsonse to therapist's prompts) Pain Intervention(s): Limited activity within patient's tolerance;Monitored during session;Repositioned     Hand Dominance Right   Extremity/Trunk Assessment Upper Extremity Assessment Upper Extremity Assessment: Generalized weakness;RUE deficits/detail;LUE deficits/detail RUE Deficits / Details: STRENGTH: 2+/5; AROM: shoulder flexion 90 due to old rotator cuff injury; SENSATION: numbness/tingling, localized touch intact RUE: Unable to fully assess due to pain (old rotator cuff injury) RUE Sensation: decreased light touch RUE Coordination: decreased fine motor;decreased gross motor LUE Deficits / Details: STRENGTH: 3/5; AROM shoulder flexion 90 due to old rotator cuff injury, others wfl; SENSATION: intact LUE: Unable to fully assess due to pain (old rotator cuff injury)   Lower Extremity Assessment Lower Extremity Assessment: RLE deficits/detail;Defer to PT evaluation RLE Deficits / Details: STRENGTH: difficult to assess; 'AROM: ankle dorsiflexion limited, others wfl; SENSATION: numbness/tingling RLE Sensation: decreased light touch   Cervical / Trunk Assessment Cervical / Trunk Assessment: Normal   Communication Communication Communication: No difficulties   Cognition Arousal/Alertness: Awake/alert Behavior During Therapy: Flat affect Overall Cognitive Status: Difficult to assess                     General Comments       Exercises       Shoulder Instructions      Home Living Family/patient expects to be discharged to:: Shelter/Homeless ("lives everywhere") Living Arrangements: Alone   Type of Home: Homeless                       Home Equipment: None   Additional  Comments: Pt stated that he "lives everywhere" and "with a bunch of different people." Pt only answering 25% of therapist questions.      Prior Functioning/Environment Level of Independence: Independent             OT Diagnosis: Generalized weakness;Acute pain;Cognitive deficits   OT Problem List: Decreased strength;Decreased range of motion;Impaired balance (sitting and/or standing);Decreased activity tolerance;Decreased coordination;Decreased safety awareness;Decreased knowledge of use of DME or AE;Decreased knowledge of precautions;Pain;Impaired sensation   OT Treatment/Interventions: Self-care/ADL training;Therapeutic exercise;Energy conservation;DME and/or AE instruction;Cognitive remediation/compensation;Therapeutic activities;Patient/family education;Balance training    OT Goals(Current goals can be found in the care plan section) Acute Rehab OT Goals Patient Stated Goal: none stated OT Goal Formulation: With patient Time For Goal Achievement: 11/02/15 Potential to Achieve Goals: Good ADL Goals Pt Will Perform Grooming: with supervision;standing Pt Will Perform Upper Body Bathing: with modified independence;sitting Pt Will Perform Lower Body Bathing: with modified independence;sit to/from stand Pt Will Transfer to Toilet: with supervision;ambulating;bedside commode Pt Will Perform Toileting - Clothing Manipulation and hygiene: with supervision;sit to/from stand  OT Frequency: Min 2X/week   Barriers to D/C: Decreased caregiver support  Homeless and unsure of sister's ability to assist pt       Co-evaluation              End of Session Equipment Utilized During Treatment: Gait belt;Rolling walker Nurse Communication: Mobility status  Activity Tolerance: Patient limited by fatigue Patient left: in bed;with call bell/phone within reach;with bed alarm set   Time: 1100-1119 OT Time  Calculation (min): 19 min Charges:  OT General Charges $OT Visit: 1 Procedure OT  Evaluation $OT Eval Moderate Complexity: 1 Procedure G-Codes:    Redmond Baseman, OTR/L [phone] 10/19/2015, 12:33 PM

## 2015-10-20 DIAGNOSIS — I639 Cerebral infarction, unspecified: Principal | ICD-10-CM

## 2015-10-20 LAB — GLUCOSE, CAPILLARY
GLUCOSE-CAPILLARY: 127 mg/dL — AB (ref 65–99)
Glucose-Capillary: 107 mg/dL — ABNORMAL HIGH (ref 65–99)

## 2015-10-20 LAB — HEMOGLOBIN A1C
Hgb A1c MFr Bld: 5.7 % — ABNORMAL HIGH (ref 4.8–5.6)
Mean Plasma Glucose: 117 mg/dL

## 2015-10-20 MED ORDER — ENSURE ENLIVE PO LIQD
237.0000 mL | Freq: Two times a day (BID) | ORAL | Status: DC
  Administered 2015-10-20: 237 mL via ORAL
  Filled 2015-10-20 (×4): qty 237

## 2015-10-20 MED ORDER — ASPIRIN 325 MG PO TBEC: 325.0000 mg | DELAYED_RELEASE_TABLET | Freq: Every day | ORAL | Status: DC

## 2015-10-20 MED ORDER — POLYETHYLENE GLYCOL 3350 17 G PO PACK: 17.0000 g | PACK | Freq: Every day | ORAL | Status: DC

## 2015-10-20 MED ORDER — ENSURE ENLIVE PO LIQD: 237.0000 mL | Freq: Two times a day (BID) | ORAL | Status: DC

## 2015-10-20 MED ORDER — ATORVASTATIN CALCIUM 40 MG PO TABS: 40.0000 mg | ORAL_TABLET | Freq: Every day | ORAL | Status: DC

## 2015-10-20 NOTE — Clinical Social Work Note (Signed)
Clinical Social Worker facilitated patient discharge including contacting patient family and facility to confirm patient discharge plans.  Clinical information faxed to facility and family agreeable with plan.  CSW arranged ambulance transport via [facility] to [place] at 3:13PM.  RN to call report prior to discharge.  Clinical Social Worker will sign off for now as social work intervention is no longer needed. Please consult [place] again if new need arises.  Glendon Axe, MSW, LCSWA [phone] 10/20/2015 10:50 PM

## 2015-10-20 NOTE — Clinical Social Work Note (Signed)
Clinical Social Work Assessment  Patient Details  Name: Javier Rivera MRN: [id number] Date of Birth: 1961/06/05  Date of referral:  10/20/15               Reason for consult:  Facility Placement, Discharge Planning                Permission sought to share information with:  Case Manager, Customer service manager Permission granted to share information::  No   Housing/Transportation Living arrangements for the past 2 months:  Hotel/Motel Source of Information:  Patient Patient Interpreter Needed:  None Criminal Activity/Legal Involvement Pertinent to Current Situation/Hospitalization:  No - Comment as needed Significant Relationships:  Siblings Lives with:  Self Do you feel safe going back to the place where you live?  Yes Need for family participation in patient care:  No (Coment)  Care giving concerns: Patient homeless and requiring SNF placement at skilled level.   Social Worker assessment / plan: Holiday representative met with patient in regards to SNF placement. CSW introduced CSW role and SNF process. CSW also explained Letter of Guarantee process to patient as he does not have insurance. CSW also expressed the importance of working with PT when they come to visit. Patient stated that he did not work with PT yesterday b/c he was constipated and not feeling well. Patient agreed to work with PT today. Patient agreeable to SNF process and chooses [facility] and [facility]. Patient did not give CSW permission to contact family and stated he would contact family as needed. Patient also reported living in hotel for the past couple of weeks. Patient stated he does not plan to remain in SNF long-term and will have other living arrangements at d/c. Facility aware of homelessness. 30 day LOG for room and board, 14 days for PT/OT. No further concerns reported at this time. CSW remains available as needed.   Employment status:  Unemployed Forensic scientist:  Self Pay  (Medicaid Pending) PT Recommendations:  [place] / Referral to community resources:  [place]  Patient/Family's Response to care:  Pt a/o x 4. Pt agreeable to SNF placement at [facility] and [facility]. Pt somewhat withdrawn during assessment however appreciated social work intervention.   Patient/Family's Understanding of and Emotional Response to Diagnosis, Current Treatment, and Prognosis:  [place] aware of being positive for stroke.   Emotional Assessment Appearance:  Appears stated age Attitude/Demeanor/Rapport:  Avoidant Affect (typically observed):  Apprehensive, Agitated Orientation:  Oriented to Self, Oriented to Place, Oriented to  Time, Oriented to Situation Alcohol / Substance use:  Illicit Drugs Psych involvement (Current and /or in the community):  No (Comment)  Discharge Needs  Concerns to be addressed:  Care Coordination, Homelessness Readmission within the last 30 days:  No Current discharge risk:  Dependent with Mobility, Homeless, None Barriers to Discharge:  Homeless with medical needs, Inadequate or no insurance   [place], MSW, [place] [phone] 10/20/2015 10:47 PM

## 2015-10-20 NOTE — Clinical Social Work Placement (Signed)
   CLINICAL SOCIAL WORK PLACEMENT  NOTE  Date:  10/20/2015  Patient Details  Name: Javier Rivera MRN: [id number] Date of Birth: [birth year]  Clinical Social Work is seeking post-discharge placement for this patient at the [place] level of care (*CSW will initial, date and re-position this form in  chart as items are completed):  Yes   Patient/family provided with [facility] Work Department's list of facilities offering this level of care within the geographic area requested by the patient (or if unable, by the patient's family).  Yes   Patient/family informed of their freedom to choose among providers that offer the needed level of care, that participate in Medicare, Medicaid or managed care program needed by the patient, have an available bed and are willing to accept the patient.  Yes   Patient/family informed of [facility]'s ownership interest in [facility] and [facility], as well as of the fact that they are under no obligation to receive care at these facilities.  PASRR submitted to EDS on 10/20/15     PASRR number received on 10/20/15     Existing PASRR number confirmed on 10/20/15     FL2 transmitted to all facilities in geographic area requested by pt/family on 10/20/15     FL2 transmitted to all facilities within larger geographic area on       Patient informed that his/her managed care company has contracts with or will negotiate with certain facilities, including the following:        Yes   Patient/family informed of bed offers received.  Patient chooses bed at  ([facility] )     Physician recommends and patient chooses bed at      Patient to be transferred to  ([facility] ) on 10/20/15.  Patient to be transferred to facility by  [facility] )     Patient family notified on 10/20/15 of transfer.  Name of family member notified:   (Pt stated he would notified family. )     PHYSICIAN Please  prepare priority discharge summary, including medications, Please sign FL2     Additional Comment:    _______________________________________________ Rozell Searing, LCSW 10/20/2015, 10:49 PM

## 2015-10-20 NOTE — Progress Notes (Signed)
Physical Therapy Treatment Patient Details Name: Javier Rivera MRN: [id number] DOB: 30-Sep-1960 Today's Date: 10/20/2015    History of Present Illness 55 y.o. male with a history of diabetes mellitus, hypertension, second-degree AV block, ascending aortic aneurysm, diastolic heart failure. MRI reveals Subacute left pontine infarct.    PT Comments    Pt with inconsistent presentation of deficits. Pt with noted R knee instability and dragged R toe during amb with RW however when pt tried ambulating without AD, "i just want to see if I can do it", pt able to clear R foot without difficulty. Regardless of AD pt does demo increased R knee flexion and/or hyperextension in stance phase. Educated pt on importance of PT to improve safety with mobility and independence.  Follow Up Recommendations  SNF     Equipment Recommendations  Rolling walker with 5" wheels    Recommendations for Other Services       Precautions / Restrictions Precautions Precautions: Fall Restrictions Weight Bearing Restrictions: No    Mobility  Bed Mobility Overal bed mobility: Needs Assistance Bed Mobility: Supine to Sit     Supine to sit: Supervision     General bed mobility comments: HOB flat, pt able to bring LEs off EOB without difficulty and use bilat UEs to elevate trunk  Transfers Overall transfer level: Needs assistance Equipment used: Rolling walker (2 wheeled) Transfers: Sit to/from Stand Sit to Stand: Min guard         General transfer comment: v/c's to push up from bed, pt with unconventional way of getting up, pushing himself towards the R side and spinging self around. pt also very tall 6'7'  Ambulation/Gait Ambulation/Gait assistance: Min guard Ambulation Distance (Feet): 10 Feet (x2, to/from bathroom) Assistive device: Rolling walker (2 wheeled) Gait Pattern/deviations: Step-through pattern;Decreased stride length (decreased step height) Gait velocity: decreased Gait velocity  interpretation: Below normal speed for age/gender General Gait Details: pt dragging R foot in controlled manor across floor however able to weight-bear through R LE but does demo R knee instability and hyperextension. pt did amb in room without AD against PTs advice. Pt able to clear R foot fine but did have an exaggerated limp on R side with R knee flexion   Stairs            Wheelchair Mobility    Modified Rankin (Stroke Patients Only) Modified Rankin (Stroke Patients Only) Pre-Morbid Rankin Score: No symptoms Modified Rankin: Moderately severe disability     Balance Overall balance assessment: Needs assistance Sitting-balance support: No upper extremity supported;Feet supported Sitting balance-Leahy Scale: Normal     Standing balance support: Bilateral upper extremity supported Standing balance-Leahy Scale: Poor Standing balance comment: reliant on RW                    Cognition Arousal/Alertness: Awake/alert Behavior During Therapy: Flat affect Overall Cognitive Status: Impaired/Different from baseline Area of Impairment: Safety/judgement         Safety/Judgement: Decreased awareness of deficits;Decreased awareness of safety     General Comments: pt impulsive however incosistent with demonstration of deficits vs function    Exercises      General Comments General comments (skin integrity, edema, etc.): pt assisted to the bathroom, pt performed hygiene with supervision. max v/c's for safety with walker management to go to sink and wash hands      Pertinent Vitals/Pain Pain Assessment: No/denies pain Pain Location: c/o consitpation    Home Living  Prior Function            PT Goals (current goals can now be found in the care plan section) Acute Rehab PT Goals Patient Stated Goal: none stated Progress towards PT goals: Progressing toward goals    Frequency  Min 4X/week    PT Plan Current plan remains  appropriate    Co-evaluation             End of Session Equipment Utilized During Treatment: Gait belt Activity Tolerance: Treatment limited secondary to agitation       Time: [id number] PT Time Calculation (min) (ACUTE ONLY): 33 min  Charges:  $Gait Training: 8-22 mins $Therapeutic Activity: 8-22 mins                    G Codes:      Kingsley Callander 10/20/2015, 11:48 AM   Kittie Plater, PT, DPT Pager #: [phone] Office #: [phone]

## 2015-10-20 NOTE — NC FL2 (Signed)
[place] LEVEL OF CARE SCREENING TOOL     IDENTIFICATION  Patient Name: Javier Rivera Birthdate: Dec 18, 1960 Sex: male Admission Date (Current Location): 10/17/2015  [facility] and [place] Number:  Herbalist and Address:  [facility], [place] [address] [place], [place] [address]      Provider Number: [id number]  Attending Physician Name and Address:  Louellen Molder, MD  Relative Name and Phone Number:       Current Level of Care: Hospital Recommended Level of Care: [place] Prior Approval Number:    Date Approved/Denied:   PASRR Number:  [id number]    Discharge Plan: SNF    Current Diagnoses: Patient Active Problem List   Diagnosis Date Noted  . Right sided weakness 10/17/2015  . DM type 2, goal A1c below 7 10/17/2015  . Benign essential HTN 10/17/2015  . CVA (cerebral infarction) 10/17/2015  . Sensory disturbance 04/25/2015  . Ascending aorta dilatation (HCC)   . Protein-calorie malnutrition, severe ([place]) 04/24/2015  . Heart block AV second degree 04/23/2015  . Dizziness 04/23/2015  . Diabetes mellitus type 2, controlled ([place]) 04/23/2015  . Essential hypertension 04/23/2015  . Tobacco abuse 04/23/2015  . AV block, 2nd degree   . SEBORRHEIC DERMATITIS 05/07/2010  . RECTAL BLEEDING 08/14/2009  . DIABETES MELLITUS, TYPE II, UNCONTROLLED 06/11/2009  . DYSLIPIDEMIA 06/11/2009  . COCAINE ABUSE 06/11/2009  . ESSENTIAL HYPERTENSION 06/11/2009  . DIASTOLIC DYSFUNCTION [id number]  . RENAL INSUFFICIENCY 06/11/2009    Orientation RESPIRATION BLADDER Height & Weight     Self, Time, Situation, Place  Normal Continent Weight: 186 lb 3.2 oz (84.46 kg) Height:  6\' 9"  (205.7 cm)  BEHAVIORAL SYMPTOMS/MOOD NEUROLOGICAL BOWEL NUTRITION STATUS   (NONE )  (NONE ) Continent Diet (HEART HEALTHY/ CARB MODIFIED )  AMBULATORY STATUS COMMUNICATION OF NEEDS Skin   Limited Assist Verbally Normal                        Personal Care Assistance Level of Assistance  Bathing, Feeding, Dressing Bathing Assistance: Limited assistance Feeding assistance: Independent Dressing Assistance: Limited assistance     Functional Limitations Info  Sight, Hearing, Speech Sight Info: Adequate Hearing Info: Adequate Speech Info: Adequate    SPECIAL CARE FACTORS FREQUENCY  PT (By licensed PT), OT (By licensed OT)     PT Frequency: 4 OT Frequency: 2            Contractures      Additional Factors Info  Code Status, Allergies Code Status Info: FULL CODE  Allergies Info: Clonidine Derivatives, Lisinopril           Current Medications (10/20/2015):  This is the current hospital active medication list Current Facility-Administered Medications  Medication Dose Route Frequency Provider Last Rate Last Dose  . 0.9 %  sodium chloride infusion   Intravenous Continuous Debbe Odea, MD 125 mL/hr at 10/18/15 0100    . aspirin EC tablet 325 mg  325 mg Oral Daily Rosalin Hawking, MD   325 mg at 10/20/15 1103   Or  . aspirin suppository 300 mg  300 mg Rectal Daily Rosalin Hawking, MD      . atorvastatin (LIPITOR) tablet 40 mg  40 mg Oral q1800 Rosalin Hawking, MD   40 mg at 10/19/15 1823  . enoxaparin (LOVENOX) injection 40 mg  40 mg Subcutaneous Q24H Debbe Odea, MD   40 mg at 10/19/15 1823  . feeding supplement (ENSURE ENLIVE) (ENSURE  ENLIVE) liquid 237 mL  237 mL Oral BID BM Nishant Dhungel, MD   237 mL at 10/20/15 1103  . insulin aspart (novoLOG) injection 0-15 Units  0-15 Units Subcutaneous TID WC Debbe Odea, MD   3 Units at 10/18/15 1805  . insulin aspart (novoLOG) injection 0-5 Units  0-5 Units Subcutaneous QHS Debbe Odea, MD   0 Units at 10/17/15 2200  . iohexol (OMNIPAQUE) 350 MG/ML injection 80 mL  80 mL Intravenous Once PRN Nita Sells, MD      . polyethylene glycol (MIRALAX / GLYCOLAX) packet 17 g  17 g Oral Daily Nishant Dhungel, MD   17 g at 10/20/15 1103  . senna-docusate (Senokot-S) tablet 2 tablet   2 tablet Oral BID Louellen Molder, MD   2 tablet at 10/20/15 1103     Discharge Medications: Please see discharge summary for a list of discharge medications.  Relevant Imaging Results:  Relevant Lab Results:   Additional Information SSN [id number]  Glendon Axe, MSW, LCSWA [phone] 10/20/2015 11:42 AM

## 2015-10-20 NOTE — Progress Notes (Signed)
Pt noticed to have a change of his heart rhythm from sinus brady to 1st and 2nd degree heart block and switching to complete heart block, got called from [facility], pt calm and sleeping in bed, denies any discomfort, NP Sports coach (on call) paged and notified, ordered an EKG same done, read Sinus brady with arrhythmia and 1st degree AV Block, same reported back to the on call doc, no new orders at this time, said to continue to monitor pt. Javier Rivera, Javier Rivera

## 2015-10-20 NOTE — Discharge Summary (Signed)
Physician Discharge Summary  Jiayi Koberstein [id number] DOB: 03/02/1961 DOA: 10/17/2015  PCP: No primary care provider on file.  Admit date: 10/17/2015 Discharge date: 10/20/2015  Time spent: 30 minutes  Recommendations for Outpatient Follow-up:  Discharged to skilled nursing facility. Follow-up as stroke clinic in 2 months   Discharge Diagnoses:  Principal Problem:  acute ischemic stroke with  Right sided weakness  Active Problems:   DIASTOLIC DYSFUNCTION   Tobacco abuse   AV block, 2nd degree   Protein-calorie malnutrition, severe (HCC)   Ascending aorta dilatation (HCC)   DM type 2, goal A1c below 7   Benign essential HTN   CVA (cerebral infarction)   Discharge Condition: fair  Diet recommendation: heart healthy/ diabetic  Code status: Full code  Filed Weights   10/17/15 1319 10/18/15 0001  Weight: 81.647 kg (180 lb) 84.46 kg (186 lb 3.2 oz)    History of present illness:  Please refer to admission H&P for details, in brief, 55 year old homeless male with Mobitz type I, aortic root dilatation, uncontrolled hypertension, type 2 diabetes mellitus, ongoing tobacco use, hyperlipidemia presented with acute onset of right arm and leg weakness with numbness. He reports that he has had poor by mouth intake for the past 1 week. He is not been able to see a doctor due to his homelessness status. When his sister checked on him she found that he was unable to stand and this brought to the ED. Patient did not get TPA as he was out of the therapeutic window. Workup done showed left pontine infarct.  Hospital Course:  Acute/subacute left pontine infarct Risk factors include uncontrolled hypertension, diabetes mellitus, ongoing tobacco use and hyperlipidemia. MRI brain shows subacute left pontine infarct. CT angiogram of the head and neck shows no large vessel stenosis. 2-D echo shows EF of 55-60%.. LDL of 115.Added statin. Patient started on full dose aspirin. Patient has residual  mild right facial droop with diminished sensation and right hemiparesis. Seen by PT and recommends skilled nursing facility.  Follow up with stroke clinic as outpatient.   Uncontrolled hypertension Not on any medications. Allowing permissive blood pressure.  Hyperlipidemia LDL of 115. Added Lipitor.  Type 2 diabetes mellitus Not On any medications. A1c of 5.7.  tobacco abuse Counseled strongly on cessation.   Protein calorie malnutrition Added supplement.  Constipation Continue MiraLAX.     Family Communication: None at bedside Disposition Plan: Skilled nursing facility   Consultants:  Neurology  Procedures:  MRI brain  2-D echo  CT angiogram of the neck  Antibiotics:  None  Discharge Exam: Filed Vitals:   10/20/15 0901 10/20/15 1329  BP: 145/91 138/89  Pulse: 60 63  Temp: 98.4 F (36.9 C) 97.6 F (36.4 C)  Resp: 16 20     General: Middle aged male not in distress  HEENT: Moist mucosa  chest: Clear bilaterally  Cardiovascular: Normal S1 and S2, no murmurs rub or gallop  GI: Soft, nondistended, nontender  Musculoskeletal: On, no edema  CNS: Alert and oriented, mild right facial droop, 4/5 power in right upper extremity, 3/5 power in right leg  Discharge Instructions   Discharge Instructions    Ambulatory referral to Neurology    Complete by:  As directed   Follow up with Cecille Rubin NP at [facility] in 2 months. Thanks.          Current Discharge Medication List    START taking these medications   Details  aspirin EC 325 MG EC tablet Take 1 tablet (  325 mg total) by mouth daily. Qty: 30 tablet, Refills: 0    atorvastatin (LIPITOR) 40 MG tablet Take 1 tablet (40 mg total) by mouth daily at 6 PM. Qty: 30 tablet, Refills: 0    feeding supplement, ENSURE ENLIVE, (ENSURE ENLIVE) LIQD Take 237 mLs by mouth 2 (two) times daily between meals. Qty: 237 mL, Refills: 12    polyethylene glycol (MIRALAX / GLYCOLAX) packet Take 17 g by  mouth daily. Qty: 14 each, Refills: 0       Allergies  Allergen Reactions  . Clonidine Derivatives     Pt reported the sweats and shakes  . Lisinopril Palpitations    "made me real sick, made my heart rate scatter"   Follow-up Information    Follow up with Dennie Bible, NP. Schedule an appointment as soon as possible for a visit in 2 months.   Specialty:  Family Medicine   Why:  stroke clinic   Contact information:   [address] [place] [address] [phone]       Please follow up.   Why:  MD at SNF       The results of significant diagnostics from this hospitalization (including imaging, microbiology, ancillary and laboratory) are listed below for reference.    Significant Diagnostic Studies: Ct Angio Head W/cm &/or Wo Cm  10/18/2015  CLINICAL DATA:  Acute onset RIGHT extremity numbness and weakness. History of hypertension and diabetes. Currently homeless. EXAM: CT ANGIOGRAPHY HEAD AND NECK TECHNIQUE: Multidetector CT imaging of the head and neck was performed using the standard protocol during bolus administration of intravenous contrast. Multiplanar CT image reconstructions and MIPs were obtained to evaluate the vascular anatomy. Carotid stenosis measurements (when applicable) are obtained utilizing NASCET criteria, using the distal internal carotid diameter as the denominator. COMPARISON:  MRI of the head March 11th 2017 FINDINGS: CT HEAD No abnormal intracranial enhancement. CTA NECK Aortic arch: Normal appearance of the thoracic arch, normal branch pattern. The origins of the innominate, left Common carotid artery and subclavian artery are widely patent. Right carotid system: Common carotid artery is widely patent, coursing in a straight line fashion. Normal appearance of the carotid bifurcation without hemodynamically significant stenosis by NASCET criteria. Minimal eccentric calcific atherosclerosis. Normal appearance of the included internal  carotid artery. Left carotid system: Common carotid artery is widely patent, coursing in a straight line fashion. Normal appearance of the carotid bifurcation without hemodynamically significant stenosis by NASCET criteria. Minimal eccentric calcific atherosclerosis. Normal appearance of the included internal carotid artery. Vertebral arteries:Left vertebral artery is dominant. Normal appearance of the vertebral arteries, which appear widely patent. Skeleton: No acute osseous process though bone windows have not been submitted. Moderate degenerative change of the cervical spine. Multiple dental caries, periapical lucency/abscess. Other neck: Soft tissues of the neck are nonacute though, not tailored for evaluation. Small RIGHT laryngocele. CTA HEAD Anterior circulation: Normal appearance of the cervical internal carotid arteries, petrous, cavernous and supra clinoid internal carotid arteries. Widely patent anterior communicating artery. Normal appearance of the anterior and middle cerebral arteries. Posterior circulation: Normal appearance of the vertebral arteries, vertebrobasilar junction and basilar artery, as well as main branch vessels. Normal appearance of the posterior cerebral arteries. Mild luminal regularity of the mid to distal cerebral arteries. No large vessel occlusion, hemodynamically significant stenosis, dissection, contrast extravasation or aneurysm within the anterior nor posterior circulation. IMPRESSION: CTA NECK: Minimal atherosclerosis without hemodynamically significant stenosis or acute vascular process. Poor dentition. CTA HEAD: No emergent large vessel  occlusion or high-grade stenosis. Mild luminal irregularity of the mid to distal vessels compatible with atherosclerosis. Electronically Signed   By: Elon Alas M.D.   On: 10/18/2015 06:19   Ct Head Wo Contrast  10/17/2015  CLINICAL DATA:  55 year old male with right-sided numbness/ weakness EXAM: CT HEAD WITHOUT CONTRAST  TECHNIQUE: Contiguous axial images were obtained from the base of the skull through the vertex without intravenous contrast. COMPARISON:  Prior brain MRI 04/25/2015 FINDINGS: Negative for acute intracranial hemorrhage, acute infarction, mass, mass effect, hydrocephalus or midline shift. Gray-white differentiation is preserved throughout. Similar pattern of advanced periventricular, subcortical and deep white matter hypoattenuation which remains nonspecific but most consistent with chronic microvascular ischemic white matter disease. No focal soft tissue or calvarial abnormality. Normal aeration of mastoid air cells and visualized paranasal sinuses. IMPRESSION: 1. No acute intracranial abnormality. 2. 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 Electronically Signed   By: Jacqulynn Cadet M.D.   On: 10/17/2015 14:14   Ct Angio Neck W/cm &/or Wo/cm  10/18/2015  CLINICAL DATA:  Acute onset RIGHT extremity numbness and weakness. History of hypertension and diabetes. Currently homeless. EXAM: CT ANGIOGRAPHY HEAD AND NECK TECHNIQUE: Multidetector CT imaging of the head and neck was performed using the standard protocol during bolus administration of intravenous contrast. Multiplanar CT image reconstructions and MIPs were obtained to evaluate the vascular anatomy. Carotid stenosis measurements (when applicable) are obtained utilizing NASCET criteria, using the distal internal carotid diameter as the denominator. COMPARISON:  MRI of the head March 11th 2017 FINDINGS: CT HEAD No abnormal intracranial enhancement. CTA NECK Aortic arch: Normal appearance of the thoracic arch, normal branch pattern. The origins of the innominate, left Common carotid artery and subclavian artery are widely patent. Right carotid system: Common carotid artery is widely patent, coursing in a straight line fashion. Normal appearance of the carotid bifurcation without hemodynamically significant stenosis by  NASCET criteria. Minimal eccentric calcific atherosclerosis. Normal appearance of the included internal carotid artery. Left carotid system: Common carotid artery is widely patent, coursing in a straight line fashion. Normal appearance of the carotid bifurcation without hemodynamically significant stenosis by NASCET criteria. Minimal eccentric calcific atherosclerosis. Normal appearance of the included internal carotid artery. Vertebral arteries:Left vertebral artery is dominant. Normal appearance of the vertebral arteries, which appear widely patent. Skeleton: No acute osseous process though bone windows have not been submitted. Moderate degenerative change of the cervical spine. Multiple dental caries, periapical lucency/abscess. Other neck: Soft tissues of the neck are nonacute though, not tailored for evaluation. Small RIGHT laryngocele. CTA HEAD Anterior circulation: Normal appearance of the cervical internal carotid arteries, petrous, cavernous and supra clinoid internal carotid arteries. Widely patent anterior communicating artery. Normal appearance of the anterior and middle cerebral arteries. Posterior circulation: Normal appearance of the vertebral arteries, vertebrobasilar junction and basilar artery, as well as main branch vessels. Normal appearance of the posterior cerebral arteries. Mild luminal regularity of the mid to distal cerebral arteries. No large vessel occlusion, hemodynamically significant stenosis, dissection, contrast extravasation or aneurysm within the anterior nor posterior circulation. IMPRESSION: CTA NECK: Minimal atherosclerosis without hemodynamically significant stenosis or acute vascular process. Poor dentition. CTA HEAD: No emergent large vessel occlusion or high-grade stenosis. Mild luminal irregularity of the mid to distal vessels compatible with atherosclerosis. Electronically Signed   By: Elon Alas M.D.   On: 10/18/2015 06:19   Mr Brain Wo Contrast  10/17/2015   CLINICAL DATA:  Right arm and leg weakness and numbness for greater  than 1 week. History of hypertension and diabetes. EXAM: MRI HEAD WITHOUT CONTRAST TECHNIQUE: Multiplanar, multiecho pulse sequences of the brain and surrounding structures were obtained without intravenous contrast. COMPARISON:  Head CT earlier today and MRI 04/25/2015 FINDINGS: There is a linear focus of abnormal diffusion signal in the inferior left paramedian pons with normal ADC and new regional T2 hyperintensity, likely reflecting an early subacute infarct given the patient's symptom duration. There is no evidence of recent supratentorial infarct. There is no evidence of intracranial hemorrhage, mass, midline shift, or extra-axial fluid collection. Patchy T2 hyperintensities throughout the subcortical and deep cerebral white matter bilaterally are similar to the prior MRI and compatible with moderate chronic small vessel ischemic disease, greatly advanced for age. Orbits are unremarkable. Paranasal sinuses and mastoid air cells are clear. Major intracranial vascular flow voids are preserved. IMPRESSION: 1. Subacute left pontine infarct. 2. Moderate chronic small vessel ischemic disease. Electronically Signed   By: Logan Bores M.D.   On: 10/17/2015 19:23   Ct Abdomen Pelvis W Contrast  10/02/2015  CLINICAL DATA:  Weakness, wt loss, constipation   Decreased appetite EXAM: CT ABDOMEN AND PELVIS WITH CONTRAST TECHNIQUE: Multidetector CT imaging of the abdomen and pelvis was performed using the standard protocol following bolus administration of intravenous contrast. CONTRAST:  144mL OMNIPAQUE IOHEXOL 300 MG/ML  SOLN COMPARISON:  Radiographs 09/16/2015 and earlier studies FINDINGS: Lower chest: Minimal dependent atelectasis posteriorly in the visualized lung bases. Hepatobiliary: 3.9 cm subcapsular lesion in segment 2 near the dome which shows peripheral contrast puddling on portal venous phase imaging, stable since 04/24/2015. No other focal  hepatic lesion or intrahepatic bile duct dilatation. Gallbladder unremarkable. Pancreas: No mass, inflammatory changes, or other significant abnormality. Spleen: Within normal limits in size and appearance. Adrenals/Urinary Tract: 3 cm mixed attenuation right adrenal mass with macroscopic fat, stable. 13 mm low-attenuation left adrenal nodule, stable. Bilateral renal cysts, largest from the left lower pole 5.7 cm. largest on the right in the upper pole 77mm diameter. No solid renal mass or hydronephrosis. No nephrolithiasis evident. Ureters decompressed. Urinary bladder physiologically distended. Stomach/Bowel: No evidence of obstruction, inflammatory process, or abnormal fluid collections. Moderate colonic fecal material. Normal appendix. Vascular/Lymphatic: Fusiform infrarenal aorta 3.2 cm maximum transverse diameter. Patchy aortoiliac arterial plaque without stenosis. Ectatic bilateral iliac arteries common iliac arteries, right 16 mm diameter, left 17 mm. Portal vein patent. Subcentimeter left para-aortic lymph nodes. No mesenteric or pelvic adenopathy. Reproductive: Mild prostatic enlargement. Other: No ascites.  No free air. Musculoskeletal:  No suspicious bone lesions identified. IMPRESSION: 1. No acute abdominal process. 2. 3.2 cm infrarenal abdominal aortic aneurysm and dilated common iliac arteries. Recommend followup by ultrasound in 3 years. This recommendation follows ACR consensus guidelines: White Paper of the ACR Incidental Findings Committee II on Vascular Findings. J Am Coll Radiol 2013; [id number] 3. Stable benign-appearing bilateral adrenal and left hepatic lesions as above. Electronically Signed   By: Lucrezia Europe M.D.   On: 10/02/2015 11:57    Microbiology: No results found for this or any previous visit (from the past 240 hour(s)).   Labs: Basic Metabolic Panel:  Recent Labs Lab 10/17/15 1330 10/17/15 1341 10/19/15 0759  NA 141 143 141  K 4.4 4.3 3.8  CL 104 103 107  CO2 25  --   27  GLUCOSE 234* 225* 115*  BUN 27* 31* 13  CREATININE 1.16 1.10 1.00  CALCIUM 9.1  --  8.6*   Liver Function Tests:  Recent Labs Lab 10/17/15 1330  AST 19  ALT 17  ALKPHOS 70  BILITOT 1.4*  PROT 7.7  ALBUMIN 4.3   No results for input(s): LIPASE, AMYLASE in the last 168 hours. No results for input(s): AMMONIA in the last 168 hours. CBC:  Recent Labs Lab 10/17/15 1330 10/17/15 1341  WBC 3.9*  --   NEUTROABS 3.0  --   HGB 17.4* 18.4*  HCT 50.6 54.0*  MCV 88.0  --   PLT 172  --    Cardiac Enzymes: No results for input(s): CKTOTAL, CKMB, CKMBINDEX, TROPONINI in the last 168 hours. BNP: BNP (last 3 results) No results for input(s): BNP in the last 8760 hours.  ProBNP (last 3 results) No results for input(s): PROBNP in the last 8760 hours.  CBG:  Recent Labs Lab 10/19/15 1152 10/19/15 1631 10/19/15 2213 10/20/15 0636 10/20/15 1130  GLUCAP 97 120* 110* 107* 127*       Signed:  Louellen Molder MD.  [facility] 10/20/2015, 3:00 PM

## 2015-10-20 NOTE — Progress Notes (Signed)
Discharge orders received. Patient educated on discharge instructions and stroke education. Pt verbalized understanding. IV and tele removed. Pt dressed self. Report called to [facility]. [facility] arrived to transport patient.

## 2015-10-22 ENCOUNTER — Encounter: Payer: Self-pay | Admitting: Internal Medicine

## 2015-10-22 ENCOUNTER — Non-Acute Institutional Stay (SKILLED_NURSING_FACILITY): Payer: Self-pay | Admitting: Internal Medicine

## 2015-10-22 DIAGNOSIS — E43 Unspecified severe protein-calorie malnutrition: Secondary | ICD-10-CM

## 2015-10-22 DIAGNOSIS — Z720 Tobacco use: Secondary | ICD-10-CM

## 2015-10-22 DIAGNOSIS — E785 Hyperlipidemia, unspecified: Secondary | ICD-10-CM

## 2015-10-22 DIAGNOSIS — I10 Essential (primary) hypertension: Secondary | ICD-10-CM

## 2015-10-22 DIAGNOSIS — I6302 Cerebral infarction due to thrombosis of basilar artery: Secondary | ICD-10-CM

## 2015-10-22 DIAGNOSIS — E119 Type 2 diabetes mellitus without complications: Secondary | ICD-10-CM

## 2015-10-22 NOTE — Progress Notes (Signed)
MRN: [id number] Name: Traycen Cieslik  Sex: male Age: 55 y.o. DOB: May 26, 1961  [place] #: Karren Burly Facility/Room:131B Level Of Care: SNF Provider: Inocencio Homes D Emergency Contacts: Extended Emergency Contact Information Primary Emergency Contact: Devona Konig [place] of [place] Phone: [phone] Relation: Friend Secondary Emergency Contact: Casimiro Needle [place] of [place] Mobile Phone: [phone] Relation: Sister  Code Status:   Allergies: Clonidine derivatives and Lisinopril  Chief Complaint  Patient presents with  . New Admit To SNF    HPI: Patient is 55 y.o. male with Mobitz type I, aortic root dilatation, uncontrolled hypertension, type 2 diabetes mellitus, ongoing tobacco use, hyperlipidemia presented with acute onset of right arm and leg weakness with numbness. He reports that he has had poor by mouth intake for the past 1 week. He is not been able to see a doctor due to his homelessness status. When his sister checked on him she found that he was unable to stand and this brought to the ED. Patient did not get TPA as he was out of the therapeutic window.Workup done showed left pontine infarct. Pt was admitted to [facility] from 3/11-14 where pt was evaluated and risk factor control was addressed. Pt is admitted to SNF for OT/PT. While at SNF pt will be followed for HLD, tx with lipitor, HTN, on no meds but monitoring daily for possible intervention and protein calorie malnutrition, treated with supplements.  Past Medical History  Diagnosis Date  . Diabetes mellitus without complication ([place])   . Hypertension   . Renal disorder     pt stated that masses were found on his kidneys  . Renal insufficiency     Past Surgical History  Procedure Laterality Date  . Knee surgery      bilateral      Medication List       This list is accurate as of: 10/22/15 11:59 PM.  Always use your most recent med list.               amLODipine 5 MG tablet  Commonly  known as:  NORVASC  Take 5 mg by mouth daily.     aspirin 325 MG EC tablet  Take 1 tablet (325 mg total) by mouth daily.     atorvastatin 40 MG tablet  Commonly known as:  LIPITOR  Take 1 tablet (40 mg total) by mouth daily at 6 PM.     feeding supplement (ENSURE ENLIVE) Liqd  Take 237 mLs by mouth 2 (two) times daily between meals.     polyethylene glycol packet  Commonly known as:  MIRALAX / GLYCOLAX  Take 17 g by mouth daily.        Meds ordered this encounter  Medications  . amLODipine (NORVASC) 5 MG tablet    Sig: Take 5 mg by mouth daily.    Immunization History  Administered Date(s) Administered  . Influenza Whole 06/01/2009, 05/07/2010  . Influenza,inj,Quad PF,36+ Mos 04/24/2015  . Pneumococcal Polysaccharide-23 08/14/2009, 04/24/2015  . Td 11/06/2008    Social History  Substance Use Topics  . Smoking status: Current Every Day Smoker -- 0.25 packs/day for 20 years    Types: Cigarettes  . Smokeless tobacco: Never Used  . Alcohol Use: No    Family history is + HTN, DM2  Review of Systems  DATA OBTAINED: from patient, nurse GENERAL:  no fevers, fatigue, appetite changes SKIN: No itching, rash or wounds EYES: No eye pain, redness, discharge EARS: No earache, tinnitus, change in hearing NOSE: No  congestion, drainage or bleeding  MOUTH/THROAT: No mouth or tooth pain, No sore throat RESPIRATORY: No cough, wheezing, SOB CARDIAC: No chest pain, palpitations, lower extremity edema  GI: No abdominal pain, No N/V/D or constipation, No heartburn or reflux  GU: No dysuria, frequency or urgency, or incontinence  MUSCULOSKELETAL: No unrelieved bone/joint pain NEUROLOGIC: No headache, dizziness or focal weakness PSYCHIATRIC: No c/o anxiety or sadness   Filed Vitals:   10/22/15 1411  BP: 150/96  Pulse: 58  Temp: 97.6 F (36.4 C)  Resp: 20    SpO2 Readings from Last 1 Encounters:  10/20/15 100%        Physical Exam  GENERAL APPEARANCE: Alert,  conversant,  No acute distress.  SKIN: No diaphoresis rash HEAD: Normocephalic, atraumatic  EYES: Conjunctiva/lids clear. Pupils round, reactive. EOMs intact.  EARS: External exam WNL, canals clear. Hearing grossly normal.  NOSE: No deformity or discharge.  MOUTH/THROAT: Lips w/o lesions  RESPIRATORY: Breathing is even, unlabored. Lung sounds are clear   CARDIOVASCULAR: Heart RRR no murmurs, rubs or gallops. No peripheral edema.   GASTROINTESTINAL: Abdomen is soft, non-tender, not distended w/ normal bowel sounds. GENITOURINARY: Bladder non tender, not distended  MUSCULOSKELETAL: No abnormal joints or musculature NEUROLOGIC:  Cranial nerves 2-12 grossly intact except R facial weakness and R side weakness PSYCHIATRIC: Mood and affect appropriate to situation, no behavioral issues  Patient Active Problem List   Diagnosis Date Noted  . Right sided weakness 10/17/2015  . DM type 2, goal A1c below 7 10/17/2015  . Uncontrolled hypertension 10/17/2015  . CVA (cerebral infarction) 10/17/2015  . Sensory disturbance 04/25/2015  . Ascending aorta dilatation (HCC)   . Protein-calorie malnutrition, severe ([place]) 04/24/2015  . Heart block AV second degree 04/23/2015  . Dizziness 04/23/2015  . Diabetes mellitus type 2, controlled ([place]) 04/23/2015  . Essential hypertension 04/23/2015  . Tobacco abuse 04/23/2015  . AV block, 2nd degree   . SEBORRHEIC DERMATITIS 05/07/2010  . RECTAL BLEEDING 08/14/2009  . DIABETES MELLITUS, TYPE II, UNCONTROLLED 06/11/2009  . Hyperlipidemia 06/11/2009  . COCAINE ABUSE 06/11/2009  . ESSENTIAL HYPERTENSION 06/11/2009  . DIASTOLIC DYSFUNCTION [id number]  . RENAL INSUFFICIENCY 06/11/2009    CBC    Component Value Date/Time   WBC 3.9* 10/17/2015 1330   RBC 5.75 10/17/2015 1330   HGB 18.4* 10/17/2015 1341   HCT 54.0* 10/17/2015 1341   HCT 39.9 04/26/2015 0402   PLT 172 10/17/2015 1330   MCV 88.0 10/17/2015 1330   LYMPHSABS 0.8 10/17/2015 1330   MONOABS 0.1  10/17/2015 1330   EOSABS 0.0 10/17/2015 1330   BASOSABS 0.0 10/17/2015 1330    CMP     Component Value Date/Time   NA 141 10/19/2015 0759   K 3.8 10/19/2015 0759   CL 107 10/19/2015 0759   CO2 27 10/19/2015 0759   GLUCOSE 115* 10/19/2015 0759   BUN 13 10/19/2015 0759   CREATININE 1.00 10/19/2015 0759   CALCIUM 8.6* 10/19/2015 0759   PROT 7.7 10/17/2015 1330   ALBUMIN 4.3 10/17/2015 1330   AST 19 10/17/2015 1330   ALT 17 10/17/2015 1330   ALKPHOS 70 10/17/2015 1330   BILITOT 1.4* 10/17/2015 1330   GFRNONAA >60 10/19/2015 0759   GFRAA >60 10/19/2015 0759    Lab Results  Component Value Date   HGBA1C 5.7* 10/19/2015     Ct Angio Head W/cm &/or Wo Cm  10/18/2015  CLINICAL DATA:  Acute onset RIGHT extremity numbness and weakness. History of hypertension and diabetes. Currently homeless. EXAM:  CT ANGIOGRAPHY HEAD AND NECK TECHNIQUE: Multidetector CT imaging of the head and neck was performed using the standard protocol during bolus administration of intravenous contrast. Multiplanar CT image reconstructions and MIPs were obtained to evaluate the vascular anatomy. Carotid stenosis measurements (when applicable) are obtained utilizing NASCET criteria, using the distal internal carotid diameter as the denominator. COMPARISON:  MRI of the head March 11th 2017 FINDINGS: CT HEAD No abnormal intracranial enhancement. CTA NECK Aortic arch: Normal appearance of the thoracic arch, normal branch pattern. The origins of the innominate, left Common carotid artery and subclavian artery are widely patent. Right carotid system: Common carotid artery is widely patent, coursing in a straight line fashion. Normal appearance of the carotid bifurcation without hemodynamically significant stenosis by NASCET criteria. Minimal eccentric calcific atherosclerosis. Normal appearance of the included internal carotid artery. Left carotid system: Common carotid artery is widely patent, coursing in a straight line  fashion. Normal appearance of the carotid bifurcation without hemodynamically significant stenosis by NASCET criteria. Minimal eccentric calcific atherosclerosis. Normal appearance of the included internal carotid artery. Vertebral arteries:Left vertebral artery is dominant. Normal appearance of the vertebral arteries, which appear widely patent. Skeleton: No acute osseous process though bone windows have not been submitted. Moderate degenerative change of the cervical spine. Multiple dental caries, periapical lucency/abscess. Other neck: Soft tissues of the neck are nonacute though, not tailored for evaluation. Small RIGHT laryngocele. CTA HEAD Anterior circulation: Normal appearance of the cervical internal carotid arteries, petrous, cavernous and supra clinoid internal carotid arteries. Widely patent anterior communicating artery. Normal appearance of the anterior and middle cerebral arteries. Posterior circulation: Normal appearance of the vertebral arteries, vertebrobasilar junction and basilar artery, as well as main branch vessels. Normal appearance of the posterior cerebral arteries. Mild luminal regularity of the mid to distal cerebral arteries. No large vessel occlusion, hemodynamically significant stenosis, dissection, contrast extravasation or aneurysm within the anterior nor posterior circulation. IMPRESSION: CTA NECK: Minimal atherosclerosis without hemodynamically significant stenosis or acute vascular process. Poor dentition. CTA HEAD: No emergent large vessel occlusion or high-grade stenosis. Mild luminal irregularity of the mid to distal vessels compatible with atherosclerosis. Electronically Signed   By: Elon Alas M.D.   On: 10/18/2015 06:19   Ct Head Wo Contrast  10/17/2015  CLINICAL DATA:  55 year old male with right-sided numbness/ weakness EXAM: CT HEAD WITHOUT CONTRAST TECHNIQUE: Contiguous axial images were obtained from the base of the skull through the vertex without intravenous  contrast. COMPARISON:  Prior brain MRI 04/25/2015 FINDINGS: Negative for acute intracranial hemorrhage, acute infarction, mass, mass effect, hydrocephalus or midline shift. Gray-white differentiation is preserved throughout. Similar pattern of advanced periventricular, subcortical and deep white matter hypoattenuation which remains nonspecific but most consistent with chronic microvascular ischemic white matter disease. No focal soft tissue or calvarial abnormality. Normal aeration of mastoid air cells and visualized paranasal sinuses. IMPRESSION: 1. No acute intracranial abnormality. 2. 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 Electronically Signed   By: Jacqulynn Cadet M.D.   On: 10/17/2015 14:14   Ct Angio Neck W/cm &/or Wo/cm  10/18/2015  CLINICAL DATA:  Acute onset RIGHT extremity numbness and weakness. History of hypertension and diabetes. Currently homeless. EXAM: CT ANGIOGRAPHY HEAD AND NECK TECHNIQUE: Multidetector CT imaging of the head and neck was performed using the standard protocol during bolus administration of intravenous contrast. Multiplanar CT image reconstructions and MIPs were obtained to evaluate the vascular anatomy. Carotid stenosis measurements (when applicable) are obtained utilizing NASCET criteria, using the distal  internal carotid diameter as the denominator. COMPARISON:  MRI of the head March 11th 2017 FINDINGS: CT HEAD No abnormal intracranial enhancement. CTA NECK Aortic arch: Normal appearance of the thoracic arch, normal branch pattern. The origins of the innominate, left Common carotid artery and subclavian artery are widely patent. Right carotid system: Common carotid artery is widely patent, coursing in a straight line fashion. Normal appearance of the carotid bifurcation without hemodynamically significant stenosis by NASCET criteria. Minimal eccentric calcific atherosclerosis. Normal appearance of the included internal carotid  artery. Left carotid system: Common carotid artery is widely patent, coursing in a straight line fashion. Normal appearance of the carotid bifurcation without hemodynamically significant stenosis by NASCET criteria. Minimal eccentric calcific atherosclerosis. Normal appearance of the included internal carotid artery. Vertebral arteries:Left vertebral artery is dominant. Normal appearance of the vertebral arteries, which appear widely patent. Skeleton: No acute osseous process though bone windows have not been submitted. Moderate degenerative change of the cervical spine. Multiple dental caries, periapical lucency/abscess. Other neck: Soft tissues of the neck are nonacute though, not tailored for evaluation. Small RIGHT laryngocele. CTA HEAD Anterior circulation: Normal appearance of the cervical internal carotid arteries, petrous, cavernous and supra clinoid internal carotid arteries. Widely patent anterior communicating artery. Normal appearance of the anterior and middle cerebral arteries. Posterior circulation: Normal appearance of the vertebral arteries, vertebrobasilar junction and basilar artery, as well as main branch vessels. Normal appearance of the posterior cerebral arteries. Mild luminal regularity of the mid to distal cerebral arteries. No large vessel occlusion, hemodynamically significant stenosis, dissection, contrast extravasation or aneurysm within the anterior nor posterior circulation. IMPRESSION: CTA NECK: Minimal atherosclerosis without hemodynamically significant stenosis or acute vascular process. Poor dentition. CTA HEAD: No emergent large vessel occlusion or high-grade stenosis. Mild luminal irregularity of the mid to distal vessels compatible with atherosclerosis. Electronically Signed   By: Elon Alas M.D.   On: 10/18/2015 06:19   Mr Brain Wo Contrast  10/17/2015  CLINICAL DATA:  Right arm and leg weakness and numbness for greater than 1 week. History of hypertension and  diabetes. EXAM: MRI HEAD WITHOUT CONTRAST TECHNIQUE: Multiplanar, multiecho pulse sequences of the brain and surrounding structures were obtained without intravenous contrast. COMPARISON:  Head CT earlier today and MRI 04/25/2015 FINDINGS: There is a linear focus of abnormal diffusion signal in the inferior left paramedian pons with normal ADC and new regional T2 hyperintensity, likely reflecting an early subacute infarct given the patient's symptom duration. There is no evidence of recent supratentorial infarct. There is no evidence of intracranial hemorrhage, mass, midline shift, or extra-axial fluid collection. Patchy T2 hyperintensities throughout the subcortical and deep cerebral white matter bilaterally are similar to the prior MRI and compatible with moderate chronic small vessel ischemic disease, greatly advanced for age. Orbits are unremarkable. Paranasal sinuses and mastoid air cells are clear. Major intracranial vascular flow voids are preserved. IMPRESSION: 1. Subacute left pontine infarct. 2. Moderate chronic small vessel ischemic disease. Electronically Signed   By: Logan Bores M.D.   On: 10/17/2015 19:23    Not all labs, radiology exams or other studies done during hospitalization come through on my EPIC note; however they are reviewed by me.    Assessment and Plan  CVA (cerebral infarction) Acute/subacute left pontine infarct Risk factors include uncontrolled hypertension, diabetes mellitus, ongoing tobacco use and hyperlipidemia. MRI brain shows subacute left pontine infarct. CT angiogram of the head and neck shows no large vessel stenosis. 2-D echo shows EF of 55-60%.. LDL of 115.Added  statin. Patient started on full dose aspirin. Patient has residual mild right facial droop with diminished sensation and right hemiparesis. Seen by PT and recommends skilled nursing facility.  Follow up with stroke clinic as outpatient. SNF - OT/PT  Uncontrolled hypertension SNF - pt was sent to  SNF for permissive HTN, but his BP is a little too permissive so pt started on norvasc 5 mg daily  Diabetes mellitus type 2, controlled SNF - Not On any medications. A1c of 5.7; will monitor at intervals   Hyperlipidemia SNF - LDL was 115 so pt was started on lipitor 40 mg daily  Tobacco abuse SNF - discussed nicotine patch; no go; will cont efforts  Protein-calorie malnutrition, severe (HCC) SNF -will supplement diet   Time spent > 45 min;> 50% of time with patient was spent reviewing records, labs, tests and studies, counseling and developing plan of care  Hennie Duos, MD

## 2015-10-28 LAB — CBC AND DIFFERENTIAL
HCT: 45 % (ref 41–53)
Hemoglobin: 14.2 g/dL (ref 13.5–17.5)
PLATELETS: 188 10*3/uL (ref 150–399)
WBC: 3.6 10*3/mL

## 2015-10-28 LAB — BASIC METABOLIC PANEL
BUN: 9 mg/dL (ref 4–21)
CREATININE: 1 mg/dL (ref 0.6–1.3)
Glucose: 82 mg/dL
Potassium: 4.4 mmol/L (ref 3.4–5.3)
Sodium: 140 mmol/L (ref 137–147)

## 2015-10-29 ENCOUNTER — Ambulatory Visit: Payer: Self-pay | Admitting: Cardiothoracic Surgery

## 2015-10-29 ENCOUNTER — Other Ambulatory Visit: Payer: Self-pay

## 2015-11-04 NOTE — Progress Notes (Signed)
ERRONEOUS, ERRONEOUS

## 2015-11-08 ENCOUNTER — Encounter: Payer: Self-pay | Admitting: Internal Medicine

## 2015-11-08 NOTE — Assessment & Plan Note (Signed)
SNF - pt was sent to SNF for permissive HTN, but his BP is a little too permissive so pt started on norvasc 5 mg daily

## 2015-11-08 NOTE — Assessment & Plan Note (Signed)
Acute/subacute left pontine infarct Risk factors include uncontrolled hypertension, diabetes mellitus, ongoing tobacco use and hyperlipidemia. MRI brain shows subacute left pontine infarct. CT angiogram of the head and neck shows no large vessel stenosis. 2-D echo shows EF of 55-60%.. LDL of 115.Added statin. Patient started on full dose aspirin. Patient has residual mild right facial droop with diminished sensation and right hemiparesis. Seen by PT and recommends skilled nursing facility.  Follow up with stroke clinic as outpatient. SNF - OT/PT

## 2015-11-08 NOTE — Assessment & Plan Note (Signed)
SNF - discussed nicotine patch; no go; will cont efforts

## 2015-11-08 NOTE — Assessment & Plan Note (Signed)
SNF - LDL was 115 so pt was started on lipitor 40 mg daily

## 2015-11-08 NOTE — Assessment & Plan Note (Signed)
SNF - Not On any medications. A1c of 5.7; will monitor at intervals

## 2015-11-08 NOTE — Assessment & Plan Note (Signed)
SNF -will supplement diet

## 2015-11-24 ENCOUNTER — Encounter: Payer: Self-pay | Admitting: Adult Health

## 2015-11-24 ENCOUNTER — Non-Acute Institutional Stay (SKILLED_NURSING_FACILITY): Payer: Self-pay | Admitting: Adult Health

## 2015-11-24 DIAGNOSIS — M6289 Other specified disorders of muscle: Secondary | ICD-10-CM

## 2015-11-24 DIAGNOSIS — K5901 Slow transit constipation: Secondary | ICD-10-CM

## 2015-11-24 DIAGNOSIS — E43 Unspecified severe protein-calorie malnutrition: Secondary | ICD-10-CM

## 2015-11-24 DIAGNOSIS — I6302 Cerebral infarction due to thrombosis of basilar artery: Secondary | ICD-10-CM

## 2015-11-24 DIAGNOSIS — R531 Weakness: Secondary | ICD-10-CM

## 2015-11-24 DIAGNOSIS — I10 Essential (primary) hypertension: Secondary | ICD-10-CM

## 2015-11-24 DIAGNOSIS — E785 Hyperlipidemia, unspecified: Secondary | ICD-10-CM

## 2015-11-24 NOTE — Progress Notes (Signed)
Patient ID: Javier Rivera, male   DOB: 09-28-1960, 55 y.o.   MRN: [id number]   Facility:  [facility]       Allergies  Allergen Reactions  . Clonidine Derivatives     Pt reported the sweats and shakes  . Lisinopril Palpitations    "made me real sick, made my heart rate scatter"    Chief Complaint  Patient presents with  . Medical Management of Chronic Issues    Follow Up    HPI:  He is a resident of this facility being seen for the management of his chronic illnesses. He is unable to fully participate in the hpi or ros fully. The therapy staff report that he has said that he has severe constipation for the past year. Per staff he is "pooping rocks". His blood pressure is not adequately controlled.    Past Medical History  Diagnosis Date  . Diabetes mellitus without complication ([place])   . Hypertension   . Renal disorder     pt stated that masses were found on his kidneys  . Renal insufficiency     Past Surgical History  Procedure Laterality Date  . Knee surgery      bilateral    VITAL SIGNS BP 150/96 mmHg  Pulse 58  Temp(Src) 97.6 F (36.4 C) (Oral)  Resp 20  SpO2 96%  Patient's Medications  New Prescriptions   No medications on file  Previous Medications   AMLODIPINE (NORVASC) 5 MG TABLET    Take 5 mg by mouth daily.   ASPIRIN EC 325 MG EC TABLET    Take 1 tablet (325 mg total) by mouth daily.   ATORVASTATIN (LIPITOR) 40 MG TABLET    Take 1 tablet (40 mg total) by mouth daily at 6 PM.   FEEDING SUPPLEMENT, ENSURE ENLIVE, (ENSURE ENLIVE) LIQD    Take 237 mLs by mouth 2 (two) times daily between meals.   MAGNESIUM HYDROXIDE (MILK OF MAGNESIA) 400 MG/5ML SUSPENSION    Take 30 mLs by mouth daily as needed for mild constipation.   POLYETHYLENE GLYCOL (MIRALAX / GLYCOLAX) PACKET    Take 17 g by mouth daily.   SENNA (SENOKOT) 8.6 MG TABLET    Take 2 tablets by mouth at bedtime.  Modified Medications   No medications on file  Discontinued Medications   No  medications on file     SIGNIFICANT DIAGNOSTIC EXAMS  10-17-15: ct of head: 1. No acute intracranial abnormality. 2. 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  10-17-15: mri of brain; 1. Subacute left pontine infarct. 2. Moderate chronic small vessel ischemic disease.   10-18-15: ct angio of neck and head: CTA NECK: Minimal atherosclerosis without hemodynamically significant stenosis or acute vascular process. Poor dentition.  CTA HEAD: No emergent large vessel occlusion or high-grade stenosis. Mild luminal irregularity of the mid to distal vessels compatible with atherosclerosis.   10-19-15 2-d echo: Left ventricle: The cavity size was normal. Systolic function was normal. The estimated ejection fraction was in the range of 55% to 60%. Wall motion was normal; there were no regional wall motion abnormalities. - Aortic valve: There was mild regurgitation. - Aorta: Consider repeat CT scan to measure aortic root (prior 04/2015). Aortic root dimension: 55 mm (ED). - Ascending aorta: The ascending aorta was severely dilated. Impressions: - No cardiac source of emboli was indentified.  ;LABS REVIEWED:   10-17-15; wbc 3.9; hgb 17.4; hct 50.6; mcv 88.0 ;plt 172; glucose 234; bun 27; creat 1.16; k+  4.4; na++141; liver normal albumin 4.3 10-18-15: vit B12: 474; HIV: nr; RPR: nr 10-19-15: wbc chol 164; ldl 115; trig 52; hdl 39; hgb a1c 5.7 10-28-15: wbc 3.6; hgb 14.2; hct 44.8; mcv 88.5; plt 188; glucose 82; bun 9.2; creat 1.00; k+ 4.4; na++140    Review of Systems  Unable to perform ROS: medical condition    Physical Exam  Constitutional: No distress.  Eyes: Conjunctivae are normal.  Neck: Neck supple. No JVD present. No thyromegaly present.  Cardiovascular: Normal rate, regular rhythm and intact distal pulses.   Respiratory: Effort normal and breath sounds normal. No respiratory distress. He has no wheezes.  GI: Soft. He exhibits no distension. There is no  tenderness.  Bowel sounds hypoactive   Musculoskeletal: He exhibits no edema.  Able to move all extremities  Has right side weakness present   Lymphadenopathy:    He has no cervical adenopathy.  Neurological: He is alert.  Skin: Skin is warm and dry. He is not diaphoretic.  Psychiatric: He has a normal mood and affect.      ASSESSMENT/ PLAN:  1. Hypertension: will increase norvasc to 10 mg daily will have nursing check blood pressure twice daily for one week and will report.   2. CVA: is neurologically without change; will continue asa 325 mg daily will continue therapy as directed  3. Dyslipidemia:  Will continue lipitor 40 mg daily ldl is 115  4. Malnutrition: will continue supplements per facility protocol  5. Constipation; will continue miralax daily; senna s tabs daily and will begin lactulose 30 cc twice daily and will monitor     Ok Edwards NP [facility]  Contact [phone] Monday through Friday 8am- 5pm  After hours call [phone]

## 2015-11-25 ENCOUNTER — Encounter: Payer: Self-pay | Admitting: Adult Health

## 2015-11-25 DIAGNOSIS — K5901 Slow transit constipation: Secondary | ICD-10-CM | POA: Insufficient documentation

## 2015-11-25 MED ORDER — LACTULOSE 20 GM/30ML PO SOLN: 30.0000 mL | Freq: Two times a day (BID) | ORAL | Status: DC

## 2015-11-25 NOTE — Progress Notes (Signed)
This encounter was created in error - please disregard.

## 2015-12-02 ENCOUNTER — Non-Acute Institutional Stay (SKILLED_NURSING_FACILITY): Payer: Self-pay | Admitting: Adult Health

## 2015-12-02 ENCOUNTER — Encounter: Payer: Self-pay | Admitting: Adult Health

## 2015-12-02 DIAGNOSIS — F1410 Cocaine abuse, uncomplicated: Secondary | ICD-10-CM

## 2015-12-02 DIAGNOSIS — E43 Unspecified severe protein-calorie malnutrition: Secondary | ICD-10-CM

## 2015-12-02 DIAGNOSIS — I10 Essential (primary) hypertension: Secondary | ICD-10-CM

## 2015-12-02 DIAGNOSIS — Z720 Tobacco use: Secondary | ICD-10-CM

## 2015-12-02 NOTE — Progress Notes (Signed)
Patient ID: Javier Rivera, male   DOB: March 20, 1961, 55 y.o.   MRN: [id number]    Facility:  [facility]       Allergies  Allergen Reactions  . Clonidine Derivatives     Pt reported the sweats and shakes  . Lisinopril Palpitations    "made me real sick, made my heart rate scatter"    Chief Complaint  Patient presents with  . Discharge Note    Discharge from facility    HPI:  He is being discharged to home. He will not need home health or dme. He will need his prescriptions to be written and will need medical follow up.  He tells [place] he is moving in with a friend. He will not give an address; stating he will tell the staff in the AM.  I have spoken with social services; the facility has done all it can to provide him with a safe discharge. Will contact APS upon discharge in order to help with his safety.   Past Medical History  Diagnosis Date  . Diabetes mellitus without complication ([place])   . Hypertension   . Renal disorder     pt stated that masses were found on his kidneys  . Renal insufficiency     Past Surgical History  Procedure Laterality Date  . Knee surgery      bilateral    VITAL SIGNS There were no vitals taken for this visit.  Patient's Medications  New Prescriptions   No medications on file  Previous Medications   AMLODIPINE (NORVASC) 5 MG TABLET    Take 5 mg by mouth daily.   ASPIRIN EC 325 MG EC TABLET    Take 1 tablet (325 mg total) by mouth daily.   ATORVASTATIN (LIPITOR) 40 MG TABLET    Take 1 tablet (40 mg total) by mouth daily at 6 PM.   FEEDING SUPPLEMENT, ENSURE ENLIVE, (ENSURE ENLIVE) LIQD    Take 237 mLs by mouth 2 (two) times daily between meals.   LACTULOSE 20 GM/30ML SOLN    Take 30 mLs (20 g total) by mouth 2 (two) times daily.   MAGNESIUM HYDROXIDE (MILK OF MAGNESIA) 400 MG/5ML SUSPENSION    Take 30 mLs by mouth daily as needed for mild constipation.   POLYETHYLENE GLYCOL (MIRALAX / GLYCOLAX) PACKET    Take 17 g by mouth daily.   SENNA  (SENOKOT) 8.6 MG TABLET    Take 2 tablets by mouth at bedtime.  Modified Medications   No medications on file  Discontinued Medications   No medications on file     SIGNIFICANT DIAGNOSTIC EXAMS   10-17-15: ct of head: 1. No acute intracranial abnormality. 2. 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  10-17-15: mri of brain; 1. Subacute left pontine infarct. 2. Moderate chronic small vessel ischemic disease.   10-18-15: ct angio of neck and head: CTA NECK: Minimal atherosclerosis without hemodynamically significant stenosis or acute vascular process. Poor dentition.  CTA HEAD: No emergent large vessel occlusion or high-grade stenosis. Mild luminal irregularity of the mid to distal vessels compatible with atherosclerosis.   10-19-15 2-d echo: Left ventricle: The cavity size was normal. Systolic function was normal. The estimated ejection fraction was in the range of 55% to 60%. Wall motion was normal; there were no regional wall motion abnormalities. - Aortic valve: There was mild regurgitation. - Aorta: Consider repeat CT scan to measure aortic root (prior 04/2015). Aortic root dimension: 55 mm (ED). - Ascending aorta:  The ascending aorta was severely dilated. Impressions: - No cardiac source of emboli was indentified.  ;LABS REVIEWED:   10-17-15; wbc 3.9; hgb 17.4; hct 50.6; mcv 88.0 ;plt 172; glucose 234; bun 27; creat 1.16; k+ 4.4; na++141; liver normal albumin 4.3 10-18-15: vit B12: 474; HIV: nr; RPR: nr 10-19-15: wbc chol 164; ldl 115; trig 52; hdl 39; hgb a1c 5.7 10-28-15: wbc 3.6; hgb 14.2; hct 44.8; mcv 88.5; plt 188; glucose 82; bun 9.2; creat 1.00; k+ 4.4; na++140     Review of Systems  Constitutional: Negative for malaise/fatigue.  Respiratory: Negative for cough and shortness of breath.   Cardiovascular: Negative for chest pain, palpitations and leg swelling.  Gastrointestinal: Negative for heartburn, abdominal pain and constipation.    Musculoskeletal: Negative for myalgias, back pain and joint pain.  Skin: Negative.   Neurological: Negative for dizziness.  Psychiatric/Behavioral: The patient is not nervous/anxious.     Physical Exam  Constitutional: No distress.  Eyes: Conjunctivae are normal.  Neck: Neck supple. No JVD present. No thyromegaly present.  Cardiovascular: Normal rate, regular rhythm and intact distal pulses.   Respiratory: Effort normal and breath sounds normal. No respiratory distress. He has no wheezes.  GI: Soft. He exhibits no distension. There is no tenderness.   Musculoskeletal: He exhibits no edema.  Able to move all extremities   Lymphadenopathy:    He has no cervical adenopathy.  Neurological: He is alert.  Skin: Skin is warm and dry. He is not diaphoretic.  Psychiatric: He has a normal mood and affect.      ASSESSMENT/ PLAN:  Will discharge him to home. He will not need home health and will not need dme. His prescriptions have been written for a 30 day supply of his medications. The facility is to setup his medical follow up for the next 1-2 weeks after discharge.    Time spent with patient 40  minutes >50% time spent counseling; reviewing medical record; tests; labs; and developing future plan of care   Ok Edwards NP [facility]  Contact [phone] Monday through Friday 8am- 5pm  After hours call [phone]

## 2015-12-04 ENCOUNTER — Telehealth: Payer: Self-pay | Admitting: *Deleted

## 2015-12-04 NOTE — Telephone Encounter (Signed)
Called patient and spoke to patients sister to r/s appointment  And cta chest from 10/29/15, sister will get in touch with patient and have him to call [facility].  Patient is released from [facility]

## 2015-12-21 ENCOUNTER — Ambulatory Visit: Payer: Self-pay | Admitting: Family Medicine

## 2016-01-06 ENCOUNTER — Ambulatory Visit: Payer: MEDICAID | Admitting: Neurology

## 2016-01-11 ENCOUNTER — Ambulatory Visit: Payer: Self-pay | Admitting: Family Medicine

## 2016-01-22 ENCOUNTER — Emergency Department (HOSPITAL_COMMUNITY)
Admission: EM | Admit: 2016-01-22 | Discharge: 2016-01-23 | Disposition: A | Discharge status: Home / Self Care | Payer: MEDICAID | Attending: Emergency Medicine | Admitting: Emergency Medicine

## 2016-01-22 ENCOUNTER — Encounter (HOSPITAL_COMMUNITY): Payer: Self-pay | Admitting: Emergency Medicine

## 2016-01-22 DIAGNOSIS — I10 Essential (primary) hypertension: Secondary | ICD-10-CM | POA: Insufficient documentation

## 2016-01-22 DIAGNOSIS — N3091 Cystitis, unspecified with hematuria: Secondary | ICD-10-CM | POA: Insufficient documentation

## 2016-01-22 DIAGNOSIS — Z79899 Other long term (current) drug therapy: Secondary | ICD-10-CM | POA: Insufficient documentation

## 2016-01-22 DIAGNOSIS — Z7982 Long term (current) use of aspirin: Secondary | ICD-10-CM | POA: Insufficient documentation

## 2016-01-22 DIAGNOSIS — E119 Type 2 diabetes mellitus without complications: Secondary | ICD-10-CM | POA: Insufficient documentation

## 2016-01-22 DIAGNOSIS — F17210 Nicotine dependence, cigarettes, uncomplicated: Secondary | ICD-10-CM | POA: Insufficient documentation

## 2016-01-22 NOTE — ED Notes (Signed)
Patient presents for hematuria, dysuria, urinary frequency and lower back pain x1 day. Denies fever, N/V/D. Last BM >1week.

## 2016-01-23 ENCOUNTER — Emergency Department (HOSPITAL_COMMUNITY): Payer: MEDICAID

## 2016-01-23 LAB — BASIC METABOLIC PANEL
ANION GAP: 6 (ref 5–15)
BUN: 23 mg/dL — ABNORMAL HIGH (ref 6–20)
CALCIUM: 9.2 mg/dL (ref 8.9–10.3)
CO2: 25 mmol/L (ref 22–32)
Chloride: 111 mmol/L (ref 101–111)
Creatinine, Ser: 1.05 mg/dL (ref 0.61–1.24)
Glucose, Bld: 71 mg/dL (ref 65–99)
POTASSIUM: 4.3 mmol/L (ref 3.5–5.1)
Sodium: 142 mmol/L (ref 135–145)

## 2016-01-23 LAB — CBC WITH DIFFERENTIAL/PLATELET
BASOS ABS: 0 10*3/uL (ref 0.0–0.1)
BASOS PCT: 0 %
Eosinophils Absolute: 0 10*3/uL (ref 0.0–0.7)
Eosinophils Relative: 1 %
HCT: 46.5 % (ref 39.0–52.0)
HEMOGLOBIN: 15.3 g/dL (ref 13.0–17.0)
Lymphocytes Relative: 40 %
Lymphs Abs: 2 10*3/uL (ref 0.7–4.0)
MCH: 29.9 pg (ref 26.0–34.0)
MCHC: 32.9 g/dL (ref 30.0–36.0)
MCV: 91 fL (ref 78.0–100.0)
Monocytes Absolute: 0.5 10*3/uL (ref 0.1–1.0)
Monocytes Relative: 10 %
NEUTROS ABS: 2.5 10*3/uL (ref 1.7–7.7)
NEUTROS PCT: 49 %
PLATELETS: 202 10*3/uL (ref 150–400)
RBC: 5.11 MIL/uL (ref 4.22–5.81)
RDW: 13.7 % (ref 11.5–15.5)
WBC: 5 10*3/uL (ref 4.0–10.5)

## 2016-01-23 LAB — URINALYSIS, ROUTINE W REFLEX MICROSCOPIC
Bilirubin Urine: NEGATIVE
Glucose, UA: 100 mg/dL — AB
KETONES UR: NEGATIVE mg/dL
NITRITE: POSITIVE — AB
PH: 5.5 (ref 5.0–8.0)
PROTEIN: NEGATIVE mg/dL
Specific Gravity, Urine: 1.03 (ref 1.005–1.030)

## 2016-01-23 LAB — URINE MICROSCOPIC-ADD ON

## 2016-01-23 MED ORDER — CEPHALEXIN 500 MG PO CAPS: 500.0000 mg | ORAL_CAPSULE | Freq: Four times a day (QID) | ORAL | Status: DC

## 2016-01-23 NOTE — ED Notes (Signed)
Patient transported to CT 

## 2016-01-23 NOTE — ED Notes (Signed)
MD notified of pt.'s latest BP , ok for discharge.

## 2016-01-23 NOTE — Discharge Instructions (Signed)
Keflex as prescribed.  Follow-up with your primary Dr. if not improving in the next several days, and return to the ER if you develop worsening pain, high fever, or other new and concerning symptoms.   Urinary Tract Infection Urinary tract infections (UTIs) can develop anywhere along your urinary tract. Your urinary tract is your body's drainage system for removing wastes and extra water. Your urinary tract includes two kidneys, two ureters, a bladder, and a urethra. Your kidneys are a pair of bean-shaped organs. Each kidney is about the size of your fist. They are located below your ribs, one on each side of your spine. CAUSES Infections are caused by microbes, which are microscopic organisms, including fungi, viruses, and bacteria. These organisms are so small that they can only be seen through a microscope. Bacteria are the microbes that most commonly cause UTIs. SYMPTOMS  Symptoms of UTIs may vary by age and gender of the patient and by the location of the infection. Symptoms in young women typically include a frequent and intense urge to urinate and a painful, burning feeling in the bladder or urethra during urination. Older women and men are more likely to be tired, shaky, and weak and have muscle aches and abdominal pain. A fever may mean the infection is in your kidneys. Other symptoms of a kidney infection include pain in your back or sides below the ribs, nausea, and vomiting. DIAGNOSIS To diagnose a UTI, your caregiver will ask you about your symptoms. Your caregiver will also ask you to provide a urine sample. The urine sample will be tested for bacteria and white blood cells. White blood cells are made by your body to help fight infection. TREATMENT  Typically, UTIs can be treated with medication. Because most UTIs are caused by a bacterial infection, they usually can be treated with the use of antibiotics. The choice of antibiotic and length of treatment depend on your symptoms and the  type of bacteria causing your infection. HOME CARE INSTRUCTIONS  If you were prescribed antibiotics, take them exactly as your caregiver instructs you. Finish the medication even if you feel better after you have only taken some of the medication.  Drink enough water and fluids to keep your urine clear or pale yellow.  Avoid caffeine, tea, and carbonated beverages. They tend to irritate your bladder.  Empty your bladder often. Avoid holding urine for long periods of time.  Empty your bladder before and after sexual intercourse.  After a bowel movement, women should cleanse from front to back. Use each tissue only once. SEEK MEDICAL CARE IF:   You have back pain.  You develop a fever.  Your symptoms do not begin to resolve within 3 days. SEEK IMMEDIATE MEDICAL CARE IF:   You have severe back pain or lower abdominal pain.  You develop chills.  You have nausea or vomiting.  You have continued burning or discomfort with urination. MAKE SURE YOU:   Understand these instructions.  Will watch your condition.  Will get help right away if you are not doing well or get worse.   This information is not intended to replace advice given to you by your health care provider. Make sure you discuss any questions you have with your health care provider.   Document Released: 05/04/2005 Document Revised: 04/15/2015 Document Reviewed: 09/02/2011 Elsevier Interactive Patient Education [employer].

## 2016-01-23 NOTE — ED Notes (Signed)
MD at bedside. 

## 2016-01-23 NOTE — ED Notes (Signed)
Pt family states that the pt left his prescription and DC paperwork in the lobby when he left; DC instructions reprinted and provided to family; per Dr Maryan Rued ok to call Rx in; Keflex 500mg  4 times daily 28 tabs with no refills called to the [facility] on [address] per family

## 2016-01-23 NOTE — ED Provider Notes (Signed)
CSN: [id number]     Arrival date & time 01/22/16  2138 History  By signing my name below, I, Javier Rivera, attest that this documentation has been prepared under the direction and in the presence of Veryl Speak, MD. Electronically Signed: Judithann Sauger, ED Scribe. 01/23/2016. 1:07 AM.    Chief Complaint  Patient presents with  . Hematuria   Patient is a 55 y.o. male presenting with hematuria. The history is provided by the patient. No language interpreter was used.  Hematuria This is a new problem. The current episode started yesterday. The problem has not changed since onset.Nothing aggravates the symptoms. Nothing relieves the symptoms. He has tried nothing for the symptoms.   HPI Comments: [name] is a 55 y.o. male with a hx of DM, HTN, renal disorder (masses on kidneys), and renal insufficiency who presents to the Emergency Department complaining of sudden onset of moderate hematuria onset yesterday. He reports associated lower back pain, urinary urgency, and constipation. He states that he has been able to eat appropriately but has not had adequate BMs. He adds that he has abdominal pain while having a BM. No alleviating factors noted. Pt has not tried any medications PTA. He reports a hx of renal disorder, explaining that the masses on his kidneys are currently being monitored but no current treatment. No fever or n/v/d.    Past Medical History  Diagnosis Date  . Diabetes mellitus without complication ([place])   . Hypertension   . Renal disorder     pt stated that masses were found on his kidneys  . Renal insufficiency    Past Surgical History  Procedure Laterality Date  . Knee surgery      bilateral   Family History  Problem Relation Age of Onset  . Diabetes Mellitus II Mother   . Hypertension Mother   . Diabetes Mellitus II Sister   . Hypertension Sister    Social History  Substance Use Topics  . Smoking status: Current Every Day Smoker -- 0.25 packs/day for  20 years    Types: Cigarettes  . Smokeless tobacco: Never Used  . Alcohol Use: No    Review of Systems  Genitourinary: Positive for hematuria.      Allergies  Clonidine derivatives and Lisinopril  Home Medications   Prior to Admission medications   Medication Sig Start Date End Date Taking? Authorizing Provider  amLODipine (NORVASC) 5 MG tablet Take 5 mg by mouth daily.   Yes Historical Provider, MD  senna (SENOKOT) 8.6 MG tablet Take 2 tablets by mouth at bedtime.   Yes Historical Provider, MD  aspirin EC 325 MG EC tablet Take 1 tablet (325 mg total) by mouth daily. 10/20/15   Nishant Dhungel, MD  atorvastatin (LIPITOR) 40 MG tablet Take 1 tablet (40 mg total) by mouth daily at 6 PM. 10/20/15   Nishant Dhungel, MD  feeding supplement, ENSURE ENLIVE, (ENSURE ENLIVE) LIQD Take 237 mLs by mouth 2 (two) times daily between meals. Patient not taking: Reported on 01/23/2016 10/20/15   Nishant Dhungel, MD  Lactulose 20 GM/30ML SOLN Take 30 mLs (20 g total) by mouth 2 (two) times daily. Patient not taking: Reported on 01/23/2016 11/25/15   Gerlene Fee, NP  polyethylene glycol (MIRALAX / GLYCOLAX) packet Take 17 g by mouth daily. Patient not taking: Reported on 01/23/2016 10/20/15   Nishant Dhungel, MD   BP 130/98 mmHg  Pulse 123  Temp(Src) 98.7 F (37.1 C) (Oral)  Resp 18  SpO2 99% Physical  Exam  Constitutional: He is oriented to person, place, and time. He appears well-developed and well-nourished.  HENT:  Head: Normocephalic and atraumatic.  Eyes: EOM are normal.  Neck: Normal range of motion.  Cardiovascular: Normal rate, regular rhythm and intact distal pulses.   No murmur heard. Pulmonary/Chest: Effort normal and breath sounds normal. No respiratory distress. He has no wheezes. He has no rales.  Abdominal: Soft. He exhibits no distension. There is no tenderness.  Musculoskeletal: Normal range of motion.  Neurological: He is alert and oriented to person, place, and time.   Skin: Skin is warm and dry.  Psychiatric: He has a normal mood and affect. Judgment normal.  Nursing note and vitals reviewed.   ED Course  Procedures (including critical care time) DIAGNOSTIC STUDIES: Oxygen Saturation is 99% on RA, normal by my interpretation.    COORDINATION OF CARE: 1:05 AM- Pt advised of plan for treatment and pt agrees. He will receive CT renal stone study, UA, and lab work for further evaluation.    Labs Review Labs Reviewed  URINALYSIS, ROUTINE W REFLEX MICROSCOPIC (NOT AT [facility])  BASIC METABOLIC PANEL  CBC WITH DIFFERENTIAL/PLATELET    Imaging Review No results found.   Veryl Speak, MD has personally reviewed and evaluated these images and lab results as part of his medical decision-making.    MDM   Final diagnoses:  None    CT scan reveals no change in the character of his renal abnormalities. His urinalysis does reveal infection. This will be treated with antibiotics and when necessary return.  I personally performed the services described in this documentation, which was scribed in my presence. The recorded information has been reviewed and is accurate.       Veryl Speak, MD 01/23/16 [phone]

## 2016-02-02 ENCOUNTER — Other Ambulatory Visit: Payer: Self-pay

## 2016-02-02 ENCOUNTER — Emergency Department (HOSPITAL_COMMUNITY)
Admission: EM | Admit: 2016-02-02 | Discharge: 2016-02-05 | Disposition: A | Discharge status: Home / Self Care | Payer: MEDICAID | Attending: Emergency Medicine | Admitting: Emergency Medicine

## 2016-02-02 ENCOUNTER — Encounter (HOSPITAL_COMMUNITY): Payer: Self-pay

## 2016-02-02 DIAGNOSIS — Z79899 Other long term (current) drug therapy: Secondary | ICD-10-CM | POA: Insufficient documentation

## 2016-02-02 DIAGNOSIS — R531 Weakness: Secondary | ICD-10-CM

## 2016-02-02 DIAGNOSIS — Z7982 Long term (current) use of aspirin: Secondary | ICD-10-CM | POA: Insufficient documentation

## 2016-02-02 DIAGNOSIS — E860 Dehydration: Secondary | ICD-10-CM | POA: Insufficient documentation

## 2016-02-02 DIAGNOSIS — N390 Urinary tract infection, site not specified: Secondary | ICD-10-CM | POA: Insufficient documentation

## 2016-02-02 DIAGNOSIS — R001 Bradycardia, unspecified: Secondary | ICD-10-CM | POA: Insufficient documentation

## 2016-02-02 DIAGNOSIS — F17210 Nicotine dependence, cigarettes, uncomplicated: Secondary | ICD-10-CM | POA: Insufficient documentation

## 2016-02-02 DIAGNOSIS — I10 Essential (primary) hypertension: Secondary | ICD-10-CM | POA: Insufficient documentation

## 2016-02-02 DIAGNOSIS — E119 Type 2 diabetes mellitus without complications: Secondary | ICD-10-CM | POA: Insufficient documentation

## 2016-02-02 HISTORY — DX: Cerebral infarction, unspecified: I63.9

## 2016-02-02 LAB — COMPREHENSIVE METABOLIC PANEL
ALBUMIN: 3.7 g/dL (ref 3.5–5.0)
ALT: 29 U/L (ref 17–63)
ANION GAP: 6 (ref 5–15)
AST: 19 U/L (ref 15–41)
Alkaline Phosphatase: 62 U/L (ref 38–126)
BUN: 14 mg/dL (ref 6–20)
CHLORIDE: 105 mmol/L (ref 101–111)
CO2: 28 mmol/L (ref 22–32)
Calcium: 9 mg/dL (ref 8.9–10.3)
Creatinine, Ser: 1.1 mg/dL (ref 0.61–1.24)
GFR calc Af Amer: >60 mL/min (ref >60)
GLUCOSE: 102 mg/dL — AB (ref 65–99)
POTASSIUM: 3.7 mmol/L (ref 3.5–5.1)
Sodium: 139 mmol/L (ref 135–145)
TOTAL PROTEIN: 6.8 g/dL (ref 6.5–8.1)
Total Bilirubin: 0.6 mg/dL (ref 0.3–1.2)

## 2016-02-02 LAB — URINALYSIS, ROUTINE W REFLEX MICROSCOPIC
Bilirubin Urine: NEGATIVE
Glucose, UA: 250 mg/dL — AB
Hgb urine dipstick: NEGATIVE
Ketones, ur: NEGATIVE mg/dL
Nitrite: POSITIVE — AB
Protein, ur: NEGATIVE mg/dL
Specific Gravity, Urine: 1.022 (ref 1.005–1.030)
pH: 5.5 (ref 5.0–8.0)

## 2016-02-02 LAB — URINE MICROSCOPIC-ADD ON: RBC / HPF: NONE SEEN RBC/hpf (ref 0–5)

## 2016-02-02 LAB — CBC WITH DIFFERENTIAL/PLATELET
BASOS ABS: 0 10*3/uL (ref 0.0–0.1)
Basophils Relative: 0 %
EOS PCT: 2 %
Eosinophils Absolute: 0.1 10*3/uL (ref 0.0–0.7)
HEMATOCRIT: 48.8 % (ref 39.0–52.0)
Hemoglobin: 16.4 g/dL (ref 13.0–17.0)
LYMPHS ABS: 1.5 10*3/uL (ref 0.7–4.0)
LYMPHS PCT: 48 %
MCH: 30.5 pg (ref 26.0–34.0)
MCHC: 33.6 g/dL (ref 30.0–36.0)
MCV: 90.9 fL (ref 78.0–100.0)
Monocytes Absolute: 0.3 10*3/uL (ref 0.1–1.0)
Monocytes Relative: 10 %
NEUTROS ABS: 1.2 10*3/uL — AB (ref 1.7–7.7)
Neutrophils Relative %: 40 %
PLATELETS: 162 10*3/uL (ref 150–400)
RBC: 5.37 MIL/uL (ref 4.22–5.81)
RDW: 13 % (ref 11.5–15.5)
WBC: 3.1 10*3/uL — AB (ref 4.0–10.5)

## 2016-02-02 LAB — CBG MONITORING, ED: GLUCOSE-CAPILLARY: 95 mg/dL (ref 65–99)

## 2016-02-02 LAB — POC OCCULT BLOOD, ED: Fecal Occult Bld: NEGATIVE

## 2016-02-02 MED ORDER — SENNOSIDES 8.6 MG PO TABS: 2.0000 | ORAL_TABLET | Freq: Every day | ORAL | Status: DC

## 2016-02-02 MED ORDER — SODIUM CHLORIDE 0.9 % IV BOLUS (SEPSIS)
2000.0000 mL | Freq: Once | INTRAVENOUS | Status: AC
  Administered 2016-02-02: 2000 mL via INTRAVENOUS

## 2016-02-02 MED ORDER — ATORVASTATIN CALCIUM 40 MG PO TABS
40.0000 mg | ORAL_TABLET | Freq: Every day | ORAL | Status: DC
  Administered 2016-02-02 – 2016-02-04 (×3): 40 mg via ORAL
  Filled 2016-02-02 (×3): qty 1

## 2016-02-02 MED ORDER — AMLODIPINE BESYLATE 5 MG PO TABS
10.0000 mg | ORAL_TABLET | Freq: Every day | ORAL | Status: DC
  Administered 2016-02-03 – 2016-02-05 (×3): 10 mg via ORAL
  Filled 2016-02-02 (×4): qty 2

## 2016-02-02 MED ORDER — CEPHALEXIN 250 MG PO CAPS
500.0000 mg | ORAL_CAPSULE | Freq: Four times a day (QID) | ORAL | Status: DC
  Administered 2016-02-02 – 2016-02-05 (×11): 500 mg via ORAL
  Filled 2016-02-02 (×11): qty 2

## 2016-02-02 MED ORDER — ENSURE ENLIVE PO LIQD
237.0000 mL | Freq: Two times a day (BID) | ORAL | Status: DC
  Administered 2016-02-03 – 2016-02-05 (×3): 237 mL via ORAL
  Filled 2016-02-02 (×4): qty 237

## 2016-02-02 MED ORDER — POLYETHYLENE GLYCOL 3350 17 G PO PACK
17.0000 g | PACK | Freq: Every day | ORAL | Status: DC
  Filled 2016-02-02: qty 1

## 2016-02-02 MED ORDER — ASPIRIN EC 325 MG PO TBEC
325.0000 mg | DELAYED_RELEASE_TABLET | Freq: Every day | ORAL | Status: DC
  Administered 2016-02-03 – 2016-02-05 (×3): 325 mg via ORAL
  Filled 2016-02-02 (×3): qty 1

## 2016-02-02 MED ORDER — SENNA 8.6 MG PO TABS
2.0000 | ORAL_TABLET | Freq: Every day | ORAL | Status: DC
  Administered 2016-02-03: 17.2 mg via ORAL
  Filled 2016-02-02 (×4): qty 2

## 2016-02-02 NOTE — Progress Notes (Signed)
Patient currently has no insurance, with Medicaid pending. Patient's DSS Medicaid Case Worker is Sena Slate [phone]). CSW to staff with CSW Director re: disposition options.      Emiliano Dyer, LCSW [facility] ED/18M Clinical Social Worker [phone]

## 2016-02-02 NOTE — ED Provider Notes (Signed)
CSN: [id number]     Arrival date & time 02/02/16  1025 History   First MD Initiated Contact with Patient 02/02/16 1025     No chief complaint on file.  Chief complaint weakness  (Consider location/radiation/quality/duration/timing/severity/associated sxs/prior Treatment) HPI Patient complains of generalized weakness for several months. He has had increased difficulty walking today due to weakness and due to bilateral foot pain, also for the past several months. He is presently hungry. No treatment prior to coming here. Other associated symptoms include constipation. His last bowel movement was 3 days ago. He states "I go for 2 weeks without a bowel movement. Patient seen in the emergency department 01/23/2016, diagnosed with urinary tract infection, prescribed Keflex. Denies fever, denies nausea or vomiting. Past Medical History  Diagnosis Date  . Diabetes mellitus without complication ([place])   . Hypertension   . Renal disorder     pt stated that masses were found on his kidneys  . Renal insufficiency    Past Surgical History  Procedure Laterality Date  . Knee surgery      bilateral   Family History  Problem Relation Age of Onset  . Diabetes Mellitus II Mother   . Hypertension Mother   . Diabetes Mellitus II Sister   . Hypertension Sister    Social History  Substance Use Topics  . Smoking status: Current Every Day Smoker -- 0.25 packs/day for 20 years    Types: Cigarettes  . Smokeless tobacco: Never Used  . Alcohol Use: No    homeless Review of Systems  Constitutional: Positive for fatigue.       Insomnia for many months  Musculoskeletal: Positive for gait problem.       Walks with walker. Bilateral foot pain  Allergic/Immunologic: Positive for immunocompromised state.       Diabetic  Neurological: Positive for weakness.       Chronic right arm and right leg weakness from prior stroke. Also with generalized weakness reports "I feel dehydrated  All other systems reviewed  and are negative.     Allergies  Clonidine derivatives and Lisinopril  Home Medications   Prior to Admission medications   Medication Sig Start Date End Date Taking? Authorizing Provider  amLODipine (NORVASC) 10 MG tablet Take 10 mg by mouth daily. 12/03/15   Historical Provider, MD  aspirin EC 325 MG EC tablet Take 1 tablet (325 mg total) by mouth daily. Patient not taking: Reported on 01/23/2016 10/20/15   Nishant Dhungel, MD  aspirin EC 81 MG tablet Take 81 mg by mouth daily.    Historical Provider, MD  atorvastatin (LIPITOR) 40 MG tablet Take 1 tablet (40 mg total) by mouth daily at 6 PM. 10/20/15   Nishant Dhungel, MD  cephALEXin (KEFLEX) 500 MG capsule Take 1 capsule (500 mg total) by mouth 4 (four) times daily. 01/23/16   Veryl Speak, MD  feeding supplement, ENSURE ENLIVE, (ENSURE ENLIVE) LIQD Take 237 mLs by mouth 2 (two) times daily between meals. Patient not taking: Reported on 01/23/2016 10/20/15   Nishant Dhungel, MD  Lactulose 20 GM/30ML SOLN Take 30 mLs (20 g total) by mouth 2 (two) times daily. Patient not taking: Reported on 01/23/2016 11/25/15   Gerlene Fee, NP  polyethylene glycol (MIRALAX / GLYCOLAX) packet Take 17 g by mouth daily. Patient not taking: Reported on 01/23/2016 10/20/15   Nishant Dhungel, MD  senna (SENOKOT) 8.6 MG tablet Take 2 tablets by mouth at bedtime.    Historical Provider, MD   BP 179/119  mmHg  Pulse 55  Temp(Src) 97.6 F (36.4 C) (Oral)  Resp 11  SpO2 100% Physical Exam  Constitutional: He is oriented to person, place, and time. No distress.  Chronically ill-appearing  HENT:  Head: Normocephalic and atraumatic.  Mucous membranes dry no facial asymmetry  Eyes: Conjunctivae are normal. Pupils are equal, round, and reactive to light.  Neck: Neck supple. No tracheal deviation present. No thyromegaly present.  Cardiovascular: Regular rhythm.   No murmur heard. Bradycardic  Pulmonary/Chest: Effort normal and breath sounds normal.  Abdominal:  Soft. Bowel sounds are normal. He exhibits no distension. There is no tenderness.  Genitourinary: Guaiac negative stool.  Normal tone soft brown stool Hemoccult negative  Musculoskeletal: Normal range of motion. He exhibits no edema or tenderness.  Neurological: He is alert and oriented to person, place, and time. He has normal reflexes. Coordination normal.  DTRs symmetric bilaterally at knee jerk ankle jerk and biceps toes downward going bilaterally motor strength right upper extremity and right lower extremity 4/5, left upper extremity and left lower extremity 5 over 5  Skin: Skin is warm and dry. No rash noted.  Toenails on bilateral feet heapedconsistent with fungal infection  Psychiatric: He has a normal mood and affect.  Nursing note and vitals reviewed.   ED Course  Procedures (including critical care time) Labs Review Labs Reviewed  CBG MONITORING, ED    Imaging Review No results found. I have personally reviewed and evaluated these images and lab results as part of my medical decision-making.   EKG Interpretation   Date/Time:  Tuesday February 02 2016 10:35:45 EDT Ventricular Rate:  51 PR Interval:    QRS Duration: 100 QT Interval:  426 QTC Calculation: 393 R Axis:   33 Text Interpretation:  Second degree AV block, Mobitz II Probable  anteroseptal infarct, old No significant change since last tracing  Confirmed by Winfred Leeds  MD, Eunie Lawn [id number]) on 02/02/2016 11:41:51 AM     Patient currently being treated for urinary tract infection. Urine sent for culture. He started his Keflex 3 days ago. At 2:30 PM patient is alert and ambulates without difficulty with his walker. He ate a full meal in the emergency department. Results for orders placed or performed during the hospital encounter of 02/02/16  Comprehensive metabolic panel  Result Value Ref Range   Sodium 139 135 - 145 mmol/L   Potassium 3.7 3.5 - 5.1 mmol/L   Chloride 105 101 - 111 mmol/L   CO2 28 22 - 32 mmol/L    Glucose, Bld 102 (H) 65 - 99 mg/dL   BUN 14 6 - 20 mg/dL   Creatinine, Ser 1.10 0.61 - 1.24 mg/dL   Calcium 9.0 8.9 - 10.3 mg/dL   Total Protein 6.8 6.5 - 8.1 g/dL   Albumin 3.7 3.5 - 5.0 g/dL   AST 19 15 - 41 U/L   ALT 29 17 - 63 U/L   Alkaline Phosphatase 62 38 - 126 U/L   Total Bilirubin 0.6 0.3 - 1.2 mg/dL   GFR calc non Af Amer >60 >60 mL/min   GFR calc Af Amer >60 >60 mL/min   Anion gap 6 5 - 15  CBC with Differential/Platelet  Result Value Ref Range   WBC 3.1 (L) 4.0 - 10.5 K/uL   RBC 5.37 4.22 - 5.81 MIL/uL   Hemoglobin 16.4 13.0 - 17.0 g/dL   HCT 48.8 39.0 - 52.0 %   MCV 90.9 78.0 - 100.0 fL   MCH 30.5 26.0 - 34.0  pg   MCHC 33.6 30.0 - 36.0 g/dL   RDW 13.0 11.5 - 15.5 %   Platelets 162 150 - 400 K/uL   Neutrophils Relative % 40 %   Neutro Abs 1.2 (L) 1.7 - 7.7 K/uL   Lymphocytes Relative 48 %   Lymphs Abs 1.5 0.7 - 4.0 K/uL   Monocytes Relative 10 %   Monocytes Absolute 0.3 0.1 - 1.0 K/uL   Eosinophils Relative 2 %   Eosinophils Absolute 0.1 0.0 - 0.7 K/uL   Basophils Relative 0 %   Basophils Absolute 0.0 0.0 - 0.1 K/uL  Urinalysis, Routine w reflex microscopic (not at [facility])  Result Value Ref Range   Color, Urine YELLOW YELLOW   APPearance CLOUDY (A) CLEAR   Specific Gravity, Urine 1.022 1.005 - 1.030   pH 5.5 5.0 - 8.0   Glucose, UA 250 (A) NEGATIVE mg/dL   Hgb urine dipstick NEGATIVE NEGATIVE   Bilirubin Urine NEGATIVE NEGATIVE   Ketones, ur NEGATIVE NEGATIVE mg/dL   Protein, ur NEGATIVE NEGATIVE mg/dL   Nitrite POSITIVE (A) NEGATIVE   Leukocytes, UA SMALL (A) NEGATIVE  Urine microscopic-add on  Result Value Ref Range   Squamous Epithelial / LPF 0-5 (A) NONE SEEN   WBC, UA 6-30 0 - 5 WBC/hpf   RBC / HPF NONE SEEN 0 - 5 RBC/hpf   Bacteria, UA MANY (A) NONE SEEN  CBG monitoring, ED  Result Value Ref Range   Glucose-Capillary 95 65 - 99 mg/dL  POC occult blood, ED Provider will collect  Result Value Ref Range   Fecal Occult Bld NEGATIVE NEGATIVE    Ct Renal Stone Study  01/23/2016  CLINICAL DATA:  Hematuria, dysuria, urinary frequency, and low back pain for 1 day. History of renal disorder and monitoring masses on the kidneys. EXAM: CT ABDOMEN AND PELVIS WITHOUT CONTRAST TECHNIQUE: Multidetector CT imaging of the abdomen and pelvis was performed following the standard protocol without IV contrast. COMPARISON:  10/02/2015 FINDINGS: Dependent changes in the lung bases. Multiple lesions demonstrated in both kidneys likely representing cysts. No significant change since prior study. No hydronephrosis or hydroureter. No renal, ureteral, or bladder stones identified. Bladder wall is not thickened. Low-attenuation lesion in the left lobe of the liver corresponding to hemangioma noted on prior contrast-enhanced study. Unenhanced appearance of the gallbladder, pancreas, spleen, inferior vena cava, and retroperitoneal lymph nodes is unremarkable. Bilateral adrenal gland nodules without change in size since prior study. Hounsfield unit measurements are consistent with benign adenomas. Calcification and ectasia of the abdominal aorta with maximal diameter of about 3 cm. No change since prior study. Stomach, small bowel, and colon are not abnormally distended. Can't exclude wall thickening in the upper stomach but this may just be due to under distention of that segment. Consider gastritis. Stool-filled colon. No free air or free fluid in the abdomen. Pelvis: Prostate gland is not enlarged. Bladder wall is not thickened. No free or loculated pelvic fluid collections. No pelvic mass or lymphadenopathy. Appendix is normal. No destructive bone lesions. IMPRESSION: No renal or ureteral stone or obstruction. No acute process demonstrated in the abdomen or pelvis. Again demonstrated are benign-appearing intrarenal and renal lesions, hepatic hemangioma, and 3 cm diameter abdominal aortic aneurysm. No change since prior study. Electronically Signed   By: Lucienne Capers M.D.    On: 01/23/2016 02:06    MDM  Patient's social situation is poor and that he is homeless. I consulted social work who is attempting to find place in assisted  living. He'll stay in the emergency department while social worker finds placement. Diagnosis #1 generalized weakness. #2 mild dehydration #3 urinary tract infection #4 elevated blood pressure Final diagnoses:  None        Orlie Dakin, MD 02/02/16 1456

## 2016-02-02 NOTE — Clinical Social Work Note (Signed)
Clinical Social Work Assessment  Patient Details  Name: Javier Rivera MRN: [id number] Date of Birth: [birth year]  Date of referral:  02/02/16               Reason for consult:  Facility Placement, Housing Concerns/Homelessness, Discharge Planning                Permission sought to share information with:  Chartered certified accountant granted to share information::  No  Name::        Agency::     Relationship::     Contact Information:     Housing/Transportation Living arrangements for the past 2 months:  Homeless Source of Information:  Patient Patient Interpreter Needed:  None Criminal Activity/Legal Involvement Pertinent to Current Situation/Hospitalization:  No - Comment as needed Significant Relationships:  Siblings Lives with:  Self Do you feel safe going back to the place where you live?  Yes Need for family participation in patient care:  No (Coment)  Care giving concerns:  Patient is currently homeless and requiring higher level of care. Patient does not have a skillable need for SNF placement.    Social Worker assessment / plan:  Patient is a 55 YO homeless African American male who presents to the Emergency Department due to generalized weakness. Patient currently has no insurance but per note from financial counseling on today 02/02/16, Patient has a Medicaid Application pending. Patient had a Medicaid hearing on 01/04/16 for a Medicaid application entered on 0000000. Per Patient's Medicaid case manager, Sena Slate, it appears that we are still waiting on the results of the hearing. CSW received approval from [place] Director to use a Letter of Guarantee for ALF placement. CSW engaged with Patient at Patient's bedside. CSW introduced self, role of CSW, and ALF referral process. CSW explained Letter of Guarantee process to Patient as he does not have insurance. Patient apprehensive but agreeable to ALF placement. CSW to continue facilitating ALF  placement.   Employment status:  Unemployed Forensic scientist:  Self Pay (Medicaid Pending) PT Recommendations:  Not assessed at this time Information / Referral to community resources:   (Assisted Living Facilities)  Patient/Family's Response to care:  Patient apprehensive and flat however, appreciative of CSW's assistance and interventions.   Patient/Family's Understanding of and Emotional Response to Diagnosis, Current Treatment, and Prognosis:  Patient verbalized understanding of diagnosis, treatment, and prognosis. Patient reports an understanding of recommendation for assisted living facility placement.   Emotional Assessment Appearance:  Appears stated age Attitude/Demeanor/Rapport:  Apprehensive, Guarded Affect (typically observed):  Apprehensive, Flat, Withdrawn Orientation:  Oriented to Self, Oriented to Place, Oriented to  Time, Oriented to Situation Alcohol / Substance use:  Not Applicable Psych involvement (Current and /or in the community):  No (Comment)  Discharge Needs  Concerns to be addressed:  Homelessness, Financial / Insurance Concerns, Discharge Planning Concerns Readmission within the last 30 days:  No Current discharge risk:  Homeless Barriers to Discharge:  Homeless with medical needs, Inadequate or no insurance   Judeth Horn, LCSW 02/02/2016, 2:27 PM

## 2016-02-02 NOTE — ED Notes (Signed)
Per EMS - pt was sleeping on community bench and [facility] was called. Upon arrival, pt c/o generalized weakness and pt wanted [facility] to call ambulance. Hx right-sided weakness from prev stroke. Stroke screen - right arm weaker than left, bilateral lower extremity weakness. No arm drift noted.

## 2016-02-02 NOTE — NC FL2 (Signed)
[place] LEVEL OF CARE SCREENING TOOL     IDENTIFICATION  Patient Name: Javier Rivera Birthdate: [birth year] Sex: male Admission Date (Current Location): 02/02/2016  [facility] and [place] Number:  [place] and Address:  [facility], [place] [address] [place], [place] [address]      Provider Number: [id number]  Attending Physician Name and Address:  Orlie Dakin, MD  Relative Name and Phone Number:  Wilford Grist (Sister) [phone]    Current Level of Care: Hospital Recommended Level of Care: [place] Prior Approval Number:    Date Approved/Denied:   PASRR Number: [id number]  Discharge Plan: Other (Comment) ([place] Placement)    Current Diagnoses: Patient Active Problem List   Diagnosis Date Noted  . Slow transit constipation 11/25/2015  . Right sided weakness 10/17/2015  . DM type 2, goal A1c below 7 10/17/2015  . Uncontrolled hypertension 10/17/2015  . CVA (cerebral infarction) 10/17/2015  . Sensory disturbance 04/25/2015  . Ascending aorta dilatation (HCC)   . Protein-calorie malnutrition, severe ([place]) 04/24/2015  . Heart block AV second degree 04/23/2015  . Dizziness 04/23/2015  . Diabetes mellitus type 2, controlled ([place]) 04/23/2015  . Essential hypertension 04/23/2015  . Tobacco abuse 04/23/2015  . AV block, 2nd degree   . SEBORRHEIC DERMATITIS 05/07/2010  . RECTAL BLEEDING 08/14/2009  . DIABETES MELLITUS, TYPE II, UNCONTROLLED 06/11/2009  . Hyperlipidemia 06/11/2009  . Cocaine abuse 06/11/2009  . ESSENTIAL HYPERTENSION 06/11/2009  . DIASTOLIC DYSFUNCTION [id number]  . RENAL INSUFFICIENCY 06/11/2009    Orientation RESPIRATION BLADDER Height & Weight     Self, Time, Place, Situation  Normal Continent Weight:   Height:     BEHAVIORAL SYMPTOMS/MOOD NEUROLOGICAL BOWEL NUTRITION STATUS   (NONE)  (NONE) Continent Diet (HEART HEALTHY/CARB MODIFIED)   AMBULATORY STATUS COMMUNICATION OF NEEDS Skin   Limited Assist Verbally Normal                       Personal Care Assistance Level of Assistance  Bathing, Feeding, Dressing Bathing Assistance: Limited assistance Feeding assistance: Independent Dressing Assistance: Limited assistance     Functional Limitations Info  Sight, Hearing, Speech Sight Info: Adequate Hearing Info: Adequate Speech Info: Adequate    SPECIAL CARE FACTORS FREQUENCY  PT (By licensed PT), OT (By licensed OT)     PT Frequency: 5X/ WEEK              Contractures Contractures Info: Not present    Additional Factors Info  Code Status, Allergies Code Status Info: FULL Allergies Info: CLONIDINE DERIVATIVES; LISINOPRIL           Current Medications (02/02/2016):  This is the current hospital active medication list No current facility-administered medications for this encounter.   Current Outpatient Prescriptions  Medication Sig Dispense Refill  . amLODipine (NORVASC) 10 MG tablet Take 10 mg by mouth daily.  0  . aspirin EC 81 MG tablet Take 81 mg by mouth daily.    Marland Kitchen atorvastatin (LIPITOR) 40 MG tablet Take 1 tablet (40 mg total) by mouth daily at 6 PM. 30 tablet 0  . cephALEXin (KEFLEX) 500 MG capsule Take 1 capsule (500 mg total) by mouth 4 (four) times daily. 28 capsule 0  . aspirin EC 325 MG EC tablet Take 1 tablet (325 mg total) by mouth daily. (Patient not taking: Reported on 01/23/2016) 30 tablet 0  . feeding supplement, ENSURE ENLIVE, (ENSURE ENLIVE) LIQD Take 237 mLs by  mouth 2 (two) times daily between meals. (Patient not taking: Reported on 01/23/2016) 237 mL 12  . Lactulose 20 GM/30ML SOLN Take 30 mLs (20 g total) by mouth 2 (two) times daily. (Patient not taking: Reported on 01/23/2016) 120 mL prn  . polyethylene glycol (MIRALAX / GLYCOLAX) packet Take 17 g by mouth daily. (Patient not taking: Reported on 01/23/2016) 14 each 0  . senna (SENOKOT) 8.6 MG tablet Take 2 tablets by mouth at  bedtime.       Discharge Medications: Please see discharge summary for a list of discharge medications.  Relevant Imaging Results:  Relevant Lab Results:   Additional Information SSN [id number]  Judeth Horn, LCSW

## 2016-02-03 ENCOUNTER — Encounter (HOSPITAL_COMMUNITY): Payer: Self-pay | Admitting: Emergency Medicine

## 2016-02-03 LAB — CBG MONITORING, ED: GLUCOSE-CAPILLARY: 114 mg/dL — AB (ref 65–99)

## 2016-02-03 NOTE — ED Notes (Signed)
A regular diet ordered for patient.

## 2016-02-03 NOTE — Progress Notes (Signed)
CSW has faxed Patient's records to [facility] and [facility]. [facility] to review Patient's referral and give CSW a return phone call. CSW continues to actively pursue ALF placement.          Emiliano Dyer, LCSW [facility] ED/74M Clinical Social Worker [phone]

## 2016-02-03 NOTE — Progress Notes (Signed)
CSW contacted [place] [phone] several times and was not able to reach [facility] Civil engineer, contracting), was also unable to leave a message because the mailbox was full. CSW also contacted [facility] [phone] several times throughout the day and did not receive an answer. Was unable to leave a message as their was no voicemail option. Will continue to try to contact facilities tomorrow for placement.  Georga Kaufmann, LCSWA

## 2016-02-03 NOTE — Progress Notes (Addendum)
CSW attempted to contact [facility] [phone]), Director of [place]. [facility] did not answer and the mailbox was full. CSW will continue to try to contact facility throughout the day for placement.    Georga Kaufmann, LCSWA

## 2016-02-03 NOTE — ED Notes (Signed)
Ordered Breakfast Tray  

## 2016-02-04 LAB — URINE CULTURE: SPECIAL REQUESTS: NORMAL

## 2016-02-04 NOTE — ED Notes (Signed)
Social Work at bedside discussing plan of care options

## 2016-02-04 NOTE — ED Provider Notes (Addendum)
Pt awaiting SW disposition. He has UTI.  RN alerted me when she noted that the HR of the patient was consistently dropping into the 40s and sometimes 30s. The rhythm strip shows 2nd degree type 2. ST elevation in lead V.  Repeat EKG ordered. Likely will need Cards consult.  Pt denies chest pain, dib, near fainting or dizziness. He is not the best of historian.   Varney Biles, MD 02/04/16 1819  11:49 PM I spoke with the Cardiologist. Upon closely reviewing the EKG with Dr. Rosanna Randy, we noticed PR prolongation, and so patient is 2nd degree mobitz 1 block. PT is asymptomatic, so there is no further intervention or evaluation needed, even for HR drops in the 30s or 40s since they are transient. Pt's meds were reviewed, and no changes are needed.  Varney Biles, MD 02/04/16 2350

## 2016-02-04 NOTE — ED Notes (Signed)
This RN f/u with nutrition re: pts tray not being delivered, pts breakfast tray re ordered

## 2016-02-04 NOTE — ED Notes (Signed)
Dinner order called in at this time.

## 2016-02-05 NOTE — ED Notes (Signed)
Patient resting denies needing anything at this time.

## 2016-02-05 NOTE — ED Provider Notes (Signed)
3:36 PM  Medically clear at this time. ambulatroy in the ER. Vitals normal. Dc with outpatient resouces.   Jola Schmidt, MD 02/05/16 1537

## 2016-02-05 NOTE — ED Notes (Signed)
Pt ambulated to bathroom 

## 2016-02-05 NOTE — ED Notes (Signed)
Patient able to ambulate independently with walker.  Pt given additional resource guides for shelters and housing facilities.

## 2016-02-05 NOTE — Progress Notes (Signed)
CSW has consulted with CSW Surveyor, quantity who reports that [facility] is currently full but will continue to review for placement once bed becomes available. CSW Surveyor, quantity waiting on return phone call from [facility] regarding possible admission. CSW will continue to follow.          Emiliano Dyer, LCSW [facility] ED/50M Clinical Social Worker [phone]

## 2016-02-05 NOTE — Progress Notes (Signed)
CSW received T/C from [place]. Per AD, [place] expected 3 discharges on today, however, the Patients did not discharge. [facility] remains full at this time and does not do weekend admissions. Unfortunately, Patient does not have any further ALF options at this time. CSW will discuss with Patient re: discharge plan.          Emiliano Dyer, LCSW [facility] ED/43M Clinical Social Worker [phone]

## 2016-02-05 NOTE — Discharge Instructions (Signed)
[employer] Social Services The United Ways 211 is a great source of information about community services available.  Access by dialing 2-1-1 from anywhere in [place], or by website -  CustodianSupply.fi.   Other Local Resources (Updated 08/2015)  Social  Nutritional therapist Number and Address  Aging, Disability and [place] on wheels  Community meal sites  Transportation services  Adult day health care  Center for [place] caregiver support services [place], [place]  Guardianship  Hearing loss (assistive technology, interpreters, etc.)  Low-income services (health care, child care, housing, financial and nutrition assistance)  Medicaid  Pregnancy services  Utility assistance  Veterans services [phone] [address], [place] [address]   [place] management  Family education  Information and referral [phone] [address] [place], [place] [address]  [place] Social Services  Aging and [place] services  Deaf-Blind services  Disability services  Guardianship  Hearing loss (assistive technology, interpreters, etc.)  Low-income services (health care, child care, housing, financial and nutrition assistance)  Medicaid  Pregnancy services  Utility assistance  Veterans services [phone] [address] [place], [place] [address]  [place]  Aging and [place] services  Guardianship  Hearing loss (assistive technology, interpreters, etc.)  Low-income services (health care, child care, housing, financial and nutrition  assistance)  [facility]  Pregnancy services  Utility assistance  Veterans services [phone] [place], [place] [address]  [place] services  Guardianship  Hearing loss (assistive technology, interpreters, etc.)  Low-income services (health care, child care, housing, financial and nutrition assistance)  Medicaid  Pregnancy services  Utility assistance  Veterans services [phone] [place], [place] [address]   [place]  Aging and [place]  Hearing loss (assistive technology, interpreters, etc.)  Low-income services (health care, child care, housing, financial and nutrition assistance)  Medicaid  Pregnancy services  Utility assistance  Veterans services [phone] [address] [place], [place] [address]  [place] Division of Social Services  Aging and [place]  Hearing loss (assistive technology, interpreters, etc.)  Low-income services (health care, child care, housing, financial and nutrition assistance)  Medicaid  Pregnancy services  Utility assistance  Veterans services [phone] [address], [place] [address]  Senior Resources of [place]  Serves adults age 41 and over and their families  Caregiver information  Foster grandparents  Special educational needs teacher meals  Refugee programs  Retired Energy manager (RSVP)  [place] [facility])  [place]  [phone]  [address], [place] [address]  [phone]  [place], [place] [address]  [employer]  Help Line  Home Care  Living at [employer] on [place]  Support Groups  Care Partner Workshops  Referrals for chore and homemaker services [phone]  [address] [place], [place] [address]  The [place] assistance  Medication assistance  Rental assistance  Food pantry  Medication assistance  Housing assistance  Emergency food distribution  Utility assistance  [facility] [facility] [place] [address] [place], [place] [phone] and Thursdays from [place] - 12 noon by appointment only) [address] [place], [place] [address]  [phone] [address] [place], [place] [address]    Community Resource Guide Shelters The United Ways 211 is a great source of information about community services available.  Access by dialing 2-1-1 from anywhere in [place], or by website -  CustodianSupply.fi.   Other Local Resources (Updated 08/2015)  [place]   Phone Number and Address  [place] for homeless and needy men with substance abuse issues [phone] [address] of [place]  Emergency assistance  [employer]  Pantry services [phone] [place], [place]  Domestic violence shelter for women and their children [place], [place]  Domestic violence shelter for women and their children [place], [place] [facility])     [facility] coordinates access to most shelters in [place] in person Monday - Friday, 10 am - 4 pm.    After hours/ weekends, contact individual shelters directly [phone] [address], [place]  [facility] [place]  Emergency financial assistance  Permanent supportive housing [phone] [address] [place], [place]   [employer]   Crisis assistance  Medication  Housing  Food  Utility assistance [phone] [place], [place]    Crisis assistance  Medication  Housing  Food  Utility assistance [place], [place], [place]  The [employer] of [place]     Transitional housing  Case Psychiatric nurse assistance [phone] [address] [place], [place], [employer] for adult men and women [phone] [address], [place] for those Facing Homelessness    [phone]

## 2016-02-05 NOTE — ED Notes (Signed)
Pt ambulated to bathroom.  States he's always constipated but does not want ANYTHING for his constipation b/c nothing works and everything hurts his stomach.

## 2016-02-05 NOTE — Progress Notes (Signed)
Late Entry  CSW engaged with Patient at Patient's bedside to provide update of plan of care. CSW explained that the only facilities that will take a letter of guarantee for [place] placement are [facility] in [place] and [facility] in [place]. At the present time, [facility] and [facility] are reviewing Patient's chart. CSW has been in contact with CSW Surveyor, quantity is also working with above listed facilities to try to secure placement. Patient reports an understanding that the facilities may not have a bed at this time and that they are our only options. CSW agreed to provide Patient with updates as they become available. CSW continues to work on obtaining placement for this patient.          Emiliano Dyer, LCSW [facility] ED/63M Clinical Social Worker [phone]

## 2016-02-05 NOTE — ED Notes (Addendum)
Pt dressing independently, has been check on by RN twice now, pt dressing very slowly.  RN offered to help, pt rejecting RN help

## 2016-02-08 ENCOUNTER — Emergency Department (HOSPITAL_COMMUNITY): Payer: MEDICAID

## 2016-02-08 ENCOUNTER — Emergency Department (HOSPITAL_COMMUNITY)
Admission: EM | Admit: 2016-02-08 | Discharge: 2016-02-08 | Disposition: A | Discharge status: Home / Self Care | Payer: MEDICAID | Attending: Emergency Medicine | Admitting: Emergency Medicine

## 2016-02-08 ENCOUNTER — Encounter (HOSPITAL_COMMUNITY): Payer: Self-pay

## 2016-02-08 DIAGNOSIS — E119 Type 2 diabetes mellitus without complications: Secondary | ICD-10-CM | POA: Insufficient documentation

## 2016-02-08 DIAGNOSIS — R9389 Abnormal findings on diagnostic imaging of other specified body structures: Secondary | ICD-10-CM

## 2016-02-08 DIAGNOSIS — Z7982 Long term (current) use of aspirin: Secondary | ICD-10-CM | POA: Insufficient documentation

## 2016-02-08 DIAGNOSIS — I10 Essential (primary) hypertension: Secondary | ICD-10-CM | POA: Insufficient documentation

## 2016-02-08 DIAGNOSIS — R918 Other nonspecific abnormal finding of lung field: Secondary | ICD-10-CM | POA: Insufficient documentation

## 2016-02-08 DIAGNOSIS — Z7984 Long term (current) use of oral hypoglycemic drugs: Secondary | ICD-10-CM | POA: Insufficient documentation

## 2016-02-08 DIAGNOSIS — F17210 Nicotine dependence, cigarettes, uncomplicated: Secondary | ICD-10-CM | POA: Insufficient documentation

## 2016-02-08 DIAGNOSIS — Z8673 Personal history of transient ischemic attack (TIA), and cerebral infarction without residual deficits: Secondary | ICD-10-CM | POA: Insufficient documentation

## 2016-02-08 DIAGNOSIS — Z79899 Other long term (current) drug therapy: Secondary | ICD-10-CM | POA: Insufficient documentation

## 2016-02-08 DIAGNOSIS — R079 Chest pain, unspecified: Secondary | ICD-10-CM | POA: Insufficient documentation

## 2016-02-08 LAB — I-STAT CHEM 8, ED
BUN: 24 mg/dL — ABNORMAL HIGH (ref 6–20)
CALCIUM ION: 1.16 mmol/L (ref 1.13–1.30)
CHLORIDE: 101 mmol/L (ref 101–111)
Creatinine, Ser: 1.2 mg/dL (ref 0.61–1.24)
GLUCOSE: 77 mg/dL (ref 65–99)
HCT: 52 % (ref 39.0–52.0)
HEMOGLOBIN: 17.7 g/dL — AB (ref 13.0–17.0)
Potassium: 4.1 mmol/L (ref 3.5–5.1)
Sodium: 140 mmol/L (ref 135–145)
TCO2: 26 mmol/L (ref 0–100)

## 2016-02-08 LAB — CBG MONITORING, ED: Glucose-Capillary: 76 mg/dL (ref 65–99)

## 2016-02-08 LAB — I-STAT TROPONIN, ED: TROPONIN I, POC: 0 ng/mL (ref 0.00–0.08)

## 2016-02-08 NOTE — ED Provider Notes (Signed)
CSN: [id number]     Arrival date & time 02/08/16  [id number] History   First MD Initiated Contact with Patient 02/08/16 [phone]     Chief Complaint  Patient presents with  . Chest Pain     (Consider location/radiation/quality/duration/timing/severity/associated sxs/prior Treatment) HPI   Javier Rivera is a 55 y.o. male who is homeless, he walked here this morning to be evaluated for chest pain, which started last night. He is vague, and unable to specify any other particulars about the chest pain. He is homeless, and did not eat this morning. He reports that he is taking his medications. He denies fever, chills, cough, nausea or vomiting. There are no other known modifying factors.   Past Medical History  Diagnosis Date  . Diabetes mellitus without complication ([place])   . Hypertension   . Renal disorder     pt stated that masses were found on his kidneys  . Renal insufficiency   . Stroke [facility])    Past Surgical History  Procedure Laterality Date  . Knee surgery      bilateral   Family History  Problem Relation Age of Onset  . Diabetes Mellitus II Mother   . Hypertension Mother   . Diabetes Mellitus II Sister   . Hypertension Sister    Social History  Substance Use Topics  . Smoking status: Current Every Day Smoker -- 0.25 packs/day for 20 years    Types: Cigarettes  . Smokeless tobacco: Never Used  . Alcohol Use: No    Review of Systems  All other systems reviewed and are negative.     Allergies  Clonidine derivatives and Lisinopril  Home Medications   Prior to Admission medications   Medication Sig Start Date End Date Taking? Authorizing Provider  amLODipine (NORVASC) 10 MG tablet Take 10 mg by mouth daily. 12/03/15   Historical Provider, MD  aspirin EC 325 MG EC tablet Take 1 tablet (325 mg total) by mouth daily. Patient not taking: Reported on 01/23/2016 10/20/15   Nishant Dhungel, MD  aspirin EC 81 MG tablet Take 81 mg by mouth daily.    Historical Provider, MD   atorvastatin (LIPITOR) 40 MG tablet Take 1 tablet (40 mg total) by mouth daily at 6 PM. 10/20/15   Nishant Dhungel, MD  cephALEXin (KEFLEX) 500 MG capsule Take 1 capsule (500 mg total) by mouth 4 (four) times daily. 01/23/16   Veryl Speak, MD  feeding supplement, ENSURE ENLIVE, (ENSURE ENLIVE) LIQD Take 237 mLs by mouth 2 (two) times daily between meals. Patient not taking: Reported on 01/23/2016 10/20/15   Nishant Dhungel, MD  glipiZIDE (GLUCOTROL) 10 MG tablet Take 10 mg by mouth daily before breakfast.    Historical Provider, MD  Lactulose 20 GM/30ML SOLN Take 30 mLs (20 g total) by mouth 2 (two) times daily. Patient not taking: Reported on 01/23/2016 11/25/15   Gerlene Fee, NP  polyethylene glycol (MIRALAX / GLYCOLAX) packet Take 17 g by mouth daily. 10/20/15   Nishant Dhungel, MD  senna (SENOKOT) 8.6 MG tablet Take 2 tablets by mouth at bedtime.    Historical Provider, MD   BP 144/100 mmHg  Pulse 68  Resp 19  SpO2 100% Physical Exam  Constitutional: He is oriented to person, place, and time. He appears well-developed and well-nourished. No distress.  HENT:  Head: Normocephalic and atraumatic.  Right Ear: External ear normal.  Left Ear: External ear normal.  Eyes: Conjunctivae and EOM are normal. Pupils are equal, round, and reactive to  light.  Neck: Normal range of motion and phonation normal. Neck supple.  Cardiovascular: Normal rate, regular rhythm and normal heart sounds.   Pulmonary/Chest: Effort normal and breath sounds normal. No respiratory distress. He exhibits no bony tenderness.  Abdominal: Soft. There is no tenderness.  Musculoskeletal: Normal range of motion.  Neurological: He is alert and oriented to person, place, and time. No cranial nerve deficit or sensory deficit. He exhibits normal muscle tone. Coordination normal.  No dysarthria or aphasia.  Skin: Skin is warm, dry and intact.  Psychiatric: He has a normal mood and affect. His behavior is normal. Judgment and  thought content normal.  Nursing note and vitals reviewed.   ED Course  Procedures (including critical care time)  Recent notes in Epic reviewed. Appears patient has been trying to get into an assisted living facility, but been unable to find a bed. Will reconsult social work, for assistance with homelessness issues.  Medications - No data to display  Patient Vitals for the past 24 hrs:  BP Pulse Resp SpO2  02/08/16 1000 144/100 mmHg 68 19 100 %  02/08/16 0945 (!) 142/106 mmHg 60 11 95 %    I discussed the abnormal chest x-ray results with the radiologist. Dr. Register feels that the patient can be discharged to follow-up with a primary care doctor "down the road" to get a repeat chest x-ray. He stated to me, that this could be done in a week or 2.   Social work consultation- they'll evaluate him again today, they know him well. There were no additional available resources. They reinforced the prior recommendations for follow-up, which he has not taken advantage of yet.  10:59 AM Reevaluation with update and discussion. After initial assessment and treatment, an updated evaluation reveals he had been offered breakfast, and apparently ate some. There are no additional complaints. Findings discussed with patient and all questions answered. Quinn Review Labs Reviewed  I-STAT CHEM 8, ED - Abnormal; Notable for the following:    BUN 24 (*)    Hemoglobin 17.7 (*)    All other components within normal limits  I-STAT TROPOININ, ED  CBG MONITORING, ED    Imaging Review Dg Chest 2 View  02/08/2016  CLINICAL DATA:  Shortness of breath . EXAM: CHEST  2 VIEW COMPARISON:  09/16/2015, 04/23/2015, 01/01/2015, 09/05/2014, 05/31/2009. CT chest 04/24/2015. FINDINGS: Mediastinum and hilar structures normal. Questionable density noted in the lung bases, most likely left lower lobe. Best seen on lateral view. Questionable nodular opacity right upper lobe. Follow-up PA lateral chest  x-ray suggested. If these densities persists nonenhanced chest CT suggest for further evaluation . No pleural effusion or pneumothorax. Heart size normal. IMPRESSION: 1. Questionable density, most likely left lower lobe, best seen on lateral view. Questionable nodule right upper lobe. A repeat PA and lateral chest x-ray is suggested, if these densities persist nonenhanced chest CT is suggested. 2.  Bilateral nipple shadows again noted. Electronically Signed   By: Marcello Moores  Register   On: 02/08/2016 09:15   I have personally reviewed and evaluated these images and lab results as part of my medical decision-making.   EKG Interpretation   Date/Time:  Monday February 08 2016 09:45:01 EDT Ventricular Rate:  62 PR Interval:    QRS Duration: 96 QT Interval:  413 QTC Calculation: 420 R Axis:   48 Text Interpretation:  Sinus rhythm Prolonged PR interval Probable  anteroseptal infarct, old since last tracing no significant change  Confirmed  by Eulis Foster  MD, [place] [phone]) on 02/08/2016 9:48:55 AM      MDM   Final diagnoses:  Nonspecific chest pain  Abnormal chest x-ray    Nonspecific chest pain, complicated by a homelessness. He apparently does not follow-up with a primary care doctor, regularly.  Nursing Notes Reviewed/ Care Coordinated Applicable Imaging Reviewed Interpretation of Laboratory Data incorporated into ED treatment  The patient appears reasonably screened and/or stabilized for discharge and I doubt any other medical condition or other [facility] requiring further screening, evaluation, or treatment in the ED at this time prior to discharge.  Plan: Home Medications- usual; Home Treatments- resdt; return here if the recommended treatment, does not improve the symptoms; Recommended follow up- PCP asap. Social Services as recommended.  Daleen Bo, MD 02/08/16 1109

## 2016-02-08 NOTE — ED Notes (Signed)
Pt. Reports having pain all over his body.  This is not new pain he stated, "I always have pain."  Skin is warm and dry. Alert and oriented X4  Pt. Has requested a tray of food.  Den ies any sob, n/v/d

## 2016-02-08 NOTE — Progress Notes (Signed)
CSW engaged with Patient at his bedside. CSW familiar with Patient from previous ED visits. Patient is chronically homeless. Patient informed CSW that he has not attempted to utilize any of the homelessness resources provided to him on last Friday at discharge. CSW provided Patient with the resources again and encouraged him to utilize self-determination in finding temporary housing. Resources included [employer], [employer], [employer], and [employer]. Patient reports that he has income "sometimes" and may be able to get a boarding room or extended stay hotel room. CSW inquired about any family that Patient could stay with until he receives his Medicaid. Patient denies having any family that CSW could contact. CSW emphasized to Patient that he utilize the resources provided to him as part of self-determination. Patient agreeable to plan. CSW signing off. Please contact if new need(s) arise.     Emiliano Dyer, LCSW [facility] ED/64M Clinical Social Worker [phone]

## 2016-02-08 NOTE — ED Notes (Signed)
Pt. Given a bus pass

## 2016-02-08 NOTE — ED Notes (Signed)
CBG 76 

## 2016-02-08 NOTE — ED Notes (Signed)
Gave Pt a [place] sandwich and 2 orange juices per [employer]

## 2016-02-08 NOTE — ED Notes (Signed)
Ordered diet tray 

## 2016-02-08 NOTE — Discharge Instructions (Signed)
Follow-up with resources, which you were given, by the social worker, to help you. You will need to find a primary care doctor as soon as possible. We are recommending that you get a repeat chest x-ray done in 1 or 2 weeks. Return here, if needed, for problems.  Nonspecific Chest Pain  Chest pain can be caused by many different conditions. There is always a chance that your pain could be related to something serious, such as a heart attack or a blood clot in your lungs. Chest pain can also be caused by conditions that are not life-threatening. If you have chest pain, it is very important to follow up with your health care provider. CAUSES  Chest pain can be caused by:  Heartburn.  Pneumonia or bronchitis.  Anxiety or stress.  Inflammation around your heart (pericarditis) or lung (pleuritis or pleurisy).  A blood clot in your lung.  A collapsed lung (pneumothorax). It can develop suddenly on its own (spontaneous pneumothorax) or from trauma to the chest.  Shingles infection (varicella-zoster virus).  Heart attack.  Damage to the bones, muscles, and cartilage that make up your chest wall. This can include:  Bruised bones due to injury.  Strained muscles or cartilage due to frequent or repeated coughing or overwork.  Fracture to one or more ribs.  Sore cartilage due to inflammation (costochondritis). RISK FACTORS  Risk factors for chest pain may include:  Activities that increase your risk for trauma or injury to your chest.  Respiratory infections or conditions that cause frequent coughing.  Medical conditions or overeating that can cause heartburn.  Heart disease or family history of heart disease.  Conditions or health behaviors that increase your risk of developing a blood clot.  Having had chicken pox (varicella zoster). SIGNS AND SYMPTOMS Chest pain can feel like:  Burning or tingling on the surface of your chest or deep in your chest.  Crushing, pressure,  aching, or squeezing pain.  Dull or sharp pain that is worse when you move, cough, or take a deep breath.  Pain that is also felt in your back, neck, shoulder, or arm, or pain that spreads to any of these areas. Your chest pain may come and go, or it may stay constant. DIAGNOSIS Lab tests or other studies may be needed to find the cause of your pain. Your health care provider may have you take a test called an ambulatory ECG (electrocardiogram). An ECG records your heartbeat patterns at the time the test is performed. You may also have other tests, such as:  Transthoracic echocardiogram (TTE). During echocardiography, sound waves are used to create a picture of all of the heart structures and to look at how blood flows through your heart.  Transesophageal echocardiogram (TEE).This is a more advanced imaging test that obtains images from inside your body. It allows your health care provider to see your heart in finer detail.  Cardiac monitoring. This allows your health care provider to monitor your heart rate and rhythm in real time.  Holter monitor. This is a portable device that records your heartbeat and can help to diagnose abnormal heartbeats. It allows your health care provider to track your heart activity for several days, if needed.  Stress tests. These can be done through exercise or by taking medicine that makes your heart beat more quickly.  Blood tests.  Imaging tests. TREATMENT  Your treatment depends on what is causing your chest pain. Treatment may include:  Medicines. These may include:  Acid blockers  for heartburn.  Anti-inflammatory medicine.  Pain medicine for inflammatory conditions.  Antibiotic medicine, if an infection is present.  Medicines to dissolve blood clots.  Medicines to treat coronary artery disease.  Supportive care for conditions that do not require medicines. This may include:  Resting.  Applying heat or cold packs to injured  areas.  Limiting activities until pain decreases. HOME CARE INSTRUCTIONS  If you were prescribed an antibiotic medicine, finish it all even if you start to feel better.  Avoid any activities that bring on chest pain.  Do not use any tobacco products, including cigarettes, chewing tobacco, or electronic cigarettes. If you need help quitting, ask your health care provider.  Do not drink alcohol.  Take medicines only as directed by your health care provider.  Keep all follow-up visits as directed by your health care provider. This is important. This includes any further testing if your chest pain does not go away.  If heartburn is the cause for your chest pain, you may be told to keep your head raised (elevated) while sleeping. This reduces the chance that acid will go from your stomach into your esophagus.  Make lifestyle changes as directed by your health care provider. These may include:  Getting regular exercise. Ask your health care provider to suggest some activities that are safe for you.  Eating a heart-healthy diet. A registered dietitian can help you to learn healthy eating options.  Maintaining a healthy weight.  Managing diabetes, if necessary.  Reducing stress. SEEK MEDICAL CARE IF:  Your chest pain does not go away after treatment.  You have a rash with blisters on your chest.  You have a fever. SEEK IMMEDIATE MEDICAL CARE IF:   Your chest pain is worse.  You have an increasing cough, or you cough up blood.  You have severe abdominal pain.  You have severe weakness.  You faint.  You have chills.  You have sudden, unexplained chest discomfort.  You have sudden, unexplained discomfort in your arms, back, neck, or jaw.  You have shortness of breath at any time.  You suddenly start to sweat, or your skin gets clammy.  You feel nauseous or you vomit.  You suddenly feel light-headed or dizzy.  Your heart begins to beat quickly, or it feels like it  is skipping beats. These symptoms may represent a serious problem that is an emergency. Do not wait to see if the symptoms will go away. Get medical help right away. Call your local emergency services (911 in the [place] Do not drive yourself to the hospital.   This information is not intended to replace advice given to you by your health care provider. Make sure you discuss any questions you have with your health care provider.   Document Released: 05/04/2005 Document Revised: 08/15/2014 Document Reviewed: 02/28/2014 Elsevier Interactive Patient Education [employer].

## 2016-02-08 NOTE — ED Notes (Signed)
Pt. Ambulated to the front of the ED  Gait steady.

## 2016-02-16 ENCOUNTER — Emergency Department (HOSPITAL_COMMUNITY): Payer: MEDICAID

## 2016-02-16 ENCOUNTER — Emergency Department (HOSPITAL_COMMUNITY): Payer: Self-pay

## 2016-02-16 ENCOUNTER — Encounter (HOSPITAL_COMMUNITY): Payer: Self-pay | Admitting: Emergency Medicine

## 2016-02-16 ENCOUNTER — Emergency Department (HOSPITAL_COMMUNITY)
Admission: EM | Admit: 2016-02-16 | Discharge: 2016-02-16 | Disposition: A | Discharge status: Home / Self Care | Payer: Self-pay | Attending: Emergency Medicine | Admitting: Emergency Medicine

## 2016-02-16 DIAGNOSIS — F17210 Nicotine dependence, cigarettes, uncomplicated: Secondary | ICD-10-CM | POA: Insufficient documentation

## 2016-02-16 DIAGNOSIS — R0789 Other chest pain: Secondary | ICD-10-CM

## 2016-02-16 DIAGNOSIS — Z7982 Long term (current) use of aspirin: Secondary | ICD-10-CM | POA: Insufficient documentation

## 2016-02-16 DIAGNOSIS — E119 Type 2 diabetes mellitus without complications: Secondary | ICD-10-CM | POA: Insufficient documentation

## 2016-02-16 DIAGNOSIS — I712 Thoracic aortic aneurysm, without rupture, unspecified: Secondary | ICD-10-CM | POA: Insufficient documentation

## 2016-02-16 DIAGNOSIS — Z7984 Long term (current) use of oral hypoglycemic drugs: Secondary | ICD-10-CM | POA: Insufficient documentation

## 2016-02-16 DIAGNOSIS — Z8673 Personal history of transient ischemic attack (TIA), and cerebral infarction without residual deficits: Secondary | ICD-10-CM | POA: Insufficient documentation

## 2016-02-16 DIAGNOSIS — I441 Atrioventricular block, second degree: Secondary | ICD-10-CM | POA: Insufficient documentation

## 2016-02-16 DIAGNOSIS — I10 Essential (primary) hypertension: Secondary | ICD-10-CM | POA: Insufficient documentation

## 2016-02-16 DIAGNOSIS — I160 Hypertensive urgency: Secondary | ICD-10-CM | POA: Insufficient documentation

## 2016-02-16 DIAGNOSIS — R079 Chest pain, unspecified: Secondary | ICD-10-CM | POA: Insufficient documentation

## 2016-02-16 LAB — BASIC METABOLIC PANEL
ANION GAP: 5 (ref 5–15)
BUN: 8 mg/dL (ref 6–20)
CALCIUM: 8.9 mg/dL (ref 8.9–10.3)
CO2: 30 mmol/L (ref 22–32)
CREATININE: 1.15 mg/dL (ref 0.61–1.24)
Chloride: 105 mmol/L (ref 101–111)
Glucose, Bld: 95 mg/dL (ref 65–99)
Potassium: 3.7 mmol/L (ref 3.5–5.1)
SODIUM: 140 mmol/L (ref 135–145)

## 2016-02-16 LAB — CBC WITH DIFFERENTIAL/PLATELET
BASOS ABS: 0 10*3/uL (ref 0.0–0.1)
BASOS PCT: 0 %
EOS ABS: 0.1 10*3/uL (ref 0.0–0.7)
EOS PCT: 3 %
HCT: 46.9 % (ref 39.0–52.0)
Hemoglobin: 15.6 g/dL (ref 13.0–17.0)
Lymphocytes Relative: 45 %
Lymphs Abs: 1.4 10*3/uL (ref 0.7–4.0)
MCH: 30 pg (ref 26.0–34.0)
MCHC: 33.3 g/dL (ref 30.0–36.0)
MCV: 90.2 fL (ref 78.0–100.0)
MONO ABS: 0.2 10*3/uL (ref 0.1–1.0)
MONOS PCT: 7 %
NEUTROS ABS: 1.4 10*3/uL — AB (ref 1.7–7.7)
Neutrophils Relative %: 45 %
PLATELETS: 148 10*3/uL — AB (ref 150–400)
RBC: 5.2 MIL/uL (ref 4.22–5.81)
RDW: 12.7 % (ref 11.5–15.5)
WBC: 3 10*3/uL — ABNORMAL LOW (ref 4.0–10.5)

## 2016-02-16 LAB — MAGNESIUM: MAGNESIUM: 1.8 mg/dL (ref 1.7–2.4)

## 2016-02-16 LAB — CBG MONITORING, ED: GLUCOSE-CAPILLARY: 98 mg/dL (ref 65–99)

## 2016-02-16 LAB — I-STAT TROPONIN, ED
TROPONIN I, POC: 0 ng/mL (ref 0.00–0.08)
Troponin i, poc: 0.01 ng/mL (ref 0.00–0.08)

## 2016-02-16 MED ORDER — AMLODIPINE BESYLATE 10 MG PO TABS: 10.0000 mg | ORAL_TABLET | Freq: Every day | ORAL | Status: DC

## 2016-02-16 MED ORDER — POTASSIUM CHLORIDE CRYS ER 20 MEQ PO TBCR
40.0000 meq | EXTENDED_RELEASE_TABLET | Freq: Once | ORAL | Status: AC
  Administered 2016-02-16: 40 meq via ORAL
  Filled 2016-02-16: qty 2

## 2016-02-16 MED ORDER — MAGNESIUM SULFATE IN D5W 1-5 GM/100ML-% IV SOLN
1.0000 g | Freq: Once | INTRAVENOUS | Status: AC
  Administered 2016-02-16: 1 g via INTRAVENOUS
  Filled 2016-02-16: qty 100

## 2016-02-16 MED ORDER — AMLODIPINE BESYLATE 5 MG PO TABS
10.0000 mg | ORAL_TABLET | Freq: Every day | ORAL | Status: DC
  Administered 2016-02-16: 10 mg via ORAL
  Filled 2016-02-16: qty 2

## 2016-02-16 MED ORDER — HYDRALAZINE HCL 25 MG PO TABS
25.0000 mg | ORAL_TABLET | Freq: Once | ORAL | Status: AC
  Administered 2016-02-16: 25 mg via ORAL
  Filled 2016-02-16: qty 1

## 2016-02-16 MED ORDER — IOPAMIDOL (ISOVUE-370) INJECTION 76%
INTRAVENOUS | Status: AC
  Administered 2016-02-16: 100 mL
  Filled 2016-02-16: qty 100

## 2016-02-16 NOTE — ED Notes (Signed)
Patient to ED from home, poor historian - states he had a stroke 2 months ago and was seen in the ED 7/3 for "the same thing... Everything hurts..." Patient also c/o SOB for about a week. Patient A&O x 4. Denies CP.

## 2016-02-16 NOTE — Discharge Instructions (Signed)
You were seen and evaluated today for your intermittent shortness of breath and chest pain. The cardiology team would like you to follow up with an outpatient. They have already scheduled you an appointment. Please make this appointment. Please continue to take your blood pressure medications.  Second-Degree Atrioventricular Block Second-degree atrioventricular (AV) block is a type of heart block. About Heart Block Heart block is a problem with the system that controls how often the heart beats (heart rate). If you have heart block, the electrical signals that regulate your heart rate are slowed or interrupted. Normal Heart Action The heart has two upper chambers (atria) and two lower chambers (ventricles). They work together to pump blood to the body. The heartbeat starts in an upper area of the right atrium (sinoatrial node, or SA node). This is the heart's natural pacemaker. The SA node sends electrical signals that pass through another node (atrioventricular node, or AV node), which is located between the atria and ventricles. Next, the signals travel through the ventricles on conduction pathways. As the signals pass down these pathways, the ventricles contract and send blood out to the body. About Second-Degree AV Block Second-degree heart block is a serious condition. If you have second-degree AV block, the signals are slowed or interrupted while they travel through the heart. You may start missing beats. Eventually, you may develop complete heart block and may need an artificial pacemaker. There are two types of second-degree AV block:  Mobitz type 1 block usually does not cause symptoms. It rarely requires treatment.  Mobitz type 2 block is more serious. This type may:  Cause more missed heartbeats.  Make the missed beats very irregular.  Lead to complete heart block. This is a dangerous condition that requires emergency treatment. CAUSES In some cases, a person is born with heart  block. More often, the condition develops over time. Second-degree heart block may be caused by:  Any condition that damages the heart's conducting system.  Some medicines that slow down the heart rate. RISK FACTORS The risk for this condition increases with age. It is also more likely to develop in people who have any of the following conditions:  A heart attack in the past.  Heart failure.  Coronary heart disease.  Inflammation of heart muscle (myocarditis).  Disease of heart muscle (cardiomyopathy).  Infection of the heart valves (endocarditis).  Infections or diseases that affect the heart. These include:  Lyme disease.  Sarcoidosis.  Hemochromatosis.  Rheumatic fever. SYMPTOMS Symptoms depend on the type of second-degree AV block that you have. Type 1 block may not cause any symptoms. Symptoms of type 2 block include:   Fatigue.  Shortness of breath.  Dizziness or light-headedness.  Fainting.  Chest pain. DIAGNOSIS This condition may be diagnosed during a routine physical exam. Your health care provider may suspect a heart block if you have a slow pulse or heartbeat. You may have tests to confirm the diagnosis and to rule out other conditions. These tests may include:  An electrocardiogram (ECG) to check for problems with the electrical activity in your heart.  Wearing a portable ECG device for a few days (Holter monitor) or a few weeks (event monitor). This is done to monitor your heart rate over time.  An electrophysiology (EP) study to test the electrical pathway of your heart. A specialist will place long, thin tubes (catheters) in your heart. The catheters are used to study your heart and record the electrical signals from your heart. TREATMENT Treatment for second-degree  AV block depends on the type of heart block that you have and how bad it is.  Type 1: You may not need treatment.  Type 2: You may need to get a permanent pacemaker. You may also need  treatment for another condition that is causing heart block. It may also be necessary to change or stop taking any heart medicines that could cause heart block. HOME CARE INSTRUCTIONS  Take over-the-counter and prescription medicines only as told by your health care provider.  Work with your health care providers to control all of your risks for heart disease. This may include following these instructions:  Get regular exercise. Ask your health care provider what type of exercise is safe for you.  Eat a heart-healthy diet. Your health care provider or dietitian can help you make healthy choices.  Maintain a healthy weight.  Do not use any tobacco products, including cigarettes, chewing tobacco, or e-cigarettes. If you need help quitting, ask your health care provider.  Limit alcohol intake to no more than 1 drink per day for nonpregnant women and 2 drinks per day for men. One drink equals 12 oz of beer, 5 oz of wine, or 1 oz of hard liquor.  Keep all follow-up visits as told by your health care provider. This is important. SEEK MEDICAL CARE IF:  Your symptoms change or get worse.  You feel as though your heart is skipping beats.  You feel more tired than normal.  You have swelling in your lower legs. SEEK IMMEDIATE MEDICAL CARE IF:  You have chest pain, especially if the pain:  Feels like crushing or pressure.  Spreads to your arms, back, neck, or jaw.  You feel short of breath.  You feel light-headed or weak.  You faint.   This information is not intended to replace advice given to you by your health care provider. Make sure you discuss any questions you have with your health care provider.   Document Released: 07/07/2008 Document Revised: 12/09/2014 Document Reviewed: 06/25/2014 Elsevier Interactive Patient Education [employer].

## 2016-02-16 NOTE — ED Notes (Signed)
Cardiology at bedside.

## 2016-02-16 NOTE — ED Notes (Signed)
Pt verbalized understanding of d/c instructions, prescriptions, and follow-up care. No further questions/concerns, VSS, ambulatory w/ steady gait (refused wheelchair) 

## 2016-02-16 NOTE — ED Provider Notes (Signed)
CSN: [id number]     Arrival date & time 02/16/16  [id number] History   First MD Initiated Contact with Patient 02/16/16 [phone]     Chief Complaint  Patient presents with  . 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 The patient had a CVA a few months ago. He said since that time he has had weakness in his right side. He says it has gotten somewhat better since that time but is still an issue. He reports recently he's had some progressive shortness of breath and that he has been having intermittent episodes of chest pain. He denies cough. No fevers or chills. No leg swelling. The patient has a difficult social situation and has been living in all different places but mainly in hotels. It does not appear that the patient ever had an outpatient event monitor as he was supposed to per cardiology in September. He says he has not taken his medications in a few days. It also appears per review of Epic that the patient was supposed to undergo imaging to re-measure the size of his thoracic aorta but has not had this completed. He was in a nursing facility after his CVA.  Patient is a 55 y.o. male presenting with shortness of breath.  Shortness of Breath Associated symptoms: chest pain (intermittent)   Associated symptoms: no abdominal pain, no cough, no fever, no headaches, no rash, no vomiting and no wheezing     Past Medical History  Diagnosis Date  . Diabetes mellitus without complication ([place])   . Hypertension   . Renal disorder     pt stated that masses were found on his kidneys  . Renal insufficiency   . Stroke [facility])    Past Surgical History  Procedure Laterality Date  . Knee surgery      bilateral    Family History  Problem Relation Age of Onset  . Diabetes Mellitus II Mother   . Hypertension Mother   . Diabetes Mellitus II Sister   . Hypertension Sister    Social History  Substance Use Topics  . Smoking status: Current Every Day Smoker -- 0.25 packs/day for 20 years    Types: Cigarettes  . Smokeless tobacco: Never Used  . Alcohol Use: No    Review of Systems  Constitutional: Negative for fever, chills and fatigue.  HENT: Negative for congestion, postnasal drip and rhinorrhea.   Eyes: Negative for visual disturbance.  Respiratory: Positive for shortness of breath. Negative for cough and wheezing.   Cardiovascular: Positive for chest pain (intermittent). Negative for palpitations and leg swelling.  Gastrointestinal: Negative for nausea, vomiting, abdominal pain and diarrhea.  Genitourinary: Negative for dysuria, urgency, frequency and hematuria.  Musculoskeletal: Negative for myalgias and back pain.  Skin: Negative for rash.  Neurological: Negative for dizziness, syncope, weakness and headaches.  Hematological: Does not bruise/bleed easily.      Allergies  Clonidine derivatives and Lisinopril  Home Medications   Prior to Admission medications   Medication Sig Start Date End Date Taking? Authorizing Provider  aspirin EC 81 MG tablet Take 81 mg by mouth daily.   Yes Historical Provider, MD  atorvastatin (LIPITOR) 40 MG tablet Take 1 tablet (40 mg total) by mouth daily at 6 PM. 10/20/15  Yes Nishant Dhungel, MD  glipiZIDE ([place])  10 MG tablet Take 10 mg by mouth daily before breakfast.   Yes Historical Provider, MD  amLODipine (NORVASC) 10 MG tablet Take 1 tablet (10 mg total) by mouth daily. 02/16/16   Harvel Quale, MD   BP 155/109 mmHg  Pulse 55  Temp(Src) 97.9 F (36.6 C) (Oral)  Resp 16  SpO2 100% Physical Exam  Constitutional: He is oriented to person, place, and time. He appears well-developed and well-nourished. No distress.  HENT:  Head:  Normocephalic and atraumatic.  Right Ear: External ear normal.  Left Ear: External ear normal.  Mouth/Throat: Oropharynx is clear and moist. No oropharyngeal exudate.  Eyes: EOM are normal. Pupils are equal, round, and reactive to light.  Neck: Normal range of motion. Neck supple.  Cardiovascular: Normal heart sounds and intact distal pulses.  An irregular rhythm present. Bradycardia present.   No murmur heard. Pulmonary/Chest: Effort normal. No respiratory distress. He has no wheezes. He has no rales.  Abdominal: Soft. He exhibits no distension. There is no tenderness.  Musculoskeletal: He exhibits no edema.  Neurological: He is alert and oriented to person, place, and time.  Right arm and leg weaker than the left. Patient is able to lift the right leg and hold it briefly against gravity. Patient is able to grasp with his right hand and move his right arm although it appears weaker compared to the left. No facial asymmetry.  Skin: Skin is warm and dry. No rash noted. He is not diaphoretic.  Vitals reviewed.   ED Course  Procedures (including critical care time) Labs Review Labs Reviewed  CBC WITH DIFFERENTIAL/PLATELET - Abnormal; Notable for the following:    WBC 3.0 (*)    Platelets 148 (*)    Neutro Abs 1.4 (*)    All other components within normal limits  BASIC METABOLIC PANEL  MAGNESIUM  CBG MONITORING, ED  I-STAT TROPOININ, ED  Randolm Idol, ED    Imaging Review Dg Chest Portable 1 View  02/16/2016  CLINICAL DATA:  Diffuse pain. EXAM: PORTABLE CHEST 1 VIEW COMPARISON:  02/08/2016. FINDINGS: Normal sized heart. Clear lungs. The small area of increased density previously seen in the right upper lung zone is not currently visualized. Tortuous aorta and mild scoliosis. IMPRESSION: No acute abnormality. Electronically Signed   By: Claudie Revering M.D.   On: 02/16/2016 07:55   Ct Angio Chest Aorta W/cm &/or Wo/cm  02/16/2016  CLINICAL DATA:  Chest pain and shortness of breath  for an indeterminate amount of time. History of aortic aneurysm. EXAM: CT ANGIOGRAPHY CHEST WITH CONTRAST TECHNIQUE: Multidetector CT imaging of the chest was performed using the standard protocol during bolus administration of intravenous contrast. Multiplanar CT image reconstructions and MIPs were obtained to evaluate the vascular anatomy. CONTRAST:  80 cc Isovue 370. COMPARISON:  CT chest 04/24/2015. FINDINGS: Mild aneurysmal dilatation of the ascending aorta at 4.6 cm is unchanged. The aorta is otherwise normal in caliber. There is no dissection or hemorrhage. Heart size is upper normal. No pleural or pericardial effusion. No axillary, hilar or mediastinal lymphadenopathy. Calcific coronary artery disease identified as on the prior exam. The lungs show only mild dependent atelectatic change. Visualized upper abdomen shows an unchanged hemangioma in the left lobe of the liver. Bilateral renal cysts are identified. Bilateral adrenal adenomas are also again seen. No lytic or sclerotic bony lesion is identified. Review of the MIP images confirms the above findings. IMPRESSION: No acute abnormality. No change in a 4.6 cm ascending thoracic aortic aneurysm.  Recommend semi-annual imaging followup by CTA or MRA and referral to cardiothoracic surgery if not already obtained. This recommendation follows 2010 ACCF/AHA/AATS/ACR/ASA/SCA/SCAI/SIR/STS/SVM Guidelines for the Diagnosis and Management of Patients With Thoracic Aortic Disease. Circulation. 2010; 121: [id number] Calcific coronary artery disease. Bilateral adrenal adenomas. Hemangioma left lobe of the liver. Electronically Signed   By: Inge Rise M.D.   On: 02/16/2016 09:18   I have personally reviewed and evaluated these images and lab results as part of my medical decision-making.   EKG Interpretation   Date/Time:  Tuesday February 16 2016 06:52:17 EDT Ventricular Rate:  44 PR Interval:    QRS Duration: 102 QT Interval:  455 QTC Calculation: 390 R  Axis:   37 Text Interpretation:  Second degree AV block, Mobitz I Atrial premature  complexes in couplets Probable anteroseptal infarct, old Minimal ST  elevation, inferior leads Rhythm change since previous tracing Confirmed  by Calloway Andrus ([id number]) on 02/16/2016 7:00:46 AM Also confirmed by Alfonse Spruce,  Kupono Marling ([id number]), editor Gilford Rile, CCT, [place] ([id number])  on 02/16/2016 7:52:52 AM      MDM  Patient was seen and evaluated in stable condition. Patient with Mobitz 1 AV block on his EKG. On the monitor appears to go back and forth between Mobitz 1 and 2. Laboratory studies and chest x-ray unremarkable for patient. CTA revealed stable aortic aneurysm. Patient has been very noncompliant outpatient with follow-up despite help from social work and care management. He has not been taking his medications. He has been offered help in many ways but has never followed through. Cardiology came and saw the patient at bedside. They did not believe that the patient required admission as long as he had a normal serial troponin as this is a chronic issue for the patient. He arranged for him to be seen in their office outpatient. Serial troponin was normal. Discussed all results and plan of care with patient as well as need for outpatient follow-up. Cardiology recommended keeping the patient on his amlodipine at this time. Patient was discharged home in stable condition with instruction of follow-up with cardiology and with a new prescription for his amlodipine. Final diagnoses:  AV block, Mobitz 1    1. AV block, type 2    Harvel Quale, MD 02/16/16 1758

## 2016-02-16 NOTE — ED Notes (Signed)
Ordered diet tray 

## 2016-02-16 NOTE — Consult Note (Signed)
CARDIOLOGY CONSULT NOTE   Patient ID: Javier Rivera MRN: [id number] DOB/AGE: 1960-11-21 55 y.o.  Admit date: 02/16/2016  Primary Physician   No PCP Per Patient Primary Cardiologist: new Requesting MD: Dr. Alfonse Spruce Reason for Consultation: Chest pain   HPI: Javier Rivera is a 55 year old male with a past medical history of DM, HTN, renal insufficieny, second degree AV block with episodes of Mobitz 1 and 2 in the past (seen by EP during hospitalization in Sept. 2016, no need for PPM) and CVA (march 2017). Has aortic root dilatation that was noted on CT in September 2016, unchanged by CT today.   He presents today to the ED with multiple complaints. He reports intermittent chest pain, but does not know when his chest pain started. When I ask him if his chest pain occurs with exertion or at rest he says "hard to say". He says that he has some shortness of breath but can't remember when that started either. He is a very difficult historian. He denies associated symptoms of diaphoresis, nausea, or vomiting when he does have chest pain.  He says he feels like it's hard to swallow his food and reports having trouble with constipation. Sometimes going up to 2 weeks without a bowel movement. He says he no longer enjoys his food because his stomach will not tolerate it.  Upon arrival to the ED his heart rate was in the 30s to 40s. EKG shows Mobitz type I. He has a history of this. He is also hypertensive with BP in 160-180's/100's. He tells me he has not taken any blood pressure meds in over a week. There are multiple social issues, he is homeless. He usually gets his medicines from a "resource center", but tells me that this isn't always reliable.   He has a family history of CAD, his dad has a history of MI. He is unsure of other family history.    Past Medical History  Diagnosis Date  . Diabetes mellitus without complication ([place])   . Hypertension   . Renal disorder     pt stated that masses  were found on his kidneys  . Renal insufficiency   . Stroke [facility])      Past Surgical History  Procedure Laterality Date  . Knee surgery      bilateral    Allergies  Allergen Reactions  . Clonidine Derivatives     Pt reported the sweats and shakes  . Lisinopril Palpitations    "made me real sick, made my heart rate scatter"    I have reviewed the patient's current medications   . magnesium sulfate 1 - 4 g bolus IVPB       Prior to Admission medications   Medication Sig Start Date End Date Taking? Authorizing Provider  amLODipine (NORVASC) 10 MG tablet Take 10 mg by mouth daily. 12/03/15  Yes Historical Provider, MD  aspirin EC 81 MG tablet Take 81 mg by mouth daily.   Yes Historical Provider, MD  atorvastatin (LIPITOR) 40 MG tablet Take 1 tablet (40 mg total) by mouth daily at 6 PM. 10/20/15  Yes Nishant Dhungel, MD  glipiZIDE (GLUCOTROL) 10 MG tablet Take 10 mg by mouth daily before breakfast.   Yes Historical Provider, MD     Social History   Social History  . Marital Status: Single    Spouse Name: N/A  . Number of Children: N/A  . Years of Education: N/A   Occupational History  . Not  on file.   Social History Main Topics  . Smoking status: Current Every Day Smoker -- 0.25 packs/day for 20 years    Types: Cigarettes  . Smokeless tobacco: Never Used  . Alcohol Use: No  . 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 Sexual Activity: Not on file   Other Topics Concern  . Not on file   Social History Narrative    Family Status  Relation Status Death Age  . Mother Deceased   . Father Deceased    Family History  Problem Relation Age of Onset  . Diabetes Mellitus II Mother   . Hypertension Mother   . Diabetes Mellitus II Sister   . Hypertension Sister      ROS:  Full 14 point review of systems complete and found to be negative unless listed above.  Physical Exam: Blood pressure 153/98, pulse 58, temperature 97.9 F (36.6 C),  temperature source Oral, resp. rate 10, SpO2 98 %.  General: Well developed, well nourished, male in no acute distress Head: Eyes PERRLA, No xanthomas. Normocephalic and atraumatic, oropharynx without edema or exudate.  Lungs: CTA  Heart: HRRR S1 S2, no rub/gallop, No murmur. pulses are 2+ extrem.   Neck: No carotid bruits. No lymphadenopathy.  No JVD. Abdomen: Bowel sounds present, abdomen soft and non-tender without masses or hernias noted. Msk:  No spine or cva tenderness. No weakness, no joint deformities or effusions. Extremities: No clubbing or cyanosis.  No edema.  Neuro: Alert and oriented X 3. No focal deficits noted. Psych:  Good affect, responds appropriately Skin: No rashes or lesions noted.  Labs:   Lab Results  Component Value Date   WBC 3.0* 02/16/2016   HGB 15.6 02/16/2016   HCT 46.9 02/16/2016   MCV 90.2 02/16/2016   PLT 148* 02/16/2016    Recent Labs Lab 02/16/16 0720  NA 140  K 3.7  CL 105  CO2 30  BUN 8  CREATININE 1.15  CALCIUM 8.9  GLUCOSE 95   MAGNESIUM  Date Value Ref Range Status  02/16/2016 1.8 1.7 - 2.4 mg/dL Final    Recent Labs  02/16/16 0733  TROPIPOC 0.01    Echo: March 2017 Study Conclusions  - Left ventricle: The cavity size was normal. Systolic function was  normal. The estimated ejection fraction was in the range of 55%  to 60%. Wall motion was normal; there were no regional wall  motion abnormalities. - Aortic valve: There was mild regurgitation. - Aorta: Consider repeat CT scan to measure aortic root (prior  04/2015). Aortic root dimension: 55 mm (ED). - Ascending aorta: The ascending aorta was severely dilated.  Impressions:  - No cardiac source of emboli was indentified. ECG:  Mobitz 1, rate 44.   Radiology:  Dg Chest Portable 1 View  02/16/2016  CLINICAL DATA:  Diffuse pain. EXAM: PORTABLE CHEST 1 VIEW COMPARISON:  02/08/2016. FINDINGS: Normal sized heart. Clear lungs. The small area of increased density  previously seen in the right upper lung zone is not currently visualized. Tortuous aorta and mild scoliosis. IMPRESSION: No acute abnormality. Electronically Signed   By: Claudie Revering M.D.   On: 02/16/2016 07:55   Ct Angio Chest Aorta W/cm &/or Wo/cm  02/16/2016  CLINICAL DATA:  Chest pain and shortness of breath for an indeterminate amount of time. History of aortic aneurysm. EXAM: CT ANGIOGRAPHY CHEST WITH CONTRAST TECHNIQUE: Multidetector CT imaging of the chest was performed using the standard  protocol during bolus administration of intravenous contrast. Multiplanar CT image reconstructions and MIPs were obtained to evaluate the vascular anatomy. CONTRAST:  80 cc Isovue 370. COMPARISON:  CT chest 04/24/2015. FINDINGS: Mild aneurysmal dilatation of the ascending aorta at 4.6 cm is unchanged. The aorta is otherwise normal in caliber. There is no dissection or hemorrhage. Heart size is upper normal. No pleural or pericardial effusion. No axillary, hilar or mediastinal lymphadenopathy. Calcific coronary artery disease identified as on the prior exam. The lungs show only mild dependent atelectatic change. Visualized upper abdomen shows an unchanged hemangioma in the left lobe of the liver. Bilateral renal cysts are identified. Bilateral adrenal adenomas are also again seen. No lytic or sclerotic bony lesion is identified. Review of the MIP images confirms the above findings. IMPRESSION: No acute abnormality. No change in a 4.6 cm ascending thoracic aortic aneurysm. Recommend semi-annual imaging followup by CTA or MRA and referral to cardiothoracic surgery if not already obtained. This recommendation follows 2010 ACCF/AHA/AATS/ACR/ASA/SCA/SCAI/SIR/STS/SVM Guidelines for the Diagnosis and Management of Patients With Thoracic Aortic Disease. Circulation. 2010; 121: [id number] Calcific coronary artery disease. Bilateral adrenal adenomas. Hemangioma left lobe of the liver. Electronically Signed   By: Inge Rise  M.D.   On: 02/16/2016 09:18    ASSESSMENT AND PLAN:    Active Problems:   * No active hospital problems. *  1. Atypical chest pain: Patient is unable to tell me when he has chest pain or exactly what the chest pain feels like. EKG is not concerning for ischemia. He is a very difficult historian. He has multiple social issues, including homelessness and poor access to medicines. No need for ischemic evaluation at this time.  2. Mobitz type I: Patient has a history of this, and has been seen by EP in the past. No indication for permanent pacemaker, we will avoid AV nodal blocking agents.  3. Ascending thoracic aortic aneurysm: Stable by CT today. Semi-annual follow up is reccommended.   4. HTN: Has gotten 25mg  hydralazine po. Can add IV prn as needed. On amlodipine outpatient, but has not taken meds in over a week due to social situation.   Signed: Arbutus Leas, NP 02/16/2016 9:56 AM Pager [phone]  Co-Sign MD

## 2016-02-16 NOTE — Progress Notes (Signed)
Wait on creatinine and only scan CTA chest per Javier Rivera.

## 2016-02-17 ENCOUNTER — Emergency Department (HOSPITAL_COMMUNITY)
Admission: EM | Admit: 2016-02-17 | Discharge: 2016-02-17 | Disposition: A | Discharge status: Home / Self Care | Payer: Self-pay | Attending: Emergency Medicine | Admitting: Emergency Medicine

## 2016-02-17 ENCOUNTER — Emergency Department (HOSPITAL_COMMUNITY): Payer: MEDICAID

## 2016-02-17 ENCOUNTER — Encounter (HOSPITAL_COMMUNITY): Payer: Self-pay

## 2016-02-17 DIAGNOSIS — F439 Reaction to severe stress, unspecified: Secondary | ICD-10-CM | POA: Insufficient documentation

## 2016-02-17 DIAGNOSIS — Z8673 Personal history of transient ischemic attack (TIA), and cerebral infarction without residual deficits: Secondary | ICD-10-CM | POA: Insufficient documentation

## 2016-02-17 DIAGNOSIS — I10 Essential (primary) hypertension: Secondary | ICD-10-CM | POA: Insufficient documentation

## 2016-02-17 DIAGNOSIS — F419 Anxiety disorder, unspecified: Secondary | ICD-10-CM | POA: Insufficient documentation

## 2016-02-17 DIAGNOSIS — Z7982 Long term (current) use of aspirin: Secondary | ICD-10-CM | POA: Insufficient documentation

## 2016-02-17 DIAGNOSIS — Z7984 Long term (current) use of oral hypoglycemic drugs: Secondary | ICD-10-CM | POA: Insufficient documentation

## 2016-02-17 DIAGNOSIS — E119 Type 2 diabetes mellitus without complications: Secondary | ICD-10-CM | POA: Insufficient documentation

## 2016-02-17 DIAGNOSIS — F17210 Nicotine dependence, cigarettes, uncomplicated: Secondary | ICD-10-CM | POA: Insufficient documentation

## 2016-02-17 LAB — CBC
HCT: 50.1 % (ref 39.0–52.0)
Hemoglobin: 16.6 g/dL (ref 13.0–17.0)
MCH: 30.1 pg (ref 26.0–34.0)
MCHC: 33.1 g/dL (ref 30.0–36.0)
MCV: 90.8 fL (ref 78.0–100.0)
Platelets: 154 10*3/uL (ref 150–400)
RBC: 5.52 MIL/uL (ref 4.22–5.81)
RDW: 12.7 % (ref 11.5–15.5)
WBC: 3.2 10*3/uL — ABNORMAL LOW (ref 4.0–10.5)

## 2016-02-17 LAB — CBG MONITORING, ED: Glucose-Capillary: 82 mg/dL (ref 65–99)

## 2016-02-17 LAB — I-STAT TROPONIN, ED
TROPONIN I, POC: 0.01 ng/mL (ref 0.00–0.08)
Troponin i, poc: 0 ng/mL (ref 0.00–0.08)

## 2016-02-17 LAB — BASIC METABOLIC PANEL
Anion gap: 7 (ref 5–15)
BUN: 9 mg/dL (ref 6–20)
CO2: 27 mmol/L (ref 22–32)
Calcium: 9.2 mg/dL (ref 8.9–10.3)
Chloride: 105 mmol/L (ref 101–111)
Creatinine, Ser: 1.16 mg/dL (ref 0.61–1.24)
GFR calc Af Amer: >60 mL/min (ref >60)
GFR calc non Af Amer: >60 mL/min (ref >60)
Glucose, Bld: 97 mg/dL (ref 65–99)
Potassium: 3.8 mmol/L (ref 3.5–5.1)
Sodium: 139 mmol/L (ref 135–145)

## 2016-02-17 MED ORDER — ALPRAZOLAM 0.5 MG PO TABS
0.5000 mg | ORAL_TABLET | Freq: Once | ORAL | Status: AC
  Administered 2016-02-17: 0.5 mg via ORAL
  Filled 2016-02-17: qty 1

## 2016-02-17 NOTE — ED Notes (Signed)
Checked pt.bloodsugar 82 mg reported to nurse Levada Dy rn.

## 2016-02-17 NOTE — Discharge Instructions (Signed)
You have been seen today for chest discomfort and increased stress. Your imaging and lab tests showed no acute abnormalities. Follow up with mental health services as soon as possible for counseling and evaluation of possible depression. Follow up with PCP as soon as possible to establish chronic, consistent management. Return to ED should symptoms worsen or you begin to have thoughts of harming yourself or anyone else.

## 2016-02-17 NOTE — ED Provider Notes (Signed)
CSN: [id number]     Arrival date & time 02/17/16  0934 History   First MD Initiated Contact with Patient 02/17/16 0950     Chief Complaint  Patient presents with  . Chest Pain     (Consider location/radiation/quality/duration/timing/severity/associated sxs/prior Treatment) HPI   Javier Rivera is a 55 y.o. male, with a history of DM, hypertension, stroke, and chronic homelessness, presenting to the ED with chest pain and shortness of breath intermittently for the last year, increasing over the last week. Pain is minor and relieved when he is not thinking of stressful thoughts. Pt adds he has been under an increased amount of stress lately. Also endorses constipation. Pt feels anxious. When asked if he has pain patient states, "I have pain everywhere." Pt also states, "Sometimes I just don't know if I have what it takes to keep going." When asked to clarify, pt states, "I have just been going through a lot." Pt states he has not thoughts or desires for self harm. Pt denies fever/chills, N/V, abdominal pain, neuro deficits, recent illness, or any other complaints.    Past Medical History  Diagnosis Date  . Diabetes mellitus without complication ([place])   . Hypertension   . Renal disorder     pt stated that masses were found on his kidneys  . Renal insufficiency   . Stroke [facility])    Past Surgical History  Procedure Laterality Date  . Knee surgery      bilateral   Family History  Problem Relation Age of Onset  . Diabetes Mellitus II Mother   . Hypertension Mother   . Diabetes Mellitus II Sister   . Hypertension Sister    Social History  Substance Use Topics  . Smoking status: Current Every Day Smoker -- 0.25 packs/day for 20 years    Types: Cigarettes  . Smokeless tobacco: Never Used  . Alcohol Use: No    Review of Systems  Constitutional: Negative for fever, chills, diaphoresis and unexpected weight change.  Respiratory: Positive for shortness of breath. Negative for cough and  chest tightness.   Cardiovascular: Positive for chest pain. Negative for palpitations and leg swelling.  Gastrointestinal: Positive for constipation. Negative for nausea, vomiting, abdominal pain and diarrhea.  Genitourinary: Negative for dysuria and flank pain.  Musculoskeletal: Negative for back pain.  Skin: Negative for color change and pallor.  Neurological: Negative for dizziness, syncope, weakness and light-headedness.  All other systems reviewed and are negative.     Allergies  Clonidine derivatives and Lisinopril  Home Medications   Prior to Admission medications   Medication Sig Start Date End Date Taking? Authorizing Provider  amLODipine (NORVASC) 10 MG tablet Take 1 tablet (10 mg total) by mouth daily. 02/16/16  Yes Harvel Quale, MD  aspirin EC 81 MG tablet Take 81 mg by mouth daily.   Yes Historical Provider, MD  atorvastatin (LIPITOR) 40 MG tablet Take 1 tablet (40 mg total) by mouth daily at 6 PM. 10/20/15  Yes Nishant Dhungel, MD  glipiZIDE (GLUCOTROL) 10 MG tablet Take 10 mg by mouth daily before breakfast.   Yes Historical Provider, MD   BP 161/116 mmHg  Pulse 80  Temp(Src) 97.4 F (36.3 C) (Oral)  Resp 20  Ht 6\' 9"  (2.057 m)  Wt 84.369 kg  BMI 19.94 kg/m2  SpO2 93% Physical Exam  Constitutional: He is oriented to person, place, and time. He appears well-developed and well-nourished. No distress.  HENT:  Head: Normocephalic and atraumatic.  Eyes: Conjunctivae and  EOM are normal. Pupils are equal, round, and reactive to light.  Neck: Normal range of motion. Neck supple.  Cardiovascular: Normal rate, regular rhythm, normal heart sounds and intact distal pulses.   Pulmonary/Chest: Effort normal and breath sounds normal. No respiratory distress. He exhibits no tenderness.  No increased work of breathing. Patient speaks in full sentences without difficulty.  Abdominal: Soft. There is no tenderness. There is no guarding.  Musculoskeletal: He exhibits no edema  or tenderness.  Lymphadenopathy:    He has no cervical adenopathy.  Neurological: He is alert and oriented to person, place, and time. He has normal reflexes.  No sensory deficits. Strength 5/5 in all extremities, but weaker on the right than the left. No gait disturbance. Coordination intact. Cranial nerves III-XII grossly intact. No facial droop.  Pt presents with a walker, but is observed to walk unassisted. Pt is noted to use both hands equally.  Skin: Skin is warm and dry. He is not diaphoretic.  Psychiatric: He is withdrawn. He exhibits a depressed mood.  Pt is slower to respond to some questions. Answers are halting and patient appears to be fighting back emotions at times.   Nursing note and vitals reviewed.   ED Course  Procedures (including critical care time) Labs Review Labs Reviewed  CBC - Abnormal; Notable for the following:    WBC 3.2 (*)    All other components within normal limits  BASIC METABOLIC PANEL  I-STAT TROPOININ, ED  CBG MONITORING, ED  Randolm Idol, ED    Imaging Review Dg Chest 2 View  02/17/2016  CLINICAL DATA:  Chest pain for a few days, dizziness, weakness, history diabetes mellitus, hypertension, renal insufficiency EXAM: CHEST  2 VIEW COMPARISON:  02/16/2016 FINDINGS: Normal heart size and pulmonary vascularity. Tortuous thoracic aorta. Minimal chronic bronchitic changes. Lungs clear. No infiltrate, pleural effusion or pneumothorax. Bones unremarkable. IMPRESSION: No acute abnormalities. Electronically Signed   By: Lavonia Dana M.D.   On: 02/17/2016 10:27   Dg Chest Portable 1 View  02/16/2016  CLINICAL DATA:  Diffuse pain. EXAM: PORTABLE CHEST 1 VIEW COMPARISON:  02/08/2016. FINDINGS: Normal sized heart. Clear lungs. The small area of increased density previously seen in the right upper lung zone is not currently visualized. Tortuous aorta and mild scoliosis. IMPRESSION: No acute abnormality. Electronically Signed   By: Claudie Revering M.D.   On:  02/16/2016 07:55   Ct Angio Chest Aorta W/cm &/or Wo/cm  02/16/2016  CLINICAL DATA:  Chest pain and shortness of breath for an indeterminate amount of time. History of aortic aneurysm. EXAM: CT ANGIOGRAPHY CHEST WITH CONTRAST TECHNIQUE: Multidetector CT imaging of the chest was performed using the standard protocol during bolus administration of intravenous contrast. Multiplanar CT image reconstructions and MIPs were obtained to evaluate the vascular anatomy. CONTRAST:  80 cc Isovue 370. COMPARISON:  CT chest 04/24/2015. FINDINGS: Mild aneurysmal dilatation of the ascending aorta at 4.6 cm is unchanged. The aorta is otherwise normal in caliber. There is no dissection or hemorrhage. Heart size is upper normal. No pleural or pericardial effusion. No axillary, hilar or mediastinal lymphadenopathy. Calcific coronary artery disease identified as on the prior exam. The lungs show only mild dependent atelectatic change. Visualized upper abdomen shows an unchanged hemangioma in the left lobe of the liver. Bilateral renal cysts are identified. Bilateral adrenal adenomas are also again seen. No lytic or sclerotic bony lesion is identified. Review of the MIP images confirms the above findings. IMPRESSION: No acute abnormality. No change in  a 4.6 cm ascending thoracic aortic aneurysm. Recommend semi-annual imaging followup by CTA or MRA and referral to cardiothoracic surgery if not already obtained. This recommendation follows 2010 ACCF/AHA/AATS/ACR/ASA/SCA/SCAI/SIR/STS/SVM Guidelines for the Diagnosis and Management of Patients With Thoracic Aortic Disease. Circulation. 2010; 121: [id number] Calcific coronary artery disease. Bilateral adrenal adenomas. Hemangioma left lobe of the liver. Electronically Signed   By: Inge Rise M.D.   On: 02/16/2016 09:18   I have personally reviewed and evaluated these images and lab results as part of my medical decision-making.   EKG Interpretation   Date/Time:  Wednesday February 17 2016 09:42:56 EDT Ventricular Rate:  68 PR Interval:  176 QRS Duration: 86 QT Interval:  392 QTC Calculation: 416 R Axis:   56 Text Interpretation:  Normal sinus rhythm Right atrial enlargement  Anteroseptal infarct , age undetermined Abnormal ECG Confirmed by Wilson Singer   MD, [place] ([id number]) on 02/17/2016 9:52:58 AM      MDM   Final diagnoses:  Anxiety  Stress    Javier Rivera presents with complaint of chronic chest pain and shortness of breath. Patient also endorses feelings of depression.  Low suspicion for ACS. HEART score is 3, indicating low risk for a cardiac event. Wells criteria score is 0, indicating low risk for PE.  Patient is nontoxic appearing, afebrile, not tachycardic, not tachypneic, not hypotensive, maintains SPO2 of 99-100% on room air, and is in no apparent distress. Patient has no signs of sepsis or other serious or life-threatening condition. TTS consult due to the patient's feelings of depression. Counselor and NP recommend outpatient therapy. Resources were provided to the patient with a recommendation for close follow-up. Hypertension noted and patient endorses poor compliance with his medications. No signs of hypertensive emergency. Patient remained pain-free and less anxious following the Xanax. Delta troponins negative. Strict return precautions discussed. Patient contracts for safety. Patient voiced understanding of all instructions, accepts the plan, and is comfortable with discharge.   Note: Patient underwent a CT angiogram of the chest yesterday with no changes since last CT. Serial troponins yesterday were negative. Chart review reveals the patient has frequent visits for chest pain and complaints such as weakness. Patient is chronically homeless, has been seen by social work multiple times, given resources, and admits to not utilizing these resources. Pt reportedly was discharged early this morning from the ED and then checked right back in.    Filed  Vitals:   02/17/16 1130 02/17/16 1200 02/17/16 1215 02/17/16 1230  BP: 179/125 175/108  167/109  Pulse: 53  55 67  Temp:      TempSrc:      Resp: 15 19 18 16   Height:      Weight:      SpO2: 100%  99% 99%    Filed Vitals:   02/17/16 1200 02/17/16 1215 02/17/16 1230 02/17/16 1300  BP: 175/108  167/109 142/100  Pulse:  55 67 66  Temp:      TempSrc:      Resp: 19 18 16 13   Height:      Weight:      SpO2:  99% 99% 99%     Lorayne Bender, PA-C 02/17/16 1342  Virgel Manifold, MD 02/19/16 2343

## 2016-02-17 NOTE — BH Assessment (Signed)
Tele Assessment Note   Javier Rivera is an 55 y.o. male. Pt denies SI/HI. Pt denies AVH. Pt states he has been feeling depressed due to the following stressors: homelessness, loss of job, and physical health concerns. Pt states he made bad decisions in his life that caused many of "hardships" in life. Pt denies previous mental health treatment. Pt denies current SA. Pt denies abuse. Pt states he is not interested in mental health services but he will think about it in the future.  Writer consulted with Heloise Purpura, DNP. Per Heloise Purpura Pt does not meet inpatient criteria. Faxed outpatient resources for homelessness and outpatient resources.   Diagnosis:  F32.9 Unspecified depressive disoder  Past Medical History:  Past Medical History  Diagnosis Date  . Diabetes mellitus without complication ([place])   . Hypertension   . Renal disorder     pt stated that masses were found on his kidneys  . Renal insufficiency   . Stroke [facility])     Past Surgical History  Procedure Laterality Date  . Knee surgery      bilateral    Family History:  Family History  Problem Relation Age of Onset  . Diabetes Mellitus II Mother   . Hypertension Mother   . Diabetes Mellitus II Sister   . Hypertension Sister     Social History:  reports that he has been smoking Cigarettes.  He has a 5 pack-year smoking history. He has never used smokeless tobacco. He reports that he does not drink alcohol or use illicit drugs.  Additional Social History:  Alcohol / Drug Use Pain Medications: Pt denies Prescriptions: Pt denies  Over the Counter: Pt denies History of alcohol / drug use?: No history of alcohol / drug abuse Longest period of sobriety (when/how long): NA  CIWA: CIWA-Ar BP: (!) 179/125 mmHg Pulse Rate: (!) 53 COWS:    PATIENT STRENGTHS: (choose at least two) Average or above average intelligence Communication skills  Allergies:  Allergies  Allergen Reactions  . Clonidine Derivatives     Pt reported the  sweats and shakes  . Lisinopril Palpitations    "made me real sick, made my heart rate scatter"    Home Medications:  (Not in a hospital admission)  OB/GYN Status:  No LMP for male patient.  General Assessment Data Location of Assessment: [facility] ED TTS Assessment: In system Is this a Tele or Face-to-Face Assessment?: Tele Assessment Is this an Initial Assessment or a Re-assessment for this encounter?: Initial Assessment Marital status: Single Maiden name: NA Is patient pregnant?: No Pregnancy Status: No Living Arrangements: Other (Comment) (homeless) Can pt return to current living arrangement?: Yes Admission Status: Voluntary Is patient capable of signing voluntary admission?: Yes Referral Source: Self/Family/Friend Insurance type: Medicaid     Crisis Care Plan Living Arrangements: Other (Comment) (homeless) Legal Guardian: Other: (self) Name of Psychiatrist: NA Name of Therapist: NA  Education Status Is patient currently in school?: No Current Grade: NA Highest grade of school patient has completed: BA Name of school: NA Contact person: NA  Risk to self with the past 6 months Suicidal Ideation: No Has patient been a risk to self within the past 6 months prior to admission? : No Suicidal Intent: No Has patient had any suicidal intent within the past 6 months prior to admission? : No Is patient at risk for suicide?: No Suicidal Plan?: No Has patient had any suicidal plan within the past 6 months prior to admission? : No Access to Means: No What has  been your use of drugs/alcohol within the last 12 months?: NA Previous Attempts/Gestures: No How many times?: 0 Other Self Harm Risks: NA Triggers for Past Attempts: None known Intentional Self Injurious Behavior: None Family Suicide History: No Recent stressful life event(s): Other (Comment) (homeless) Persecutory voices/beliefs?: No Depression: Yes Depression Symptoms: Loss of interest in usual pleasures, Feeling  worthless/self pity, Guilt Substance abuse history and/or treatment for substance abuse?: No Suicide prevention information given to non-admitted patients: Not applicable  Risk to Others within the past 6 months Homicidal Ideation: No Does patient have any lifetime risk of violence toward others beyond the six months prior to admission? : No Thoughts of Harm to Others: No Current Homicidal Intent: No Current Homicidal Plan: No Access to Homicidal Means: No Identified Victim: NA History of harm to others?: No Assessment of Violence: None Noted Violent Behavior Description: NA Does patient have access to weapons?: No Criminal Charges Pending?: No Does patient have a court date: No Is patient on probation?: No  Psychosis Hallucinations: None noted Delusions: None noted  Mental Status Report Appearance/Hygiene: Unremarkable, In hospital gown Eye Contact: Good Motor Activity: Freedom of movement Speech: Logical/coherent Level of Consciousness: Alert Mood: Depressed Affect: Appropriate to circumstance Anxiety Level: None Thought Processes: Coherent, Relevant Judgement: Unimpaired Orientation: Person, Place, Time, Situation Obsessive Compulsive Thoughts/Behaviors: None  Cognitive Functioning Concentration: Normal Memory: Recent Intact, Remote Intact IQ: Average Insight: Good Impulse Control: Good Appetite: Good Weight Loss: 0 Weight Gain: 0 Sleep: Decreased Total Hours of Sleep: 5 Vegetative Symptoms: None  ADLScreening [facility] Assessment Services) Patient's cognitive ability adequate to safely complete daily activities?: Yes Patient able to express need for assistance with ADLs?: Yes Independently performs ADLs?: Yes (appropriate for developmental age)  Prior Inpatient Therapy Prior Inpatient Therapy: No Prior Therapy Dates: NA Prior Therapy Facilty/Provider(s): NA Reason for Treatment: NA  Prior Outpatient Therapy Prior Outpatient Therapy: No Prior Therapy  Dates: NA Prior Therapy Facilty/Provider(s): NA Reason for Treatment: Na Does patient have an ACCT team?: No Does patient have Intensive In-House Services?  : No Does patient have [facility] services? : No Does patient have P4CC services?: No  ADL Screening (condition at time of admission) Patient's cognitive ability adequate to safely complete daily activities?: Yes Is the patient deaf or have difficulty hearing?: No Does the patient have difficulty seeing, even when wearing glasses/contacts?: No Does the patient have difficulty concentrating, remembering, or making decisions?: No Patient able to express need for assistance with ADLs?: Yes Does the patient have difficulty dressing or bathing?: No Independently performs ADLs?: Yes (appropriate for developmental age) Does the patient have difficulty walking or climbing stairs?: No Weakness of Legs: None Weakness of Arms/Hands: None       Abuse/Neglect Assessment (Assessment to be complete while patient is alone) Physical Abuse: Denies Verbal Abuse: Denies Sexual Abuse: Denies Exploitation of patient/patient's resources: Denies Self-Neglect: Denies     Regulatory affairs officer (For Healthcare) Does patient have an advance directive?: No Would patient like information on creating an advanced directive?: No - patient declined information    Additional Information 1:1 In Past 12 Months?: No CIRT Risk: No Elopement Risk: No Does patient have medical clearance?: Yes     Disposition:  Disposition Initial Assessment Completed for this Encounter: Yes Disposition of Patient: Outpatient treatment Type of outpatient treatment: Adult  Hero Kulish D 02/17/2016 12:10 PM

## 2016-02-17 NOTE — ED Notes (Signed)
Patient here with ongoing chest pain and palpitations, seen yesterday in ED for same and discharged home with instructions for follow-up visit with cardiology. States that he has developed left arm pain and numbness since am. Alert and oriented

## 2016-02-17 NOTE — Progress Notes (Addendum)
Patient is chronically homeless who has presented to the ED 7x in the past 6 months. This CSW has seen Patient on several occasions and provided homelessness resources. CSW also attempted placement in an [place], however, due to lack of insurance there were no ALF options available. CSW received consult due to homelessness and repeat ED visits with extra stress lately. CSW and RN Case Manager engaged with Patient at his bedside. Patient reports that he is currently staying at a hotel. CSW provided Patient with homelessness resources including [place], [employer], [place]. Patient reports that he goes to the [facility] for his medical concerns. CSW inquired about the orange card and any involvement in Partnership for [facility]). CSW provided Patient with [employer]'s contact information to schedule an appointment to see if he can qualify for the orange card and assistance with his medical needs and appointments so he does not have to utilize the emergency department. Patient in agreement. Patient denies any further questions or concerns at this time. CSW signing off. Please contact if new need(s) arise.     Emiliano Dyer, LCSW [facility] ED/42M Clinical Social Worker [phone]

## 2016-02-17 NOTE — ED Notes (Signed)
Pt reports bilateral hand tingling.

## 2016-02-22 ENCOUNTER — Encounter (HOSPITAL_COMMUNITY): Payer: Self-pay | Admitting: Emergency Medicine

## 2016-02-22 ENCOUNTER — Emergency Department (HOSPITAL_COMMUNITY)
Admission: EM | Admit: 2016-02-22 | Discharge: 2016-02-22 | Disposition: A | Discharge status: Home / Self Care | Payer: MEDICAID | Attending: Emergency Medicine | Admitting: Emergency Medicine

## 2016-02-22 ENCOUNTER — Emergency Department (HOSPITAL_COMMUNITY): Payer: MEDICAID

## 2016-02-22 DIAGNOSIS — R10819 Abdominal tenderness, unspecified site: Secondary | ICD-10-CM | POA: Insufficient documentation

## 2016-02-22 DIAGNOSIS — Z8673 Personal history of transient ischemic attack (TIA), and cerebral infarction without residual deficits: Secondary | ICD-10-CM | POA: Insufficient documentation

## 2016-02-22 DIAGNOSIS — I10 Essential (primary) hypertension: Secondary | ICD-10-CM | POA: Insufficient documentation

## 2016-02-22 DIAGNOSIS — R202 Paresthesia of skin: Secondary | ICD-10-CM

## 2016-02-22 DIAGNOSIS — Z87891 Personal history of nicotine dependence: Secondary | ICD-10-CM | POA: Insufficient documentation

## 2016-02-22 DIAGNOSIS — Z7982 Long term (current) use of aspirin: Secondary | ICD-10-CM | POA: Insufficient documentation

## 2016-02-22 DIAGNOSIS — E119 Type 2 diabetes mellitus without complications: Secondary | ICD-10-CM | POA: Insufficient documentation

## 2016-02-22 LAB — COMPREHENSIVE METABOLIC PANEL
ALT: 20 U/L (ref 17–63)
AST: 16 U/L (ref 15–41)
Albumin: 4.1 g/dL (ref 3.5–5.0)
Alkaline Phosphatase: 69 U/L (ref 38–126)
Anion gap: 6 (ref 5–15)
BUN: 8 mg/dL (ref 6–20)
CO2: 29 mmol/L (ref 22–32)
Calcium: 9.3 mg/dL (ref 8.9–10.3)
Chloride: 104 mmol/L (ref 101–111)
Creatinine, Ser: 1.17 mg/dL (ref 0.61–1.24)
GFR calc Af Amer: >60 mL/min (ref >60)
GFR calc non Af Amer: >60 mL/min (ref >60)
Glucose, Bld: 120 mg/dL — ABNORMAL HIGH (ref 65–99)
Potassium: 3.8 mmol/L (ref 3.5–5.1)
Sodium: 139 mmol/L (ref 135–145)
Total Bilirubin: 1 mg/dL (ref 0.3–1.2)
Total Protein: 7 g/dL (ref 6.5–8.1)

## 2016-02-22 LAB — CBC WITH DIFFERENTIAL/PLATELET
Basophils Absolute: 0 10*3/uL (ref 0.0–0.1)
Basophils Relative: 0 %
Eosinophils Absolute: 0 10*3/uL (ref 0.0–0.7)
Eosinophils Relative: 1 %
HCT: 49.1 % (ref 39.0–52.0)
Hemoglobin: 16.1 g/dL (ref 13.0–17.0)
Lymphocytes Relative: 43 %
Lymphs Abs: 1.3 10*3/uL (ref 0.7–4.0)
MCH: 29.8 pg (ref 26.0–34.0)
MCHC: 32.8 g/dL (ref 30.0–36.0)
MCV: 90.8 fL (ref 78.0–100.0)
Monocytes Absolute: 0.1 10*3/uL (ref 0.1–1.0)
Monocytes Relative: 5 %
Neutro Abs: 1.5 10*3/uL — ABNORMAL LOW (ref 1.7–7.7)
Neutrophils Relative %: 51 %
Platelets: 156 10*3/uL (ref 150–400)
RBC: 5.41 MIL/uL (ref 4.22–5.81)
RDW: 12.7 % (ref 11.5–15.5)
WBC: 2.9 10*3/uL — ABNORMAL LOW (ref 4.0–10.5)

## 2016-02-22 LAB — CBG MONITORING, ED: GLUCOSE-CAPILLARY: 87 mg/dL (ref 65–99)

## 2016-02-22 NOTE — ED Notes (Signed)
Pt c/o numbness to hands and feet x 1 month. Pt does has history of diabetes.

## 2016-02-22 NOTE — ED Notes (Signed)
Pt staes he understands instructions.
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
## 2016-02-22 NOTE — Discharge Instructions (Signed)
Keep good control of your blood sugars. Please follow-up with your doctor at the [facility] for further evaluation and treatment of your symptoms. Please return to the emergency department if you develop any new or worsening symptoms.   Paresthesia Paresthesia is an abnormal burning or prickling sensation. This sensation is generally felt in the hands, arms, legs, or feet. However, it may occur in any part of the body. Usually, it is not painful. The feeling may be described as:  Tingling or numbness.  Pins and needles.  Skin crawling.  Buzzing.  Limbs falling asleep.  Itching. Most people experience temporary (transient) paresthesia at some time in their lives. Paresthesia may occur when you breathe too quickly (hyperventilation). It can also occur without any apparent cause. Commonly, paresthesia occurs when pressure is placed on a nerve. The sensation quickly goes away after the pressure is removed. For some people, however, paresthesia is a long-lasting (chronic) condition that is caused by an underlying disorder. If you continue to have paresthesia, you may need further medical evaluation. HOME CARE INSTRUCTIONS Watch your condition for any changes. Taking the following actions may help to lessen any discomfort that you are feeling:  Avoid drinking alcohol.  Try acupuncture or massage to help relieve your symptoms.  Keep all follow-up visits as directed by your health care provider. This is important. SEEK MEDICAL CARE IF:  You continue to have episodes of paresthesia.  Your burning or prickling feeling gets worse when you walk.  You have pain, cramps, or dizziness.  You develop a rash. SEEK IMMEDIATE MEDICAL CARE IF:  You feel weak.  You have trouble walking or moving.  You have problems with speech, understanding, or vision.  You feel confused.  You cannot control your bladder or bowel movements.  You have numbness after an injury.  You faint.   This information is  not intended to replace advice given to you by your health care provider. Make sure you discuss any questions you have with your health care provider.   Document Released: 07/15/2002 Document Revised: 12/09/2014 Document Reviewed: 07/21/2014 Elsevier Interactive Patient Education [employer].

## 2016-02-22 NOTE — ED Notes (Signed)
Crackers and peanut butter given 

## 2016-02-22 NOTE — ED Notes (Signed)
Pt ambulated to room from waiting room with Jerene Pitch, Therapist, sports.

## 2016-02-26 ENCOUNTER — Emergency Department (HOSPITAL_COMMUNITY): Payer: Self-pay

## 2016-02-26 ENCOUNTER — Encounter (HOSPITAL_COMMUNITY): Payer: Self-pay | Admitting: *Deleted

## 2016-02-26 ENCOUNTER — Emergency Department (HOSPITAL_COMMUNITY)
Admission: EM | Admit: 2016-02-26 | Discharge: 2016-02-26 | Disposition: A | Discharge status: Home / Self Care | Payer: Self-pay | Attending: Emergency Medicine | Admitting: Emergency Medicine

## 2016-02-26 DIAGNOSIS — Z87891 Personal history of nicotine dependence: Secondary | ICD-10-CM | POA: Insufficient documentation

## 2016-02-26 DIAGNOSIS — Z7984 Long term (current) use of oral hypoglycemic drugs: Secondary | ICD-10-CM | POA: Insufficient documentation

## 2016-02-26 DIAGNOSIS — N390 Urinary tract infection, site not specified: Secondary | ICD-10-CM | POA: Insufficient documentation

## 2016-02-26 DIAGNOSIS — Z79899 Other long term (current) drug therapy: Secondary | ICD-10-CM | POA: Insufficient documentation

## 2016-02-26 DIAGNOSIS — Z7982 Long term (current) use of aspirin: Secondary | ICD-10-CM | POA: Insufficient documentation

## 2016-02-26 DIAGNOSIS — R531 Weakness: Secondary | ICD-10-CM

## 2016-02-26 DIAGNOSIS — I441 Atrioventricular block, second degree: Secondary | ICD-10-CM | POA: Insufficient documentation

## 2016-02-26 DIAGNOSIS — E119 Type 2 diabetes mellitus without complications: Secondary | ICD-10-CM | POA: Insufficient documentation

## 2016-02-26 DIAGNOSIS — I10 Essential (primary) hypertension: Secondary | ICD-10-CM | POA: Insufficient documentation

## 2016-02-26 DIAGNOSIS — Z8673 Personal history of transient ischemic attack (TIA), and cerebral infarction without residual deficits: Secondary | ICD-10-CM | POA: Insufficient documentation

## 2016-02-26 LAB — URINE MICROSCOPIC-ADD ON

## 2016-02-26 LAB — URINALYSIS, ROUTINE W REFLEX MICROSCOPIC
BILIRUBIN URINE: NEGATIVE
Glucose, UA: NEGATIVE mg/dL
Hgb urine dipstick: NEGATIVE
KETONES UR: NEGATIVE mg/dL
Nitrite: POSITIVE — AB
Protein, ur: NEGATIVE mg/dL
Specific Gravity, Urine: 1.014 (ref 1.005–1.030)
pH: 7 (ref 5.0–8.0)

## 2016-02-26 LAB — CBC WITH DIFFERENTIAL/PLATELET
BASOS PCT: 0 %
Basophils Absolute: 0 10*3/uL (ref 0.0–0.1)
EOS ABS: 0.1 10*3/uL (ref 0.0–0.7)
Eosinophils Relative: 2 %
HCT: 47.4 % (ref 39.0–52.0)
Hemoglobin: 15.6 g/dL (ref 13.0–17.0)
Lymphocytes Relative: 31 %
Lymphs Abs: 1.3 10*3/uL (ref 0.7–4.0)
MCH: 30.1 pg (ref 26.0–34.0)
MCHC: 32.9 g/dL (ref 30.0–36.0)
MCV: 91.3 fL (ref 78.0–100.0)
MONO ABS: 0.3 10*3/uL (ref 0.1–1.0)
MONOS PCT: 6 %
NEUTROS PCT: 61 %
Neutro Abs: 2.5 10*3/uL (ref 1.7–7.7)
PLATELETS: 159 10*3/uL (ref 150–400)
RBC: 5.19 MIL/uL (ref 4.22–5.81)
RDW: 12.9 % (ref 11.5–15.5)
WBC: 4.1 10*3/uL (ref 4.0–10.5)

## 2016-02-26 LAB — I-STAT TROPONIN, ED
TROPONIN I, POC: 0 ng/mL (ref 0.00–0.08)
Troponin i, poc: 0 ng/mL (ref 0.00–0.08)

## 2016-02-26 LAB — COMPREHENSIVE METABOLIC PANEL
ALBUMIN: 4.1 g/dL (ref 3.5–5.0)
ALT: 94 U/L — ABNORMAL HIGH (ref 17–63)
ANION GAP: 4 — AB (ref 5–15)
AST: 63 U/L — ABNORMAL HIGH (ref 15–41)
Alkaline Phosphatase: 73 U/L (ref 38–126)
BUN: 12 mg/dL (ref 6–20)
CO2: 28 mmol/L (ref 22–32)
Calcium: 8.7 mg/dL — ABNORMAL LOW (ref 8.9–10.3)
Chloride: 104 mmol/L (ref 101–111)
Creatinine, Ser: 1.01 mg/dL (ref 0.61–1.24)
GFR calc Af Amer: >60 mL/min (ref >60)
GFR calc non Af Amer: >60 mL/min (ref >60)
GLUCOSE: 92 mg/dL (ref 65–99)
POTASSIUM: 4.6 mmol/L (ref 3.5–5.1)
SODIUM: 136 mmol/L (ref 135–145)
Total Bilirubin: 0.7 mg/dL (ref 0.3–1.2)
Total Protein: 7.1 g/dL (ref 6.5–8.1)

## 2016-02-26 LAB — LIPASE, BLOOD: LIPASE: 15 U/L (ref 11–51)

## 2016-02-26 MED ORDER — DEXTROSE 5 % IV SOLN
1.0000 g | Freq: Once | INTRAVENOUS | Status: AC
  Administered 2016-02-26: 1 g via INTRAVENOUS
  Filled 2016-02-26: qty 10

## 2016-02-26 NOTE — Discharge Instructions (Signed)
Take antibiotics as prescribed. Return for worsening symptoms, including passing out, worsening pain, fever, intractable vomiting or any other symptoms concerning to you.  Urinary Tract Infection Urinary tract infections (UTIs) can develop anywhere along your urinary tract. Your urinary tract is your body's drainage system for removing wastes and extra water. Your urinary tract includes two kidneys, two ureters, a bladder, and a urethra. Your kidneys are a pair of bean-shaped organs. Each kidney is about the size of your fist. They are located below your ribs, one on each side of your spine. CAUSES Infections are caused by microbes, which are microscopic organisms, including fungi, viruses, and bacteria. These organisms are so small that they can only be seen through a microscope. Bacteria are the microbes that most commonly cause UTIs. SYMPTOMS  Symptoms of UTIs may vary by age and gender of the patient and by the location of the infection. Symptoms in young women typically include a frequent and intense urge to urinate and a painful, burning feeling in the bladder or urethra during urination. Older women and men are more likely to be tired, shaky, and weak and have muscle aches and abdominal pain. A fever may mean the infection is in your kidneys. Other symptoms of a kidney infection include pain in your back or sides below the ribs, nausea, and vomiting. DIAGNOSIS To diagnose a UTI, your caregiver will ask you about your symptoms. Your caregiver will also ask you to provide a urine sample. The urine sample will be tested for bacteria and white blood cells. White blood cells are made by your body to help fight infection. TREATMENT  Typically, UTIs can be treated with medication. Because most UTIs are caused by a bacterial infection, they usually can be treated with the use of antibiotics. The choice of antibiotic and length of treatment depend on your symptoms and the type of bacteria causing your  infection. HOME CARE INSTRUCTIONS  If you were prescribed antibiotics, take them exactly as your caregiver instructs you. Finish the medication even if you feel better after you have only taken some of the medication.  Drink enough water and fluids to keep your urine clear or pale yellow.  Avoid caffeine, tea, and carbonated beverages. They tend to irritate your bladder.  Empty your bladder often. Avoid holding urine for long periods of time.  Empty your bladder before and after sexual intercourse.  After a bowel movement, women should cleanse from front to back. Use each tissue only once. SEEK MEDICAL CARE IF:   You have back pain.  You develop a fever.  Your symptoms do not begin to resolve within 3 days. SEEK IMMEDIATE MEDICAL CARE IF:   You have severe back pain or lower abdominal pain.  You develop chills.  You have nausea or vomiting.  You have continued burning or discomfort with urination. MAKE SURE YOU:   Understand these instructions.  Will watch your condition.  Will get help right away if you are not doing well or get worse.   This information is not intended to replace advice given to you by your health care provider. Make sure you discuss any questions you have with your health care provider.   Document Released: 05/04/2005 Document Revised: 04/15/2015 Document Reviewed: 09/02/2011 Elsevier Interactive Patient Education [employer].

## 2016-02-26 NOTE — ED Provider Notes (Signed)
CSN: [id number]     Arrival date & time 02/26/16  1127 History   First MD Initiated Contact with Patient 02/26/16 1138     Chief Complaint  Patient presents with  . Weakness  . Shortness of Breath     (Consider location/radiation/quality/duration/timing/severity/associated sxs/prior Treatment) HPI 55 year old male presents with shortness of breath. History of diabetes, hypertension, prior CVA, and homelessness. Has been to the ED multiple times this month for evaluation of multiple different complaints. States he has chronic shortness of breath and chest pain. States that his breathing seems to be worse today. Has had mild nonproductive cough with congestion. No fevers or chills. States that he is took ciprofloxacin for UTI recently, but none of the symptoms are improving in regard to frequency of urine and foul smelling urine. Does not think he is eating or drinking normally. No leg swelling.  Past Medical History  Diagnosis Date  . Diabetes mellitus without complication ([place])   . Hypertension   . Renal disorder     pt stated that masses were found on his kidneys  . Renal insufficiency   . Stroke [facility])    Past Surgical History  Procedure Laterality Date  . Knee surgery      bilateral   Family History  Problem Relation Age of Onset  . Diabetes Mellitus II Mother   . Hypertension Mother   . Diabetes Mellitus II Sister   . Hypertension Sister    Social History  Substance Use Topics  . Smoking status: Former Smoker -- 0.25 packs/day for 20 years    Types: Cigarettes  . Smokeless tobacco: Never Used  . Alcohol Use: No    Review of Systems 10/14 systems reviewed and are negative other than those stated in the HPI    Allergies  Clonidine derivatives and Lisinopril  Home Medications   Prior to Admission medications   Medication Sig Start Date End Date Taking? Authorizing Provider  amLODipine (NORVASC) 10 MG tablet Take 1 tablet (10 mg total) by mouth daily. 02/16/16   Yes Harvel Quale, MD  aspirin EC 81 MG tablet Take 81 mg by mouth daily.   Yes Historical Provider, MD  atorvastatin (LIPITOR) 40 MG tablet Take 1 tablet (40 mg total) by mouth daily at 6 PM. 10/20/15  Yes Nishant Dhungel, MD  glipiZIDE (GLUCOTROL) 10 MG tablet Take 10 mg by mouth daily before breakfast.   Yes Historical Provider, MD   BP 160/107 mmHg  Pulse 55  Temp(Src) 98.5 F (36.9 C) (Oral)  Resp 14  SpO2 99% Physical Exam Physical Exam  Nursing note and vitals reviewed. Constitutional: Well developed, well nourished, non-toxic, and in no acute distress Head: Normocephalic and atraumatic.  Mouth/Throat: Oropharynx is clear and moist.  Neck: Normal range of motion. Neck supple.  Cardiovascular: Normal rate and regular rhythm.   Pulmonary/Chest: Effort normal and breath sounds normal.  Abdominal: Soft. There is no tenderness. There is no rebound and no guarding.  Musculoskeletal: Normal range of motion.  Neurological: Alert, no facial droop, fluent speech, moves all extremities symmetrically Skin: Skin is warm and dry.  Psychiatric: Cooperative  ED Course  Procedures (including critical care time) Labs Review Labs Reviewed  URINALYSIS, ROUTINE W REFLEX MICROSCOPIC (NOT AT [facility]) - Abnormal; Notable for the following:    APPearance CLOUDY (*)    Nitrite POSITIVE (*)    Leukocytes, UA SMALL (*)    All other components within normal limits  COMPREHENSIVE METABOLIC PANEL - Abnormal; Notable for the  following:    Calcium 8.7 (*)    AST 63 (*)    ALT 94 (*)    Anion gap 4 (*)    All other components within normal limits  URINE MICROSCOPIC-ADD ON - Abnormal; Notable for the following:    Squamous Epithelial / LPF 0-5 (*)    Bacteria, UA MANY (*)    All other components within normal limits  URINE CULTURE  CBC WITH DIFFERENTIAL/PLATELET  LIPASE, BLOOD  I-STAT TROPOININ, ED  I-STAT TROPOININ, ED  Randolm Idol, ED    Imaging Review Dg Chest 2 View  02/26/2016   CLINICAL DATA:  Shortness of breath for several months. EXAM: CHEST  2 VIEW COMPARISON:  Radiographs of February 22, 2016. FINDINGS: The heart size and mediastinal contours are within normal limits. Both lungs are clear. No pneumothorax or pleural effusion is noted. The visualized skeletal structures are unremarkable. IMPRESSION: No active cardiopulmonary disease. Electronically Signed   By: Marijo Conception, M.D.   On: 02/26/2016 13:38   I have personally reviewed and evaluated these images and lab results as part of my medical decision-making.   EKG Interpretation   Date/Time:  Friday February 26 2016 12:19:19 EDT Ventricular Rate:  58 PR Interval:    QRS Duration: 99 QT Interval:  410 QTC Calculation: 403 R Axis:   34 Text Interpretation:  Sinus rhythm Ventricular premature complex Prolonged  PR interval similar to prior EKG  Confirmed by Shanigua Gibb MD, Hinton Dyer [id number]) on  02/26/2016 1:29:16 PM      MDM   Final diagnoses:  UTI (lower urinary tract infection)  Second degree heart block  Generalized weakness   Patient has had multiple ED visits over the past 2 months for recurrent symptoms of weakness, shortness of breath, and chest pain. He says that this has been ongoing since then.  He is noted to be intermittently bradycardic but normotensive and mentating normally. 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 On chart review, he has long-standing history of this. Cardiology has seen him once as consult for this, and continues to recommend outpatient follow-up for him.  He has follow-up in August with cardiology. EKG is not acutely ischemic. repeat EKG without dynamic changes. Serial troponins are negative. Still considered low risk chest pain. Chest x-ray without acute cardiopulmonary processes. Basic blood work overall unremarkable. He does have persistent UTI and on chart review has grown Klebsiella that been sensitive to ciprofloxacin. He states that he continues to have symptoms  of dysuria, foul-smelling urine and frequency. We'll treat with Bactrim which his urine culture has been sensitive to. No evidence of systemic illness. Has also recently undergo CTA of the Chest Abdomen and Pelvis with Stable Thoracic and Abdominal Aneurysms 10 days ago. The patient appears reasonably screened and/or stabilized for discharge and I doubt any other medical condition or other [facility] requiring further screening, evaluation, or treatment in the ED at this time prior to discharge. Strict return and follow-up instructions reviewed. He expressed understanding of all discharge instructions and felt comfortable with the plan of care.      Forde Dandy, MD 02/26/16 [phone]

## 2016-02-26 NOTE — ED Notes (Signed)
Per EMS pt coming from [facility] with c/o generalized weakness and shortness of breath since this morning. Per EMS pt was ambulatory with walker, respirations were unlabored and equal, denies pain.

## 2016-02-26 NOTE — Progress Notes (Addendum)
Pt noted with walker (black) in room Pt confirms with CM that he receives medical care from [place] to tell CM how long it has been since he has been seen by [place] Pt noted to have a constant twitching of left hand during Cm interaction with him.   Pt with chronic hx of homeless  CM spoke with pt who confirms uninsured [employer] resident with no pcp.  CM discussed and provided written information to assist pt with determining choice for uninsured accepting pcps, discussed the importance of pcp vs EDP services for f/u care, www.needymeds.org, www.goodrx.com, discounted pharmacies and other [employer] such as [employer], affordable care act, financial assistance, uninsured dental services, [place] med assist, DSS and  health department  Reviewed resources for [employer] uninsured accepting pcps like [facility], family medicine at [employer], [facility], [facility], local urgent care centers, [facility], [facility] family practice, general medical clinics, family services of the [place], [facility] urgent care plus others, medication resources, [facility] out patient pharmacies and housing Pt voiced understanding and appreciation of resources provided  Provided [facility] contact information Placed resources in pt bag at bedside Discussed pt behavior and minimal responses with ED RN who confirms pt presented the same way with her  ED CM made contact with Audrea Muscat who will look for pt for follow up on 02/29/16

## 2016-02-26 NOTE — ED Notes (Signed)
Bed: WA03 Expected date:  Expected time:  Means of arrival:  Comments: EMS- 55 yo SOB

## 2016-02-28 LAB — URINE CULTURE

## 2016-02-29 ENCOUNTER — Telehealth (HOSPITAL_BASED_OUTPATIENT_CLINIC_OR_DEPARTMENT_OTHER): Payer: Self-pay | Admitting: Emergency Medicine

## 2016-02-29 NOTE — Progress Notes (Signed)
ED Antimicrobial Stewardship Positive Culture Follow Up   Javier Rivera is an 55 y.o. male who presented to [facility] on 02/26/2016 with a chief complaint of  Chief Complaint  Patient presents with  . Weakness  . Shortness of Breath    Recent Results (from the past 720 hour(s))  Urine culture     Status: Abnormal   Collection Time: 02/02/16  1:25 PM  Result Value Ref Range Status   Specimen Description URINE, CLEAN CATCH  Final   Special Requests Normal  Final   Culture >=100,000 COLONIES/mL KLEBSIELLA PNEUMONIAE (A)  Final   Report Status 02/04/2016 FINAL  Final   Organism ID, Bacteria KLEBSIELLA PNEUMONIAE (A)  Final      Susceptibility   Klebsiella pneumoniae - MIC*    AMPICILLIN >=32 RESISTANT Resistant     CEFAZOLIN <=4 SENSITIVE Sensitive     CEFTRIAXONE <=1 SENSITIVE Sensitive     CIPROFLOXACIN <=0.25 SENSITIVE Sensitive     GENTAMICIN <=1 SENSITIVE Sensitive     IMIPENEM <=0.25 SENSITIVE Sensitive     NITROFURANTOIN 32 SENSITIVE Sensitive     TRIMETH/SULFA <=20 SENSITIVE Sensitive     AMPICILLIN/SULBACTAM 4 SENSITIVE Sensitive     PIP/TAZO <=4 SENSITIVE Sensitive     * >=100,000 COLONIES/mL KLEBSIELLA PNEUMONIAE  Urine culture     Status: Abnormal   Collection Time: 02/26/16 12:54 PM  Result Value Ref Range Status   Specimen Description URINE, CLEAN CATCH  Final   Special Requests NONE  Final   Culture >=100,000 COLONIES/mL KLEBSIELLA PNEUMONIAE (A)  Final   Report Status 02/28/2016 FINAL  Final   Organism ID, Bacteria KLEBSIELLA PNEUMONIAE (A)  Final      Susceptibility   Klebsiella pneumoniae - MIC*    AMPICILLIN >=32 RESISTANT Resistant     CEFAZOLIN <=4 SENSITIVE Sensitive     CEFTRIAXONE <=1 SENSITIVE Sensitive     CIPROFLOXACIN <=0.25 SENSITIVE Sensitive     GENTAMICIN <=1 SENSITIVE Sensitive     IMIPENEM <=0.25 SENSITIVE Sensitive     NITROFURANTOIN 32 SENSITIVE Sensitive     TRIMETH/SULFA <=20 SENSITIVE Sensitive     AMPICILLIN/SULBACTAM 4 SENSITIVE  Sensitive     PIP/TAZO <=4 SENSITIVE Sensitive     * >=100,000 COLONIES/mL KLEBSIELLA PNEUMONIAE    ED provider note says to treat with Bactrim, but I do not see a prescription on his chart.  Flow manager to contact patient. If he has a Bactrim prescription he should finish it. If he does not have a Bactrim prescription:  Bactrim DS 1 tab PO BID x 7 days  ED Provider: Sula Rumple, PA-C   Norva Riffle 02/29/2016, 10:02 AM Infectious Diseases Pharmacist Phone# [phone]

## 2016-02-29 NOTE — Telephone Encounter (Signed)
Post ED Visit - Positive Culture Follow-up: Successful Patient Follow-Up  Culture assessed and recommendations reviewed by: []  Elenor Quinones, Pharm.D. []  Heide Guile, Pharm.D., BCPS []  Parks Neptune, Pharm.D. []  Alycia Rossetti, Pharm.D., BCPS []  [place], Pharm.D., BCPS, AAHIVP [x]  Legrand Como, Pharm.D., BCPS, AAHIVP []  Milus Glazier, Pharm.D. []  Rob Evette Doffing, Pharm.D.  Positive urine culture  []  Patient discharged without antimicrobial prescription and treatment is now indicated []  Organism is resistant to prescribed ED discharge antimicrobial []  Patient with positive blood cultures  Changes discussed with ED provider: Will Dansie PA If pt has not already been rx Bactrim, start Bactrim DS 1 tab bo bid x 7days  attempting to contact pt   Hazle Nordmann 02/29/2016, 4:04 PM

## 2016-03-04 ENCOUNTER — Encounter (HOSPITAL_COMMUNITY): Payer: Self-pay | Admitting: *Deleted

## 2016-03-04 ENCOUNTER — Emergency Department (HOSPITAL_COMMUNITY): Payer: Self-pay

## 2016-03-04 ENCOUNTER — Emergency Department (HOSPITAL_COMMUNITY)
Admission: EM | Admit: 2016-03-04 | Discharge: 2016-03-04 | Disposition: A | Discharge status: Home / Self Care | Payer: Self-pay | Attending: Emergency Medicine | Admitting: Emergency Medicine

## 2016-03-04 DIAGNOSIS — I10 Essential (primary) hypertension: Secondary | ICD-10-CM | POA: Insufficient documentation

## 2016-03-04 DIAGNOSIS — R5382 Chronic fatigue, unspecified: Secondary | ICD-10-CM | POA: Insufficient documentation

## 2016-03-04 DIAGNOSIS — Z7982 Long term (current) use of aspirin: Secondary | ICD-10-CM | POA: Insufficient documentation

## 2016-03-04 DIAGNOSIS — R202 Paresthesia of skin: Secondary | ICD-10-CM | POA: Insufficient documentation

## 2016-03-04 DIAGNOSIS — Z7984 Long term (current) use of oral hypoglycemic drugs: Secondary | ICD-10-CM | POA: Insufficient documentation

## 2016-03-04 DIAGNOSIS — Z87891 Personal history of nicotine dependence: Secondary | ICD-10-CM | POA: Insufficient documentation

## 2016-03-04 DIAGNOSIS — E1165 Type 2 diabetes mellitus with hyperglycemia: Secondary | ICD-10-CM | POA: Insufficient documentation

## 2016-03-04 DIAGNOSIS — Z79899 Other long term (current) drug therapy: Secondary | ICD-10-CM | POA: Insufficient documentation

## 2016-03-04 DIAGNOSIS — Z8673 Personal history of transient ischemic attack (TIA), and cerebral infarction without residual deficits: Secondary | ICD-10-CM | POA: Insufficient documentation

## 2016-03-04 LAB — CBC
HCT: 46.3 % (ref 39.0–52.0)
Hemoglobin: 15.4 g/dL (ref 13.0–17.0)
MCH: 29.7 pg (ref 26.0–34.0)
MCHC: 33.3 g/dL (ref 30.0–36.0)
MCV: 89.2 fL (ref 78.0–100.0)
PLATELETS: 167 10*3/uL (ref 150–400)
RBC: 5.19 MIL/uL (ref 4.22–5.81)
RDW: 12.7 % (ref 11.5–15.5)
WBC: 2.6 10*3/uL — AB (ref 4.0–10.5)

## 2016-03-04 LAB — URINALYSIS, ROUTINE W REFLEX MICROSCOPIC
Bilirubin Urine: NEGATIVE
GLUCOSE, UA: 100 mg/dL — AB
HGB URINE DIPSTICK: NEGATIVE
KETONES UR: NEGATIVE mg/dL
LEUKOCYTES UA: NEGATIVE
Nitrite: NEGATIVE
PH: 6.5 (ref 5.0–8.0)
PROTEIN: NEGATIVE mg/dL
Specific Gravity, Urine: 1.009 (ref 1.005–1.030)

## 2016-03-04 LAB — I-STAT TROPONIN, ED
Troponin i, poc: 0 ng/mL (ref 0.00–0.08)
Troponin i, poc: 0.01 ng/mL (ref 0.00–0.08)

## 2016-03-04 LAB — BASIC METABOLIC PANEL
Anion gap: 7 (ref 5–15)
BUN: 9 mg/dL (ref 6–20)
CO2: 28 mmol/L (ref 22–32)
CREATININE: 1.23 mg/dL (ref 0.61–1.24)
Calcium: 9.4 mg/dL (ref 8.9–10.3)
Chloride: 106 mmol/L (ref 101–111)
Glucose, Bld: 104 mg/dL — ABNORMAL HIGH (ref 65–99)
POTASSIUM: 4 mmol/L (ref 3.5–5.1)
SODIUM: 141 mmol/L (ref 135–145)

## 2016-03-04 LAB — CBG MONITORING, ED: Glucose-Capillary: 91 mg/dL (ref 65–99)

## 2016-03-04 MED ORDER — SODIUM CHLORIDE 0.9 % IV BOLUS (SEPSIS)
1000.0000 mL | Freq: Once | INTRAVENOUS | Status: AC
  Administered 2016-03-04: 1000 mL via INTRAVENOUS

## 2016-03-04 MED ORDER — GABAPENTIN 100 MG PO CAPS: 100.0000 mg | ORAL_CAPSULE | Freq: Three times a day (TID) | ORAL | 0 refills | Status: DC

## 2016-03-04 MED ORDER — DEXTROSE 5 % IV SOLN: 1.0000 g | Freq: Once | INTRAVENOUS | Status: DC

## 2016-03-04 MED ORDER — AMLODIPINE BESYLATE 5 MG PO TABS
10.0000 mg | ORAL_TABLET | Freq: Once | ORAL | Status: DC
  Filled 2016-03-04: qty 2

## 2016-03-04 MED ORDER — SULFAMETHOXAZOLE-TRIMETHOPRIM 800-160 MG PO TABS: 1.0000 | ORAL_TABLET | Freq: Two times a day (BID) | ORAL | 0 refills | Status: AC

## 2016-03-04 MED ORDER — HYDRALAZINE HCL 20 MG/ML IJ SOLN
10.0000 mg | Freq: Once | INTRAMUSCULAR | Status: AC
  Administered 2016-03-04: 10 mg via INTRAVENOUS
  Filled 2016-03-04: qty 1

## 2016-03-04 MED ORDER — SULFAMETHOXAZOLE-TRIMETHOPRIM 800-160 MG PO TABS
1.0000 | ORAL_TABLET | Freq: Once | ORAL | Status: AC
  Administered 2016-03-04: 1 via ORAL
  Filled 2016-03-04: qty 1

## 2016-03-04 NOTE — ED Notes (Signed)
Pt verbalizes understanding of d/c instructions as well as follow up procedure after d/c.  RX's reviewed with pt, pt verbalizes understanding of RX direction.  Pt assisted out of the ED via [place] at this time.  Pt in no apparent distress at this time.

## 2016-03-04 NOTE — ED Provider Notes (Signed)
[facility] DEPT Provider Note   CSN: [id number] Arrival date & time: 03/04/16  1019  First Provider Contact:  None       History   Chief Complaint Chief Complaint  Patient presents with  . Weakness    HPI Jeremey Diguglielmo is a 55 y.o. male.  HPI   55 year old male with past medical history of hypertension, hyperlipidemia, CVA and recurrent ED visits for generalized weakness who presents with generalized weakness. Patient is a difficult historian, but states that over the last several days he hasis felt generally weak. He describes it as a sensation of being completely out of energy. He initially reported to the ED yesterday and slept in the waiting room. He was escorted out by security. He states that he was nervous about coming to the ED. He states that upon returning home, he felt generally weak and subsequently presents for evaluation. Currently, his primary complaint is bilateral numbness and tingling of his upper and lower extremities in a stocking glove distribution. This is a chronic issue. He has not seen his PCP for this. Denies any associated neck or back pain. He also initially reported numbness in his left leg but on further questioning, this is also a chronic issue and is more so related to the paresthesias. He reports that he was concerned about a stroke, but strongly denies any new focal neurological deficits. Denies any numbness or weakness. Noted difficulty speaking or walking. No other medical complaints. Of note, patient was just treated for UTI. He was discharged with outpatient antibiotics but has not had these filled. Denies any flank pain.   Past Medical History:  Diagnosis Date  . Diabetes mellitus without complication ([place])   . Hypertension   . Renal disorder    pt stated that masses were found on his kidneys  . Renal insufficiency   . Stroke [facility])     Patient Active Problem List   Diagnosis Date Noted  . Thoracic aortic aneurysm without rupture ([place])     . Chest pain   . Hypertensive urgency   . Slow transit constipation 11/25/2015  . Right sided weakness 10/17/2015  . DM type 2, goal A1c below 7 10/17/2015  . Uncontrolled hypertension 10/17/2015  . CVA (cerebral infarction) 10/17/2015  . Sensory disturbance 04/25/2015  . Ascending aorta dilatation ([facility])   . Protein-calorie malnutrition, severe ([place]) 04/24/2015  . Heart block AV second degree 04/23/2015  . Dizziness 04/23/2015  . Diabetes mellitus type 2, controlled ([place]) 04/23/2015  . Essential hypertension 04/23/2015  . Tobacco abuse 04/23/2015  . AV block, 2nd degree   . SEBORRHEIC DERMATITIS 05/07/2010  . RECTAL BLEEDING 08/14/2009  . DIABETES MELLITUS, TYPE II, UNCONTROLLED 06/11/2009  . Hyperlipidemia 06/11/2009  . Cocaine abuse 06/11/2009  . ESSENTIAL HYPERTENSION 06/11/2009  . DIASTOLIC DYSFUNCTION [id number]  . RENAL INSUFFICIENCY 06/11/2009    Past Surgical History:  Procedure Laterality Date  . KNEE SURGERY     bilateral       Home Medications    Prior to Admission medications   Medication Sig Start Date End Date Taking? Authorizing Provider  amLODipine (NORVASC) 10 MG tablet Take 1 tablet (10 mg total) by mouth daily. 02/16/16  Yes Harvel Quale, MD  aspirin EC 81 MG tablet Take 81 mg by mouth daily.   Yes Historical Provider, MD  atorvastatin (LIPITOR) 40 MG tablet Take 1 tablet (40 mg total) by mouth daily at 6 PM. 10/20/15  Yes Nishant Dhungel, MD  glipiZIDE (GLUCOTROL) 10 MG  tablet Take 10 mg by mouth daily before breakfast.   Yes Historical Provider, MD  gabapentin (NEURONTIN) 100 MG capsule Take 1 capsule (100 mg total) by mouth 3 (three) times daily. 03/04/16 03/14/16  Duffy Bruce, MD  sulfamethoxazole-trimethoprim (BACTRIM DS,SEPTRA DS) 800-160 MG tablet Take 1 tablet by mouth 2 (two) times daily. 03/04/16 03/11/16  Duffy Bruce, MD    Family History Family History  Problem Relation Age of Onset  . Diabetes Mellitus II Mother   . Hypertension  Mother   . Diabetes Mellitus II Sister   . Hypertension Sister     Social History Social History  Substance Use Topics  . Smoking status: Former Smoker    Packs/day: 0.25    Years: 20.00    Types: Cigarettes  . Smokeless tobacco: Never Used  . Alcohol use No     Allergies   Clonidine derivatives and Lisinopril   Review of Systems Review of Systems  Constitutional: Positive for fatigue. Negative for chills and fever.  HENT: Negative for congestion and rhinorrhea.   Eyes: Negative for visual disturbance.  Respiratory: Negative for cough, shortness of breath and wheezing.   Cardiovascular: Negative for chest pain and leg swelling.  Gastrointestinal: Negative for abdominal pain, diarrhea, nausea and vomiting.  Genitourinary: Positive for dysuria. Negative for flank pain.  Musculoskeletal: Negative for neck pain and neck stiffness.  Skin: Negative for rash.  Allergic/Immunologic: Negative for immunocompromised state.  Neurological: Positive for weakness and numbness. Negative for dizziness, light-headedness and headaches.     Physical Exam Updated Vital Signs BP 137/88   Pulse 66   Temp 98.4 F (36.9 C) (Oral)   Resp 18   Wt 194 lb 4 oz (88.1 kg)   SpO2 100%   BMI 20.82 kg/m   Physical Exam  Constitutional: He is oriented to person, place, and time. He appears well-developed and well-nourished. No distress.  HENT:  Head: Normocephalic and atraumatic.  Mouth/Throat: Oropharynx is clear and moist. No oropharyngeal exudate.  Eyes: Conjunctivae are normal. Pupils are equal, round, and reactive to light.  Neck: Normal range of motion. Neck supple.  Cardiovascular: Normal rate, regular rhythm and normal heart sounds.  Exam reveals no friction rub.   No murmur heard. Pulmonary/Chest: Effort normal and breath sounds normal. No respiratory distress. He has no wheezes. He has no rales.  Abdominal: Soft. Bowel sounds are normal. He exhibits no distension. There is no  tenderness. There is no rebound and no guarding.  No CVAT  Musculoskeletal: He exhibits no edema.  Neurological: He is alert and oriented to person, place, and time. He has normal strength. A sensory deficit (subjectively decreased sensation to light touch in stocking glove distribution in UE and LE bilaterally) is present. No cranial nerve deficit. He exhibits normal muscle tone. Coordination and gait normal. GCS eye subscore is 4. GCS verbal subscore is 5. GCS motor subscore is 6.  Skin: Skin is warm. Capillary refill takes less than 2 seconds. No rash noted.  Nursing note and vitals reviewed.    ED Treatments / Results  Labs (all labs ordered are listed, but only abnormal results are displayed) Labs Reviewed  BASIC METABOLIC PANEL - Abnormal; Notable for the following:       Result Value   Glucose, Bld 104 (*)    All other components within normal limits  CBC - Abnormal; Notable for the following:    WBC 2.6 (*)    All other components within normal limits  URINALYSIS, ROUTINE W  REFLEX MICROSCOPIC (NOT AT [facility]) - Abnormal; Notable for the following:    Glucose, UA 100 (*)    All other components within normal limits  URINE CULTURE  CBG MONITORING, ED  I-STAT TROPOININ, ED  Randolm Idol, ED    EKG  EKG Interpretation None       Radiology Dg Chest 2 View  Result Date: 03/04/2016 CLINICAL DATA:  Generalized weakness and lethargy for several days. EXAM: CHEST  2 VIEW COMPARISON:  PA and lateral chest 02/26/2016.  CT chest 02/16/2016. FINDINGS: The lungs are clear. Heart size is normal. There is no pneumothorax or pleural effusion. No bony abnormality is identified. IMPRESSION: Negative chest. Electronically Signed   By: Inge Rise M.D.   On: 03/04/2016 14:02  Ct Head Wo Contrast  Result Date: 03/04/2016 CLINICAL DATA:  55 year old male with acute right-sided weakness and headache today. History of left pontine infarct. EXAM: CT HEAD WITHOUT CONTRAST TECHNIQUE:  Contiguous axial images were obtained from the base of the skull through the vertex without intravenous contrast. COMPARISON:  10/18/2015 CT and prior studies. FINDINGS: Moderate chronic small-vessel white matter ischemic changes again noted. No acute intracranial abnormalities are identified, including mass lesion or mass effect, hydrocephalus, extra-axial fluid collection, midline shift, hemorrhage, or acute infarction. The visualized bony calvarium is unremarkable. IMPRESSION: No evidence of acute intracranial abnormality. Chronic small-vessel white matter ischemic changes. Electronically Signed   By: Margarette Canada M.D.   On: 03/04/2016 14:21   Procedures Procedures (including critical care time)  Medications Ordered in ED Medications  sodium chloride 0.9 % bolus 1,000 mL (0 mLs Intravenous Stopped 03/04/16 1605)  sulfamethoxazole-trimethoprim (BACTRIM DS,SEPTRA DS) 800-160 MG per tablet 1 tablet (1 tablet Oral Given 03/04/16 1703)  hydrALAZINE (APRESOLINE) injection 10 mg (10 mg Intravenous Given 03/04/16 1702)     Initial Impression / Assessment and Plan / ED Course  I have reviewed the triage vital signs and the nursing notes.  Pertinent labs & imaging results that were available during my care of the patient were reviewed by me and considered in my medical decision making (see chart for details).  Clinical Course    55 year old male with past medical history as above who presents with generalized weakness. On arrival, patient is afebrile, well-appearing and in no acute distress. His vital signs are within normal limits for the patient, with marked hypertension that is unchanged from his baseline. Regarding his multiple complaints, these are all primarily chronic in nature. Patient has had extensive ED visits for the same and has been referred to a PCP as well as with case management, multiple times. He has failed to show up multiple times. Regarding his paresthesias and reported numbness,  these are all chronic and I suspect are secondary to diabetic neuropathy. He describes neuropathic pain in a stocking glove distribution. He has no focal, new neurological deficits on my examination to suggest CVA or other intracranial process. CT of the head is negative. Discussed management options with the patient. Given that this is his primary concern, he would like to start medication for this. I discussed the risks and benefits of doing so and will prescribe a brief course of gabapentin with outpatient follow-up. Otherwise, regarding his generalized weakness, labs are otherwise unremarkable. His delta troponin is negative. He does have intermittent bradycardia with this is chronic for him and he's been extensively evaluated by cardiology for this. His urine does appear clean currently, but denies any antibiotic use since his last visit and his urine culture  did grow Klebsiella.  Per review of records and cultures, will give Bactrim for possible partially treated UTI, although I have a low suspicion is this is contributed to his generalized weakness. No CVA tenderness to suggest pyelonephritis. Otherwise, blood pressure is at baseline and patient is well-appearing. Will discharge with outpatient follow-up. Do not suspect acute, emergent pathology.  Final Clinical Impressions(s) / ED Diagnoses   Final diagnoses:  Paresthesias  Chronic fatigue  Poorly-controlled hypertension    New Prescriptions Discharge Medication List as of 03/04/2016  5:22 PM    START taking these medications   Details  gabapentin (NEURONTIN) 100 MG capsule Take 1 capsule (100 mg total) by mouth 3 (three) times daily., Starting Fri 03/04/2016, Until Mon 03/14/2016, Print         Duffy Bruce, MD 03/04/16 [phone]

## 2016-03-04 NOTE — ED Notes (Signed)
CBG 91 

## 2016-03-04 NOTE — ED Notes (Signed)
Pt's HR is 53, and BP is 183/117. Informed Heather Roberts, RN.

## 2016-03-04 NOTE — ED Notes (Signed)
Lunch meal given. 

## 2016-03-04 NOTE — ED Triage Notes (Signed)
Pt reports "feeling like he is having a stroke." pt reprots hx of stroke that affected right side, now states that his left leg is "feeling different" x 2 days. Pt was seen sleeping in lobby yesterday but states that he did not check in bc "he was too scared." no neuro deficits noted at triage. Also states he has pain and discomfort to right side head and eye.

## 2016-03-04 NOTE — ED Triage Notes (Signed)
Pt ambulating well in the lobby.  No speech or other stroke deficits noted at time of check in.

## 2016-03-05 LAB — URINE CULTURE

## 2016-03-15 ENCOUNTER — Emergency Department (HOSPITAL_COMMUNITY)
Admission: EM | Admit: 2016-03-15 | Discharge: 2016-03-15 | Disposition: A | Discharge status: Home / Self Care | Payer: Self-pay | Attending: Emergency Medicine | Admitting: Emergency Medicine

## 2016-03-15 ENCOUNTER — Encounter (HOSPITAL_COMMUNITY): Payer: Self-pay | Admitting: Neurology

## 2016-03-15 ENCOUNTER — Emergency Department (HOSPITAL_COMMUNITY): Payer: Self-pay

## 2016-03-15 DIAGNOSIS — E119 Type 2 diabetes mellitus without complications: Secondary | ICD-10-CM | POA: Insufficient documentation

## 2016-03-15 DIAGNOSIS — Z7982 Long term (current) use of aspirin: Secondary | ICD-10-CM | POA: Insufficient documentation

## 2016-03-15 DIAGNOSIS — R079 Chest pain, unspecified: Secondary | ICD-10-CM

## 2016-03-15 DIAGNOSIS — Z87891 Personal history of nicotine dependence: Secondary | ICD-10-CM | POA: Insufficient documentation

## 2016-03-15 DIAGNOSIS — Z7984 Long term (current) use of oral hypoglycemic drugs: Secondary | ICD-10-CM | POA: Insufficient documentation

## 2016-03-15 DIAGNOSIS — I10 Essential (primary) hypertension: Secondary | ICD-10-CM

## 2016-03-15 DIAGNOSIS — Z8673 Personal history of transient ischemic attack (TIA), and cerebral infarction without residual deficits: Secondary | ICD-10-CM | POA: Insufficient documentation

## 2016-03-15 LAB — TROPONIN I: Troponin I: <0.03 ng/mL (ref <0.03)

## 2016-03-15 LAB — BASIC METABOLIC PANEL
ANION GAP: 8 (ref 5–15)
BUN: 10 mg/dL (ref 6–20)
CALCIUM: 9.3 mg/dL (ref 8.9–10.3)
CO2: 25 mmol/L (ref 22–32)
CREATININE: 1.07 mg/dL (ref 0.61–1.24)
Chloride: 104 mmol/L (ref 101–111)
GFR calc Af Amer: >60 mL/min (ref >60)
GLUCOSE: 83 mg/dL (ref 65–99)
Potassium: 3.9 mmol/L (ref 3.5–5.1)
Sodium: 137 mmol/L (ref 135–145)

## 2016-03-15 LAB — CBC
HCT: 49.3 % (ref 39.0–52.0)
Hemoglobin: 16.3 g/dL (ref 13.0–17.0)
MCH: 29.7 pg (ref 26.0–34.0)
MCHC: 33.1 g/dL (ref 30.0–36.0)
MCV: 90 fL (ref 78.0–100.0)
PLATELETS: 147 10*3/uL — AB (ref 150–400)
RBC: 5.48 MIL/uL (ref 4.22–5.81)
RDW: 12.7 % (ref 11.5–15.5)
WBC: 2.6 10*3/uL — ABNORMAL LOW (ref 4.0–10.5)

## 2016-03-15 LAB — CBG MONITORING, ED
Glucose-Capillary: 69 mg/dL (ref 65–99)
Glucose-Capillary: 119 mg/dL — ABNORMAL HIGH (ref 65–99)

## 2016-03-15 LAB — I-STAT TROPONIN, ED: TROPONIN I, POC: 0.19 ng/mL — AB (ref 0.00–0.08)

## 2016-03-15 MED ORDER — NITROGLYCERIN IN D5W 200-5 MCG/ML-% IV SOLN
0.0000 ug/min | Freq: Once | INTRAVENOUS | Status: AC
  Administered 2016-03-15: 5 ug/min via INTRAVENOUS
  Filled 2016-03-15: qty 250

## 2016-03-15 MED ORDER — HYDRALAZINE HCL 20 MG/ML IJ SOLN
10.0000 mg | Freq: Once | INTRAMUSCULAR | Status: AC
  Administered 2016-03-15: 10 mg via INTRAVENOUS
  Filled 2016-03-15: qty 1

## 2016-03-15 MED ORDER — HYDROMORPHONE HCL 1 MG/ML IJ SOLN
1.0000 mg | Freq: Once | INTRAMUSCULAR | Status: AC
  Administered 2016-03-15: 1 mg via INTRAVENOUS
  Filled 2016-03-15: qty 1

## 2016-03-15 MED ORDER — HYDRALAZINE HCL 20 MG/ML IJ SOLN
20.0000 mg | Freq: Once | INTRAMUSCULAR | Status: AC
  Administered 2016-03-15: 20 mg via INTRAVENOUS
  Filled 2016-03-15: qty 1

## 2016-03-15 MED ORDER — ONDANSETRON HCL 4 MG/2ML IJ SOLN
4.0000 mg | Freq: Once | INTRAMUSCULAR | Status: AC
Start: 2016-03-15 — End: 2016-03-15
  Administered 2016-03-15: 4 mg via INTRAVENOUS
  Filled 2016-03-15: qty 2

## 2016-03-15 MED ORDER — ASPIRIN 81 MG PO CHEW
324.0000 mg | CHEWABLE_TABLET | Freq: Once | ORAL | Status: AC
  Administered 2016-03-15: 324 mg via ORAL
  Filled 2016-03-15: qty 4

## 2016-03-15 MED ORDER — ONDANSETRON HCL 4 MG/2ML IJ SOLN
4.0000 mg | Freq: Once | INTRAMUSCULAR | Status: AC
  Administered 2016-03-15: 4 mg via INTRAVENOUS
  Filled 2016-03-15: qty 2

## 2016-03-15 MED ORDER — MORPHINE SULFATE (PF) 4 MG/ML IV SOLN: 4.0000 mg | INTRAVENOUS | Status: DC | PRN

## 2016-03-15 MED ORDER — AMLODIPINE BESYLATE 5 MG PO TABS
10.0000 mg | ORAL_TABLET | Freq: Once | ORAL | Status: AC
  Administered 2016-03-15: 10 mg via ORAL
  Filled 2016-03-15: qty 2

## 2016-03-15 NOTE — ED Triage Notes (Signed)
Pt here for CP, SOB  For "awhile". Pt does not want to talk to RN about sx. Is ambulatory with walker. Denies n/v. Difficult to gather details of pt complaint b/c he is not forthcoming.

## 2016-03-15 NOTE — ED Notes (Signed)
D/C instructions reviewed with pt. And he denies any questions.

## 2016-03-15 NOTE — ED Notes (Signed)
CBG 69. Pt given 2 small cups apple juice and graham crackers,

## 2016-03-15 NOTE — ED Provider Notes (Signed)
[facility] DEPT Provider Note   CSN: [id number] Arrival date & time: 03/15/16  1015  First Provider Contact:  None       History   Chief Complaint Chief Complaint  Patient presents with  . Chest Pain    HPI Javier Rivera is a 55 y.o. male. He has multiple complaints. As I speak with him it takes him several minutes to produce a chief complaint. He states that when he lays in bed too long history get numb. He has frequent chest pain. He sometimes feels short of breath but not today.  He denies fever or cough. No pain in his scapular ribs. His chronic low back pain. No incontinence. No numbness or weakness to his extremities. Has history of a known thoracic aneurysm that has had multiple CTs and shows no sign of rapid expansion or rupture. Chronic renal insufficiency. Previous stroke.  HPI  Past Medical History:  Diagnosis Date  . Diabetes mellitus without complication ([place])   . Hypertension   . Renal disorder    pt stated that masses were found on his kidneys  . Renal insufficiency   . Stroke [facility])     Patient Active Problem List   Diagnosis Date Noted  . Thoracic aortic aneurysm without rupture ([place])   . Chest pain   . Hypertensive urgency   . Slow transit constipation 11/25/2015  . Right sided weakness 10/17/2015  . DM type 2, goal A1c below 7 10/17/2015  . Uncontrolled hypertension 10/17/2015  . CVA (cerebral infarction) 10/17/2015  . Sensory disturbance 04/25/2015  . Ascending aorta dilatation (HCC)   . Protein-calorie malnutrition, severe ([place]) 04/24/2015  . Heart block AV second degree 04/23/2015  . Dizziness 04/23/2015  . Diabetes mellitus type 2, controlled ([place]) 04/23/2015  . Essential hypertension 04/23/2015  . Tobacco abuse 04/23/2015  . AV block, 2nd degree   . SEBORRHEIC DERMATITIS 05/07/2010  . RECTAL BLEEDING 08/14/2009  . DIABETES MELLITUS, TYPE II, UNCONTROLLED 06/11/2009  . Hyperlipidemia 06/11/2009  . Cocaine abuse 06/11/2009  . ESSENTIAL  HYPERTENSION 06/11/2009  . DIASTOLIC DYSFUNCTION [id number]  . RENAL INSUFFICIENCY 06/11/2009    Past Surgical History:  Procedure Laterality Date  . KNEE SURGERY     bilateral       Home Medications    Prior to Admission medications   Medication Sig Start Date End Date Taking? Authorizing Provider  amLODipine (NORVASC) 10 MG tablet Take 1 tablet (10 mg total) by mouth daily. 02/16/16  Yes Harvel Quale, MD  aspirin EC 81 MG tablet Take 81 mg by mouth daily.   Yes Historical Provider, MD  atorvastatin (LIPITOR) 40 MG tablet Take 1 tablet (40 mg total) by mouth daily at 6 PM. 10/20/15  Yes Nishant Dhungel, MD  glipiZIDE (GLUCOTROL) 10 MG tablet Take 10 mg by mouth daily before breakfast.   Yes Historical Provider, MD  gabapentin (NEURONTIN) 100 MG capsule Take 1 capsule (100 mg total) by mouth 3 (three) times daily. Patient not taking: Reported on 03/15/2016 03/04/16 03/14/16  Duffy Bruce, MD    Family History Family History  Problem Relation Age of Onset  . Diabetes Mellitus II Mother   . Hypertension Mother   . Diabetes Mellitus II Sister   . Hypertension Sister     Social History Social History  Substance Use Topics  . Smoking status: Former Smoker    Packs/day: 0.25    Years: 20.00    Types: Cigarettes  . Smokeless tobacco: Never Used  . Alcohol use  No     Allergies   Clonidine derivatives and Lisinopril   Review of Systems Review of Systems  Constitutional: Positive for fatigue. Negative for appetite change, chills, diaphoresis and fever.  HENT: Negative for mouth sores, sore throat and trouble swallowing.   Eyes: Negative for visual disturbance.  Respiratory: Positive for shortness of breath. Negative for cough, chest tightness and wheezing.   Cardiovascular: Positive for chest pain.  Gastrointestinal: Negative for abdominal distention, abdominal pain, diarrhea, nausea and vomiting.  Endocrine: Negative for polydipsia, polyphagia and polyuria.    Genitourinary: Negative for dysuria, frequency and hematuria.  Musculoskeletal: Negative for gait problem.  Skin: Negative for color change, pallor and rash.  Neurological: Positive for numbness. Negative for dizziness, syncope, light-headedness and headaches.  Hematological: Does not bruise/bleed easily.  Psychiatric/Behavioral: Negative for behavioral problems and confusion.     Physical Exam Updated Vital Signs BP 158/97   Pulse 62   Temp 97.7 F (36.5 C) (Oral)   Resp 11   SpO2 99%   Physical Exam  Constitutional: He is oriented to person, place, and time. He appears well-developed and well-nourished. No distress.  Extremely tall, 6 foot 9 inch male. No acute distress. I've had difficulty having him focus on our conversation as he is changing channels on the television and asking that the lights be turned off.  HENT:  Head: Normocephalic.  Eyes: Conjunctivae are normal. Pupils are equal, round, and reactive to light. No scleral icterus.  Neck: Normal range of motion. Neck supple. No thyromegaly present.  Cardiovascular: Normal rate and regular rhythm.  Exam reveals no gallop and no friction rub.   No murmur heard. No murmurs rubs or gallops. Clear lungs. No extremity edema.  Pulmonary/Chest: Effort normal and breath sounds normal. No respiratory distress. He has no wheezes. He has no rales.  Abdominal: Soft. Bowel sounds are normal. He exhibits no distension. There is no tenderness. There is no rebound.  Musculoskeletal: Normal range of motion.  Neurological: He is alert and oriented to person, place, and time.  Skin: Skin is warm and dry. No rash noted.  Psychiatric: He has a normal mood and affect. His behavior is normal.     ED Treatments / Results  Labs (all labs ordered are listed, but only abnormal results are displayed) Labs Reviewed  CBC - Abnormal; Notable for the following:       Result Value   WBC 2.6 (*)    Platelets 147 (*)    All other components  within normal limits  I-STAT TROPOININ, ED - Abnormal; Notable for the following:    Troponin i, poc 0.19 (*)    All other components within normal limits  CBG MONITORING, ED - Abnormal; Notable for the following:    Glucose-Capillary 119 (*)    All other components within normal limits  BASIC METABOLIC PANEL  TROPONIN I  TROPONIN I  CBG MONITORING, ED    EKG  EKG Interpretation  Date/Time:  Tuesday March 15 2016 10:33:05 EDT Ventricular Rate:  60 PR Interval:  184 QRS Duration: 96 QT Interval:  424 QTC Calculation: 424 R Axis:   42 Text Interpretation:  Normal sinus rhythm Cannot rule out Anterior infarct , age undetermined Abnormal ECG Confirmed by Jeneen Rinks  MD, [place] ([id number]) on 03/15/2016 11:35:55 AM Also confirmed by Lacinda Axon  MD, BRIAN ([id number])  on 03/15/2016 4:55:27 PM       Radiology Dg Chest 2 View  Result Date: 03/15/2016 CLINICAL DATA:  55 year old male with a  history of chest pain and shortness of breath EXAM: CHEST  2 VIEW COMPARISON:  03/04/2016, 02/26/2016 FINDINGS: Cardiomediastinal silhouette unchanged in size and contour. No confluent airspace disease, pneumothorax, or pleural effusion. No displaced fracture. IMPRESSION: No active cardiopulmonary disease. Signed, Dulcy Fanny. Earleen Newport, DO [facility] Radiology Electronically Signed   By: Corrie Mckusick D.O.   On: 03/15/2016 11:37    Procedures Procedures (including critical care time)  Medications Ordered in ED Medications  morphine 4 MG/ML injection 4 mg (4 mg Intravenous Not Given 03/15/16 1654)  nitroGLYCERIN 50 mg in dextrose 5 % 250 mL (0.2 mg/mL) infusion (0 mcg/min Intravenous Stopped 03/15/16 1257)  aspirin chewable tablet 324 mg (324 mg Oral Given 03/15/16 1215)  ondansetron (ZOFRAN) injection 4 mg (4 mg Intravenous Given 03/15/16 1216)  HYDROmorphone (DILAUDID) injection 1 mg (1 mg Intravenous Given 03/15/16 1304)  hydrALAZINE (APRESOLINE) injection 10 mg (10 mg Intravenous  Given 03/15/16 1305)  hydrALAZINE (APRESOLINE) injection 20 mg (20 mg Intravenous Given 03/15/16 1416)  amLODipine (NORVASC) tablet 10 mg (10 mg Oral Given 03/15/16 1432)  ondansetron (ZOFRAN) injection 4 mg (4 mg Intravenous Given 03/15/16 1542)     Initial Impression / Assessment and Plan / ED Course  I have reviewed the triage vital signs and the nursing notes.  Pertinent labs & imaging results that were available during my care of the patient were reviewed by me and considered in my medical decision making (see chart for details).  Clinical Course    On recheck patient had been started on nitroglycerin. His blood pressure improves. His pain is unchanged. Formal lab testing her troponin is normal. In discussing the patient's pain with him he has difficulty focusing on his chest pain stating that he's been constipated and is really hungry right now. When he is directed back to his chest pain he states it's "either an 8 or a 10".  I have asked to discontinue his nitroglycerin given some pain medication and planning delta troponin at this point.  Final Clinical Impressions(s) / ED Diagnoses   Final diagnoses:  Chest pain, unspecified chest pain type  Essential hypertension    He is given his amlodipine. He complained of some nausea. However he states this was "gone instantly". He states the return of any wanted to get around. Second troponin is normal. Heel is appropriate for outpatient follow-up with his primary care physician. I did have our case manager meet with him to see if he had any additional home needs and he has declined.  New Prescriptions New Prescriptions   No medications on file     Tanna Furry, MD 03/15/16 [phone]

## 2016-03-15 NOTE — Discharge Instructions (Signed)
Make an appointment with, and see a primary care physician.  Continue your current medications.

## 2016-03-15 NOTE — ED Notes (Signed)
Pt c/o nausea, numbness in both hands and feet, and chest tightness. Dr. Jeneen Rinks at bedside.

## 2016-03-21 ENCOUNTER — Ambulatory Visit: Payer: Self-pay | Admitting: Cardiovascular Disease

## 2016-03-29 ENCOUNTER — Encounter (HOSPITAL_COMMUNITY): Payer: Self-pay | Admitting: Emergency Medicine

## 2016-03-29 ENCOUNTER — Emergency Department (HOSPITAL_COMMUNITY): Payer: Self-pay

## 2016-03-29 ENCOUNTER — Emergency Department (HOSPITAL_COMMUNITY)
Admission: EM | Admit: 2016-03-29 | Discharge: 2016-03-29 | Disposition: A | Discharge status: Home / Self Care | Payer: Self-pay | Attending: Emergency Medicine | Admitting: Emergency Medicine

## 2016-03-29 DIAGNOSIS — Z7984 Long term (current) use of oral hypoglycemic drugs: Secondary | ICD-10-CM | POA: Insufficient documentation

## 2016-03-29 DIAGNOSIS — Z87891 Personal history of nicotine dependence: Secondary | ICD-10-CM | POA: Insufficient documentation

## 2016-03-29 DIAGNOSIS — Z7982 Long term (current) use of aspirin: Secondary | ICD-10-CM | POA: Insufficient documentation

## 2016-03-29 DIAGNOSIS — Z79899 Other long term (current) drug therapy: Secondary | ICD-10-CM | POA: Insufficient documentation

## 2016-03-29 DIAGNOSIS — E119 Type 2 diabetes mellitus without complications: Secondary | ICD-10-CM | POA: Insufficient documentation

## 2016-03-29 DIAGNOSIS — Z8673 Personal history of transient ischemic attack (TIA), and cerebral infarction without residual deficits: Secondary | ICD-10-CM | POA: Insufficient documentation

## 2016-03-29 DIAGNOSIS — E860 Dehydration: Secondary | ICD-10-CM | POA: Insufficient documentation

## 2016-03-29 DIAGNOSIS — R0602 Shortness of breath: Secondary | ICD-10-CM | POA: Insufficient documentation

## 2016-03-29 DIAGNOSIS — R51 Headache: Secondary | ICD-10-CM | POA: Insufficient documentation

## 2016-03-29 DIAGNOSIS — I10 Essential (primary) hypertension: Secondary | ICD-10-CM | POA: Insufficient documentation

## 2016-03-29 LAB — RAPID URINE DRUG SCREEN, HOSP PERFORMED
Amphetamines: NOT DETECTED
BARBITURATES: NOT DETECTED
Benzodiazepines: NOT DETECTED
COCAINE: NOT DETECTED
Opiates: NOT DETECTED
Tetrahydrocannabinol: NOT DETECTED

## 2016-03-29 LAB — DIFFERENTIAL
Basophils Absolute: 0 10*3/uL (ref 0.0–0.1)
Basophils Relative: 0 %
EOS ABS: 0 10*3/uL (ref 0.0–0.7)
EOS PCT: 0 %
LYMPHS ABS: 1.1 10*3/uL (ref 0.7–4.0)
Lymphocytes Relative: 40 %
MONOS PCT: 7 %
Monocytes Absolute: 0.2 10*3/uL (ref 0.1–1.0)
Neutro Abs: 1.4 10*3/uL — ABNORMAL LOW (ref 1.7–7.7)
Neutrophils Relative %: 53 %

## 2016-03-29 LAB — URINALYSIS, ROUTINE W REFLEX MICROSCOPIC
GLUCOSE, UA: NEGATIVE mg/dL
Hgb urine dipstick: NEGATIVE
Ketones, ur: 40 mg/dL — AB
LEUKOCYTES UA: NEGATIVE
NITRITE: NEGATIVE
PROTEIN: NEGATIVE mg/dL
Specific Gravity, Urine: 1.027 (ref 1.005–1.030)
pH: 6 (ref 5.0–8.0)

## 2016-03-29 LAB — CBC
HEMATOCRIT: 47.3 % (ref 39.0–52.0)
HEMOGLOBIN: 15.6 g/dL (ref 13.0–17.0)
MCH: 29.8 pg (ref 26.0–34.0)
MCHC: 33 g/dL (ref 30.0–36.0)
MCV: 90.4 fL (ref 78.0–100.0)
Platelets: 134 10*3/uL — ABNORMAL LOW (ref 150–400)
RBC: 5.23 MIL/uL (ref 4.22–5.81)
RDW: 12.6 % (ref 11.5–15.5)
WBC: 2.7 10*3/uL — ABNORMAL LOW (ref 4.0–10.5)

## 2016-03-29 LAB — COMPREHENSIVE METABOLIC PANEL
ALT: 15 U/L — ABNORMAL LOW (ref 17–63)
ANION GAP: 10 (ref 5–15)
AST: 18 U/L (ref 15–41)
Albumin: 4 g/dL (ref 3.5–5.0)
Alkaline Phosphatase: 68 U/L (ref 38–126)
BILIRUBIN TOTAL: 1.2 mg/dL (ref 0.3–1.2)
BUN: 16 mg/dL (ref 6–20)
CO2: 24 mmol/L (ref 22–32)
Calcium: 9.1 mg/dL (ref 8.9–10.3)
Chloride: 103 mmol/L (ref 101–111)
Creatinine, Ser: 1.34 mg/dL — ABNORMAL HIGH (ref 0.61–1.24)
GFR calc Af Amer: >60 mL/min (ref >60)
GFR, EST NON AFRICAN AMERICAN: 59 mL/min — AB (ref >60)
Glucose, Bld: 92 mg/dL (ref 65–99)
POTASSIUM: 4 mmol/L (ref 3.5–5.1)
Sodium: 137 mmol/L (ref 135–145)
TOTAL PROTEIN: 6.7 g/dL (ref 6.5–8.1)

## 2016-03-29 LAB — I-STAT TROPONIN, ED: TROPONIN I, POC: 0 ng/mL (ref 0.00–0.08)

## 2016-03-29 LAB — ETHANOL: Alcohol, Ethyl (B): <5 mg/dL (ref <5)

## 2016-03-29 LAB — PROTIME-INR
INR: 1.09
Prothrombin Time: 14.1 seconds (ref 11.4–15.2)

## 2016-03-29 LAB — CBG MONITORING, ED: GLUCOSE-CAPILLARY: 87 mg/dL (ref 65–99)

## 2016-03-29 LAB — APTT: aPTT: 36 seconds (ref 24–36)

## 2016-03-29 MED ORDER — AMLODIPINE BESYLATE 10 MG PO TABS: 10.0000 mg | ORAL_TABLET | Freq: Every day | ORAL | 2 refills | Status: DC

## 2016-03-29 MED ORDER — GLIPIZIDE 10 MG PO TABS
10.0000 mg | ORAL_TABLET | Freq: Every day | ORAL | 2 refills | Status: DC
Start: 2016-03-29 — End: 2017-12-14

## 2016-03-29 MED ORDER — SODIUM CHLORIDE 0.9 % IV BOLUS (SEPSIS)
1000.0000 mL | Freq: Once | INTRAVENOUS | Status: AC
  Administered 2016-03-29: 1000 mL via INTRAVENOUS

## 2016-03-29 MED ORDER — GABAPENTIN 100 MG PO CAPS: 100.0000 mg | ORAL_CAPSULE | Freq: Three times a day (TID) | ORAL | 2 refills | Status: DC

## 2016-03-29 MED ORDER — AMLODIPINE BESYLATE 5 MG PO TABS
10.0000 mg | ORAL_TABLET | Freq: Once | ORAL | Status: AC
  Administered 2016-03-29: 10 mg via ORAL
  Filled 2016-03-29: qty 2

## 2016-03-29 MED ORDER — ATORVASTATIN CALCIUM 40 MG PO TABS
40.0000 mg | ORAL_TABLET | Freq: Every day | ORAL | 2 refills | Status: DC
Start: 2016-03-29 — End: 2017-10-30

## 2016-03-29 MED ORDER — SODIUM CHLORIDE 0.9 % IV BOLUS (SEPSIS)
1000.0000 mL | Freq: Once | INTRAVENOUS | Status: AC
Start: 2016-03-29 — End: 2016-03-29
  Administered 2016-03-29: 1000 mL via INTRAVENOUS

## 2016-03-29 MED ORDER — ASPIRIN EC 81 MG PO TBEC: 81.0000 mg | DELAYED_RELEASE_TABLET | Freq: Every day | ORAL | 2 refills | Status: DC

## 2016-03-29 NOTE — ED Notes (Signed)
Pt's BP 170/108.  Informed Jenny Reichmann, RN.

## 2016-03-29 NOTE — ED Notes (Signed)
Pt went to [facility] and locked his door and we could not get in, after talking thru door pt states he is fine , then he asked for  Wash clothes but would not come out and then locked  Door again. He did open door for security

## 2016-03-29 NOTE — ED Notes (Signed)
Pt states that he has been weak for a few days has been out of bp meds, states gait is bad, pt normally walks with walker

## 2016-03-29 NOTE — ED Triage Notes (Signed)
Pt is poor historian and sts generalized weakness and dehydration starting last night

## 2016-03-29 NOTE — ED Notes (Signed)
Pt to xray given 2 apple juices and  Sprite before going . Pt states he feels his sugar is low

## 2016-03-29 NOTE — ED Provider Notes (Signed)
[place] DEPT Provider Note   CSN: [id number] Arrival date & time: 03/29/16  [id number]     History   Chief Complaint Chief Complaint  Patient presents with  . Weakness    HPI Javier Rivera is a 55 y.o. male.  Patient initially very elusive with chief complaint. He had difficulty concentrating on our conversation and took several minutes to answer simple yes or no questions. He continued to ask for something to eat saying he was dehydrated. After redirecting and further questioning, patient eventually endorsed worsening R sided weakness. He reports a history of stroke 3 months ago with residual R sided weakness. He says over the past few days he feels that his weakness has worsened and he has had difficulty ambulating. Says his right foot has been dragging. He has peripheral neuropathy from DM but denies any new numbness or tingling. No HA, visions changes, slurred speech, or facial droop. He does endorse wheezing and SOB. He is currently homeless and living in a hotel. Also reports he has run out of all of his home BP and DM medications. Says he last took his medications yesterday.       Past Medical History:  Diagnosis Date  . Diabetes mellitus without complication ([place])   . Hypertension   . Renal disorder    pt stated that masses were found on his kidneys  . Renal insufficiency   . Stroke [facility])     Patient Active Problem List   Diagnosis Date Noted  . Thoracic aortic aneurysm without rupture ([place])   . Chest pain   . Hypertensive urgency   . Slow transit constipation 11/25/2015  . Right sided weakness 10/17/2015  . DM type 2, goal A1c below 7 10/17/2015  . Uncontrolled hypertension 10/17/2015  . CVA (cerebral infarction) 10/17/2015  . Sensory disturbance 04/25/2015  . Ascending aorta dilatation (HCC)   . Protein-calorie malnutrition, severe ([place]) 04/24/2015  . Heart block AV second degree 04/23/2015  . Dizziness 04/23/2015  . Diabetes mellitus type 2, controlled  ([place]) 04/23/2015  . Essential hypertension 04/23/2015  . Tobacco abuse 04/23/2015  . AV block, 2nd degree   . SEBORRHEIC DERMATITIS 05/07/2010  . RECTAL BLEEDING 08/14/2009  . DIABETES MELLITUS, TYPE II, UNCONTROLLED 06/11/2009  . Hyperlipidemia 06/11/2009  . Cocaine abuse 06/11/2009  . ESSENTIAL HYPERTENSION 06/11/2009  . DIASTOLIC DYSFUNCTION [id number]  . RENAL INSUFFICIENCY 06/11/2009    Past Surgical History:  Procedure Laterality Date  . KNEE SURGERY     bilateral       Home Medications    Prior to Admission medications   Medication Sig Start Date End Date Taking? Authorizing Provider  amLODipine (NORVASC) 10 MG tablet Take 1 tablet (10 mg total) by mouth daily. 03/29/16   Velna Ochs, MD  aspirin EC 81 MG tablet Take 1 tablet (81 mg total) by mouth daily. 03/29/16   Velna Ochs, MD  atorvastatin (LIPITOR) 40 MG tablet Take 1 tablet (40 mg total) by mouth daily at 6 PM. 03/29/16   Velna Ochs, MD  gabapentin (NEURONTIN) 100 MG capsule Take 1 capsule (100 mg total) by mouth 3 (three) times daily. 03/29/16   Velna Ochs, MD  glipiZIDE (GLUCOTROL) 10 MG tablet Take 1 tablet (10 mg total) by mouth daily before breakfast. 03/29/16   Velna Ochs, MD    Family History Family History  Problem Relation Age of Onset  . Diabetes Mellitus II Mother   . Hypertension Mother   . Diabetes Mellitus II Sister   .  Hypertension Sister     Social History Social History  Substance Use Topics  . Smoking status: Former Smoker    Packs/day: 0.25    Years: 20.00    Types: Cigarettes  . Smokeless tobacco: Never Used  . Alcohol use No     Allergies   Clonidine derivatives and Lisinopril   Review of Systems Review of Systems  All other systems reviewed and are negative.    Physical Exam Updated Vital Signs BP (!) 170/108 (BP Location: Right Arm)   Pulse 76   Temp 97.5 F (36.4 C) (Oral)   Resp 18   Ht 6\' 5"  (1.956 m)   Wt 90.7 kg   SpO2 98%    BMI 23.72 kg/m   Physical Exam  Constitutional: He is oriented to person, place, and time. He appears well-developed and well-nourished.  Extremely tall, 6 foot 9 inch male. No acute distress.   HENT:  Head: Normocephalic and atraumatic.  Eyes: Conjunctivae are normal.  Neck: Neck supple.  Cardiovascular: Normal rate and regular rhythm.   No murmur heard. Pulmonary/Chest: Effort normal and breath sounds normal. No respiratory distress. He has no wheezes.  Abdominal: Soft. Bowel sounds are normal. There is no tenderness.  Musculoskeletal: He exhibits no edema.  Neurological: He is alert and oriented to person, place, and time. No cranial nerve deficit.  Strength 5/5 on L upper and L extremity, 4/5 on R upper and lower extremity. Residual R sided facial droop from prior CVA. No new focal neurological deficits. Wide based gait.  Skin: Skin is warm and dry.  Psychiatric:  Difficulty concentrating on conversation, takes several minutes to answer questions.   Nursing note and vitals reviewed.    ED Treatments / Results  Labs (all labs ordered are listed, but only abnormal results are displayed) Labs Reviewed  CBC - Abnormal; Notable for the following:       Result Value   WBC 2.7 (*)    Platelets 134 (*)    All other components within normal limits  DIFFERENTIAL - Abnormal; Notable for the following:    Neutro Abs 1.4 (*)    All other components within normal limits  COMPREHENSIVE METABOLIC PANEL - Abnormal; Notable for the following:    Creatinine, Ser 1.34 (*)    ALT 15 (*)    GFR calc non Af Amer 59 (*)    All other components within normal limits  URINALYSIS, ROUTINE W REFLEX MICROSCOPIC (NOT AT [facility]) - Abnormal; Notable for the following:    Color, Urine AMBER (*)    Bilirubin Urine MODERATE (*)    Ketones, ur 40 (*)    All other components within normal limits  ETHANOL  PROTIME-INR  APTT  URINE RAPID DRUG SCREEN, HOSP PERFORMED  CBG MONITORING, ED  I-STAT TROPOININ,  ED    EKG  EKG Interpretation None       Radiology Dg Chest 2 View  Result Date: 03/29/2016 CLINICAL DATA:  Cough today EXAM: CHEST  2 VIEW COMPARISON:  03/15/2016 FINDINGS: Heart is normal size. Mild tortuosity of the thoracic aorta. Lungs are clear. No effusions. No acute bony abnormality. IMPRESSION: No active cardiopulmonary disease. Electronically Signed   By: Rolm Baptise M.D.   On: 03/29/2016 10:44   Ct Head Wo Contrast  Result Date: 03/29/2016 CLINICAL DATA:  Generalized weakness, headache. EXAM: CT HEAD WITHOUT CONTRAST TECHNIQUE: Contiguous axial images were obtained from the base of the skull through the vertex without intravenous contrast. COMPARISON:  03/04/2016 FINDINGS:  Brain: Mild chronic small vessel disease changes throughout the deep white matter. No acute intracranial abnormality. Specifically, no hemorrhage, hydrocephalus, mass lesion, acute infarction, or significant intracranial injury. Vascular: No hyperdense vessel or unexpected calcification. Skull: No acute calvarial abnormality. Sinuses/Orbits: Visualized paranasal sinuses and mastoids clear. Orbital soft tissues unremarkable. Other: None IMPRESSION: Chronic small vessel disease changes throughout the deep white matter. No acute intracranial abnormality. Electronically Signed   By: Rolm Baptise M.D.   On: 03/29/2016 11:33    Procedures Procedures (including critical care time)  Medications Ordered in ED Medications  sodium chloride 0.9 % bolus 1,000 mL (1,000 mLs Intravenous New Bag/Given 03/29/16 1022)  amLODipine (NORVASC) tablet 10 mg (10 mg Oral Given 03/29/16 1344)  sodium chloride 0.9 % bolus 1,000 mL (1,000 mLs Intravenous New Bag/Given 03/29/16 1200)     Initial Impression / Assessment and Plan / ED Course  I have reviewed the triage vital signs and the nursing notes.  Pertinent labs & imaging results that were available during my care of the patient were reviewed by me and considered in my medical  decision making (see chart for details).  Clinical Course   Weakness: Worsening R sided weakness from prior CVA. CT head today with no acute abnormalities. CXR negative. CBC and BMP unremarkable. Troponin 0.00 and EKG with no ischemic changes. UA with moderate bilirubin and ketones, likely due to dehydration. Given 1L NS bolus x 2 in ED.   Medication noncompliance: Patient out of all home meds. Patient is homeless and [facility]. Given printed Rx for all meds with refills  x 2. F/u with resource center.   Final Clinical Impressions(s) / ED Diagnoses   Final diagnoses:  Dehydration    New Prescriptions Current Discharge Medication List       Velna Ochs, MD 03/29/16 1452    Merrily Pew, MD 03/30/16 [phone]

## 2016-03-29 NOTE — ED Provider Notes (Signed)
I saw and evaluated the patient, reviewed the resident's note and I agree with the findings and plan.  55 yo M w/ weakness. Possibly stroke reactivation vs dehydration. Will eval for both, likely discharge if all ok.    EKG Interpretation  Date/Time:  Tuesday March 29 2016 10:55:32 EDT Ventricular Rate:  68 PR Interval:    QRS Duration: 95 QT Interval:  399 QTC Calculation: 425 R Axis:   39 Text Interpretation:  Sinus or ectopic atrial rhythm Prolonged PR interval similar to previous on March 15, 2016 Confirmed by [facility] MD, Mckay Tegtmeyer [phone]) on 03/29/2016 4:03:26 PM         Merrily Pew, MD 03/29/16 [phone]

## 2016-03-29 NOTE — Discharge Instructions (Signed)
Please continue to take all of your home medicines as prescribed. Follow up with your primary care doctor for further management. Thank you!

## 2016-03-31 ENCOUNTER — Encounter (HOSPITAL_COMMUNITY): Payer: Self-pay | Admitting: Emergency Medicine

## 2016-03-31 ENCOUNTER — Emergency Department (HOSPITAL_COMMUNITY)
Admission: EM | Admit: 2016-03-31 | Discharge: 2016-03-31 | Disposition: A | Discharge status: Home / Self Care | Payer: Self-pay | Attending: Emergency Medicine | Admitting: Emergency Medicine

## 2016-03-31 DIAGNOSIS — E119 Type 2 diabetes mellitus without complications: Secondary | ICD-10-CM | POA: Insufficient documentation

## 2016-03-31 DIAGNOSIS — Z79899 Other long term (current) drug therapy: Secondary | ICD-10-CM | POA: Insufficient documentation

## 2016-03-31 DIAGNOSIS — Z8673 Personal history of transient ischemic attack (TIA), and cerebral infarction without residual deficits: Secondary | ICD-10-CM | POA: Insufficient documentation

## 2016-03-31 DIAGNOSIS — I10 Essential (primary) hypertension: Secondary | ICD-10-CM | POA: Insufficient documentation

## 2016-03-31 DIAGNOSIS — M549 Dorsalgia, unspecified: Secondary | ICD-10-CM | POA: Insufficient documentation

## 2016-03-31 DIAGNOSIS — Z7982 Long term (current) use of aspirin: Secondary | ICD-10-CM | POA: Insufficient documentation

## 2016-03-31 DIAGNOSIS — Z7984 Long term (current) use of oral hypoglycemic drugs: Secondary | ICD-10-CM | POA: Insufficient documentation

## 2016-03-31 DIAGNOSIS — R1084 Generalized abdominal pain: Secondary | ICD-10-CM | POA: Insufficient documentation

## 2016-03-31 DIAGNOSIS — G8929 Other chronic pain: Secondary | ICD-10-CM | POA: Insufficient documentation

## 2016-03-31 LAB — COMPREHENSIVE METABOLIC PANEL WITH GFR
ALT: 17 U/L (ref 17–63)
AST: 17 U/L (ref 15–41)
Albumin: 4 g/dL (ref 3.5–5.0)
Alkaline Phosphatase: 63 U/L (ref 38–126)
Anion gap: 6 (ref 5–15)
BUN: 8 mg/dL (ref 6–20)
CO2: 27 mmol/L (ref 22–32)
Calcium: 9.1 mg/dL (ref 8.9–10.3)
Chloride: 105 mmol/L (ref 101–111)
Creatinine, Ser: 1.08 mg/dL (ref 0.61–1.24)
GFR calc Af Amer: >60 mL/min
GFR calc non Af Amer: >60 mL/min
Glucose, Bld: 107 mg/dL — ABNORMAL HIGH (ref 65–99)
Potassium: 4.1 mmol/L (ref 3.5–5.1)
Sodium: 138 mmol/L (ref 135–145)
Total Bilirubin: 1 mg/dL (ref 0.3–1.2)
Total Protein: 6.8 g/dL (ref 6.5–8.1)

## 2016-03-31 LAB — URINALYSIS, ROUTINE W REFLEX MICROSCOPIC
Bilirubin Urine: NEGATIVE
Glucose, UA: NEGATIVE mg/dL
Hgb urine dipstick: NEGATIVE
Ketones, ur: NEGATIVE mg/dL
Leukocytes, UA: NEGATIVE
Nitrite: NEGATIVE
Protein, ur: NEGATIVE mg/dL
Specific Gravity, Urine: 1.024 (ref 1.005–1.030)
pH: 6 (ref 5.0–8.0)

## 2016-03-31 LAB — CBC
HCT: 46.9 % (ref 39.0–52.0)
Hemoglobin: 15.7 g/dL (ref 13.0–17.0)
MCH: 29.7 pg (ref 26.0–34.0)
MCHC: 33.5 g/dL (ref 30.0–36.0)
MCV: 88.8 fL (ref 78.0–100.0)
PLATELETS: 140 10*3/uL — AB (ref 150–400)
RBC: 5.28 MIL/uL (ref 4.22–5.81)
RDW: 12.7 % (ref 11.5–15.5)
WBC: 2.7 10*3/uL — AB (ref 4.0–10.5)

## 2016-03-31 LAB — LIPASE, BLOOD: Lipase: 20 U/L (ref 11–51)

## 2016-03-31 MED ORDER — ACETAMINOPHEN 500 MG PO TABS
500.0000 mg | ORAL_TABLET | Freq: Once | ORAL | Status: AC
  Administered 2016-03-31: 500 mg via ORAL
  Filled 2016-03-31: qty 1

## 2016-03-31 MED ORDER — ONDANSETRON 4 MG PO TBDP
8.0000 mg | ORAL_TABLET | Freq: Once | ORAL | Status: AC
  Administered 2016-03-31: 8 mg via ORAL
  Filled 2016-03-31: qty 2

## 2016-03-31 MED ORDER — ONDANSETRON HCL 4 MG PO TABS: 4.0000 mg | ORAL_TABLET | Freq: Three times a day (TID) | ORAL | 0 refills | Status: DC | PRN

## 2016-03-31 MED ORDER — SALINE SPRAY 0.65 % NA SOLN
1.0000 | Freq: Once | NASAL | Status: DC
  Filled 2016-03-31: qty 44

## 2016-03-31 NOTE — ED Provider Notes (Signed)
[facility] DEPT Provider Note   CSN: [id number] Arrival date & time: 03/31/16  1036  History   Chief Complaint Chief Complaint  Patient presents with  . Back Pain  . Abdominal Pain    HPI Javier Rivera is a 55 y.o. male.  HPI  Patient presenting with lower back and abdominal pain.   Chronic low back pain Patient reports chronic bilateral lower back pain for as long as he can remember. Has not taken anything to try to help with pain. Denies numbness other than chronic numbness 2/2 diabetic neuropathy. Denies weakness, bowel or bladder incontinence, saddle anesthesia. Says that pain is unchanged from his last ED visit two days ago, but he does not think his back was examined then so he wanted to come back in.  Abdominal pain Bilateral lower quadrant abdominal pain beginning a few days ago. Pain is both sharp and throbbing. Sometimes followed by bowel movement. Also endorses nausea but no vomiting. Denies diarrhea. Last BM yesterday. Not alleviated or worsened by eating. Has not taken anything for pain.   Of note, this is patient's 13th ED visit in past six months. He was most recently seen with similar complaints two days ago, and was determined stable for discharge home. Patient is homeless.   Past Medical History:  Diagnosis Date  . Diabetes mellitus without complication ([place])   . Hypertension   . Renal disorder    pt stated that masses were found on his kidneys  . Renal insufficiency   . Stroke [facility])     Patient Active Problem List   Diagnosis Date Noted  . Thoracic aortic aneurysm without rupture ([place])   . Chest pain   . Hypertensive urgency   . Slow transit constipation 11/25/2015  . Right sided weakness 10/17/2015  . DM type 2, goal A1c below 7 10/17/2015  . Uncontrolled hypertension 10/17/2015  . CVA (cerebral infarction) 10/17/2015  . Sensory disturbance 04/25/2015  . Ascending aorta dilatation (HCC)   . Protein-calorie malnutrition, severe ([place])  04/24/2015  . Heart block AV second degree 04/23/2015  . Dizziness 04/23/2015  . Diabetes mellitus type 2, controlled ([place]) 04/23/2015  . Essential hypertension 04/23/2015  . Tobacco abuse 04/23/2015  . AV block, 2nd degree   . SEBORRHEIC DERMATITIS 05/07/2010  . RECTAL BLEEDING 08/14/2009  . DIABETES MELLITUS, TYPE II, UNCONTROLLED 06/11/2009  . Hyperlipidemia 06/11/2009  . Cocaine abuse 06/11/2009  . ESSENTIAL HYPERTENSION 06/11/2009  . DIASTOLIC DYSFUNCTION [id number]  . RENAL INSUFFICIENCY 06/11/2009    Past Surgical History:  Procedure Laterality Date  . KNEE SURGERY     bilateral     Home Medications    Prior to Admission medications   Medication Sig Start Date End Date Taking? Authorizing Provider  amLODipine (NORVASC) 10 MG tablet Take 1 tablet (10 mg total) by mouth daily. 03/29/16   Velna Ochs, MD  aspirin EC 81 MG tablet Take 1 tablet (81 mg total) by mouth daily. 03/29/16   Velna Ochs, MD  atorvastatin (LIPITOR) 40 MG tablet Take 1 tablet (40 mg total) by mouth daily at 6 PM. 03/29/16   Velna Ochs, MD  gabapentin (NEURONTIN) 100 MG capsule Take 1 capsule (100 mg total) by mouth 3 (three) times daily. 03/29/16   Velna Ochs, MD  glipiZIDE (GLUCOTROL) 10 MG tablet Take 1 tablet (10 mg total) by mouth daily before breakfast. 03/29/16   Velna Ochs, MD  ondansetron (ZOFRAN) 4 MG tablet Take 1 tablet (4 mg total) by mouth every 8 (eight) hours  as needed for nausea or vomiting. 03/31/16   Verner Mould, MD    Family History Family History  Problem Relation Age of Onset  . Diabetes Mellitus II Mother   . Hypertension Mother   . Diabetes Mellitus II Sister   . Hypertension Sister     Social History Social History  Substance Use Topics  . Smoking status: Former Smoker    Packs/day: 0.25    Years: 20.00    Types: Cigarettes  . Smokeless tobacco: Never Used  . Alcohol use No     Allergies   Clonidine derivatives and  Lisinopril   Review of Systems Review of Systems  Constitutional: Negative for appetite change and fever.  Respiratory: Negative for shortness of breath.   Cardiovascular: Negative for chest pain.  Gastrointestinal: Positive for abdominal pain and nausea. Negative for constipation, diarrhea and vomiting.  Genitourinary: Negative for difficulty urinating, dysuria and hematuria.  Musculoskeletal: Positive for back pain. Negative for gait problem.  Allergic/Immunologic: Negative for immunocompromised state.  Neurological: Negative for dizziness and light-headedness.   Physical Exam Updated Vital Signs BP (!) 174/118   Pulse (!) 54   Temp 97.4 F (36.3 C) (Oral)   Resp 18   Ht 6\' 9"  (2.057 m)   Wt 84.5 kg   SpO2 97%   BMI 19.96 kg/m   Physical Exam  Constitutional: He is oriented to person, place, and time.  Disheveled male lying in bed in NAD  HENT:  Head: Normocephalic and atraumatic.  Nose: Nose normal.  Mouth/Throat: Oropharynx is clear and moist. No oropharyngeal exudate.  Eyes: Conjunctivae and EOM are normal. Pupils are equal, round, and reactive to light. Right eye exhibits no discharge. Left eye exhibits no discharge.  Cardiovascular: Normal rate, regular rhythm and normal heart sounds.   No murmur heard. Pulmonary/Chest: Effort normal and breath sounds normal. No respiratory distress. He has no wheezes.  Abdominal: Soft. Bowel sounds are normal. He exhibits no distension and no mass. There is tenderness (Diffuse). There is no rebound and no guarding.  Musculoskeletal: Normal range of motion. He exhibits no edema or tenderness.  No TTP of back at midline or elsewhere.   Neurological: He is alert and oriented to person, place, and time.  Skin: Skin is warm and dry.  Psychiatric: His behavior is normal.  Flat affect   ED Treatments / Results  Labs (all labs ordered are listed, but only abnormal results are displayed) Labs Reviewed  COMPREHENSIVE METABOLIC PANEL -  Abnormal; Notable for the following:       Result Value   Glucose, Bld 107 (*)    All other components within normal limits  CBC - Abnormal; Notable for the following:    WBC 2.7 (*)    Platelets 140 (*)    All other components within normal limits  URINALYSIS, ROUTINE W REFLEX MICROSCOPIC (NOT AT [facility]) - Abnormal; Notable for the following:    Color, Urine AMBER (*)    All other components within normal limits  LIPASE, BLOOD    EKG  EKG Interpretation None       Radiology No results found.  Procedures Procedures (including critical care time)  Medications Ordered in ED Medications  sodium chloride (OCEAN) 0.65 % nasal spray 1 spray (not administered)  ondansetron (ZOFRAN-ODT) disintegrating tablet 8 mg (8 mg Oral Given 03/31/16 1353)  acetaminophen (TYLENOL) tablet 500 mg (500 mg Oral Given 03/31/16 1355)   Initial Impression / Assessment and Plan / ED Course  I  have reviewed the triage vital signs and the nursing notes.  Pertinent labs & imaging results that were available during my care of the patient were reviewed by me and considered in my medical decision making (see chart for details).  Clinical Course   Final Clinical Impressions(s) / ED Diagnoses   Final diagnoses:  Generalized abdominal pain  Chronic back pain   Patient presenting with chronic lower back pain and abdominal pain. Some reported tenderness on my abdominal exam, though no grimacing or guarding. Abdominal exam benign when performed by Dr. Adela Glimpse. No TTP of back, and no neurological deficits or red flags. No lab abnormalities, and UA without signs of infection. Discharged with Zofran for nausea. Patient with difficult social situation. Is homeless, but also very non compliant. Has been offered numerous resources to help establish care with a primary physician and obtain his medications, however is not willing to follow through with any of these opportunities. As such, his chronic medical conditions  continue to worsen. Stressed importance of establishing care and managing his chronic conditions, however patient seems disinterested.   New Prescriptions New Prescriptions   ONDANSETRON (ZOFRAN) 4 MG TABLET    Take 1 tablet (4 mg total) by mouth every 8 (eight) hours as needed for nausea or vomiting.     Verner Mould, MD 03/31/16 1431    Merrily Pew, MD 03/31/16 [phone]

## 2016-03-31 NOTE — ED Notes (Signed)
Pt states he understands instructions  

## 2016-03-31 NOTE — ED Notes (Signed)
MD at bedside. 

## 2016-03-31 NOTE — ED Notes (Signed)
MD aware of pt has not received his nasal spray.

## 2016-03-31 NOTE — Discharge Instructions (Signed)
You can take ibuprofen or Tylenol for your back pain every 4-6 hours as needed.   For nausea, you can take Zofran up to every 8 hours as needed.   It is very important to establish care with a primary doctor so you can continue to receive your medications for blood pressure and diabetes. A primary doctor can also help with management of your chronic back pain and any other acute problems you may have.

## 2016-03-31 NOTE — ED Triage Notes (Signed)
Pt ehre with lower back pain and lower abdominal pain since last night. Pt deneis n/v/d/ fever. Pt a/o x 4, ambulatory to traige.

## 2016-05-16 ENCOUNTER — Encounter (HOSPITAL_COMMUNITY): Payer: Self-pay | Admitting: *Deleted

## 2016-05-16 ENCOUNTER — Emergency Department (HOSPITAL_COMMUNITY)
Admission: EM | Admit: 2016-05-16 | Discharge: 2016-05-16 | Disposition: A | Discharge status: Home / Self Care | Payer: Self-pay | Attending: Physician Assistant | Admitting: Physician Assistant

## 2016-05-16 DIAGNOSIS — Z79899 Other long term (current) drug therapy: Secondary | ICD-10-CM | POA: Insufficient documentation

## 2016-05-16 DIAGNOSIS — Z7982 Long term (current) use of aspirin: Secondary | ICD-10-CM | POA: Insufficient documentation

## 2016-05-16 DIAGNOSIS — Z7984 Long term (current) use of oral hypoglycemic drugs: Secondary | ICD-10-CM | POA: Insufficient documentation

## 2016-05-16 DIAGNOSIS — Z87891 Personal history of nicotine dependence: Secondary | ICD-10-CM | POA: Insufficient documentation

## 2016-05-16 DIAGNOSIS — R202 Paresthesia of skin: Secondary | ICD-10-CM | POA: Insufficient documentation

## 2016-05-16 DIAGNOSIS — I10 Essential (primary) hypertension: Secondary | ICD-10-CM | POA: Insufficient documentation

## 2016-05-16 DIAGNOSIS — Z8673 Personal history of transient ischemic attack (TIA), and cerebral infarction without residual deficits: Secondary | ICD-10-CM | POA: Insufficient documentation

## 2016-05-16 DIAGNOSIS — E119 Type 2 diabetes mellitus without complications: Secondary | ICD-10-CM | POA: Insufficient documentation

## 2016-05-16 LAB — CBC
HCT: 48.7 % (ref 39.0–52.0)
Hemoglobin: 15.4 g/dL (ref 13.0–17.0)
MCH: 29.2 pg (ref 26.0–34.0)
MCHC: 31.6 g/dL (ref 30.0–36.0)
MCV: 92.4 fL (ref 78.0–100.0)
PLATELETS: 175 10*3/uL (ref 150–400)
RBC: 5.27 MIL/uL (ref 4.22–5.81)
RDW: 13.9 % (ref 11.5–15.5)
WBC: 4.2 10*3/uL (ref 4.0–10.5)

## 2016-05-16 LAB — BASIC METABOLIC PANEL
Anion gap: 8 (ref 5–15)
BUN: 7 mg/dL (ref 6–20)
CHLORIDE: 104 mmol/L (ref 101–111)
CO2: 30 mmol/L (ref 22–32)
CREATININE: 0.95 mg/dL (ref 0.61–1.24)
Calcium: 9.3 mg/dL (ref 8.9–10.3)
GFR calc Af Amer: >60 mL/min (ref >60)
GFR calc non Af Amer: >60 mL/min (ref >60)
GLUCOSE: 167 mg/dL — AB (ref 65–99)
Potassium: 3.3 mmol/L — ABNORMAL LOW (ref 3.5–5.1)
SODIUM: 142 mmol/L (ref 135–145)

## 2016-05-16 NOTE — ED Triage Notes (Signed)
PT states numbness to both pinky and ring finger of both hands since last night.  Most of the numbness has resolved.  Pt just feels numbness to fingertips presently.

## 2016-05-16 NOTE — Discharge Instructions (Signed)
Please follow up with a primary [facility] (info provided) and a neurologist for peripheral neuropathy.   To find a primary care or specialty doctor please call [phone] or [phone] to access "[facility] a Doctor Service."  You may also go on the [facility] website at [email or web address]  There are also multiple [facility],  and [facility] practices throughout the [place] that are frequently accepting new patients. You may find a clinic that is close to your home and contact them.  [facility] -  [address] [place] [phone]  [facility] in [place] (also locations in [place] and [place]) -  [place] [address]  [place] [address] [phone]

## 2016-05-16 NOTE — ED Provider Notes (Signed)
[facility] DEPT Provider Note   CSN: [id number] Arrival date & time: 05/16/16  1010     History   Chief Complaint Chief Complaint  Patient presents with  . Numbness    HPI Javier Rivera is a 55 y.o. male.  HPI   55 yo with history of stroke, DM nad renal disorder presenting here with unclear symtpoms.  Patienthas had numbness in his hands on and off for years.  He thinks it is his neuropathy.  No weakness. No new symtpoms. Patietn is an impossible hisotrian.   Patient is malodorous and reportedly has no home.   Past Medical History:  Diagnosis Date  . Diabetes mellitus without complication ([place])   . Hypertension   . Renal disorder    pt stated that masses were found on his kidneys  . Renal insufficiency   . Stroke [facility])     Patient Active Problem List   Diagnosis Date Noted  . Thoracic aortic aneurysm without rupture ([place])   . Chest pain   . Hypertensive urgency   . Slow transit constipation 11/25/2015  . Right sided weakness 10/17/2015  . DM type 2, goal A1c below 7 10/17/2015  . Uncontrolled hypertension 10/17/2015  . CVA (cerebral infarction) 10/17/2015  . Sensory disturbance 04/25/2015  . Ascending aorta dilatation (HCC)   . Protein-calorie malnutrition, severe ([place]) 04/24/2015  . Heart block AV second degree 04/23/2015  . Dizziness 04/23/2015  . Diabetes mellitus type 2, controlled ([place]) 04/23/2015  . Essential hypertension 04/23/2015  . Tobacco abuse 04/23/2015  . AV block, 2nd degree   . SEBORRHEIC DERMATITIS 05/07/2010  . RECTAL BLEEDING 08/14/2009  . DIABETES MELLITUS, TYPE II, UNCONTROLLED 06/11/2009  . Hyperlipidemia 06/11/2009  . Cocaine abuse 06/11/2009  . ESSENTIAL HYPERTENSION 06/11/2009  . DIASTOLIC DYSFUNCTION [id number]  . RENAL INSUFFICIENCY 06/11/2009    Past Surgical History:  Procedure Laterality Date  . KNEE SURGERY     bilateral       Home Medications    Prior to Admission medications   Medication Sig Start Date  End Date Taking? Authorizing Provider  amLODipine (NORVASC) 10 MG tablet Take 1 tablet (10 mg total) by mouth daily. 03/29/16  Yes Velna Ochs, MD  aspirin EC 81 MG tablet Take 1 tablet (81 mg total) by mouth daily. 03/29/16  Yes Velna Ochs, MD  atorvastatin (LIPITOR) 40 MG tablet Take 1 tablet (40 mg total) by mouth daily at 6 PM. 03/29/16  Yes Velna Ochs, MD  gabapentin (NEURONTIN) 100 MG capsule Take 1 capsule (100 mg total) by mouth 3 (three) times daily. 03/29/16  Yes Velna Ochs, MD  glipiZIDE (GLUCOTROL) 10 MG tablet Take 1 tablet (10 mg total) by mouth daily before breakfast. 03/29/16  Yes Velna Ochs, MD  hydrochlorothiazide (HYDRODIURIL) 25 MG tablet Take 25 mg by mouth daily.   Yes Historical Provider, MD  ondansetron (ZOFRAN) 4 MG tablet Take 1 tablet (4 mg total) by mouth every 8 (eight) hours as needed for nausea or vomiting. Patient not taking: Reported on 05/16/2016 03/31/16   Verner Mould, MD    Family History Family History  Problem Relation Age of Onset  . Diabetes Mellitus II Mother   . Hypertension Mother   . Diabetes Mellitus II Sister   . Hypertension Sister     Social History Social History  Substance Use Topics  . Smoking status: Former Smoker    Packs/day: 0.25    Years: 20.00    Types: Cigarettes  . Smokeless tobacco: Never  Used  . Alcohol use No     Allergies   Clonidine derivatives and Lisinopril   Review of Systems Review of Systems  Unable to perform ROS: Other  HENT: Positive for trouble swallowing.   Cardiovascular: Negative for chest pain.  Neurological: Positive for numbness.  All other systems reviewed and are negative.    Physical Exam Updated Vital Signs BP (!) 149/107 (BP Location: Right Arm)   Pulse 79   Temp 98.4 F (36.9 C) (Oral)   Resp 18   Ht 6\' 5"  (1.956 m)   Wt 191 lb (86.6 kg)   SpO2 100%   BMI 22.65 kg/m   Physical Exam  Constitutional: He is oriented to person, place, and  time. He appears well-nourished.  Unkempt, maladorous  HENT:  Head: Normocephalic.  Eyes: Conjunctivae are normal.  Cardiovascular: Normal rate.   Pulmonary/Chest: Effort normal.  Neurological: He is oriented to person, place, and time.  Good strenght in all four extremities, bialterla UE intact with movememtn and strength.   Skin: Skin is warm and dry. He is not diaphoretic.  Psychiatric: He has a normal mood and affect. His behavior is normal.     ED Treatments / Results  Labs (all labs ordered are listed, but only abnormal results are displayed) Labs Reviewed  BASIC METABOLIC PANEL - Abnormal; Notable for the following:       Result Value   Potassium 3.3 (*)    Glucose, Bld 167 (*)    All other components within normal limits  CBC    EKG  EKG Interpretation None       Radiology No results found.  Procedures Procedures (including critical care time)  Medications Ordered in ED Medications - No data to display   Initial Impression / Assessment and Plan / ED Course  I have reviewed the triage vital signs and the nursing notes.  Pertinent labs & imaging results that were available during my care of the patient were reviewed by me and considered in my medical decision making (see chart for details).  Clinical Course    Pt here with 55 year old male chronic non complaince presenting with numbness in hands, chronic. Patient on arrival smelled malodorous, and dirty clothing that were wet, unkempt. We offered patient a shower, fresh meal. I don't think there is anything to be done about his chronic numbness (neuropathy) Pt has normal labs and vital signs and physical exam.. We'll discharge him follow-up with his primary care physician.  Final Clinical Impressions(s) / ED Diagnoses   Final diagnoses:  None    New Prescriptions New Prescriptions   No medications on file     Khy Pitre Julio Alm, MD 05/16/16 1642

## 2016-05-16 NOTE — ED Notes (Signed)
Gave Pt [place] sandwich with soda and juice.

## 2016-05-31 ENCOUNTER — Telehealth (HOSPITAL_BASED_OUTPATIENT_CLINIC_OR_DEPARTMENT_OTHER): Payer: Self-pay | Admitting: Emergency Medicine

## 2016-05-31 NOTE — Telephone Encounter (Signed)
Lost to followup 

## 2016-10-27 ENCOUNTER — Inpatient Hospital Stay (HOSPITAL_COMMUNITY)
Admission: EM | Admit: 2016-10-27 | Discharge: 2016-10-29 | DRG: 640 | Disposition: A | Discharge status: Skilled Nursing Facility | Payer: Self-pay | Attending: Internal Medicine | Admitting: Internal Medicine

## 2016-10-27 ENCOUNTER — Encounter (HOSPITAL_COMMUNITY): Payer: Self-pay | Admitting: Emergency Medicine

## 2016-10-27 ENCOUNTER — Emergency Department (HOSPITAL_COMMUNITY): Payer: Self-pay

## 2016-10-27 DIAGNOSIS — Z6821 Body mass index (BMI) 21.0-21.9, adult: Secondary | ICD-10-CM

## 2016-10-27 DIAGNOSIS — E785 Hyperlipidemia, unspecified: Secondary | ICD-10-CM | POA: Diagnosis present

## 2016-10-27 DIAGNOSIS — Z720 Tobacco use: Secondary | ICD-10-CM | POA: Diagnosis present

## 2016-10-27 DIAGNOSIS — E43 Unspecified severe protein-calorie malnutrition: Secondary | ICD-10-CM | POA: Diagnosis present

## 2016-10-27 DIAGNOSIS — W1830XA Fall on same level, unspecified, initial encounter: Secondary | ICD-10-CM | POA: Diagnosis present

## 2016-10-27 DIAGNOSIS — Z9181 History of falling: Secondary | ICD-10-CM

## 2016-10-27 DIAGNOSIS — I10 Essential (primary) hypertension: Secondary | ICD-10-CM | POA: Diagnosis present

## 2016-10-27 DIAGNOSIS — Z8673 Personal history of transient ischemic attack (TIA), and cerebral infarction without residual deficits: Secondary | ICD-10-CM

## 2016-10-27 DIAGNOSIS — Z888 Allergy status to other drugs, medicaments and biological substances status: Secondary | ICD-10-CM

## 2016-10-27 DIAGNOSIS — I712 Thoracic aortic aneurysm, without rupture, unspecified: Secondary | ICD-10-CM | POA: Diagnosis present

## 2016-10-27 DIAGNOSIS — F17210 Nicotine dependence, cigarettes, uncomplicated: Secondary | ICD-10-CM | POA: Diagnosis present

## 2016-10-27 DIAGNOSIS — E860 Dehydration: Secondary | ICD-10-CM | POA: Diagnosis present

## 2016-10-27 DIAGNOSIS — I77810 Thoracic aortic ectasia: Secondary | ICD-10-CM | POA: Diagnosis present

## 2016-10-27 DIAGNOSIS — R531 Weakness: Secondary | ICD-10-CM

## 2016-10-27 DIAGNOSIS — R627 Adult failure to thrive: Principal | ICD-10-CM | POA: Diagnosis present

## 2016-10-27 DIAGNOSIS — M25551 Pain in right hip: Secondary | ICD-10-CM | POA: Diagnosis present

## 2016-10-27 DIAGNOSIS — D1803 Hemangioma of intra-abdominal structures: Secondary | ICD-10-CM | POA: Diagnosis present

## 2016-10-27 DIAGNOSIS — E876 Hypokalemia: Secondary | ICD-10-CM | POA: Diagnosis present

## 2016-10-27 DIAGNOSIS — E538 Deficiency of other specified B group vitamins: Secondary | ICD-10-CM | POA: Diagnosis present

## 2016-10-27 DIAGNOSIS — I639 Cerebral infarction, unspecified: Secondary | ICD-10-CM | POA: Diagnosis present

## 2016-10-27 DIAGNOSIS — W19XXXA Unspecified fall, initial encounter: Secondary | ICD-10-CM | POA: Diagnosis present

## 2016-10-27 DIAGNOSIS — I951 Orthostatic hypotension: Secondary | ICD-10-CM

## 2016-10-27 DIAGNOSIS — Z590 Homelessness: Secondary | ICD-10-CM

## 2016-10-27 DIAGNOSIS — K5900 Constipation, unspecified: Secondary | ICD-10-CM | POA: Diagnosis present

## 2016-10-27 DIAGNOSIS — E119 Type 2 diabetes mellitus without complications: Secondary | ICD-10-CM

## 2016-10-27 DIAGNOSIS — R079 Chest pain, unspecified: Secondary | ICD-10-CM | POA: Diagnosis present

## 2016-10-27 LAB — URINALYSIS, ROUTINE W REFLEX MICROSCOPIC
BILIRUBIN URINE: NEGATIVE
Glucose, UA: 50 mg/dL — AB
HGB URINE DIPSTICK: NEGATIVE
Ketones, ur: NEGATIVE mg/dL
LEUKOCYTES UA: NEGATIVE
Nitrite: NEGATIVE
PROTEIN: 30 mg/dL — AB
Specific Gravity, Urine: 1.021 (ref 1.005–1.030)
pH: 6 (ref 5.0–8.0)

## 2016-10-27 LAB — CBC WITH DIFFERENTIAL/PLATELET
Basophils Absolute: 0 10*3/uL (ref 0.0–0.1)
Basophils Relative: 0 %
EOS ABS: 0 10*3/uL (ref 0.0–0.7)
Eosinophils Relative: 1 %
HEMATOCRIT: 48.3 % (ref 39.0–52.0)
HEMOGLOBIN: 16.6 g/dL (ref 13.0–17.0)
LYMPHS ABS: 1.3 10*3/uL (ref 0.7–4.0)
LYMPHS PCT: 36 %
MCH: 30 pg (ref 26.0–34.0)
MCHC: 34.4 g/dL (ref 30.0–36.0)
MCV: 87.3 fL (ref 78.0–100.0)
MONOS PCT: 8 %
Monocytes Absolute: 0.3 10*3/uL (ref 0.1–1.0)
NEUTROS ABS: 2 10*3/uL (ref 1.7–7.7)
NEUTROS PCT: 55 %
Platelets: 211 10*3/uL (ref 150–400)
RBC: 5.53 MIL/uL (ref 4.22–5.81)
RDW: 12.2 % (ref 11.5–15.5)
WBC: 3.7 10*3/uL — AB (ref 4.0–10.5)

## 2016-10-27 LAB — BASIC METABOLIC PANEL
Anion gap: 9 (ref 5–15)
BUN: 13 mg/dL (ref 6–20)
CHLORIDE: 102 mmol/L (ref 101–111)
CO2: 26 mmol/L (ref 22–32)
Calcium: 9 mg/dL (ref 8.9–10.3)
Creatinine, Ser: 0.95 mg/dL (ref 0.61–1.24)
GFR calc Af Amer: >60 mL/min (ref >60)
GFR calc non Af Amer: >60 mL/min (ref >60)
Glucose, Bld: 116 mg/dL — ABNORMAL HIGH (ref 65–99)
POTASSIUM: 3.5 mmol/L (ref 3.5–5.1)
SODIUM: 137 mmol/L (ref 135–145)

## 2016-10-27 LAB — I-STAT TROPONIN, ED: TROPONIN I, POC: 0 ng/mL (ref 0.00–0.08)

## 2016-10-27 LAB — GLUCOSE, CAPILLARY: Glucose-Capillary: 180 mg/dL — ABNORMAL HIGH (ref 65–99)

## 2016-10-27 MED ORDER — AMLODIPINE BESYLATE 10 MG PO TABS
10.0000 mg | ORAL_TABLET | Freq: Every day | ORAL | Status: DC
  Administered 2016-10-28 – 2016-10-29 (×2): 10 mg via ORAL
  Filled 2016-10-27 (×3): qty 1

## 2016-10-27 MED ORDER — GABAPENTIN 100 MG PO CAPS
100.0000 mg | ORAL_CAPSULE | Freq: Three times a day (TID) | ORAL | Status: DC
  Administered 2016-10-27 – 2016-10-29 (×5): 100 mg via ORAL
  Filled 2016-10-27 (×5): qty 1

## 2016-10-27 MED ORDER — ACETAMINOPHEN 650 MG RE SUPP: 650.0000 mg | Freq: Four times a day (QID) | RECTAL | Status: DC | PRN

## 2016-10-27 MED ORDER — SODIUM CHLORIDE 0.9 % IV SOLN
INTRAVENOUS | Status: DC
  Administered 2016-10-27 – 2016-10-29 (×2): via INTRAVENOUS

## 2016-10-27 MED ORDER — ATORVASTATIN CALCIUM 40 MG PO TABS
40.0000 mg | ORAL_TABLET | Freq: Every day | ORAL | Status: DC
  Administered 2016-10-28: 40 mg via ORAL
  Filled 2016-10-27: qty 1

## 2016-10-27 MED ORDER — SODIUM CHLORIDE 0.9 % IV BOLUS (SEPSIS)
1000.0000 mL | Freq: Once | INTRAVENOUS | Status: AC
  Administered 2016-10-27: 1000 mL via INTRAVENOUS

## 2016-10-27 MED ORDER — ENOXAPARIN SODIUM 40 MG/0.4ML ~~LOC~~ SOLN
40.0000 mg | SUBCUTANEOUS | Status: DC
  Administered 2016-10-28: 40 mg via SUBCUTANEOUS
  Filled 2016-10-27 (×2): qty 0.4

## 2016-10-27 MED ORDER — INSULIN ASPART 100 UNIT/ML ~~LOC~~ SOLN
0.0000 [IU] | Freq: Three times a day (TID) | SUBCUTANEOUS | Status: DC
  Administered 2016-10-28: 1 [IU] via SUBCUTANEOUS

## 2016-10-27 MED ORDER — ASPIRIN EC 81 MG PO TBEC
81.0000 mg | DELAYED_RELEASE_TABLET | Freq: Every day | ORAL | Status: DC
  Administered 2016-10-28 – 2016-10-29 (×2): 81 mg via ORAL
  Filled 2016-10-27 (×3): qty 1

## 2016-10-27 MED ORDER — SERTRALINE HCL 20 MG/ML PO CONC
25.0000 mg | Freq: Every day | ORAL | Status: DC
  Administered 2016-10-28 – 2016-10-29 (×2): 25 mg via ORAL
  Filled 2016-10-27 (×2): qty 1.25

## 2016-10-27 MED ORDER — ONDANSETRON HCL 4 MG PO TABS: 4.0000 mg | ORAL_TABLET | Freq: Four times a day (QID) | ORAL | Status: DC | PRN

## 2016-10-27 MED ORDER — OXYCODONE-ACETAMINOPHEN 5-325 MG PO TABS
1.0000 | ORAL_TABLET | ORAL | Status: DC | PRN
Start: 2016-10-27 — End: 2016-10-29
  Administered 2016-10-28 (×5): 1 via ORAL
  Filled 2016-10-27 (×5): qty 1

## 2016-10-27 MED ORDER — SODIUM CHLORIDE 0.9% FLUSH
3.0000 mL | Freq: Two times a day (BID) | INTRAVENOUS | Status: DC
  Administered 2016-10-27 – 2016-10-29 (×4): 3 mL via INTRAVENOUS

## 2016-10-27 MED ORDER — ONDANSETRON HCL 4 MG/2ML IJ SOLN: 4.0000 mg | Freq: Four times a day (QID) | INTRAMUSCULAR | Status: DC | PRN

## 2016-10-27 MED ORDER — SODIUM CHLORIDE 0.9 % IV BOLUS (SEPSIS)
1000.0000 mL | Freq: Once | INTRAVENOUS | Status: AC
Start: 2016-10-27 — End: 2016-10-27
  Administered 2016-10-27: 1000 mL via INTRAVENOUS

## 2016-10-27 MED ORDER — IOPAMIDOL (ISOVUE-370) INJECTION 76%
INTRAVENOUS | Status: AC
  Administered 2016-10-27: 100 mL
  Filled 2016-10-27: qty 100

## 2016-10-27 MED ORDER — IBUPROFEN 400 MG PO TABS
600.0000 mg | ORAL_TABLET | Freq: Once | ORAL | Status: AC
  Administered 2016-10-27: 15:00:00 600 mg via ORAL
  Filled 2016-10-27: qty 1

## 2016-10-27 MED ORDER — ENSURE ENLIVE PO LIQD
237.0000 mL | Freq: Two times a day (BID) | ORAL | Status: DC
  Administered 2016-10-28 – 2016-10-29 (×4): 237 mL via ORAL
  Filled 2016-10-27: qty 237

## 2016-10-27 MED ORDER — INSULIN ASPART 100 UNIT/ML ~~LOC~~ SOLN: 0.0000 [IU] | Freq: Every day | SUBCUTANEOUS | Status: DC

## 2016-10-27 MED ORDER — HYDRALAZINE HCL 20 MG/ML IJ SOLN
5.0000 mg | INTRAMUSCULAR | Status: DC | PRN
  Administered 2016-10-28: 5 mg via INTRAVENOUS
  Filled 2016-10-27: qty 1

## 2016-10-27 MED ORDER — POLYETHYLENE GLYCOL 3350 17 G PO PACK: 17.0000 g | PACK | Freq: Every day | ORAL | Status: DC | PRN

## 2016-10-27 MED ORDER — SENNOSIDES-DOCUSATE SODIUM 8.6-50 MG PO TABS
1.0000 | ORAL_TABLET | Freq: Two times a day (BID) | ORAL | Status: DC
  Administered 2016-10-27 – 2016-10-29 (×4): 1 via ORAL
  Filled 2016-10-27 (×4): qty 1

## 2016-10-27 MED ORDER — ACETAMINOPHEN 325 MG PO TABS: 650.0000 mg | ORAL_TABLET | Freq: Four times a day (QID) | ORAL | Status: DC | PRN

## 2016-10-27 MED ORDER — ZOLPIDEM TARTRATE 5 MG PO TABS
5.0000 mg | ORAL_TABLET | Freq: Every evening | ORAL | Status: DC | PRN
  Administered 2016-10-27: 5 mg via ORAL
  Filled 2016-10-27: qty 1

## 2016-10-27 MED ORDER — NICOTINE 21 MG/24HR TD PT24
21.0000 mg | MEDICATED_PATCH | Freq: Every day | TRANSDERMAL | Status: DC
  Filled 2016-10-27 (×3): qty 1

## 2016-10-27 NOTE — ED Provider Notes (Addendum)
Assumed care from Dr. Ralene Bathe at 4 PM. Briefly, the patient is a 56 y.o. male with PMHx of  has a past medical history of Diabetes mellitus without complication ([place]); Hypertension; Renal disorder; Renal insufficiency; and Stroke ([place]). here with dizziness, generalized weakness. Labs overall reassuring, however he is markedly orthostatic and unable to ambulate. IVF running. Also c/o CP and has h/o AAA, so CT scan pending.   Labs Reviewed  CBC WITH DIFFERENTIAL/PLATELET - Abnormal; Notable for the following:       Result Value   WBC 3.7 (*)    All other components within normal limits  BASIC METABOLIC PANEL - Abnormal; Notable for the following:    Glucose, Bld 116 (*)    All other components within normal limits  HEPATIC FUNCTION PANEL  URINALYSIS, ROUTINE W REFLEX MICROSCOPIC  I-STAT TROPOININ, ED    Course of Care: -CT scan shows no acute abnormality. He does have both thoracic and abdominal aortic aneurysms which will need to be monitored. Patient remains markedly orthostatic with increase of heart rate to the 150s with position changes. He is also hypertensive at baseline, which is calm dictating treatment of hypertension as I'm hesitant to worsen his orthostasis. Patient has been given a total of 3 L of fluid and remains unable to ambulate. Will subsequently admit.  Clinical Impression: 1. Orthostatic hypotension   3. Dehydration       Duffy Bruce, MD 10/28/16 1626    Duffy Bruce, MD 10/28/16 [phone]

## 2016-10-27 NOTE — ED Notes (Signed)
Patient transported to CT 

## 2016-10-27 NOTE — ED Notes (Signed)
Pt is homeless, wearing layers of shirts, old stocking hat, shoes and fleece pants.  Clothing is showing layers of soiling.  Pt is able to communicate very well but appears unwilling to answer questions regarding current health concerns.  Continues to tell me "it's a long story.  We won't go into that now"  Immediately took hold of remote control and turned on sports channel without instructions on use.

## 2016-10-27 NOTE — H&P (Signed)
History and Physical    Javier Rivera HUD:[id number] DOB: August 13, 1960 DOA: 10/27/2016  Referring MD/NP/PA:   PCP: No PCP Per Patient   Patient coming from:  The patient is homeless.  At baseline, pt is independent for most of ADL.   Chief Complaint: Weakness, fall and right hip pain   HPI: Javier Rivera is a 56 y.o. male with medical history significant of homeless, hypertension, hyperlipidemia, stroke, ascending thoracic aortic aneurysm, tobacco abuse, cocaine abuse, who presents with weakness, fall, right hip pain.  Pt is able to communicate very well, but appears unwilling to answer questions in detail, the history is limited. Pt states that has weakness and has been experiencing difficulty walking for more than one year. When asked for more detail information regarding his weakness, he states that he has numbness in both arms, and weakness in left leg and right arm. Patient does not have facial droop, hearing loss or vision loss. He states that he cannot walk normally due to weakness. Patient complains sulfa right hip pain. He states that he does not have cough or shortness rest, but has a sinus congestion. No runny nose or sore throat. He said occasionally he has some mild chest pain, but no chest pain currently. Patient does not have nausea, vomiting, abdominal pain or diarrhea. He states that he has been constipated recently. No symptoms of UTI. Per report, he is homeless and was found sleeping on cement under a bridge. Patient states that he did not take any medications for long time.  ED Course: pt was found to have positive orthostatic vital signs, negative troponin, WBC 3.7, urinalysis negative, electrolytes renal function okay, temperature normal, no tachycardia, oxygen saturation 96% on room air. Blood pressure is elevated 183/135. Pt is placed on tele bed for obs.  # CT-head: negative for acute intracranial abnormalities #: X-ray of the right hip/pelvis: Negative for acute bony  abnormalities # CT angiogram of the chest/abdomen/pelvis: 1. No CTA evidence for acute aortic dissection or other acute vascular abnormality. 2. Aneurysmal dilatation of the ascending aorta up to 4.7 cm. 3. Mild aneurysmal dilatation of the infrarenal aorta to 3.0 cm. 4. Moderate atheromatous plaque throughout the intra-abdominal aorta and bilateral iliac arteries as above, with diffuse 3 vessel coronary artery calcifications. 5. No other acute abnormality within the chest, abdomen, and pelvis. 6. Large amount of retained stool within the colon, suggesting constipation. 7. 4.5 cm benign hepatic hemangioma.  Review of Systems:   General: no fevers, chills, no changes in body weight, has poor appetite, has fatigue HEENT: no blurry vision, hearing changes or sore throat Respiratory: no dyspnea, coughing, wheezing CV: no chest pain, no palpitations GI: no nausea, vomiting, abdominal pain, diarrhea, has constipation GU: no dysuria, burning on urination, increased urinary frequency, hematuria  Ext: no leg edema Neuro: has weakness, numbness in extremities, no vision change or hearing loss Skin: no rash, no skin tear. MSK: No muscle spasm, no deformity, no limitation of range of movement in spin Heme: No easy bruising.  Travel history: No recent long distant travel.  Allergy:  Allergies  Allergen Reactions  . Clonidine Derivatives     Pt reported the sweats and shakes  . Lisinopril Palpitations    "made me real sick, made my heart rate scatter"    Past Medical History:  Diagnosis Date  . Diabetes mellitus without complication ([place])   . Hypertension   . Renal disorder    pt stated that masses were found on his kidneys  .  Renal insufficiency   . Stroke [facility])     Past Surgical History:  Procedure Laterality Date  . KNEE SURGERY     bilateral    Social History:  reports that he has been smoking Cigarettes.  He has a 5.00 pack-year smoking history. He has never used  smokeless tobacco. He reports that he does not drink alcohol or use drugs.  Family History:  Family History  Problem Relation Age of Onset  . Diabetes Mellitus II Mother   . Hypertension Mother   . Diabetes Mellitus II Sister   . Hypertension Sister      Prior to Admission medications   Medication Sig Start Date End Date Taking? Authorizing Provider  amLODipine (NORVASC) 10 MG tablet Take 1 tablet (10 mg total) by mouth daily. 03/29/16   Velna Ochs, MD  aspirin EC 81 MG tablet Take 1 tablet (81 mg total) by mouth daily. 03/29/16   Velna Ochs, MD  atorvastatin (LIPITOR) 40 MG tablet Take 1 tablet (40 mg total) by mouth daily at 6 PM. 03/29/16   Velna Ochs, MD  gabapentin (NEURONTIN) 100 MG capsule Take 1 capsule (100 mg total) by mouth 3 (three) times daily. 03/29/16   Velna Ochs, MD  glipiZIDE (GLUCOTROL) 10 MG tablet Take 1 tablet (10 mg total) by mouth daily before breakfast. 03/29/16   Velna Ochs, MD  hydrochlorothiazide (HYDRODIURIL) 25 MG tablet Take 25 mg by mouth daily.    Historical Provider, MD  ondansetron (ZOFRAN) 4 MG tablet Take 1 tablet (4 mg total) by mouth every 8 (eight) hours as needed for nausea or vomiting. Patient not taking: Reported on 05/16/2016 03/31/16   Verner Mould, MD    Physical Exam: Vitals:   10/27/16 2020 10/27/16 2022 10/27/16 2100 10/27/16 2200  BP: (!) 181/135 (!) 166/124 (!) 173/121 (!) 190/107  Pulse:   84   Resp: 20 18 13 18   Temp:      TempSrc:      SpO2:   100%   Weight:      Height:       General: Not in acute distress. Poor hygiene, thin. HEENT:       Eyes: PERRL, EOMI, no scleral icterus.       ENT: No discharge from the ears and nose, no pharynx injection, no tonsillar enlargement.        Neck: No JVD, no bruit, no mass felt. Heme: No neck lymph node enlargement. Cardiac: S1/S2, RRR, No murmurs, No gallops or rubs. Respiratory: No rales, wheezing, rhonchi or rubs. GI: Soft, nondistended,  nontender, no rebound pain, no organomegaly, BS present. GU: No hematuria Ext: No pitting leg edema bilaterally. 2+DP/PT pulse bilaterally. Musculoskeletal: No joint deformities, No joint redness or warmth, no limitation of ROM in spin. Skin: No rashes.  Neuro: Alert, oriented X3, cranial nerves II-XII grossly intact. Pt has poor effort when exam ing his muscle strength, he seems to have mild weakness in right arm and leg. sensation to light touch intact. Brachial reflex 2+ bilaterally. Negative Babinski's sign. Normal finger to nose test. Psych: In depressed mood. Patient is not psychotic, no suicidal or hemocidal ideation.  Labs on Admission: I have personally reviewed following labs and imaging studies  CBC:  Recent Labs Lab 10/27/16 1410  WBC 3.7*  NEUTROABS 2.0  HGB 16.6  HCT 48.3  MCV 87.3  PLT 412   Basic Metabolic Panel:  Recent Labs Lab 10/27/16 1410  NA 137  K 3.5  CL 102  CO2 26  GLUCOSE 116*  BUN 13  CREATININE 0.95  CALCIUM 9.0   GFR: Estimated Creatinine Clearance: 101.4 mL/min (by C-G formula based on SCr of 0.95 mg/dL). Liver Function Tests: No results for input(s): AST, ALT, ALKPHOS, BILITOT, PROT, ALBUMIN in the last 168 hours. No results for input(s): LIPASE, AMYLASE in the last 168 hours. No results for input(s): AMMONIA in the last 168 hours. Coagulation Profile: No results for input(s): INR, PROTIME in the last 168 hours. Cardiac Enzymes: No results for input(s): CKTOTAL, CKMB, CKMBINDEX, TROPONINI in the last 168 hours. BNP (last 3 results) No results for input(s): PROBNP in the last 8760 hours. HbA1C: No results for input(s): HGBA1C in the last 72 hours. CBG: No results for input(s): GLUCAP in the last 168 hours. Lipid Profile: No results for input(s): CHOL, HDL, LDLCALC, TRIG, CHOLHDL, LDLDIRECT in the last 72 hours. Thyroid Function Tests: No results for input(s): TSH, T4TOTAL, FREET4, T3FREE, THYROIDAB in the last 72 hours. Anemia  Panel: No results for input(s): VITAMINB12, FOLATE, FERRITIN, TIBC, IRON, RETICCTPCT in the last 72 hours. Urine analysis:    Component Value Date/Time   COLORURINE YELLOW 10/27/2016 1540   APPEARANCEUR CLEAR 10/27/2016 1540   LABSPEC 1.021 10/27/2016 1540   PHURINE 6.0 10/27/2016 1540   GLUCOSEU 50 (A) 10/27/2016 1540   HGBUR NEGATIVE 10/27/2016 1540   BILIRUBINUR NEGATIVE 10/27/2016 1540   KETONESUR NEGATIVE 10/27/2016 1540   PROTEINUR 30 (A) 10/27/2016 1540   UROBILINOGEN 2.0 (H) 04/23/2015 0843   NITRITE NEGATIVE 10/27/2016 1540   LEUKOCYTESUR NEGATIVE 10/27/2016 1540   Sepsis Labs: @LABRCNTIP (procalcitonin:4,lacticidven:4) )No results found for this or any previous visit (from the past 240 hour(s)).   Radiological Exams on Admission: Ct Head Wo Contrast  Result Date: 10/27/2016 CLINICAL DATA:  Weakness and recent falls EXAM: CT HEAD WITHOUT CONTRAST TECHNIQUE: Contiguous axial images were obtained from the base of the skull through the vertex without intravenous contrast. COMPARISON:  03/29/2016 FINDINGS: Brain: Mild atrophic and white matter ischemic changes are noted. No findings to suggest acute hemorrhage, acute infarction or space-occupying mass lesion are noted. Vascular: No hyperdense vessel or unexpected calcification. Skull: Normal. Negative for fracture or focal lesion. Sinuses/Orbits: No acute finding. Other: None. IMPRESSION: Mild atrophic and ischemic changes without acute abnormality. Electronically Signed   By: Inez Catalina M.D.   On: 10/27/2016 14:49   Ct Angio Chest/abd/pel For Dissection W And/or Wo Contrast  Result Date: 10/27/2016 CLINICAL DATA:  Initial evaluation for acute weakness, tachycardia, or right groin/ thigh/ hip pain. EXAM: CT ANGIOGRAPHY CHEST, ABDOMEN AND PELVIS TECHNIQUE: Multidetector CT imaging through the chest, abdomen and pelvis was performed using the standard protocol during bolus administration of intravenous contrast. Multiplanar  reconstructed images and MIPs were obtained and reviewed to evaluate the vascular anatomy. CONTRAST:  100 cc of Isovue 370. COMPARISON:  Prior radiograph from 03/29/2016. FINDINGS: CTA CHEST FINDINGS Cardiovascular: Precontrast imaging through the chest demonstrate scattered atheromatous plaque within the ascending aorta and aortic arch. No mural thrombus or other complication. Postcontrast imaging demonstrates no evidence for acute aortic dissection. Ascending aorta dilated up to 4.7 cm in maximal diameter (series 5, image 54). Minimal plaque about the origin of the left subclavian artery without stenosis. Partially visualized great vessels widely patent and within normal limits. Heart size within normal limits. Scattered 3 vessel coronary artery calcifications. No pericardial effusion. Limited evaluation of the pulmonary arteries grossly unremarkable. Mediastinum/Nodes: Thyroid normal. No pathologically enlarged mediastinal, hilar, or axillary lymph nodes. Esophagus within normal  limits. Lungs/Pleura: Mild deep tendon atelectasis within the lungs. No focal infiltrates. No pulmonary edema or pleural effusion. No pneumothorax. No worrisome pulmonary nodule or mass. Musculoskeletal: No acute osseous abnormality. No worrisome lytic or blastic osseous lesions. Review of the MIP images confirms the above findings. CTA ABDOMEN AND PELVIS FINDINGS VASCULAR Aorta: No evidence for acute dissection or other abnormality within the intra-abdominal aorta. Infrarenal aorta dilated 3.0 cm. Mild to moderate calcified and noncalcified plaque throughout the aorta itself. Celiac: Celiac axis and its branch vessels widely patent. SMA: SMA widely patent. Renals: Single renal arteries present bilaterally, both widely patent. IMA: IMA patent. Inflow: Extensive atheromatous plaque about the common iliac arteries bilaterally just distal to the bifurcation. Small focal penetrating atheromatous ulcer noted at the proximal right common iliac  artery (series 5, image 158). Common iliac arteries ectatic measuring up to 14 mm on the right and 16 mm on the left. Moderate stenosis at the origin of the right internal iliac artery of approximately 50-60% (series 5, image 174). More mild to moderate narrowing at the origin of the left internal iliac artery. Extensive atheromatous plaque throughout the internal iliac vessels bilaterally. Right external iliac artery patent. Proximal femoral arteries within the thigh widely patent without abnormality. Left external iliac artery demonstrates extensive atheromatous plaque without significant stenosis. Visualized proximal vessels within the left thigh patent without high-grade stenosis or other acute abnormality. Veins: No venous abnormality. Review of the MIP images confirms the above findings. NON-VASCULAR Hepatobiliary: 4.5 cm hypodense lesion with peripheral nodular enhancement within the left hepatic lobe most compatible with a benign hemangioma. Liver otherwise unremarkable. Gallbladder within normal limits. No biliary dilatation. Pancreas: Pancreas within normal limits. Spleen: Spleen within normal limits. Adrenals/Urinary Tract: Approximate 2 cm hyperdense lesion within the left adrenal gland noted. Additional 3.4 cm hypodense right adrenal nodule. These are similar to previous, and most consistent with probable small adenomas. Kidneys equal in size with symmetric enhancement. Scattered bilateral renal cysts present, largest of which positioned at the lower pole the left kidney and measures 5.4 cm. No nephrolithiasis or hydronephrosis. No focal enhancing renal mass. No hydroureter. Bladder within normal limits. Stomach/Bowel: Stomach within normal limits. No evidence for bowel obstruction. Appendix normal. Large amount of retained stool diffusely throughout the colon, suggesting constipation. No acute inflammatory changes seen about the bowels. Lymphatic: No pathologically enlarged intra-abdominal or pelvic  lymph nodes. Reproductive: Prostate normal. Other: No free air or fluid. Musculoskeletal: No acute osseous abnormality. No worrisome lytic or blastic osseous lesions. Review of the MIP images confirms the above findings. IMPRESSION: 1. No CTA evidence for acute aortic dissection or other acute vascular abnormality. 2. Aneurysmal dilatation of the ascending aorta up to 4.7 cm. Recommend semi-annual imaging followup by CTA or MRA and referral to cardiothoracic surgery if not already obtained. This recommendation follows 2010 ACCF/AHA/AATS/ACR/ASA/SCA/SCAI/SIR/STS/SVM Guidelines for the Diagnosis and Management of Patients With Thoracic Aortic Disease. Circulation. 2010; 121: e266-e36. 3. Mild aneurysmal dilatation of the infrarenal aorta to 3.0 cm. Recommend followup by ultrasound in 3 years. This recommendation follows ACR consensus guidelines: White Paper of the ACR Incidental Findings Committee II on Vascular Findings. J Am Coll Radiol 2013; 18:841-660 4. Moderate atheromatous plaque throughout the intra-abdominal aorta and bilateral iliac arteries as above, with diffuse 3 vessel coronary artery calcifications. 5. No other acute abnormality within the chest, abdomen, and pelvis. 6. Large amount of retained stool within the colon, suggesting constipation. 7. 4.5 cm benign hepatic hemangioma. Electronically Signed   By: Marland Kitchen  Jeannine Boga M.D.   On: 10/27/2016 17:47   Dg Hip Unilat W Or Wo Pelvis 2-3 Views Right  Result Date: 10/27/2016 CLINICAL DATA:  Right hip pain.  Possible fall. EXAM: DG HIP (WITH OR WITHOUT PELVIS) 2-3V RIGHT COMPARISON:  None. FINDINGS: Minimal right femoral head and neck junction spur formation. Mild lower lumbar spine degenerative changes. No fracture or dislocation. IMPRESSION: Minimal right hip degenerative changes and mild lower lumbar spine degenerative changes. Electronically Signed   By: Claudie Revering M.D.   On: 10/27/2016 14:09     EKG: Independently reviewed.  Sinus rhythm,  QTC 461, anteroseptal infarction pattern, T-wave inversion in III/aVF and V5 to V6.  Assessment/Plan Principal Problem:   Weakness Active Problems:   Diabetes mellitus without complication (HCC)   Hyperlipidemia   Essential hypertension   Tobacco abuse   Protein-calorie malnutrition, severe (HCC)   Ascending aorta dilatation (HCC)   Cerebral infarction [facility])   Thoracic aortic aneurysm without rupture (HCC)   Chest pain   Fall   Constipation   Weakness: Patient has generalized weakness for more than a year. On physical examination he seems to have right-sided weakness. Not sure if this is sequela from previous stroke or new issue. Will need to r/o stroke. Other contributing factors include dehydration, poor nutrition, possible depression. Differential diagnosis would also include hypothyroidism. CT head negative for acute intracranial abnormalities. Patient is positive for orthostatic vital sign in ED  -will place on tele bed for obs -MRI of brain to r/o stroke. -check vitamin B12 level, TSH, CK, UDS, HIV ab -Hydration: 3 L of NS, then 100 mL/h -pt/ot -start zoloft empirically for possible depression  DM-II: Last A1c 5.7 on 10/19/15, well controled. Patient was on glipizide before, but not taking it now -SSI -Check A1c  HLD: Last LDL was 115 on 10/19/15. Not taking home Lipitor -Resumed home medications: Lipitor -Check FLP  HTN: -Amlodipine, -IV hydralazine when necessary  Tobacco abuse: -Did counseling about importance of quitting smoking -Nicotine patch  Protein-calorie malnutrition, severe:  -Ensure  Chest pain: Patient stated that he has mild chest pain sometimes, but not today. -ASA and lipitor -trop x 3  Constipation: -MiraLAX, senokot  Fall: Likely multifactorial etiology, including history of stroke, generalized weakness, orthostatic status, dehydration. CT head negative -PT/OT -f/u MRI-brain  Hx of stroke:  -ASA and lipitor  Ascending aorta  dilatation, Thoracic aortic aneurysm without rupture and AAA: CT angiogram showed aneurysmal dilatation of the ascending aorta up to 4.7 cm and mild aneurysmal dilatation of the infrarenal aorta to 3.0 cm. No CTA evidence for acute aortic dissection. -May need to give referral to cardiothoracic surgery  Right hip pain: No fracture by x-ray. -When necessary Tylenol and Percocet for pain   DVT ppx: SQ Lovenox Code Status: Full code Family Communication: None at bed side. Disposition Plan:  Anticipate discharge back to shelter Consults called: none Admission status: Obs / tele    Date of Service 10/27/2016    Ivor Costa [facility] Pager [phone]  If 7PM-7AM, please contact night-coverage [email or web address] Password [facility] 10/27/2016, 10:33 PM

## 2016-10-27 NOTE — ED Notes (Signed)
Pt ambulated in hall with 1 assist and unsteady gait. Stated weakness while ambulating unable to ambulate outside of room.

## 2016-10-27 NOTE — ED Notes (Signed)
Returned from CT--No change in status. 

## 2016-10-27 NOTE — ED Provider Notes (Signed)
[facility] DEPT Provider Note   CSN: [id number] Arrival date & time: 10/27/16  1256     History   Chief Complaint Chief Complaint  Patient presents with  . Weakness    HPI Javier Rivera is a 56 y.o. male.  HPI   Pt is a 56 yo male with PMH of HTN, DM, CVA who presents to the ED via EMS with multiple complaints. Pt reports he has been having weakness for the past year with Associated numbness to his arms and legs. Patient reports he has fallen twice over the past week due to his legs giving out on him. He reports associated pain to his right hip which he states is worse with movement. Patient reports recently being released from jail in January and notes he has not been taking any of his home medications getting out. Patient states he is currently homeless. Denies known fever, headache, dizziness, chest pain, shortness of breath, abdominal pain, vomiting, diarrhea, urinary symptoms, leg swelling.  Past Medical History:  Diagnosis Date  . Diabetes mellitus without complication ([place])   . Hypertension   . Renal disorder    pt stated that masses were found on his kidneys  . Renal insufficiency   . Stroke [facility])     Patient Active Problem List   Diagnosis Date Noted  . Thoracic aortic aneurysm without rupture ([place])   . Chest pain   . Hypertensive urgency   . Slow transit constipation 11/25/2015  . Right sided weakness 10/17/2015  . DM type 2, goal A1c below 7 10/17/2015  . Uncontrolled hypertension 10/17/2015  . CVA (cerebral infarction) 10/17/2015  . Sensory disturbance 04/25/2015  . Ascending aorta dilatation (HCC)   . Protein-calorie malnutrition, severe ([place]) 04/24/2015  . Heart block AV second degree 04/23/2015  . Dizziness 04/23/2015  . Diabetes mellitus type 2, controlled ([place]) 04/23/2015  . Essential hypertension 04/23/2015  . Tobacco abuse 04/23/2015  . AV block, 2nd degree   . SEBORRHEIC DERMATITIS 05/07/2010  . RECTAL BLEEDING 08/14/2009  . DIABETES  MELLITUS, TYPE II, UNCONTROLLED 06/11/2009  . Hyperlipidemia 06/11/2009  . Cocaine abuse 06/11/2009  . ESSENTIAL HYPERTENSION 06/11/2009  . DIASTOLIC DYSFUNCTION 42/59/5638  . RENAL INSUFFICIENCY 06/11/2009    Past Surgical History:  Procedure Laterality Date  . KNEE SURGERY     bilateral       Home Medications    Prior to Admission medications   Medication Sig Start Date End Date Taking? Authorizing Provider  amLODipine (NORVASC) 10 MG tablet Take 1 tablet (10 mg total) by mouth daily. 03/29/16   Velna Ochs, MD  aspirin EC 81 MG tablet Take 1 tablet (81 mg total) by mouth daily. 03/29/16   Velna Ochs, MD  atorvastatin (LIPITOR) 40 MG tablet Take 1 tablet (40 mg total) by mouth daily at 6 PM. 03/29/16   Velna Ochs, MD  gabapentin (NEURONTIN) 100 MG capsule Take 1 capsule (100 mg total) by mouth 3 (three) times daily. 03/29/16   Velna Ochs, MD  glipiZIDE (GLUCOTROL) 10 MG tablet Take 1 tablet (10 mg total) by mouth daily before breakfast. 03/29/16   Velna Ochs, MD  hydrochlorothiazide (HYDRODIURIL) 25 MG tablet Take 25 mg by mouth daily.    Historical Provider, MD  ondansetron (ZOFRAN) 4 MG tablet Take 1 tablet (4 mg total) by mouth every 8 (eight) hours as needed for nausea or vomiting. Patient not taking: Reported on 05/16/2016 03/31/16   Verner Mould, MD    Family History Family History  Problem  Relation Age of Onset  . Diabetes Mellitus II Mother   . Hypertension Mother   . Diabetes Mellitus II Sister   . Hypertension Sister     Social History Social History  Substance Use Topics  . Smoking status: Light Tobacco Smoker    Packs/day: 0.25    Years: 20.00    Types: Cigarettes  . Smokeless tobacco: Never Used  . Alcohol use No     Allergies   Clonidine derivatives and Lisinopril   Review of Systems Review of Systems  Musculoskeletal: Positive for arthralgias (right hip).  Neurological: Positive for weakness (chronic) and  numbness (chronic).  All other systems reviewed and are negative.    Physical Exam Updated Vital Signs BP (!) 163/122   Pulse 80   Temp 98.1 F (36.7 C) (Oral)   Resp (!) 27   Ht 6\' 2"  (1.88 m)   Wt 81.6 kg   SpO2 96%   BMI 23.11 kg/m   Physical Exam  Constitutional: He is oriented to person, place, and time. He appears well-developed and well-nourished.  Pt is disheveled and unkept.  HENT:  Head: Normocephalic and atraumatic. Head is without raccoon's eyes, without Battle's sign, without abrasion, without contusion and without laceration.  Right Ear: Tympanic membrane normal. No hemotympanum.  Left Ear: Tympanic membrane normal. No hemotympanum.  Nose: Nose normal.  Mouth/Throat: Uvula is midline, oropharynx is clear and moist and mucous membranes are normal. No oropharyngeal exudate, posterior oropharyngeal edema, posterior oropharyngeal erythema or tonsillar abscesses. No tonsillar exudate.  Eyes: Conjunctivae and EOM are normal. Pupils are equal, round, and reactive to light. Right eye exhibits no discharge. Left eye exhibits no discharge. No scleral icterus.  Neck: Normal range of motion. Neck supple.  Cardiovascular: Normal rate, regular rhythm, normal heart sounds and intact distal pulses.   Pulmonary/Chest: Effort normal and breath sounds normal. No respiratory distress. He has no wheezes. He has no rales. He exhibits no tenderness.  Abdominal: Soft. Bowel sounds are normal. He exhibits no distension and no mass. There is no tenderness. There is no rebound and no guarding. No hernia.  Musculoskeletal: Normal range of motion. He exhibits tenderness. He exhibits no edema or deformity.  TTP over right anterior hip with dec ROM due to reported pain. FROM of BUE, left LE and right knee/ankle/foot with 5/5 strength. Sensation grossly intact. 2 + radial and PT pulse.   Neurological: He is alert and oriented to person, place, and time. He has normal strength. No cranial nerve  deficit or sensory deficit.  Skin: Skin is warm and dry.  Nursing note and vitals reviewed.    ED Treatments / Results  Labs (all labs ordered are listed, but only abnormal results are displayed) Labs Reviewed  CBC WITH DIFFERENTIAL/PLATELET - Abnormal; Notable for the following:       Result Value   WBC 3.7 (*)    All other components within normal limits  BASIC METABOLIC PANEL - Abnormal; Notable for the following:    Glucose, Bld 116 (*)    All other components within normal limits  HEPATIC FUNCTION PANEL  URINALYSIS, ROUTINE W REFLEX MICROSCOPIC  I-STAT TROPOININ, ED    EKG  EKG Interpretation  Date/Time:  Thursday October 27 2016 15:42:43 EDT Ventricular Rate:  86 PR Interval:    QRS Duration: 87 QT Interval:  385 QTC Calculation: 461 R Axis:   65 Text Interpretation:  Sinus rhythm Anteroseptal infarct, old Nonspecific T abnormalities, lateral leads Baseline wander in lead(s) V3  Since last EKG, TWI in lateral leads, whicih are noted previously Confirmed by ISAACS MD, Lysbeth Galas [id number]) on 10/27/2016 4:05:20 PM       Radiology Ct Head Wo Contrast  Result Date: 10/27/2016 CLINICAL DATA:  Weakness and recent falls EXAM: CT HEAD WITHOUT CONTRAST TECHNIQUE: Contiguous axial images were obtained from the base of the skull through the vertex without intravenous contrast. COMPARISON:  03/29/2016 FINDINGS: Brain: Mild atrophic and white matter ischemic changes are noted. No findings to suggest acute hemorrhage, acute infarction or space-occupying mass lesion are noted. Vascular: No hyperdense vessel or unexpected calcification. Skull: Normal. Negative for fracture or focal lesion. Sinuses/Orbits: No acute finding. Other: None. IMPRESSION: Mild atrophic and ischemic changes without acute abnormality. Electronically Signed   By: Inez Catalina M.D.   On: 10/27/2016 14:49   Dg Hip Unilat W Or Wo Pelvis 2-3 Views Right  Result Date: 10/27/2016 CLINICAL DATA:  Right hip pain.  Possible  fall. EXAM: DG HIP (WITH OR WITHOUT PELVIS) 2-3V RIGHT COMPARISON:  None. FINDINGS: Minimal right femoral head and neck junction spur formation. Mild lower lumbar spine degenerative changes. No fracture or dislocation. IMPRESSION: Minimal right hip degenerative changes and mild lower lumbar spine degenerative changes. Electronically Signed   By: Claudie Revering M.D.   On: 10/27/2016 14:09    Procedures Procedures (including critical care time)  Medications Ordered in ED Medications  iopamidol (ISOVUE-370) 76 % injection (not administered)  ibuprofen (ADVIL,MOTRIN) tablet 600 mg (600 mg Oral Given 10/27/16 1519)  sodium chloride 0.9 % bolus 1,000 mL (1,000 mLs Intravenous New Bag/Given 10/27/16 1606)     Initial Impression / Assessment and Plan / ED Course  I have reviewed the triage vital signs and the nursing notes.  Pertinent labs & imaging results that were available during my care of the patient were reviewed by me and considered in my medical decision making (see chart for details).    Patient presents with reported chronic weakness and numbness. Patient was brought in by EMS after being found sleeping on cement under bridge. Patient reports falling twice this week and endorses associated right hip pain. Denies currently taking any medications, reports last taking meds in [place] prior to getting released from jail. VSS. Exam showed TTP over right anterior hip with dec ROM due to reported pain. Bilateral upper and lower extremities otherwise neurovascularly intact. No evidence of head injury. No neuro deficits. Discussed pt with Dr. Ralene Bathe who also evaluated pt in the ED. Plan to order basic labs, right hip xray and CT head due to pt with multiple falls this week and poor historian.  Right hip x-ray showed minimal right hip degenerative changes. CT head with mild atrophic and ischemic changes without acute abnormality. Labs unremarkable. During trial of ambulating pt, nurse reports pt was very  unsteady and unable to ambulate short distance across the room with holding on for assistance. Nurse reports pt's HR increased to 120 after sitting and resting in bed. Chart review shows pt with hx of 4.6cm ascending thoracic aortic aneurysm. Discussed new changes with Dr. Ralene Bathe. Plan to order EKG, CT angio chest/abd/pelvis for further evaluation of aneurysm due to pt with tachycardia, weakness and difficulty ambulating.   Hand-off to Dr. Myrene Buddy. Dispo pending imaging.  Final Clinical Impressions(s) / ED Diagnoses   Final diagnoses:  None    New Prescriptions New Prescriptions   No medications on file     Nona Dell, PA-C 10/27/16 [place], MD 10/28/16  0834  

## 2016-10-27 NOTE — ED Triage Notes (Signed)
Pt has been experiencing difficulty walking for approx one year.  He is homeless and was founding sleeping on cement [place] a bridge.  EMS reports numbness in both arms and his left leg won't work.  When asked how he takes care of himself, pt stated "I don't know how I take care of myself."  Difficult historian but is A & O x 4.

## 2016-10-27 NOTE — ED Notes (Signed)
Pt unable to ambulate independently. Pt cannot states he cannot maintain balance while ambulating.

## 2016-10-28 ENCOUNTER — Observation Stay (HOSPITAL_COMMUNITY): Payer: Self-pay

## 2016-10-28 LAB — BASIC METABOLIC PANEL
Anion gap: 9 (ref 5–15)
BUN: 8 mg/dL (ref 6–20)
CALCIUM: 8.4 mg/dL — AB (ref 8.9–10.3)
CO2: 25 mmol/L (ref 22–32)
Chloride: 105 mmol/L (ref 101–111)
Creatinine, Ser: 0.97 mg/dL (ref 0.61–1.24)
GFR calc Af Amer: >60 mL/min (ref >60)
GLUCOSE: 91 mg/dL (ref 65–99)
Potassium: 3.1 mmol/L — ABNORMAL LOW (ref 3.5–5.1)
Sodium: 139 mmol/L (ref 135–145)

## 2016-10-28 LAB — CBC
HCT: 44.8 % (ref 39.0–52.0)
Hemoglobin: 15.2 g/dL (ref 13.0–17.0)
MCH: 29.7 pg (ref 26.0–34.0)
MCHC: 33.9 g/dL (ref 30.0–36.0)
MCV: 87.7 fL (ref 78.0–100.0)
Platelets: 196 10*3/uL (ref 150–400)
RBC: 5.11 MIL/uL (ref 4.22–5.81)
RDW: 12.5 % (ref 11.5–15.5)
WBC: 3.5 10*3/uL — ABNORMAL LOW (ref 4.0–10.5)

## 2016-10-28 LAB — GLUCOSE, CAPILLARY
GLUCOSE-CAPILLARY: 129 mg/dL — AB (ref 65–99)
GLUCOSE-CAPILLARY: 103 mg/dL — AB (ref 65–99)
GLUCOSE-CAPILLARY: 114 mg/dL — AB (ref 65–99)
Glucose-Capillary: 101 mg/dL — ABNORMAL HIGH (ref 65–99)

## 2016-10-28 LAB — TROPONIN I
Troponin I: <0.03 ng/mL (ref <0.03)
Troponin I: <0.03 ng/mL (ref <0.03)
Troponin I: <0.03 ng/mL (ref <0.03)

## 2016-10-28 LAB — RAPID URINE DRUG SCREEN, HOSP PERFORMED
AMPHETAMINES: NOT DETECTED
BARBITURATES: NOT DETECTED
BENZODIAZEPINES: NOT DETECTED
COCAINE: NOT DETECTED
Opiates: NOT DETECTED
Tetrahydrocannabinol: NOT DETECTED

## 2016-10-28 LAB — HIV ANTIBODY (ROUTINE TESTING W REFLEX): HIV SCREEN 4TH GENERATION: NONREACTIVE

## 2016-10-28 LAB — LIPID PANEL
CHOLESTEROL: 164 mg/dL (ref 0–200)
HDL: 41 mg/dL (ref >40)
LDL Cholesterol: 110 mg/dL — ABNORMAL HIGH (ref 0–99)
TRIGLYCERIDES: 67 mg/dL (ref <150)
Total CHOL/HDL Ratio: 4 RATIO
VLDL: 13 mg/dL (ref 0–40)

## 2016-10-28 LAB — CK: Total CK: 258 U/L (ref 49–397)

## 2016-10-28 LAB — VITAMIN B12: VITAMIN B 12: 279 pg/mL (ref 180–914)

## 2016-10-28 LAB — TSH: TSH: 2.273 u[IU]/mL (ref 0.350–4.500)

## 2016-10-28 MED ORDER — POTASSIUM CHLORIDE CRYS ER 20 MEQ PO TBCR
40.0000 meq | EXTENDED_RELEASE_TABLET | Freq: Once | ORAL | Status: AC
  Administered 2016-10-28: 40 meq via ORAL
  Filled 2016-10-28: qty 2

## 2016-10-28 MED ORDER — HYDRALAZINE HCL 20 MG/ML IJ SOLN
5.0000 mg | INTRAMUSCULAR | Status: DC | PRN
  Administered 2016-10-28 (×2): 5 mg via INTRAVENOUS
  Filled 2016-10-28 (×2): qty 1

## 2016-10-28 NOTE — NC FL2 (Signed)
[place] LEVEL OF CARE SCREENING TOOL     IDENTIFICATION  Patient Name: Javier Rivera Birthdate: 03-10-1961 Sex: male Admission Date (Current Location): 10/27/2016  [facility] and [place] Number:  Herbalist and Address:  [facility], [place] [address], [place], [place] [address]      Provider Number: [id number]  Attending Physician Name and Address:  Theodis Blaze, MD  Relative Name and Phone Number:       Current Level of Care: Hospital Recommended Level of Care: [place] Prior Approval Number:    Date Approved/Denied:   PASRR Number:    Discharge Plan: SNF    Current Diagnoses: Patient Active Problem List   Diagnosis Date Noted  . Weakness 10/27/2016  . Fall 10/27/2016  . Constipation 10/27/2016  . Thoracic aortic aneurysm without rupture ([place])   . Chest pain   . Hypertensive urgency   . Slow transit constipation 11/25/2015  . Right sided weakness 10/17/2015  . DM type 2, goal A1c below 7 10/17/2015  . Uncontrolled hypertension 10/17/2015  . Cerebral infarction ([place]) 10/17/2015  . Sensory disturbance 04/25/2015  . Ascending aorta dilatation (HCC)   . Protein-calorie malnutrition, severe ([place]) 04/24/2015  . Heart block AV second degree 04/23/2015  . Dizziness 04/23/2015  . Diabetes mellitus type 2, controlled ([place]) 04/23/2015  . Essential hypertension 04/23/2015  . Tobacco abuse 04/23/2015  . AV block, 2nd degree   . SEBORRHEIC DERMATITIS 05/07/2010  . RECTAL BLEEDING 08/14/2009  . Diabetes mellitus without complication ([place]) 70/17/7939  . Hyperlipidemia 06/11/2009  . Cocaine abuse 06/11/2009  . ESSENTIAL HYPERTENSION 06/11/2009  . DIASTOLIC DYSFUNCTION 03/00/9233  . RENAL INSUFFICIENCY 06/11/2009    Orientation RESPIRATION BLADDER Height & Weight     Self, Time, Situation, Place  Normal Continent Weight: 91.6 kg (201 lb 15.1 oz) Height:  6\' 9"  (205.7 cm)  BEHAVIORAL SYMPTOMS/MOOD  NEUROLOGICAL BOWEL NUTRITION STATUS      Continent Diet (Please see DC Summary)  AMBULATORY STATUS COMMUNICATION OF NEEDS Skin   Limited Assist Verbally Other (Comment) (Diabetic ulcer on toe)                       Personal Care Assistance Level of Assistance  Bathing, Feeding, Dressing Bathing Assistance: Limited assistance Feeding assistance: Independent Dressing Assistance: Independent     Functional Limitations Info             SPECIAL CARE FACTORS FREQUENCY  PT (By licensed PT), OT (By licensed OT)     PT Frequency: 5x/week OT Frequency: 3x/weel            Contractures      Additional Factors Info  Code Status, Allergies, Psychotropic, Insulin Sliding Scale Code Status Info: Full Allergies Info:  Clonidine Derivatives, Lisinopril Psychotropic Info: Zoloft Insulin Sliding Scale Info: 3x daily with meals and at bedtime       Current Medications (10/28/2016):  This is the current hospital active medication list Current Facility-Administered Medications  Medication Dose Route Frequency Provider Last Rate Last Dose  . 0.9 %  sodium chloride infusion   Intravenous Continuous Theodis Blaze, MD 75 mL/hr at 10/28/16 1538    . acetaminophen (TYLENOL) tablet 650 mg  650 mg Oral Q6H PRN Ivor Costa, MD       Or  . acetaminophen (TYLENOL) suppository 650 mg  650 mg Rectal Q6H PRN Ivor Costa, MD      . amLODipine (NORVASC) tablet  10 mg  10 mg Oral Daily Ivor Costa, MD   10 mg at 10/28/16 0912  . aspirin EC tablet 81 mg  81 mg Oral Daily Ivor Costa, MD   81 mg at 10/28/16 0911  . atorvastatin (LIPITOR) tablet 40 mg  40 mg Oral q1800 Ivor Costa, MD      . enoxaparin (LOVENOX) injection 40 mg  40 mg Subcutaneous Q24H Ivor Costa, MD   40 mg at 10/28/16 0911  . feeding supplement (ENSURE ENLIVE) (ENSURE ENLIVE) liquid 237 mL  237 mL Oral BID BM Ivor Costa, MD   237 mL at 10/28/16 1342  . gabapentin (NEURONTIN) capsule 100 mg  100 mg Oral TID Ivor Costa, MD   100 mg at 10/28/16 1536   . hydrALAZINE (APRESOLINE) injection 5 mg  5 mg Intravenous Q2H PRN Jeryl [place], NP   5 mg at 10/28/16 0409  . insulin aspart (novoLOG) injection 0-5 Units  0-5 Units Subcutaneous QHS Ivor Costa, MD      . insulin aspart (novoLOG) injection 0-9 Units  0-9 Units Subcutaneous TID WC Ivor Costa, MD   1 Units at 10/28/16 1346  . nicotine (NICODERM CQ - dosed in mg/24 hours) patch 21 mg  21 mg Transdermal Daily Ivor Costa, MD      . ondansetron [facility]) tablet 4 mg  4 mg Oral Q6H PRN Ivor Costa, MD       Or  . ondansetron [facility]) injection 4 mg  4 mg Intravenous Q6H PRN Ivor Costa, MD      . oxyCODONE-acetaminophen (PERCOCET/ROXICET) 5-325 MG per tablet 1 tablet  1 tablet Oral Q4H PRN Ivor Costa, MD   1 tablet at 10/28/16 1342  . polyethylene glycol (MIRALAX / GLYCOLAX) packet 17 g  17 g Oral Daily PRN Ivor Costa, MD      . senna-docusate (Senokot-S) tablet 1 tablet  1 tablet Oral BID Ivor Costa, MD   1 tablet at 10/28/16 0912  . sertraline (ZOLOFT) 20 MG/ML concentrated solution 25 mg  25 mg Oral Daily Ivor Costa, MD   25 mg at 10/28/16 0910  . sodium chloride flush (NS) 0.9 % injection 3 mL  3 mL Intravenous Q12H Ivor Costa, MD   3 mL at 10/28/16 0919  . zolpidem (AMBIEN) tablet 5 mg  5 mg Oral QHS PRN Ivor Costa, MD   5 mg at 10/27/16 2329     Discharge Medications: Please see discharge summary for a list of discharge medications.  Relevant Imaging Results:  Relevant Lab Results:   Additional Information SSN [id number]  Benard Halsted, [place]

## 2016-10-28 NOTE — Evaluation (Signed)
Occupational Therapy Evaluation Patient Details Name: Javier Rivera MRN: [id number] DOB: 01-29-1961 Today's Date: 10/28/2016    History of Present Illness Pt is a 56 yo homeless man admitted with B foot and hip pain.  Pt has this pain frequently but did take a fall.  X ray negative.  Pt with h/o HTN, CVA, acending thorasic aortic aneurysm.   Clinical Impression   Pt admitted with the above diagnosis and has the deficits listed below. Pt would benefit from continued Occupational Therapy to increase independence with basic LE adls and adl tranfers so he can function independently again.  Currently, pt is homeless and right now does require significant assist with his mobility and adls and is not independent enough to be in most shelters or on the street.  Feel he needs to increase his independence and strength before this is possible.      Follow Up Recommendations  SNF;Supervision/Assistance - 24 hour    Equipment Recommendations  3 in 1 bedside commode;Other (comment) (states he needs a new walker)    Recommendations for Other Services       Precautions / Restrictions Precautions Precautions: Fall Restrictions Weight Bearing Restrictions: No      Mobility Bed Mobility Overal bed mobility: Modified Independent             General bed mobility comments: Pt used bed rail and HOB was at 30 degrees.  Transfers Overall transfer level: Needs assistance Equipment used: Rolling walker (2 wheeled);1 person hand held assist Transfers: Sit to/from [facility] Sit to Stand: Min assist;From elevated surface Stand pivot transfers: Min assist;From elevated surface       General transfer comment: Pt willing to go to chair but no further.  Pt has difficulty standing up straight due to pain.    Balance Overall balance assessment: Needs assistance Sitting-balance support: Feet supported Sitting balance-Leahy Scale: Good     Standing balance support: Bilateral upper  extremity supported;During functional activity Standing balance-Leahy Scale: Poor Standing balance comment: Pt must have outside support to remain standing but is not always willing to use walker.                            ADL Overall ADL's : Needs assistance/impaired Eating/Feeding: Independent;Sitting   Grooming: Set up;Sitting   Upper Body Bathing: Set up;Sitting   Lower Body Bathing: Moderate assistance;Sit to/from stand   Upper Body Dressing : Set up;Sitting   Lower Body Dressing: Moderate assistance;Sit to/from stand   Toilet Transfer: Minimal assistance;Stand-pivot;RW Toilet Transfer Details (indicate cue type and reason): pt unwilling/unable to stand up straight due to pain. Toileting- Clothing Manipulation and Hygiene: Minimal assistance;Sit to/from stand       Functional mobility during ADLs: Minimal assistance;Rolling walker General ADL Comments: Pt able to do all UE adls.   Due to pain in LEs, pt has more difficulty with LE adls requring assist to donn socks and shoes and access lower legs.  Pt unwilling to be introduced to adaptive equipment at this time but will attempt again.     Vision Baseline Vision/History:  (unsure.  Pt could see what was on his plate.) Patient Visual Report: No change from baseline Vision Assessment?: No apparent visual deficits     Perception     Praxis      Pertinent Vitals/Pain Pain Assessment: Faces Faces Pain Scale: Hurts even more Pain Location: hips and feet Pain Descriptors / Indicators: Grimacing;Aching Pain Intervention(s): Limited activity  within patient's tolerance;Monitored during session;Premedicated before session;Repositioned     Hand Dominance Right   Extremity/Trunk Assessment Upper Extremity Assessment Upper Extremity Assessment: Generalized weakness   Lower Extremity Assessment Lower Extremity Assessment: Defer to PT evaluation   Cervical / Trunk Assessment Cervical / Trunk Assessment:  Kyphotic   Communication Communication Communication: No difficulties;Other (comment) (chooses to not speak much)   Cognition Arousal/Alertness: Awake/alert Behavior During Therapy: Flat affect Overall Cognitive Status: Difficult to assess                 General Comments: Pt unwiling to answer many questions.  Pt did answer some and was accurate.     General Comments  Pt very resistant to therapy, not wanting to answer questions.    Exercises       Shoulder Instructions      Home Living Family/patient expects to be discharged to:: Shelter/Homeless                                 Additional Comments: Pt unwilling to answer many questions.      Prior Functioning/Environment Level of Independence: Independent with assistive device(s)        Comments: pt states he has a walker that is broken.        OT Problem List: Decreased strength;Decreased activity tolerance;Impaired balance (sitting and/or standing);Decreased safety awareness;Decreased knowledge of use of DME or AE;Decreased knowledge of precautions;Pain      OT Treatment/Interventions: Self-care/ADL training;Therapeutic activities;DME and/or AE instruction;Balance training    OT Goals(Current goals can be found in the care plan section) Acute Rehab OT Goals Patient Stated Goal: to stop hurting OT Goal Formulation:  (pt not choosing to participate in goal setting) Time For Goal Achievement: 11/11/16 Potential to Achieve Goals: Fair ADL Goals Pt Will Perform Grooming: with supervision;standing Pt Will Perform Lower Body Bathing: with supervision;sit to/from stand Pt Will Perform Lower Body Dressing: with supervision;with adaptive equipment;sit to/from stand Additional ADL Goal #1: Pt will walk to bathroom wtih walker and toilet with S on raised commode or 3:1 over commode.  OT Frequency: Min 2X/week   Barriers to D/C: Decreased caregiver support  pt is homeless       Co-evaluation               End of Session Equipment Utilized During Treatment: Surveyor, mining Communication: Mobility status;Other (comment) (need for chair alarm when up despite his resistance.)  Activity Tolerance: Patient limited by pain Patient left: in chair;with call bell/phone within reach;Other (comment) (would not allow chair alarm.  nursing aware and is in room.)  OT Visit Diagnosis: Unsteadiness on feet (R26.81)                ADL either performed or assessed with clinical judgement  Time: 0853-0910 OT Time Calculation (min): 17 min Charges:  OT General Charges $OT Visit: 1 Procedure OT Evaluation $OT Eval Moderate Complexity: 1 Procedure G-Codes: OT G-codes **NOT FOR INPATIENT CLASS** Functional Assessment Tool Used: Clinical judgement Functional Limitation: Self care Self Care Current Status (J1791): At least 40 percent but less than 60 percent impaired, limited or restricted Self Care Goal Status (T0569): At least 1 percent but less than 20 percent impaired, limited or restricted   Jinger Neighbors, OTR/L [phone]  Glenford Peers 10/28/2016, 9:29 AM

## 2016-10-28 NOTE — Progress Notes (Signed)
Patient ID: Javier Rivera, male   DOB: 1960/08/30, 56 y.o.   MRN: [id number]    PROGRESS NOTE    Javier Rivera  WUJ:[id number] DOB: 1961-05-20 DOA: 10/27/2016  PCP: Pt says he has no PCP   Brief Narrative:  56 y.o. male who is homeless and with hypertension, hyperlipidemia, stroke, ascending thoracic aortic aneurysm, tobacco abuse, cocaine abuse, who presented with weakness, fall, right hip pain.  ED Course: pt was found to have positive orthostatic vital signs, negative troponin, WBC 3.7, urinalysis negative, electrolytes renal function okay, temperature normal, no tachycardia, oxygen saturation 96% on room air. Blood pressure is elevated 183/135. Pt is placed on tele bed for obs.  Assessment & Plan:  Weakness - Patient has generalized weakness for more than a year - On physical examination he seems to have right-sided weakness - Not sure if this is sequela from previous stroke or new issue - no stroke on MRI - PT saw pt and SNF recommended  - SW consulted for assistance   DM-II - Last A1c 5.7 on 10/19/15, well controled - Patient was on glipizide before, but not taking it now - A1C pending   HLD - Last LDL was 115 on 10/19/15. Not taking home Lipitor - Resumed home medications: Lipitor  Hypokalemia - supplement - BMP in AM  HTN, essential  - Amlodipine - continue IV hydralazine when necessary  Tobacco abuse - counseled about importance of quitting smoking - allow Nicotine patch  Protein-calorie malnutrition, severe:  - ok to provide Ensure  Chest pain - Patient stated that he has mild chest pain sometimes, but not this AM - trop negative  Constipation - MiraLAX, senokot  Fall:  - Likely multifactorial etiology, including history of stroke, generalized weakness, orthostatic status, dehydration. CT head negative - PT/OT - SNF recommended, SW consulted for SNF placement   Hx of stroke - ASA and lipitor  Ascending aorta dilatation, Thoracic aortic  aneurysm without rupture and AAA - CT angiogram showed aneurysmal dilatation of the ascending aorta up to 4.7 cm and mild aneurysmal dilatation of the infrarenal aorta to 3.0 cm. No CTA evidence for acute aortic dissection. - May need to give referral to cardiothoracic surgery  Right hip pain - No fracture by x-ray. - When necessary Tylenol and Percocet for pain  DVT prophylaxis: Lovenox SQ Code Status: Full  Family Communication: Patient at bedside  Disposition Plan: to be determined   Consultants:   None  Procedures:   None  Antimicrobials:   None  Subjective: Pt reports not feeling well, more weak and tired, generalized pain.   Objective: Vitals:   10/28/16 0457 10/28/16 0509 10/28/16 0848 10/28/16 1400  BP: (!) 174/103  (!) 159/86 (!) 144/73  Pulse: (!) 102 (!) 107 (!) 101 (!) 102  Resp:  18  18  Temp:  98.7 F (37.1 C)  98 F (36.7 C)  TempSrc:    Oral  SpO2:  100% 100% 99%  Weight:      Height:        Intake/Output Summary (Last 24 hours) at 10/28/16 1415 Last data filed at 10/28/16 1359  Gross per 24 hour  Intake             2927 ml  Output             1000 ml  Net             1927 ml   Filed Weights   10/27/16 1300 10/27/16 2255  Weight:  81.6 kg (180 lb) 91.6 kg (201 lb 15.1 oz)    Examination:  General exam: Appears calm and comfortable  Respiratory system: Respiratory effort normal. Diminished breath sounds at bases  Cardiovascular system: S1 & S2 heard, RRR. No rubs, gallops or clicks. No pedal edema. Gastrointestinal system: Abdomen is nondistended, soft and nontender. No organomegaly or masses felt.  Central nervous system: Alert and oriented. No focal neurological deficits. Extremities: Symmetric 5 x 5 power.  Data Reviewed: I have personally reviewed following labs and imaging studies  CBC:  Recent Labs Lab 10/27/16 1410 10/28/16 0508  WBC 3.7* 3.5*  NEUTROABS 2.0  --   HGB 16.6 15.2  HCT 48.3 44.8  MCV 87.3 87.7  PLT 211  854   Basic Metabolic Panel:  Recent Labs Lab 10/27/16 1410 10/28/16 0508  NA 137 139  K 3.5 3.1*  CL 102 105  CO2 26 25  GLUCOSE 116* 91  BUN 13 8  CREATININE 0.95 0.97  CALCIUM 9.0 8.4*   Cardiac Enzymes:  Recent Labs Lab 10/27/16 2329 10/28/16 0508 10/28/16 1045  CKTOTAL 258  --   --   TROPONINI <0.03 <0.03 <0.03   CBG:  Recent Labs Lab 10/27/16 2303 10/28/16 0818 10/28/16 1214  GLUCAP 180* 101* 129*   Lipid Profile:  Recent Labs  10/28/16 0508  CHOL 164  HDL 41  LDLCALC 110*  TRIG 67  CHOLHDL 4.0   Thyroid Function Tests:  Recent Labs  10/28/16 0508  TSH 2.273   Anemia Panel:  Recent Labs  10/28/16 0508  VITAMINB12 279   Urine analysis:    Component Value Date/Time   COLORURINE YELLOW 10/27/2016 1540   APPEARANCEUR CLEAR 10/27/2016 1540   LABSPEC 1.021 10/27/2016 1540   PHURINE 6.0 10/27/2016 1540   GLUCOSEU 50 (A) 10/27/2016 1540   HGBUR NEGATIVE 10/27/2016 1540   BILIRUBINUR NEGATIVE 10/27/2016 1540   KETONESUR NEGATIVE 10/27/2016 1540   PROTEINUR 30 (A) 10/27/2016 1540   UROBILINOGEN 2.0 (H) 04/23/2015 0843   NITRITE NEGATIVE 10/27/2016 1540   LEUKOCYTESUR NEGATIVE 10/27/2016 1540   Radiology Studies: Ct Head Wo Contrast  Result Date: 10/27/2016 CLINICAL DATA:  Weakness and recent falls EXAM: CT HEAD WITHOUT CONTRAST TECHNIQUE: Contiguous axial images were obtained from the base of the skull through the vertex without intravenous contrast. COMPARISON:  03/29/2016 FINDINGS: Brain: Mild atrophic and white matter ischemic changes are noted. No findings to suggest acute hemorrhage, acute infarction or space-occupying mass lesion are noted. Vascular: No hyperdense vessel or unexpected calcification. Skull: Normal. Negative for fracture or focal lesion. Sinuses/Orbits: No acute finding. Other: None. IMPRESSION: Mild atrophic and ischemic changes without acute abnormality. Electronically Signed   By: Javier Rivera M.D.   On: 10/27/2016  14:49   Mr Brain Wo Contrast  Result Date: 10/28/2016 CLINICAL DATA:  Right-sided weakness EXAM: MRI HEAD WITHOUT CONTRAST TECHNIQUE: Multiplanar, multiecho pulse sequences of the brain and surrounding structures were obtained without intravenous contrast. COMPARISON:  Head CT 10/27/2016 Brain MRI 10/17/2015 FINDINGS: Brain: No focal diffusion restriction to indicate acute infarct. No intraparenchymal hemorrhage. There is beginning confluent hyperintense T2-weighted signal within the periventricular, deep and subcortical white matter, most often seen in the setting of chronic microvascular ischemia. No mass lesion or midline shift. No hydrocephalus or extra-axial fluid collection. The midline structures are normal. No age advanced or lobar predominant atrophy. Vascular: Major intracranial arterial and venous sinus flow voids are preserved. No evidence of chronic microhemorrhage or amyloid angiopathy. Skull and upper cervical spine:  The visualized skull base, calvarium, upper cervical spine and extracranial soft tissues are normal. Sinuses/Orbits: No fluid levels or advanced mucosal thickening. No mastoid effusion. Normal orbits. IMPRESSION: 1. No acute intracranial abnormality. 2. 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 Electronically Signed   By: Ulyses Jarred M.D.   On: 10/28/2016 06:52   Ct Angio Chest/abd/pel For Dissection W And/or Wo Contrast  Result Date: 10/27/2016 CLINICAL DATA:  Initial evaluation for acute weakness, tachycardia, or right groin/ thigh/ hip pain. EXAM: CT ANGIOGRAPHY CHEST, ABDOMEN AND PELVIS TECHNIQUE: Multidetector CT imaging through the chest, abdomen and pelvis was performed using the standard protocol during bolus administration of intravenous contrast. Multiplanar reconstructed images and MIPs were obtained and reviewed to evaluate the vascular anatomy. CONTRAST:  100 cc of Isovue 370. COMPARISON:  Prior radiograph from 03/29/2016. FINDINGS: CTA CHEST FINDINGS  Cardiovascular: Precontrast imaging through the chest demonstrate scattered atheromatous plaque within the ascending aorta and aortic arch. No mural thrombus or other complication. Postcontrast imaging demonstrates no evidence for acute aortic dissection. Ascending aorta dilated up to 4.7 cm in maximal diameter (series 5, image 54). Minimal plaque about the origin of the left subclavian artery without stenosis. Partially visualized great vessels widely patent and within normal limits. Heart size within normal limits. Scattered 3 vessel coronary artery calcifications. No pericardial effusion. Limited evaluation of the pulmonary arteries grossly unremarkable. Mediastinum/Nodes: Thyroid normal. No pathologically enlarged mediastinal, hilar, or axillary lymph nodes. Esophagus within normal limits. Lungs/Pleura: Mild deep tendon atelectasis within the lungs. No focal infiltrates. No pulmonary edema or pleural effusion. No pneumothorax. No worrisome pulmonary nodule or mass. Musculoskeletal: No acute osseous abnormality. No worrisome lytic or blastic osseous lesions. Review of the MIP images confirms the above findings. CTA ABDOMEN AND PELVIS FINDINGS VASCULAR Aorta: No evidence for acute dissection or other abnormality within the intra-abdominal aorta. Infrarenal aorta dilated 3.0 cm. Mild to moderate calcified and noncalcified plaque throughout the aorta itself. Celiac: Celiac axis and its branch vessels widely patent. SMA: SMA widely patent. Renals: Single renal arteries present bilaterally, both widely patent. IMA: IMA patent. Inflow: Extensive atheromatous plaque about the common iliac arteries bilaterally just distal to the bifurcation. Small focal penetrating atheromatous ulcer noted at the proximal right common iliac artery (series 5, image 158). Common iliac arteries ectatic measuring up to 14 mm on the right and 16 mm on the left. Moderate stenosis at the origin of the right internal iliac artery of  approximately 50-60% (series 5, image 174). More mild to moderate narrowing at the origin of the left internal iliac artery. Extensive atheromatous plaque throughout the internal iliac vessels bilaterally. Right external iliac artery patent. Proximal femoral arteries within the thigh widely patent without abnormality. Left external iliac artery demonstrates extensive atheromatous plaque without significant stenosis. Visualized proximal vessels within the left thigh patent without high-grade stenosis or other acute abnormality. Veins: No venous abnormality. Review of the MIP images confirms the above findings. NON-VASCULAR Hepatobiliary: 4.5 cm hypodense lesion with peripheral nodular enhancement within the left hepatic lobe most compatible with a benign hemangioma. Liver otherwise unremarkable. Gallbladder within normal limits. No biliary dilatation. Pancreas: Pancreas within normal limits. Spleen: Spleen within normal limits. Adrenals/Urinary Tract: Approximate 2 cm hyperdense lesion within the left adrenal gland noted. Additional 3.4 cm hypodense right adrenal nodule. These are similar to previous, and most consistent with probable small adenomas. Kidneys equal in size with symmetric enhancement. Scattered bilateral renal cysts present, largest of which positioned at the lower pole the left kidney and  measures 5.4 cm. No nephrolithiasis or hydronephrosis. No focal enhancing renal mass. No hydroureter. Bladder within normal limits. Stomach/Bowel: Stomach within normal limits. No evidence for bowel obstruction. Appendix normal. Large amount of retained stool diffusely throughout the colon, suggesting constipation. No acute inflammatory changes seen about the bowels. Lymphatic: No pathologically enlarged intra-abdominal or pelvic lymph nodes. Reproductive: Prostate normal. Other: No free air or fluid. Musculoskeletal: No acute osseous abnormality. No worrisome lytic or blastic osseous lesions. Review of the MIP  images confirms the above findings. IMPRESSION: 1. No CTA evidence for acute aortic dissection or other acute vascular abnormality. 2. Aneurysmal dilatation of the ascending aorta up to 4.7 cm. Recommend semi-annual imaging followup by CTA or MRA and referral to cardiothoracic surgery if not already obtained. This recommendation follows 2010 ACCF/AHA/AATS/ACR/ASA/SCA/SCAI/SIR/STS/SVM Guidelines for the Diagnosis and Management of Patients With Thoracic Aortic Disease. Circulation. 2010; 121: e266-e36. 3. Mild aneurysmal dilatation of the infrarenal aorta to 3.0 cm. Recommend followup by ultrasound in 3 years. This recommendation follows ACR consensus guidelines: White Paper of the ACR Incidental Findings Committee II on Vascular Findings. J Am Coll Radiol 2013; 64:680-321 4. Moderate atheromatous plaque throughout the intra-abdominal aorta and bilateral iliac arteries as above, with diffuse 3 vessel coronary artery calcifications. 5. No other acute abnormality within the chest, abdomen, and pelvis. 6. Large amount of retained stool within the colon, suggesting constipation. 7. 4.5 cm benign hepatic hemangioma. Electronically Signed   By: Jeannine Boga M.D.   On: 10/27/2016 17:47   Dg Hip Unilat W Or Wo Pelvis 2-3 Views Right  Result Date: 10/27/2016 CLINICAL DATA:  Right hip pain.  Possible fall. EXAM: DG HIP (WITH OR WITHOUT PELVIS) 2-3V RIGHT COMPARISON:  None. FINDINGS: Minimal right femoral head and neck junction spur formation. Mild lower lumbar spine degenerative changes. No fracture or dislocation. IMPRESSION: Minimal right hip degenerative changes and mild lower lumbar spine degenerative changes. Electronically Signed   By: Claudie Revering M.D.   On: 10/27/2016 14:09    Scheduled Meds: . amLODipine  10 mg Oral Daily  . aspirin EC  81 mg Oral Daily  . atorvastatin  40 mg Oral q1800  . enoxaparin (LOVENOX) injection  40 mg Subcutaneous Q24H  . feeding supplement (ENSURE ENLIVE)  237 mL Oral BID  BM  . gabapentin  100 mg Oral TID  . insulin aspart  0-5 Units Subcutaneous QHS  . insulin aspart  0-9 Units Subcutaneous TID WC  . nicotine  21 mg Transdermal Daily  . senna-docusate  1 tablet Oral BID  . sertraline  25 mg Oral Daily  . sodium chloride flush  3 mL Intravenous Q12H   Continuous Infusions: . sodium chloride 100 mL/hr at 10/27/16 2330    LOS: 0 days   Time spent: 20 minutes   Faye Ramsay, MD [facility] Pager [phone]  If 7PM-7AM, please contact night-coverage [email or web address] Password [email or web address] 10/28/2016, 2:15 PM

## 2016-10-28 NOTE — Evaluation (Addendum)
Physical Therapy Evaluation Patient Details Name: Javier Rivera MRN: [id number] DOB: May 10, 1961 Today's Date: 10/28/2016   History of Present Illness  Pt is a 56 yo homeless man admitted with B foot and hip pain.  Pt has this pain frequently but did take a fall.  X ray negative.  Pt with h/o HTN, CVA, acending thorasic aortic aneurysm, tobacco use, DM, and cocaine use.   Clinical Impression  Pt admitted with above diagnosis. Pt currently with functional limitations due to the deficits listed below (see PT Problem List). Pt very resistant to answering questions about previous level of function. When asked about mobility, pt stated he had a walker and cane, but didn't know which he used. Pt did report he was walking. Per chart, pt is homeless. Upon evaluation, pt presenting with increased pain, weakness, and decreased balance. Pt requiring mod A for functional mobility tasks. Based on current presentation, recommending SNF to increase independence and safety with functional mobility.  Pt will benefit from skilled PT to increase their independence and safety with mobility to allow discharge to the venue listed below.  Will continue to follow.      Follow Up Recommendations SNF;Supervision/Assistance - 24 hour    Equipment Recommendations  Wheelchair cushion (measurements PT);Wheelchair (measurements PT);3in1 (PT) (aide 24/7)    Recommendations for Other Services       Precautions / Restrictions Precautions Precautions: Fall Restrictions Weight Bearing Restrictions: No      Mobility  Bed Mobility Overal bed mobility: Needs Assistance Bed Mobility: Supine to Sit     Supine to sit: Min assist     General bed mobility comments: Min A for trunk elevation with use of bed rails. Verbal cues for sequencing.   Transfers Overall transfer level: Needs assistance Equipment used: Rolling walker (2 wheeled);1 person hand held assist Transfers: Sit to/from Stand Sit to Stand: Mod assist;From  elevated surface         General transfer comment: Mod A required for lift assist. Verbal cues for hand placement. Pt reporting increased pain with standing.    Ambulation/Gait Ambulation/Gait assistance: Mod assist Ambulation Distance (Feet): 1 Feet Assistive device: Rolling walker (2 wheeled) Gait Pattern/deviations: Step-to pattern;Decreased stride length;Antalgic;Trunk flexed;Narrow base of support Gait velocity: Decreased  Gait velocity interpretation: Below normal speed for age/gender General Gait Details: Pt only able to take a few steps secondary to increased pain and weakness. Pt demonstrated decreased steadiness requiring mod A for stability.   Stairs            Wheelchair Mobility    Modified Rankin (Stroke Patients Only)       Balance Overall balance assessment: Needs assistance Sitting-balance support: Feet supported;No upper extremity supported Sitting balance-Leahy Scale: Good     Standing balance support: Bilateral upper extremity supported;During functional activity Standing balance-Leahy Scale: Poor Standing balance comment: Reliant on RW and mod A for standing balance.                              Pertinent Vitals/Pain Pain Assessment: Faces Faces Pain Scale: Hurts whole lot Pain Location: hips and feet Pain Descriptors / Indicators: Grimacing;Aching Pain Intervention(s): Limited activity within patient's tolerance;Monitored during session;Repositioned    Home Living Family/patient expects to be discharged to:: Shelter/Homeless                 Additional Comments: Pt unwilling to answer many questions.    Prior Function Level of Independence: Independent with  assistive device(s)         Comments: Stated he had a RW and cane, but was unsure of what he used      Hand Dominance   Dominant Hand: Right    Extremity/Trunk Assessment   Upper Extremity Assessment Upper Extremity Assessment: Defer to OT evaluation     Lower Extremity Assessment Lower Extremity Assessment: RLE deficits/detail;LLE deficits/detail RLE Deficits / Details: Reporting his feet feel like they have 3 layers of socks. When asked about neuropathy, he stated "that's an understatement." Pt with generalized weakness based on difficulty to perform functional transfers and mod A required.  LLE Deficits / Details: Reporting his feet feel like they have 3 layers of socks. When asked about neuropathy, he stated "that's an understatement." Pt with generalized weakness based on difficulty to perform functional transfers and mod A required.    Cervical / Trunk Assessment Cervical / Trunk Assessment: Kyphotic  Communication   Communication: Other (comment) (Not very participatory when questions asked)  Cognition Arousal/Alertness: Awake/alert Behavior During Therapy: Flat affect Overall Cognitive Status: No family/caregiver present to determine baseline cognitive functioning                                 General Comments: Pt not answering questions when asked. When asked about use of AD for mobility said he had cane and walker, but didn't know what he used. Pt overall with slow processing and difficulty sequencing during functional tasks.       General Comments General comments (skin integrity, edema, etc.): Pt resistant to answering questions this session. Pt homeless with no support for mobility.     Exercises     Assessment/Plan    PT Assessment Patient needs continued PT services  PT Problem List Decreased strength;Decreased range of motion;Decreased activity tolerance;Decreased balance;Decreased mobility;Decreased coordination;Decreased cognition;Decreased knowledge of use of DME;Decreased safety awareness;Pain;Impaired sensation       PT Treatment Interventions Gait training;DME instruction;Stair training;Functional mobility training;Therapeutic activities;Therapeutic exercise;Balance training;Neuromuscular  re-education;Patient/family education    PT Goals (Current goals can be found in the Care Plan section)  Acute Rehab PT Goals Patient Stated Goal: to stop hurting PT Goal Formulation: With patient Time For Goal Achievement: 11/04/16 Potential to Achieve Goals: Fair    Frequency Min 2X/week   Barriers to discharge Decreased caregiver support pt homeless    Co-evaluation               End of Session Equipment Utilized During Treatment: Gait belt Activity Tolerance: Patient limited by pain Patient left: in bed;with call bell/phone within reach;with bed alarm set Nurse Communication: Mobility status PT Visit Diagnosis: Unsteadiness on feet (R26.81);Pain;Muscle weakness (generalized) (M62.81) Pain - Right/Left:  (bilateral) Pain - part of body: Leg;Ankle and joints of foot    Time: 9242-6834 PT Time Calculation (min) (ACUTE ONLY): 17 min   Charges:   PT Evaluation $PT Eval Moderate Complexity: 1 Procedure     PT G Codes:   PT G-Codes **NOT FOR INPATIENT CLASS** Functional Assessment Tool Used: AM-PAC 6 Clicks Basic Mobility;Clinical judgement Functional Limitation: Mobility: Walking and moving around Mobility: Walking and Moving Around Current Status (H9622): At least 40 percent but less than 60 percent impaired, limited or restricted Mobility: Walking and Moving Around Goal Status [phone]): At least 1 percent but less than 20 percent impaired, limited or restricted    Nicky Pugh, PT, DPT  Acute Rehabilitation Services  Pager: [phone]  Army Melia 10/28/2016, 2:26 PM

## 2016-10-29 DIAGNOSIS — R627 Adult failure to thrive: Secondary | ICD-10-CM | POA: Diagnosis present

## 2016-10-29 DIAGNOSIS — M25551 Pain in right hip: Secondary | ICD-10-CM | POA: Diagnosis present

## 2016-10-29 DIAGNOSIS — D518 Other vitamin B12 deficiency anemias: Secondary | ICD-10-CM

## 2016-10-29 LAB — BASIC METABOLIC PANEL
Anion gap: 8 (ref 5–15)
BUN: 10 mg/dL (ref 6–20)
CALCIUM: 8.4 mg/dL — AB (ref 8.9–10.3)
CO2: 27 mmol/L (ref 22–32)
Chloride: 104 mmol/L (ref 101–111)
Creatinine, Ser: 1.13 mg/dL (ref 0.61–1.24)
Glucose, Bld: 102 mg/dL — ABNORMAL HIGH (ref 65–99)
Potassium: 3.5 mmol/L (ref 3.5–5.1)
SODIUM: 139 mmol/L (ref 135–145)

## 2016-10-29 LAB — GLUCOSE, CAPILLARY
GLUCOSE-CAPILLARY: 103 mg/dL — AB (ref 65–99)
Glucose-Capillary: 102 mg/dL — ABNORMAL HIGH (ref 65–99)

## 2016-10-29 LAB — HEMOGLOBIN A1C
HEMOGLOBIN A1C: 6.6 % — AB (ref 4.8–5.6)
MEAN PLASMA GLUCOSE: 143 mg/dL

## 2016-10-29 LAB — CBC
HCT: 41.6 % (ref 39.0–52.0)
HEMOGLOBIN: 13.8 g/dL (ref 13.0–17.0)
MCH: 29.4 pg (ref 26.0–34.0)
MCHC: 33.2 g/dL (ref 30.0–36.0)
MCV: 88.5 fL (ref 78.0–100.0)
PLATELETS: 184 10*3/uL (ref 150–400)
RBC: 4.7 MIL/uL (ref 4.22–5.81)
RDW: 12.4 % (ref 11.5–15.5)
WBC: 3.4 10*3/uL — ABNORMAL LOW (ref 4.0–10.5)

## 2016-10-29 LAB — MAGNESIUM: MAGNESIUM: 1.9 mg/dL (ref 1.7–2.4)

## 2016-10-29 MED ORDER — NICOTINE 21 MG/24HR TD PT24: 21.0000 mg | MEDICATED_PATCH | Freq: Every day | TRANSDERMAL | 0 refills | Status: DC

## 2016-10-29 MED ORDER — SENNOSIDES-DOCUSATE SODIUM 8.6-50 MG PO TABS: 1.0000 | ORAL_TABLET | Freq: Two times a day (BID) | ORAL | 0 refills | Status: DC

## 2016-10-29 MED ORDER — HYDRALAZINE HCL 25 MG PO TABS
25.0000 mg | ORAL_TABLET | Freq: Three times a day (TID) | ORAL | Status: DC
  Administered 2016-10-29 (×2): 25 mg via ORAL
  Filled 2016-10-29 (×2): qty 1

## 2016-10-29 MED ORDER — HYDRALAZINE HCL 50 MG PO TABS: 50.0000 mg | ORAL_TABLET | Freq: Three times a day (TID) | ORAL | 0 refills | Status: DC

## 2016-10-29 MED ORDER — HYDRALAZINE HCL 25 MG PO TABS: 25.0000 mg | ORAL_TABLET | Freq: Three times a day (TID) | ORAL | 0 refills | Status: DC

## 2016-10-29 MED ORDER — TRAMADOL-ACETAMINOPHEN 37.5-325 MG PO TABS: 1.0000 | ORAL_TABLET | Freq: Four times a day (QID) | ORAL | 0 refills | Status: DC | PRN

## 2016-10-29 MED ORDER — ENSURE ENLIVE PO LIQD: 237.0000 mL | Freq: Two times a day (BID) | ORAL | 12 refills | Status: DC

## 2016-10-29 MED ORDER — POLYETHYLENE GLYCOL 3350 17 G PO PACK: 17.0000 g | PACK | Freq: Every day | ORAL | 0 refills | Status: DC | PRN

## 2016-10-29 MED ORDER — POTASSIUM CHLORIDE CRYS ER 20 MEQ PO TBCR
40.0000 meq | EXTENDED_RELEASE_TABLET | Freq: Once | ORAL | Status: AC
  Administered 2016-10-29: 40 meq via ORAL
  Filled 2016-10-29: qty 2

## 2016-10-29 MED ORDER — VITAMIN B-12 1000 MCG PO TABS: 1000.0000 ug | ORAL_TABLET | Freq: Every day | ORAL | 0 refills | Status: DC

## 2016-10-29 NOTE — Care Management Note (Signed)
Case Management Note  Patient Details  Name: Calbert Hulsebus MRN: [id number] Date of Birth: November 10, 1960  Subjective/Objective:                 Patient with order to DC to SNF. DC to SNF facilitated by CSW.    Action/Plan:   Expected Discharge Date:  10/29/16               Expected Discharge Plan:  Skilled Nursing Facility  In-House Referral:  Clinical Social Work  Discharge planning Services  CM Consult  Post Acute Care Choice:    Choice offered to:     DME Arranged:    DME Agency:     HH Arranged:    [place] Agency:     Status of Service:  Completed, signed off  If discussed at [employer], dates discussed:    Additional Comments:  Carles Collet, RN 10/29/2016, 1:07 PM

## 2016-10-29 NOTE — Progress Notes (Signed)
Patient will DC to: [facility]  Anticipated DC date: 10/29/16 Family notified: N/A Transport by: Corey Harold   Per MD patient ready for DC to [facility]. RN, patient, patient's family, and facility notified of DC. Discharge Summary sent to facility. RN given number for report. DC packet on chart. Ambulance transport requested for patient.   CSW signing off.  Cedric Fishman, [facility] Social Worker [phone]

## 2016-10-29 NOTE — Clinical Social Work Note (Signed)
Clinical Social Work Assessment  Patient Details  Name: Javier Rivera MRN: [id number] Date of Birth: 12/26/1960  Date of referral:  10/29/16               Reason for consult:  Facility Placement                Permission sought to share information with:  Facility Sport and exercise psychologist, Family Supports Permission granted to share information::  Yes, Verbal Permission Granted  Name::        Agency::  SNFs  Relationship::     Contact Information:     Housing/Transportation Living arrangements for the past 2 months:  Homeless Source of Information:  Patient Patient Interpreter Needed:  None Criminal Activity/Legal Involvement Pertinent to Current Situation/Hospitalization:  No - Comment as needed Significant Relationships:  Siblings Lives with:  Self Do you feel safe going back to the place where you live?  No Need for family participation in patient care:  No (Coment)  Care giving concerns:  CSW received consult for possible SNF placement at time of discharge. CSW spoke with patient regarding PT recommendation of SNF placement at time of discharge. Patient reported that he is homeless and has been staying [place] a bridge. He states he does not take his medicines. His sister checks on him weekly. Last year, patient went to [place]. Patient expressed understanding of PT recommendation and is agreeable to SNF placement at time of discharge. CSW to continue to follow and assist with discharge planning needs.   Social Worker assessment / plan:  CSW spoke with patient concerning possibility of rehab at [facility] before returning home.  Employment status:  Other (Comment) Insurance information:  Self Pay (Medicaid Pending) PT Recommendations:  [place] / Referral to community resources:  [place]  Patient/Family's Response to care:  Patient recognizes need for rehab before returning home and is agreeable to a SNF. CSW explained lack of insurance  barriers to placement.   Patient/Family's Understanding of and Emotional Response to Diagnosis, Current Treatment, and Prognosis:  Patient/family is realistic regarding therapy needs and expressed being hopeful for SNF placement. Patient reports being tired and aware that he needs to take his Diabetes medication. Patient expressed understanding of CSW role and discharge process. No questions/concerns about plan or treatment.    Emotional Assessment Appearance:  Appears stated age Attitude/Demeanor/Rapport:  Other (Appropriate) Affect (typically observed):  Accepting, Appropriate Orientation:  Oriented to Self, Oriented to Place, Oriented to  Time, Oriented to Situation Alcohol / Substance use:  Illicit Drugs Psych involvement (Current and /or in the community):  No (Comment)  Discharge Needs  Concerns to be addressed:  Care Coordination Readmission within the last 30 days:  No Current discharge risk:  None Barriers to Discharge:  No Barriers Identified   Benard Halsted, LCSWA 10/29/2016, 1:50 PM

## 2016-10-29 NOTE — Discharge Summary (Addendum)
Physician Discharge Summary  Javier Rivera PJA:[id number] DOB: 29-Jun-1961 DOA: 10/27/2016  PCP: No PCP Per Patient  Admit date: 10/27/2016 Discharge date: 10/29/2016  Admitted From: home Disposition:  SNF  Recommendations for Outpatient Follow-up:  Follow up with MD at SNF in 1 week Patient will need CT angiogram of the chest or MRA every 6 months to evaluate ascending thoracic aortic aneurysm. Also needs referral to cardiothoracic surgery as outpatient.  Equipment/Devices: per PT in 1 week  Discharge Condition:  CODE STATUS: full code Diet recommendation: low sodium    Discharge Diagnoses:  Principal Problem:   Weakness, generalised  Active Problems:     FTT (failure to thrive) in adult   Right hip pain   Vitamin B12 Deficiency   Diabetes mellitus without complication (HCC)   Hyperlipidemia   Essential hypertension, uncontrolled   Tobacco abuse   Protein-calorie malnutrition, severe (HCC)   Ascending aorta dilatation (HCC)   Cerebral infarction [facility])   Thoracic aortic aneurysm without rupture (HCC)   Chest pain   Fall   Constipation    Brief narrative 56 y.o.male who is homeless and with hypertension, hyperlipidemia, stroke, ascending thoracic aortic aneurysm, tobacco abuse, cocaine abuse, who presented with weakness, fall, right hip pain.  ED Course:pt was found tohave positive orthostatic vital signs, negative troponin, WBC 3.7, urinalysis negative, electrolytes renal function okay, temperature normal, no tachycardia, oxygen saturation 96% on room air. Blood pressure was elevated 183/135. Placed on telemetry.  Hospital course Generalized Weakness - Symptoms for almost >1 year - On exam seemed to have right-sided weakness - Not sure if this is sequela from previous stroke or new issue. TSH and B12 normal. B12 low normal (279). Started supplement. - no stroke on MRI Seen by PT and recommend SNF.  DM-II - A1c of 6.6. Resumed glipizide.  HLD - Last LDL  was 115 on 10/19/15. Not taking home Lipitor - Resumed home medications: Lipitor  Hypokalemia Replenished  Uncontrolled hypertension Continue amlodipine. Added scheduled hydralazine.  Tobacco abuse - Counseled on smoking cessation. Placed nicotine patch.   Protein-calorie malnutrition, severe:  Added ensure  Chest pain -Reported occasional chest pain symptoms. Troponin negative. No EKG changes.  Constipation Added sennakot  Fall:  - Likely multifactorial etiology, including history of stroke, generalized weakness, orthostasis and dehydration. Head CT and MRI brain negative. -Seen by PT/OT and recommended SNF.  Hx of stroke - Continue ASA and lipitor  Ascending aorta dilatation, Thoracic aortic aneurysm without rupture and AAA - CTangiogram showed aneurysmal dilatation of the ascending aorta up to 4.7 cm and mild aneurysmal dilatation of the infrarenal aorta to 3.0 cm. No CTA evidence for acute aortic dissection. -Recommend semiannual imaging follow up  by CT aorta MRA and refer  to cardiothoracic surgery.  Right hip pain - No fracture by x-ray. CPK normal. - discharged on as needed ultracet. PT follow up at facility.   Family Communication: none at bedside Disposition Plan: SNF  Consultants:   None  Procedures:   None  Antimicrobials:   None   Discharge Instructions   Allergies as of 10/29/2016      Reactions   Clonidine Derivatives    Pt reported the sweats and shakes   Lisinopril Palpitations   "made me real sick, made my heart rate scatter"      Medication List    STOP taking these medications   gabapentin 100 MG capsule Commonly known as:  NEURONTIN     TAKE these medications   amLODipine 10 MG  tablet Commonly known as:  NORVASC Take 1 tablet (10 mg total) by mouth daily.   aspirin EC 81 MG tablet Take 1 tablet (81 mg total) by mouth daily.   atorvastatin 40 MG tablet Commonly known as:  LIPITOR Take 1 tablet (40 mg  total) by mouth daily at 6 PM.  Vitamin B12 1000 MCG tablet Commonly known as: Cyanocobalamin Take 1 tablet (1000 MCG total) by mouth daily   feeding supplement (ENSURE ENLIVE) Liqd Take 237 mLs by mouth 2 (two) times daily between meals.   glipiZIDE 10 MG tablet Commonly known as:  GLUCOTROL Take 1 tablet (10 mg total) by mouth daily before breakfast.   hydrALAZINE 50 MG tablet Commonly known as:  APRESOLINE Take 1 tablet (50 mg total) by mouth every 8 (eight) hours.   nicotine 21 mg/24hr patch Commonly known as:  NICODERM CQ - dosed in mg/24 hours Place 1 patch (21 mg total) onto the skin daily. Start taking on:  10/30/2016   ondansetron 4 MG tablet Commonly known as:  ZOFRAN Take 1 tablet (4 mg total) by mouth every 8 (eight) hours as needed for nausea or vomiting.   polyethylene glycol packet Commonly known as:  MIRALAX / GLYCOLAX Take 17 g by mouth daily as needed for moderate constipation.   senna-docusate 8.6-50 MG tablet Commonly known as:  Senokot-S Take 1 tablet by mouth 2 (two) times daily.   traMADol-acetaminophen 37.5-325 MG tablet Commonly known as:  ULTRACET Take 1 tablet by mouth every 6 (six) hours as needed.      Follow-up Information    MD at SNF in 1 week Follow up.          Allergies  Allergen Reactions  . Clonidine Derivatives     Pt reported the sweats and shakes  . Lisinopril Palpitations    "made me real sick, made my heart rate scatter"        Procedures/Studies: Ct Head Wo Contrast  Result Date: 10/27/2016 CLINICAL DATA:  Weakness and recent falls EXAM: CT HEAD WITHOUT CONTRAST TECHNIQUE: Contiguous axial images were obtained from the base of the skull through the vertex without intravenous contrast. COMPARISON:  03/29/2016 FINDINGS: Brain: Mild atrophic and white matter ischemic changes are noted. No findings to suggest acute hemorrhage, acute infarction or space-occupying mass lesion are noted. Vascular: No hyperdense vessel or  unexpected calcification. Skull: Normal. Negative for fracture or focal lesion. Sinuses/Orbits: No acute finding. Other: None. IMPRESSION: Mild atrophic and ischemic changes without acute abnormality. Electronically Signed   By: Inez Catalina M.D.   On: 10/27/2016 14:49   Mr Brain Wo Contrast  Result Date: 10/28/2016 CLINICAL DATA:  Right-sided weakness EXAM: MRI HEAD WITHOUT CONTRAST TECHNIQUE: Multiplanar, multiecho pulse sequences of the brain and surrounding structures were obtained without intravenous contrast. COMPARISON:  Head CT 10/27/2016 Brain MRI 10/17/2015 FINDINGS: Brain: No focal diffusion restriction to indicate acute infarct. No intraparenchymal hemorrhage. There is beginning confluent hyperintense T2-weighted signal within the periventricular, deep and subcortical white matter, most often seen in the setting of chronic microvascular ischemia. No mass lesion or midline shift. No hydrocephalus or extra-axial fluid collection. The midline structures are normal. No age advanced or lobar predominant atrophy. Vascular: Major intracranial arterial and venous sinus flow voids are preserved. No evidence of chronic microhemorrhage or amyloid angiopathy. Skull and upper cervical spine: The visualized skull base, calvarium, upper cervical spine and extracranial soft tissues are normal. Sinuses/Orbits: No fluid levels or advanced mucosal thickening. No mastoid effusion. Normal orbits.  IMPRESSION: 1. No acute intracranial abnormality. 2. 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 Electronically Signed   By: Ulyses Jarred M.D.   On: 10/28/2016 06:52   Ct Angio Chest/abd/pel For Dissection W And/or Wo Contrast  Result Date: 10/27/2016 CLINICAL DATA:  Initial evaluation for acute weakness, tachycardia, or right groin/ thigh/ hip pain. EXAM: CT ANGIOGRAPHY CHEST, ABDOMEN AND PELVIS TECHNIQUE: Multidetector CT imaging through the chest, abdomen and pelvis was performed using the standard protocol  during bolus administration of intravenous contrast. Multiplanar reconstructed images and MIPs were obtained and reviewed to evaluate the vascular anatomy. CONTRAST:  100 cc of Isovue 370. COMPARISON:  Prior radiograph from 03/29/2016. FINDINGS: CTA CHEST FINDINGS Cardiovascular: Precontrast imaging through the chest demonstrate scattered atheromatous plaque within the ascending aorta and aortic arch. No mural thrombus or other complication. Postcontrast imaging demonstrates no evidence for acute aortic dissection. Ascending aorta dilated up to 4.7 cm in maximal diameter (series 5, image 54). Minimal plaque about the origin of the left subclavian artery without stenosis. Partially visualized great vessels widely patent and within normal limits. Heart size within normal limits. Scattered 3 vessel coronary artery calcifications. No pericardial effusion. Limited evaluation of the pulmonary arteries grossly unremarkable. Mediastinum/Nodes: Thyroid normal. No pathologically enlarged mediastinal, hilar, or axillary lymph nodes. Esophagus within normal limits. Lungs/Pleura: Mild deep tendon atelectasis within the lungs. No focal infiltrates. No pulmonary edema or pleural effusion. No pneumothorax. No worrisome pulmonary nodule or mass. Musculoskeletal: No acute osseous abnormality. No worrisome lytic or blastic osseous lesions. Review of the MIP images confirms the above findings. CTA ABDOMEN AND PELVIS FINDINGS VASCULAR Aorta: No evidence for acute dissection or other abnormality within the intra-abdominal aorta. Infrarenal aorta dilated 3.0 cm. Mild to moderate calcified and noncalcified plaque throughout the aorta itself. Celiac: Celiac axis and its branch vessels widely patent. SMA: SMA widely patent. Renals: Single renal arteries present bilaterally, both widely patent. IMA: IMA patent. Inflow: Extensive atheromatous plaque about the common iliac arteries bilaterally just distal to the bifurcation. Small focal  penetrating atheromatous ulcer noted at the proximal right common iliac artery (series 5, image 158). Common iliac arteries ectatic measuring up to 14 mm on the right and 16 mm on the left. Moderate stenosis at the origin of the right internal iliac artery of approximately 50-60% (series 5, image 174). More mild to moderate narrowing at the origin of the left internal iliac artery. Extensive atheromatous plaque throughout the internal iliac vessels bilaterally. Right external iliac artery patent. Proximal femoral arteries within the thigh widely patent without abnormality. Left external iliac artery demonstrates extensive atheromatous plaque without significant stenosis. Visualized proximal vessels within the left thigh patent without high-grade stenosis or other acute abnormality. Veins: No venous abnormality. Review of the MIP images confirms the above findings. NON-VASCULAR Hepatobiliary: 4.5 cm hypodense lesion with peripheral nodular enhancement within the left hepatic lobe most compatible with a benign hemangioma. Liver otherwise unremarkable. Gallbladder within normal limits. No biliary dilatation. Pancreas: Pancreas within normal limits. Spleen: Spleen within normal limits. Adrenals/Urinary Tract: Approximate 2 cm hyperdense lesion within the left adrenal gland noted. Additional 3.4 cm hypodense right adrenal nodule. These are similar to previous, and most consistent with probable small adenomas. Kidneys equal in size with symmetric enhancement. Scattered bilateral renal cysts present, largest of which positioned at the lower pole the left kidney and measures 5.4 cm. No nephrolithiasis or hydronephrosis. No focal enhancing renal mass. No hydroureter. Bladder within normal limits. Stomach/Bowel: Stomach within normal limits. No evidence for bowel  obstruction. Appendix normal. Large amount of retained stool diffusely throughout the colon, suggesting constipation. No acute inflammatory changes seen about the  bowels. Lymphatic: No pathologically enlarged intra-abdominal or pelvic lymph nodes. Reproductive: Prostate normal. Other: No free air or fluid. Musculoskeletal: No acute osseous abnormality. No worrisome lytic or blastic osseous lesions. Review of the MIP images confirms the above findings. IMPRESSION: 1. No CTA evidence for acute aortic dissection or other acute vascular abnormality. 2. Aneurysmal dilatation of the ascending aorta up to 4.7 cm. Recommend semi-annual imaging followup by CTA or MRA and referral to cardiothoracic surgery if not already obtained. This recommendation follows 2010 ACCF/AHA/AATS/ACR/ASA/SCA/SCAI/SIR/STS/SVM Guidelines for the Diagnosis and Management of Patients With Thoracic Aortic Disease. Circulation. 2010; 121: e266-e36. 3. Mild aneurysmal dilatation of the infrarenal aorta to 3.0 cm. Recommend followup by ultrasound in 3 years. This recommendation follows ACR consensus guidelines: White Paper of the ACR Incidental Findings Committee II on Vascular Findings. J Am Coll Radiol 2013; 95:621-308 4. Moderate atheromatous plaque throughout the intra-abdominal aorta and bilateral iliac arteries as above, with diffuse 3 vessel coronary artery calcifications. 5. No other acute abnormality within the chest, abdomen, and pelvis. 6. Large amount of retained stool within the colon, suggesting constipation. 7. 4.5 cm benign hepatic hemangioma. Electronically Signed   By: Jeannine Boga M.D.   On: 10/27/2016 17:47   Dg Hip Unilat W Or Wo Pelvis 2-3 Views Right  Result Date: 10/27/2016 CLINICAL DATA:  Right hip pain.  Possible fall. EXAM: DG HIP (WITH OR WITHOUT PELVIS) 2-3V RIGHT COMPARISON:  None. FINDINGS: Minimal right femoral head and neck junction spur formation. Mild lower lumbar spine degenerative changes. No fracture or dislocation. IMPRESSION: Minimal right hip degenerative changes and mild lower lumbar spine degenerative changes. Electronically Signed   By: Claudie Revering M.D.    On: 10/27/2016 14:09       Subjective: Reports feeling better but still has some weakness.  Discharge Exam: Vitals:   10/28/16 2026 10/29/16 0443  BP: (!) 175/91 (!) 169/92  Pulse: 88 89  Resp: 19 18  Temp: 98.5 F (36.9 C) 98.2 F (36.8 C)   Vitals:   10/28/16 0848 10/28/16 1400 10/28/16 2026 10/29/16 0443  BP: (!) 159/86 (!) 144/73 (!) 175/91 (!) 169/92  Pulse: (!) 101 (!) 102 88 89  Resp:  18 19 18   Temp:  98 F (36.7 C) 98.5 F (36.9 C) 98.2 F (36.8 C)  TempSrc:  Oral  Oral  SpO2: 100% 99% 99% 97%  Weight:      Height:        General: Not in distress HEENT: No pallor, moist mucosa, supple neck Chest: Clear bilaterally CVS: Normal S1 and S2, no murmurs rub or gallop GI: Soft, nondistended, nontender Musculoskeletal: Warm, no edema CNS: Alert and oriented     The results of significant diagnostics from this hospitalization (including imaging, microbiology, ancillary and laboratory) are listed below for reference.     Microbiology: No results found for this or any previous visit (from the past 240 hour(s)).   Labs: BNP (last 3 results) No results for input(s): BNP in the last 8760 hours. Basic Metabolic Panel:  Recent Labs Lab 10/27/16 1410 10/28/16 0508 10/29/16 0431  NA 137 139 139  K 3.5 3.1* 3.5  CL 102 105 104  CO2 26 25 27   GLUCOSE 116* 91 102*  BUN 13 8 10   CREATININE 0.95 0.97 1.13  CALCIUM 9.0 8.4* 8.4*  MG  --   --  1.9  Liver Function Tests: No results for input(s): AST, ALT, ALKPHOS, BILITOT, PROT, ALBUMIN in the last 168 hours. No results for input(s): LIPASE, AMYLASE in the last 168 hours. No results for input(s): AMMONIA in the last 168 hours. CBC:  Recent Labs Lab 10/27/16 1410 10/28/16 0508 10/29/16 0431  WBC 3.7* 3.5* 3.4*  NEUTROABS 2.0  --   --   HGB 16.6 15.2 13.8  HCT 48.3 44.8 41.6  MCV 87.3 87.7 88.5  PLT 211 196 184   Cardiac Enzymes:  Recent Labs Lab 10/27/16 2329 10/28/16 0508 10/28/16 1045   CKTOTAL 258  --   --   TROPONINI <0.03 <0.03 <0.03   BNP: Invalid input(s): POCBNP CBG:  Recent Labs Lab 10/28/16 1214 10/28/16 1726 10/28/16 2024 10/29/16 0825 10/29/16 1218  GLUCAP 129* 103* 114* 103* 102*   D-Dimer No results for input(s): DDIMER in the last 72 hours. Hgb A1c  Recent Labs  10/28/16 0508  HGBA1C 6.6*   Lipid Profile  Recent Labs  10/28/16 0508  CHOL 164  HDL 41  LDLCALC 110*  TRIG 67  CHOLHDL 4.0   Thyroid function studies  Recent Labs  10/28/16 0508  TSH 2.273   Anemia work up  Recent Labs  10/28/16 0508  VITAMINB12 279   Urinalysis    Component Value Date/Time   COLORURINE YELLOW 10/27/2016 [place] 10/27/2016 1540   LABSPEC 1.021 10/27/2016 1540   PHURINE 6.0 10/27/2016 1540   GLUCOSEU 50 (A) 10/27/2016 1540   HGBUR NEGATIVE 10/27/2016 1540   BILIRUBINUR NEGATIVE 10/27/2016 1540   KETONESUR NEGATIVE 10/27/2016 1540   PROTEINUR 30 (A) 10/27/2016 1540   UROBILINOGEN 2.0 (H) 04/23/2015 0843   NITRITE NEGATIVE 10/27/2016 1540   LEUKOCYTESUR NEGATIVE 10/27/2016 1540   Sepsis Labs Invalid input(s): PROCALCITONIN,  WBC,  LACTICIDVEN Microbiology No results found for this or any previous visit (from the past 240 hour(s)).   Time coordinating discharge: Over 30 minutes  SIGNED:   Louellen Molder, MD  [facility] 10/29/2016, 12:20 PM Pager   If 7PM-7AM, please contact night-coverage [email or web address] Password [email or web address]

## 2016-10-29 NOTE — Clinical Social Work Placement (Signed)
   CLINICAL SOCIAL WORK PLACEMENT  NOTE  Date:  10/29/2016  Patient Details  Name: Javier Rivera MRN: [id number] Date of Birth: 11/03/1960  Clinical Social Work is seeking post-discharge placement for this patient at the [place] level of care (*CSW will initial, date and re-position this form in  chart as items are completed):  Yes   Patient/family provided with [facility] Work Department's list of facilities offering this level of care within the geographic area requested by the patient (or if unable, by the patient's family).  Yes   Patient/family informed of their freedom to choose among providers that offer the needed level of care, that participate in Medicare, Medicaid or managed care program needed by the patient, have an available bed and are willing to accept the patient.  Yes   Patient/family informed of [facility]'s ownership interest in [facility] and [facility], as well as of the fact that they are under no obligation to receive care at these facilities.  PASRR submitted to EDS on 10/29/16     PASRR number received on 10/29/16     Existing PASRR number confirmed on       FL2 transmitted to all facilities in geographic area requested by pt/family on 10/29/16     FL2 transmitted to all facilities within larger geographic area on       Patient informed that his/her managed care company has contracts with or will negotiate with certain facilities, including the following:        Yes   Patient/family informed of bed offers received.  Patient chooses bed at [facility]     Physician recommends and patient chooses bed at      Patient to be transferred to [facility] on 10/29/16.  Patient to be transferred to facility by [facility]     Patient family notified on 10/29/16 of transfer.  Name of family member notified:  N/A     PHYSICIAN       Additional Comment:     _______________________________________________ Benard Halsted, [place] 10/29/2016, 1:59 PM

## 2016-11-01 ENCOUNTER — Encounter: Payer: Self-pay | Admitting: Adult Health

## 2016-11-01 ENCOUNTER — Non-Acute Institutional Stay (SKILLED_NURSING_FACILITY): Payer: Self-pay | Admitting: Adult Health

## 2016-11-01 DIAGNOSIS — Z720 Tobacco use: Secondary | ICD-10-CM

## 2016-11-01 DIAGNOSIS — I712 Thoracic aortic aneurysm, without rupture, unspecified: Secondary | ICD-10-CM

## 2016-11-01 DIAGNOSIS — E1140 Type 2 diabetes mellitus with diabetic neuropathy, unspecified: Secondary | ICD-10-CM

## 2016-11-01 DIAGNOSIS — E43 Unspecified severe protein-calorie malnutrition: Secondary | ICD-10-CM

## 2016-11-01 DIAGNOSIS — I10 Essential (primary) hypertension: Secondary | ICD-10-CM

## 2016-11-01 DIAGNOSIS — E785 Hyperlipidemia, unspecified: Secondary | ICD-10-CM

## 2016-11-01 DIAGNOSIS — K5901 Slow transit constipation: Secondary | ICD-10-CM

## 2016-11-01 DIAGNOSIS — I6302 Cerebral infarction due to thrombosis of basilar artery: Secondary | ICD-10-CM

## 2016-11-01 DIAGNOSIS — R627 Adult failure to thrive: Secondary | ICD-10-CM

## 2016-11-01 NOTE — Progress Notes (Signed)
Location:   [facility] Room Number: 128 A Place of Service:  SNF (31)   CODE STATUS: Full Code  Allergies  Allergen Reactions  . Clonidine Derivatives     Pt reported the sweats and shakes  . Lisinopril Palpitations    "made me real sick, made my heart rate scatter"    Chief Complaint  Patient presents with  . Hospitalization Follow-up    Hospital Follow up    HPI:  He has been hospitalized for increased weakness; deconditioning; protein calori malnutrition and fall. He is here for short term rehab; with his goal to return to home. He is not voicing any complaints at this time. There are no nursing concerns at this time.    Past Medical History:  Diagnosis Date  . Diabetes mellitus without complication ([place])   . Hypertension   . Renal disorder    pt stated that masses were found on his kidneys  . Renal insufficiency   . Stroke [facility])     Past Surgical History:  Procedure Laterality Date  . KNEE SURGERY     bilateral    Social History   Social History  . Marital status: Single    Spouse name: N/A  . Number of children: N/A  . Years of education: N/A   Occupational History  . Not on file.   Social History Main Topics  . Smoking status: Light Tobacco Smoker    Packs/day: 0.25    Years: 20.00    Types: Cigarettes  . Smokeless tobacco: Never Used  . Alcohol use No  . 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 Sexual activity: Not on file   Other Topics Concern  . Not on file   Social History Narrative  . No narrative on file   Family History  Problem Relation Age of Onset  . Diabetes Mellitus II Mother   . Hypertension Mother   . Diabetes Mellitus II Sister   . Hypertension Sister       VITAL SIGNS BP (!) 174/94   Pulse 90   Temp 97.9 F (36.6 C)   Resp 19   Ht 6\' 9"  (2.057 m)   Wt 201 lb 15 oz (91.6 kg)   SpO2 98%   BMI 21.64 kg/m   Patient's Medications  New Prescriptions   No medications on  file  Previous Medications   AMLODIPINE (NORVASC) 10 MG TABLET    Take 1 tablet (10 mg total) by mouth daily.   ASPIRIN EC 81 MG TABLET    Take 1 tablet (81 mg total) by mouth daily.   ATORVASTATIN (LIPITOR) 40 MG TABLET    Take 1 tablet (40 mg total) by mouth daily at 6 PM.   GLIPIZIDE (GLUCOTROL) 10 MG TABLET    Take 1 tablet (10 mg total) by mouth daily before breakfast.   HYDRALAZINE (APRESOLINE) 50 MG TABLET    Take 1 tablet (50 mg total) by mouth every 8 (eight) hours.   NICOTINE (NICODERM CQ - DOSED IN MG/24 HOURS) 21 MG/24HR PATCH    Place 1 patch (21 mg total) onto the skin daily.   ONDANSETRON (ZOFRAN) 4 MG TABLET    Take 1 tablet (4 mg total) by mouth every 8 (eight) hours as needed for nausea or vomiting.   POLYETHYLENE GLYCOL (MIRALAX / GLYCOLAX) PACKET    Take 17 g by mouth daily as needed for moderate constipation.   SENNA-DOCUSATE (SENOKOT-S)  8.6-50 MG TABLET    Take 1 tablet by mouth 2 (two) times daily.   TRAMADOL-ACETAMINOPHEN (ULTRACET) 37.5-325 MG TABLET    Take 1 tablet by mouth every 6 (six) hours as needed.   VITAMIN B-12 (CYANOCOBALAMIN) 1000 MCG TABLET    Take 1 tablet (1,000 mcg total) by mouth daily.  Modified Medications   No medications on file  Discontinued Medications   FEEDING SUPPLEMENT, ENSURE ENLIVE, (ENSURE ENLIVE) LIQD    Take 237 mLs by mouth 2 (two) times daily between meals.     SIGNIFICANT DIAGNOSTIC EXAMS  10-27-16: right hip x-ray: Minimal right hip degenerative changes and mild lower lumbar spine degenerative changes.  10-27-16: ct of head: Mild atrophic and ischemic changes without acute abnormality.   10-27-16: ct angio of chest; abdomen and pelvis: 1. No CTA evidence for acute aortic dissection or other acute vascular abnormality. 2. Aneurysmal dilatation of the ascending aorta up to 4.7 cm. 3. Mild aneurysmal dilatation of the infrarenal aorta to 3.0 cm. 4. Moderate atheromatous plaque throughout the intra-abdominal aorta and bilateral iliac  arteries as above, with diffuse 3 vessel coronary artery calcifications. 5. No other acute abnormality within the chest, abdomen, and pelvis. 6. Large amount of retained stool within the colon, suggesting constipation. 7. 4.5 cm benign hepatic hemangioma.  10-28-16: mri of head: 1. No acute intracranial abnormality. 2. 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   LABS REVIEWED:   10-27-16: wbc 3.7; hgb 16.6; hct 48.3; mcv 87.3; plt 211; glucose 116; bun 13; creat 0.95; k+ 3.5 ;na++ 137; HIV: nr 10-28-16: wbc 3.5; hgb 15.2; hct 44.8; mcv 87.7; plt 196; glucose 91; bun 8; creat 0.97; k+ 3.1; na++ 139; vit B 12: 279; hgb a1c 6.6; tsh 2.273; chol 164; ldl 110; trig 67; hdl 41 10-29-16: wbc 3.4; hgb 13.8; hct 41.6; mcv 88.5; plt 184; glucose 102; bun 10; creat 1.13; k+ 3.5; na++ 139; mag 1.9    Review of Systems  Constitutional: Negative for malaise/fatigue.  Respiratory: Negative for cough and shortness of breath.   Cardiovascular: Negative for chest pain, palpitations and leg swelling.  Gastrointestinal: Negative for abdominal pain, constipation and heartburn.  Musculoskeletal: Negative for back pain, joint pain and myalgias.  Skin: Negative.   Neurological: Negative for dizziness.  Psychiatric/Behavioral: The patient is not nervous/anxious.     Physical Exam  Constitutional: No distress.  Eyes: Conjunctivae are normal.  Neck: Neck supple. No JVD present. No thyromegaly present.  Cardiovascular: Normal rate, regular rhythm and intact distal pulses.   Respiratory: Effort normal and breath sounds normal. No respiratory distress. He has no wheezes.  GI: Soft. Bowel sounds are normal. He exhibits no distension. There is no tenderness.  Musculoskeletal: He exhibits no edema.  Able to move all extremities  Has lower extremity weakness present   Lymphadenopathy:    He has no cervical adenopathy.  Neurological: He is alert.  Skin: Skin is warm and dry. He is not diaphoretic.    Psychiatric: He has a normal mood and affect.     ASSESSMENT/ PLAN:  1. Hypertension: will continue asa 81 mg daily norvasc 10 mg daily will increase hydralazine to 75 mg four times daily and will monitor his status.   2. Dyslipidemia: ldl 110; will continue lipitor 40 mg daily  3. Diabetes: hgb a1c 6.6; will continue glucotrol 10 mg daily   4. Tobacco use: is using a nicotine patch 21 mg daily   5. Constipation: will continue senna s twice daily and miralax  daily as needed  6. CVA: is neurologically stable will continue asa 81 mg daily  7. Failure to thrive and protein calorie malnutrition: will continue supplements per facility protocol   8. thoracic aortic aneurysm with rupture: will monitor    Time spent with patient  50  minutes >50% time spent counseling; reviewing medical record; tests; labs; and developing future plan of care   MD is aware of resident's narcotic use and is in agreement with current plan of care. We will attempt to wean resident as apropriate   Ok Edwards NP [facility]  Contact [phone] Monday through Friday 8am- 5pm  After hours call [phone]

## 2016-11-02 ENCOUNTER — Encounter: Payer: Self-pay | Admitting: Internal Medicine

## 2016-11-02 ENCOUNTER — Non-Acute Institutional Stay (SKILLED_NURSING_FACILITY): Payer: Self-pay | Admitting: Internal Medicine

## 2016-11-02 DIAGNOSIS — I10 Essential (primary) hypertension: Secondary | ICD-10-CM

## 2016-11-02 DIAGNOSIS — E785 Hyperlipidemia, unspecified: Secondary | ICD-10-CM

## 2016-11-02 DIAGNOSIS — I160 Hypertensive urgency: Secondary | ICD-10-CM

## 2016-11-02 DIAGNOSIS — E1140 Type 2 diabetes mellitus with diabetic neuropathy, unspecified: Secondary | ICD-10-CM

## 2016-11-02 DIAGNOSIS — R531 Weakness: Secondary | ICD-10-CM

## 2016-11-02 DIAGNOSIS — I77810 Thoracic aortic ectasia: Secondary | ICD-10-CM

## 2016-11-02 DIAGNOSIS — K5901 Slow transit constipation: Secondary | ICD-10-CM

## 2016-11-02 DIAGNOSIS — M25551 Pain in right hip: Secondary | ICD-10-CM

## 2016-11-02 NOTE — Progress Notes (Signed)
Provider:  Veleta Miners Location:   [place] Room Number: 128/A Place of Service:  SNF (31)  PCP: No PCP Per Patient Patient Care Team: No Pcp Per Patient as PCP - General (General Practice)  Extended Emergency Contact Information Primary Emergency Contact: Devona Konig [place] of [place] Phone: [phone] Relation: Friend Secondary Emergency Contact: Casimiro Needle [place] of [place] Mobile Phone: [phone] Relation: Sister  Code Status: DNR Goals of Care: Advanced Directive information Advanced Directives 11/02/2016  Does Patient Have a Medical Advance Directive? Yes  Type of Advance Directive Out of facility DNR (pink MOST or yellow form)  Does patient want to make changes to medical advance directive? No - Patient declined  Copy of [place] in Chart? -  Would patient like information on creating a medical advance directive? No - Patient declined      Chief Complaint  Patient presents with  . New Admit To SNF    HPI: Patient is a 56 y.o. male seen today for admission to SNF for therapy. He has H/O Hypertension, Hyperlipidemia, Diabetes mellitus, Ascending Thoracic Aneurysm, H/O Cocaine and tobacco Abuse.and Cocaine abuse.  Patient was admitted to Hospital for Generalized weakness. It seems he was homeless and not taking his meds regularly. He presented with progressive weakness in his legs and numbness in both legs and arms. He got so weak that he couldn't walk. He says he has problems with his Gait. He was evaluated with Negative MRI. He did have low B12 and was started on supplement. 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 Patient BP manually today was elevated to 190/130 . D/w Nurse and it seems like he missed his Last night and this morning dose of Hydralazine. He looks very restless. He denied any Chest pain, Cough or SOB. No dizziness but  says he feels very weak when he stands up. Continue to feel weak on Right side with some muscle spasm and pain. He also has tingling sensation in both Hands and feet. He also told Therapist that he is depressed.  Past Medical History:  Diagnosis Date  . Diabetes mellitus without complication ([place])   . Hypertension   . Renal disorder    pt stated that masses were found on his kidneys  . Renal insufficiency   . Stroke [facility])    Past Surgical History:  Procedure Laterality Date  . KNEE SURGERY     bilateral    reports that he has been smoking Cigarettes.  He has a 5.00 pack-year smoking history. He has never used smokeless tobacco. He reports that he does not drink alcohol or use drugs. Social History   Social History  . Marital status: Single    Spouse name: N/A  . Number of children: N/A  . Years of education: N/A   Occupational History  . Not on file.   Social History Main Topics  . Smoking status: Light Tobacco Smoker    Packs/day: 0.25    Years: 20.00    Types: Cigarettes  . Smokeless tobacco: Never Used  . Alcohol use No  . 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 Sexual activity: Not on file   Other Topics Concern  . Not on file   Social History Narrative  . No narrative on file    Functional Status Survey:    Family History  Problem  Relation Age of Onset  . Diabetes Mellitus II Mother   . Hypertension Mother   . Diabetes Mellitus II Sister   . Hypertension Sister     Health Maintenance  Topic Date Due  . INFLUENZA VACCINE  11/01/2017 (Originally 03/08/2016)  . FOOT EXAM  11/01/2017 (Originally 09/13/1971)  . OPHTHALMOLOGY EXAM  11/01/2017 (Originally 09/13/1971)  . URINE MICROALBUMIN  11/01/2017 (Originally 09/13/1971)  . COLONOSCOPY  11/01/2017 (Originally 09/13/2011)  . Hepatitis C Screening  11/01/2017 (Originally February 20, 1961)  . HEMOGLOBIN A1C  04/30/2017  . TETANUS/TDAP  11/07/2018  . PNEUMOCOCCAL POLYSACCHARIDE VACCINE  Completed    . HIV Screening  Completed    Allergies  Allergen Reactions  . Clonidine Derivatives     Pt reported the sweats and shakes  . Lisinopril Palpitations    "made me real sick, made my heart rate scatter"    Allergies as of 11/02/2016      Reactions   Clonidine Derivatives    Pt reported the sweats and shakes   Lisinopril Palpitations   "made me real sick, made my heart rate scatter"      Medication List       Accurate as of 11/02/16 12:29 PM. Always use your most recent med list.          amLODipine 10 MG tablet Commonly known as:  NORVASC Take 1 tablet (10 mg total) by mouth daily.   aspirin EC 81 MG tablet Take 1 tablet (81 mg total) by mouth daily.   atorvastatin 40 MG tablet Commonly known as:  LIPITOR Take 1 tablet (40 mg total) by mouth daily at 6 PM.   ENSURE ENLIVE PO Give 1 unit by mouth two times a day for Dietary Supplement   glipiZIDE 10 MG tablet Commonly known as:  GLUCOTROL Take 1 tablet (10 mg total) by mouth daily before breakfast.   hydrALAZINE 50 MG tablet Commonly known as:  APRESOLINE Take 1 tablet (50 mg total) by mouth every 8 (eight) hours.   nicotine 21 mg/24hr patch Commonly known as:  NICODERM CQ - dosed in mg/24 hours Place 1 patch (21 mg total) onto the skin daily.   ondansetron 4 MG tablet Commonly known as:  ZOFRAN Take 1 tablet (4 mg total) by mouth every 8 (eight) hours as needed for nausea or vomiting.   polyethylene glycol packet Commonly known as:  MIRALAX / GLYCOLAX Take 17 g by mouth daily as needed for moderate constipation.   senna-docusate 8.6-50 MG tablet Commonly known as:  Senokot-S Take 1 tablet by mouth 2 (two) times daily.   traMADol-acetaminophen 37.5-325 MG tablet Commonly known as:  ULTRACET Take 1 tablet by mouth every 6 (six) hours as needed.   vitamin B-12 1000 MCG tablet Commonly known as:  CYANOCOBALAMIN Take 1 tablet (1,000 mcg total) by mouth daily.       Review of Systems   Constitutional: Positive for activity change and unexpected weight change. Negative for appetite change, chills, fatigue and fever.       He says he has lost almost 50-60 lbs  HENT: Negative.   Respiratory: Negative.   Cardiovascular: Negative.   Gastrointestinal: Positive for constipation. Negative for abdominal distention, abdominal pain, diarrhea, nausea and vomiting.  Genitourinary: Negative.   Musculoskeletal: Positive for back pain and myalgias. Negative for arthralgias and neck pain.  Skin: Negative.   Neurological: Positive for weakness. Negative for dizziness, light-headedness, numbness and headaches.  Psychiatric/Behavioral: Positive for confusion, decreased concentration, dysphoric mood and sleep disturbance.  The patient is nervous/anxious.     Vitals:   11/02/16 1228  BP: (!) 200/130  Pulse: 83  Resp: 19  Temp: 97.9 F (36.6 C)  SpO2: 98%   There is no height or weight on file to calculate BMI. Physical Exam  Constitutional: He is oriented to person, place, and time. He appears well-developed and well-nourished.  HENT:  Head: Normocephalic.  Mouth/Throat: Oropharynx is clear and moist.  Eyes: Pupils are equal, round, and reactive to light.  Neck: Neck supple.  Cardiovascular: Normal rate, regular rhythm and normal heart sounds.   No murmur heard. Pulmonary/Chest: Effort normal and breath sounds normal. No respiratory distress. He has no wheezes. He has no rales.  Abdominal: Soft. Bowel sounds are normal. He exhibits no distension. There is no tenderness. There is no rebound.  Neurological: He is alert and oriented to person, place, and time.  Good strength in UE Right LE 3/5 Left LE 4/5  Skin: Skin is warm and dry.    Labs reviewed: Basic Metabolic Panel:  Recent Labs  02/16/16 0720  10/27/16 1410 10/28/16 0508 10/29/16 0431  NA 140  < > 137 139 139  K 3.7  < > 3.5 3.1* 3.5  CL 105  < > 102 105 104  CO2 30  < > 26 25 27   GLUCOSE 95  < > 116* 91 102*   BUN 8  < > 13 8 10   CREATININE 1.15  < > 0.95 0.97 1.13  CALCIUM 8.9  < > 9.0 8.4* 8.4*  MG 1.8  --   --   --  1.9  < > = values in this interval not displayed. Liver Function Tests:  Recent Labs  02/26/16 1239 03/29/16 1005 03/31/16 1124  AST 63* 18 17  ALT 94* 15* 17  ALKPHOS 73 68 63  BILITOT 0.7 1.2 1.0  PROT 7.1 6.7 6.8  ALBUMIN 4.1 4.0 4.0    Recent Labs  02/26/16 1239 03/31/16 1124  LIPASE 15 20   No results for input(s): AMMONIA in the last 8760 hours. CBC:  Recent Labs  02/26/16 1239  03/29/16 1005  10/27/16 1410 10/28/16 0508 10/29/16 0431  WBC 4.1  < > 2.7*  < > 3.7* 3.5* 3.4*  NEUTROABS 2.5  --  1.4*  --  2.0  --   --   HGB 15.6  < > 15.6  < > 16.6 15.2 13.8  HCT 47.4  < > 47.3  < > 48.3 44.8 41.6  MCV 91.3  < > 90.4  < > 87.3 87.7 88.5  PLT 159  < > 134*  < > 211 196 184  < > = values in this interval not displayed. Cardiac Enzymes:  Recent Labs  10/27/16 2329 10/28/16 0508 10/28/16 1045  CKTOTAL 258  --   --   TROPONINI <0.03 <0.03 <0.03   BNP: Invalid input(s): POCBNP Lab Results  Component Value Date   HGBA1C 6.6 (H) 10/28/2016   Lab Results  Component Value Date   TSH 2.273 10/28/2016   Lab Results  Component Value Date   VITAMINB12 279 10/28/2016   Lab Results  Component Value Date   FOLATE 13.1 04/23/2015   No results found for: IRON, TIBC, FERRITIN  Imaging and Procedures obtained prior to SNF admission: Ct Head Wo Contrast  Result Date: 10/27/2016 CLINICAL DATA:  Weakness and recent falls EXAM: CT HEAD WITHOUT CONTRAST TECHNIQUE: Contiguous axial images were obtained from the base of the skull through the  vertex without intravenous contrast. COMPARISON:  03/29/2016 FINDINGS: Brain: Mild atrophic and white matter ischemic changes are noted. No findings to suggest acute hemorrhage, acute infarction or space-occupying mass lesion are noted. Vascular: No hyperdense vessel or unexpected calcification. Skull: Normal.  Negative for fracture or focal lesion. Sinuses/Orbits: No acute finding. Other: None. IMPRESSION: Mild atrophic and ischemic changes without acute abnormality. Electronically Signed   By: Inez Catalina M.D.   On: 10/27/2016 14:49   Mr Brain Wo Contrast  Result Date: 10/28/2016 CLINICAL DATA:  Right-sided weakness EXAM: MRI HEAD WITHOUT CONTRAST TECHNIQUE: Multiplanar, multiecho pulse sequences of the brain and surrounding structures were obtained without intravenous contrast. COMPARISON:  Head CT 10/27/2016 Brain MRI 10/17/2015 FINDINGS: Brain: No focal diffusion restriction to indicate acute infarct. No intraparenchymal hemorrhage. There is beginning confluent hyperintense T2-weighted signal within the periventricular, deep and subcortical white matter, most often seen in the setting of chronic microvascular ischemia. No mass lesion or midline shift. No hydrocephalus or extra-axial fluid collection. The midline structures are normal. No age advanced or lobar predominant atrophy. Vascular: Major intracranial arterial and venous sinus flow voids are preserved. No evidence of chronic microhemorrhage or amyloid angiopathy. Skull and upper cervical spine: The visualized skull base, calvarium, upper cervical spine and extracranial soft tissues are normal. Sinuses/Orbits: No fluid levels or advanced mucosal thickening. No mastoid effusion. Normal orbits. IMPRESSION: 1. No acute intracranial abnormality. 2. 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 Electronically Signed   By: Ulyses Jarred M.D.   On: 10/28/2016 06:52   Ct Angio Chest/abd/pel For Dissection W And/or Wo Contrast  Result Date: 10/27/2016 CLINICAL DATA:  Initial evaluation for acute weakness, tachycardia, or right groin/ thigh/ hip pain. EXAM: CT ANGIOGRAPHY CHEST, ABDOMEN AND PELVIS TECHNIQUE: Multidetector CT imaging through the chest, abdomen and pelvis was performed using the standard protocol during bolus administration of intravenous  contrast. Multiplanar reconstructed images and MIPs were obtained and reviewed to evaluate the vascular anatomy. CONTRAST:  100 cc of Isovue 370. COMPARISON:  Prior radiograph from 03/29/2016. FINDINGS: CTA CHEST FINDINGS Cardiovascular: Precontrast imaging through the chest demonstrate scattered atheromatous plaque within the ascending aorta and aortic arch. No mural thrombus or other complication. Postcontrast imaging demonstrates no evidence for acute aortic dissection. Ascending aorta dilated up to 4.7 cm in maximal diameter (series 5, image 54). Minimal plaque about the origin of the left subclavian artery without stenosis. Partially visualized great vessels widely patent and within normal limits. Heart size within normal limits. Scattered 3 vessel coronary artery calcifications. No pericardial effusion. Limited evaluation of the pulmonary arteries grossly unremarkable. Mediastinum/Nodes: Thyroid normal. No pathologically enlarged mediastinal, hilar, or axillary lymph nodes. Esophagus within normal limits. Lungs/Pleura: Mild deep tendon atelectasis within the lungs. No focal infiltrates. No pulmonary edema or pleural effusion. No pneumothorax. No worrisome pulmonary nodule or mass. Musculoskeletal: No acute osseous abnormality. No worrisome lytic or blastic osseous lesions. Review of the MIP images confirms the above findings. CTA ABDOMEN AND PELVIS FINDINGS VASCULAR Aorta: No evidence for acute dissection or other abnormality within the intra-abdominal aorta. Infrarenal aorta dilated 3.0 cm. Mild to moderate calcified and noncalcified plaque throughout the aorta itself. Celiac: Celiac axis and its branch vessels widely patent. SMA: SMA widely patent. Renals: Single renal arteries present bilaterally, both widely patent. IMA: IMA patent. Inflow: Extensive atheromatous plaque about the common iliac arteries bilaterally just distal to the bifurcation. Small focal penetrating atheromatous ulcer noted at the  proximal right common iliac artery (series 5, image 158). Common iliac arteries  ectatic measuring up to 14 mm on the right and 16 mm on the left. Moderate stenosis at the origin of the right internal iliac artery of approximately 50-60% (series 5, image 174). More mild to moderate narrowing at the origin of the left internal iliac artery. Extensive atheromatous plaque throughout the internal iliac vessels bilaterally. Right external iliac artery patent. Proximal femoral arteries within the thigh widely patent without abnormality. Left external iliac artery demonstrates extensive atheromatous plaque without significant stenosis. Visualized proximal vessels within the left thigh patent without high-grade stenosis or other acute abnormality. Veins: No venous abnormality. Review of the MIP images confirms the above findings. NON-VASCULAR Hepatobiliary: 4.5 cm hypodense lesion with peripheral nodular enhancement within the left hepatic lobe most compatible with a benign hemangioma. Liver otherwise unremarkable. Gallbladder within normal limits. No biliary dilatation. Pancreas: Pancreas within normal limits. Spleen: Spleen within normal limits. Adrenals/Urinary Tract: Approximate 2 cm hyperdense lesion within the left adrenal gland noted. Additional 3.4 cm hypodense right adrenal nodule. These are similar to previous, and most consistent with probable small adenomas. Kidneys equal in size with symmetric enhancement. Scattered bilateral renal cysts present, largest of which positioned at the lower pole the left kidney and measures 5.4 cm. No nephrolithiasis or hydronephrosis. No focal enhancing renal mass. No hydroureter. Bladder within normal limits. Stomach/Bowel: Stomach within normal limits. No evidence for bowel obstruction. Appendix normal. Large amount of retained stool diffusely throughout the colon, suggesting constipation. No acute inflammatory changes seen about the bowels. Lymphatic: No pathologically enlarged  intra-abdominal or pelvic lymph nodes. Reproductive: Prostate normal. Other: No free air or fluid. Musculoskeletal: No acute osseous abnormality. No worrisome lytic or blastic osseous lesions. Review of the MIP images confirms the above findings. IMPRESSION: 1. No CTA evidence for acute aortic dissection or other acute vascular abnormality. 2. Aneurysmal dilatation of the ascending aorta up to 4.7 cm. Recommend semi-annual imaging followup by CTA or MRA and referral to cardiothoracic surgery if not already obtained. This recommendation follows 2010 ACCF/AHA/AATS/ACR/ASA/SCA/SCAI/SIR/STS/SVM Guidelines for the Diagnosis and Management of Patients With Thoracic Aortic Disease. Circulation. 2010; 121: e266-e36. 3. Mild aneurysmal dilatation of the infrarenal aorta to 3.0 cm. Recommend followup by ultrasound in 3 years. This recommendation follows ACR consensus guidelines: White Paper of the ACR Incidental Findings Committee II on Vascular Findings. J Am Coll Radiol 2013; 34:742-595 4. Moderate atheromatous plaque throughout the intra-abdominal aorta and bilateral iliac arteries as above, with diffuse 3 vessel coronary artery calcifications. 5. No other acute abnormality within the chest, abdomen, and pelvis. 6. Large amount of retained stool within the colon, suggesting constipation. 7. 4.5 cm benign hepatic hemangioma. Electronically Signed   By: Jeannine Boga M.D.   On: 10/27/2016 17:47   Dg Hip Unilat W Or Wo Pelvis 2-3 Views Right  Result Date: 10/27/2016 CLINICAL DATA:  Right hip pain.  Possible fall. EXAM: DG HIP (WITH OR WITHOUT PELVIS) 2-3V RIGHT COMPARISON:  None. FINDINGS: Minimal right femoral head and neck junction spur formation. Mild lower lumbar spine degenerative changes. No fracture or dislocation. IMPRESSION: Minimal right hip degenerative changes and mild lower lumbar spine degenerative changes. Electronically Signed   By: Claudie Revering M.D.   On: 10/27/2016 14:09     Assessment/Plan  Essential hypertension With Hypertensive Urgency Patient BP was elevated today most likely as he did not get his meds for 2 days. He got Amlodipine and Hydralazine. Will recheck his BP in 4 hours. Conitnue to follow BP every 4 Hours. Also will restart him on  diuretic. Restart him on HCTZ. Will repeat BMP and CBC  Controlled type 2 diabetes mellitus with diabetic neuropathy,  Do not have any sugars from the facility. His A1C was 6.6.  Hyperlipidemia His LDL was high at 110. He was restarted on Atorvastatin which he had stopped taking.  Ascending aorta dilatation  Repeat CT showed size of 4.7 cm. Follow up with CT scan every 6 months  Constipation Continue Miralax and Senna  Weakness Not sure the Cause. Be careful with his BP as he was some orthostatic in hospital but not in Facility. Continue Therapy. Right hip pain Xray just showed mild arthritis. Continue Ultracet.  B12 deficiency Started on PO supplement. Follow up in few weeks.  Depression Will restart him On Zoloft 50 mg Titrate higher if needed.   Family/ staff Communication:   Labs/tests ordered: CBC and BMP.  Total time spent in this patient care encounter was 45_ minutes; greater than 50% of the visit spent  coordinating care for problems addressed at this encounter.

## 2016-11-15 ENCOUNTER — Ambulatory Visit (INDEPENDENT_AMBULATORY_CARE_PROVIDER_SITE_OTHER): Payer: Self-pay | Admitting: Cardiothoracic Surgery

## 2016-11-15 ENCOUNTER — Encounter: Payer: Self-pay | Admitting: Cardiothoracic Surgery

## 2016-11-15 VITALS — BP 138/86 | HR 90 | Resp 20 | Ht >= 80 in | Wt 201.0 lb

## 2016-11-15 DIAGNOSIS — I712 Thoracic aortic aneurysm, without rupture: Secondary | ICD-10-CM

## 2016-11-15 DIAGNOSIS — I7121 Aneurysm of the ascending aorta, without rupture: Secondary | ICD-10-CM

## 2016-11-15 NOTE — Progress Notes (Signed)
[address]       [place] [address]             [phone]                    Jakarius Achee [facility] Medical Record #[id number] Date of Birth: [birth year]  Referring: Louellen Molder, MD Primary Care: Dr Kandis Cocking  Chief Complaint:   Referred for 4.7 cm ascending thoracic  aneurysm , patient was unsure why he was brought for his appointment today. He noted he had been feeling poorly lately.  History of Present Illness:    Javier Rivera 56 y.o. male is seen in the office  today in follow up after recent admission to SNF. He has H/O Hypertension, Hyperlipidemia, Diabetes mellitus, Ascending Thoracic Aneurysm, H/O Cocaine and tobacco Abuse..  Patient was admitted to [facility] for Generalized weakness, being discharged several weeks ago He was not taking his meds regularly. He presented with progressive weakness in his legs and numbness in both legs and arms. He got so weak that he couldn't walk. He says he has problems with his Gait. He was noted today to be severely hypertensive blood pressures to 220/130    His medical care is almost all through the ER,  In the ER 2016 patient was found to have second-degree AV block and cardiologist was consulted echocardiogram and CT at that time showed dilated aortic root with mild aortic insufficiency. Patient's blood pressure was also found to be elevated. Patient states over the last one he has not been taking his medications due to financial issues. And he also has lost at least 100 pounds in last 1 year .  The patient is tall 69feet 9 in. He notes that he is tall is gone and his family. There is no diagnosis of Marfan's in his family, nor early death from dissection or unexplained cause. His parents both in their 95s of throat and lung cancer.   Patient denies any visual problems, he's had no history of pneumothorax.   Current Activity/ Functional Status:  Patient is not independent with mobility/ambulation,  transfers, ADL's, IADL's.   Zubrod Score: At the time of surgery this patient's most appropriate activity status/level should be described as: []     0    Normal activity, no symptoms []     1    Restricted in physical strenuous activity but ambulatory, able to do out light work []     2    Ambulatory and capable of self care, unable to do work activities, up and about               >50 % of waking hours                              [x]     3    Only limited self care, in bed greater than 50% of waking hours []     4    Completely disabled, no self care, confined to bed or chair []     5    Moribund   Past Medical History:  Diagnosis Date  . Diabetes mellitus without complication ([place])   . Hypertension   . Renal disorder    pt stated that masses were found on his kidneys  . Renal insufficiency   . Stroke [facility])     Past Surgical History:  Procedure Laterality Date  . KNEE SURGERY  bilateral    Family History  Problem Relation Age of Onset  . Diabetes Mellitus II Mother   . Hypertension Mother   . Diabetes Mellitus II Sister   . Hypertension Sister     Social History   Social History  . Marital status: Single    Spouse name: N/A  . Number of children: N/A  . Years of education: N/A   Occupational History  . Not on file.   Social History Main Topics  . Smoking status: Light Tobacco Smoker    Packs/day: 0.25    Years: 20.00    Types: Cigarettes  . Smokeless tobacco: Never Used  . Alcohol use No  . 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 Sexual activity: Not on file   Other Topics Concern  . Not on file   Social History Narrative  . No narrative on file    History  Smoking Status  . Light Tobacco Smoker  . Packs/day: 0.25  . Years: 20.00  . Types: Cigarettes  Smokeless Tobacco  . Never Used    History  Alcohol Use No     Allergies  Allergen Reactions  . Clonidine Derivatives     Pt reported the sweats and shakes  .  Lisinopril Palpitations    "made me real sick, made my heart rate scatter"    Current Outpatient Prescriptions  Medication Sig Dispense Refill  . amLODipine (NORVASC) 10 MG tablet Take 1 tablet (10 mg total) by mouth daily. 30 tablet 2  . aspirin EC 81 MG tablet Take 1 tablet (81 mg total) by mouth daily. 30 tablet 2  . atorvastatin (LIPITOR) 40 MG tablet Take 1 tablet (40 mg total) by mouth daily at 6 PM. 30 tablet 2  . glipiZIDE (GLUCOTROL) 10 MG tablet Take 1 tablet (10 mg total) by mouth daily before breakfast. 30 tablet 2  . hydrALAZINE (APRESOLINE) 50 MG tablet Take 1 tablet (50 mg total) by mouth every 8 (eight) hours. 30 tablet 0  . ondansetron (ZOFRAN) 4 MG tablet Take 1 tablet (4 mg total) by mouth every 8 (eight) hours as needed for nausea or vomiting. 10 tablet 0  . polyethylene glycol (MIRALAX / GLYCOLAX) packet Take 17 g by mouth daily as needed for moderate constipation. 14 each 0  . senna-docusate (SENOKOT-S) 8.6-50 MG tablet Take 1 tablet by mouth 2 (two) times daily. 10 tablet 0  . traMADol-acetaminophen (ULTRACET) 37.5-325 MG tablet Take 1 tablet by mouth every 6 (six) hours as needed. 12 tablet 0  . vitamin B-12 (CYANOCOBALAMIN) 1000 MCG tablet Take 1 tablet (1,000 mcg total) by mouth daily. 30 tablet 0   No current facility-administered medications for this visit.       Review of Systems:     Cardiac Review of Systems: Y or N  Chest Pain [ n   ]  Resting SOB [ n  ] Exertional SOB  [ n ]  Orthopnea [n  ]   Pedal Edema [ n  ]    Palpitations [n  ] Syncope  [n  ]   Presyncope [  n ]  General Review of Systems: [Y] = yes [  ]=no Constitional: recent weight change [ y ];  Wt loss over the last 3 months [   ] anorexia [  ]; fatigue [  y]; nausea [  ]; night sweats [  ]; fever [  ]; or chills [  ];  Dental: poor dentition[  ]; Last Dentist visit:   Eye : blurred vision [  ]; diplopia [   ]; vision changes [  ];  Amaurosis fugax[  ]; Resp: cough [  ];  wheezing[  ];   hemoptysis[  ]; shortness of breath[  ]; paroxysmal nocturnal dyspnea[  ]; dyspnea on exertion[  ]; or orthopnea[  ];  GI:  gallstones[  ], vomiting[  ];  dysphagia[  ]; melena[  ];  hematochezia [  ]; heartburn[  ];   Hx of  Colonoscopy[  ]; GU: kidney stones [  ]; hematuria[  ];   dysuria [  ];  nocturia[  ];  history of     obstruction [  ]; urinary frequency [  ]             Skin: rash, swelling[  ];, hair loss[  ];  peripheral edema[  ];  or itching[  ]; Musculosketetal: myalgias[  ];  joint swelling[  ];  joint erythema[  ];  joint pain[  ];  back pain[  ];  Heme/Lymph: bruising[  ];  bleeding[  ];  anemia[  ];  Neuro: TIA[ y ];  headaches[  ];  stroke[ y ];  vertigo[  ];  seizures[  ];   paresthesias[ y ];  difficulty walking[ y ];  Psych:depression[  ]; anxiety[  ];  Endocrine: diabetes[ y ];  thyroid dysfunction[  ];  Immunizations: Flu up to date [ y ]; Pneumococcal up to date Blue.[place]ese  ];  Other:  Physical Exam: BP 138/86   Pulse 90   Resp 20   Ht 6\' 9"  (2.057 m)   Wt 201 lb (91.2 kg)   SpO2 96% Comment: RA  BMI 21.54 kg/m   PHYSICAL EXAMINATION: General appearance: alert, cooperative and appears older than stated age Head: Normocephalic, without obvious abnormality, atraumatic Neck: no adenopathy, no carotid bruit, no JVD, supple, symmetrical, trachea midline and thyroid not enlarged, symmetric, no tenderness/mass/nodules Lymph nodes: Cervical, supraclavicular, and axillary nodes normal. Resp: clear to auscultation bilaterally Back: symmetric, no curvature. ROM normal. No CVA tenderness. Cardio: regular rate and rhythm, S1, S2 normal, no murmur, click, rub or gallop, no pectus, no murmur of aortic insufficiency GI: soft, non-tender; bowel sounds normal; no masses,  no organomegaly Extremities: extremities normal, atraumatic, no cyanosis or edema and Homans sign is negative, no sign of DVT Neurologic: Grossly normal Patient has no visual problems, no striae , no pectus  abnormality, negative wrist and thumb sign  Patient came into the office in a wheelchair with unable to stand and get on the exam table, city was too weak  Diagnostic Studies & Laboratory data:     Recent Radiology Findings:    Mr Brain Wo Contrast  Result Date: 10/28/2016 CLINICAL DATA:  Right-sided weakness EXAM: MRI HEAD WITHOUT CONTRAST TECHNIQUE: Multiplanar, multiecho pulse sequences of the brain and surrounding structures were obtained without intravenous contrast. COMPARISON:  Head CT 10/27/2016 Brain MRI 10/17/2015 FINDINGS: Brain: No focal diffusion restriction to indicate acute infarct. No intraparenchymal hemorrhage. There is beginning confluent hyperintense T2-weighted signal within the periventricular, deep and subcortical white matter, most often seen in the setting of chronic microvascular ischemia. No mass lesion or midline shift. No hydrocephalus or extra-axial fluid collection. The midline structures are normal. No age advanced or lobar predominant atrophy. Vascular: Major intracranial arterial and venous sinus flow voids are preserved. No evidence of chronic microhemorrhage or amyloid angiopathy. Skull and upper cervical spine:  The visualized skull base, calvarium, upper cervical spine and extracranial soft tissues are normal. Sinuses/Orbits: No fluid levels or advanced mucosal thickening. No mastoid effusion. Normal orbits. IMPRESSION: 1. No acute intracranial abnormality. 2. 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 Electronically Signed   By: Ulyses Jarred M.D.   On: 10/28/2016 06:52   Ct Angio Chest/abd/pel For Dissection W And/or Wo Contrast  Result Date: 10/27/2016 CLINICAL DATA:  Initial evaluation for acute weakness, tachycardia, or right groin/ thigh/ hip pain. EXAM: CT ANGIOGRAPHY CHEST, ABDOMEN AND PELVIS TECHNIQUE: Multidetector CT imaging through the chest, abdomen and pelvis was performed using the standard protocol during bolus administration of  intravenous contrast. Multiplanar reconstructed images and MIPs were obtained and reviewed to evaluate the vascular anatomy. CONTRAST:  100 cc of Isovue 370. COMPARISON:  Prior radiograph from 03/29/2016. FINDINGS: CTA CHEST FINDINGS Cardiovascular: Precontrast imaging through the chest demonstrate scattered atheromatous plaque within the ascending aorta and aortic arch. No mural thrombus or other complication. Postcontrast imaging demonstrates no evidence for acute aortic dissection. Ascending aorta dilated up to 4.7 cm in maximal diameter (series 5, image 54). Minimal plaque about the origin of the left subclavian artery without stenosis. Partially visualized great vessels widely patent and within normal limits. Heart size within normal limits. Scattered 3 vessel coronary artery calcifications. No pericardial effusion. Limited evaluation of the pulmonary arteries grossly unremarkable. Mediastinum/Nodes: Thyroid normal. No pathologically enlarged mediastinal, hilar, or axillary lymph nodes. Esophagus within normal limits. Lungs/Pleura: Mild deep tendon atelectasis within the lungs. No focal infiltrates. No pulmonary edema or pleural effusion. No pneumothorax. No worrisome pulmonary nodule or mass. Musculoskeletal: No acute osseous abnormality. No worrisome lytic or blastic osseous lesions. Review of the MIP images confirms the above findings. CTA ABDOMEN AND PELVIS FINDINGS VASCULAR Aorta: No evidence for acute dissection or other abnormality within the intra-abdominal aorta. Infrarenal aorta dilated 3.0 cm. Mild to moderate calcified and noncalcified plaque throughout the aorta itself. Celiac: Celiac axis and its branch vessels widely patent. SMA: SMA widely patent. Renals: Single renal arteries present bilaterally, both widely patent. IMA: IMA patent. Inflow: Extensive atheromatous plaque about the common iliac arteries bilaterally just distal to the bifurcation. Small focal penetrating atheromatous ulcer noted  at the proximal right common iliac artery (series 5, image 158). Common iliac arteries ectatic measuring up to 14 mm on the right and 16 mm on the left. Moderate stenosis at the origin of the right internal iliac artery of approximately 50-60% (series 5, image 174). More mild to moderate narrowing at the origin of the left internal iliac artery. Extensive atheromatous plaque throughout the internal iliac vessels bilaterally. Right external iliac artery patent. Proximal femoral arteries within the thigh widely patent without abnormality. Left external iliac artery demonstrates extensive atheromatous plaque without significant stenosis. Visualized proximal vessels within the left thigh patent without high-grade stenosis or other acute abnormality. Veins: No venous abnormality. Review of the MIP images confirms the above findings. NON-VASCULAR Hepatobiliary: 4.5 cm hypodense lesion with peripheral nodular enhancement within the left hepatic lobe most compatible with a benign hemangioma. Liver otherwise unremarkable. Gallbladder within normal limits. No biliary dilatation. Pancreas: Pancreas within normal limits. Spleen: Spleen within normal limits. Adrenals/Urinary Tract: Approximate 2 cm hyperdense lesion within the left adrenal gland noted. Additional 3.4 cm hypodense right adrenal nodule. These are similar to previous, and most consistent with probable small adenomas. Kidneys equal in size with symmetric enhancement. Scattered bilateral renal cysts present, largest of which positioned at the lower pole the left kidney and  measures 5.4 cm. No nephrolithiasis or hydronephrosis. No focal enhancing renal mass. No hydroureter. Bladder within normal limits. Stomach/Bowel: Stomach within normal limits. No evidence for bowel obstruction. Appendix normal. Large amount of retained stool diffusely throughout the colon, suggesting constipation. No acute inflammatory changes seen about the bowels. Lymphatic: No pathologically  enlarged intra-abdominal or pelvic lymph nodes. Reproductive: Prostate normal. Other: No free air or fluid. Musculoskeletal: No acute osseous abnormality. No worrisome lytic or blastic osseous lesions. Review of the MIP images confirms the above findings. IMPRESSION: 1. No CTA evidence for acute aortic dissection or other acute vascular abnormality. 2. Aneurysmal dilatation of the ascending aorta up to 4.7 cm. Recommend semi-annual imaging followup by CTA or MRA and referral to cardiothoracic surgery if not already obtained. This recommendation follows 2010 ACCF/AHA/AATS/ACR/ASA/SCA/SCAI/SIR/STS/SVM Guidelines for the Diagnosis and Management of Patients With Thoracic Aortic Disease. Circulation. 2010; 121: e266-e36. 3. Mild aneurysmal dilatation of the infrarenal aorta to 3.0 cm. Recommend followup by ultrasound in 3 years. This recommendation follows ACR consensus guidelines: White Paper of the ACR Incidental Findings Committee II on Vascular Findings. J Am Coll Radiol 2013; 71:062-694 4. Moderate atheromatous plaque throughout the intra-abdominal aorta and bilateral iliac arteries as above, with diffuse 3 vessel coronary artery calcifications. 5. No other acute abnormality within the chest, abdomen, and pelvis. 6. Large amount of retained stool within the colon, suggesting constipation. 7. 4.5 cm benign hepatic hemangioma. Electronically Signed   By: Jeannine Boga M.D.   On: 10/27/2016 17:47     I have independently reviewed the above radiologic studies.  Recent Lab Findings: Lab Results  Component Value Date   WBC 3.4 (L) 10/29/2016   HGB 13.8 10/29/2016   HCT 41.6 10/29/2016   PLT 184 10/29/2016   GLUCOSE 102 (H) 10/29/2016   CHOL 164 10/28/2016   TRIG 67 10/28/2016   HDL 41 10/28/2016   LDLCALC 110 (H) 10/28/2016   ALT 17 03/31/2016   AST 17 03/31/2016   NA 139 10/29/2016   K 3.5 10/29/2016   CL 104 10/29/2016   CREATININE 1.13 10/29/2016   BUN 10 10/29/2016   CO2 27 10/29/2016     TSH 2.273 10/28/2016   INR 1.09 03/29/2016   HGBA1C 6.6 (H) 10/28/2016   Aortic Size Index=    4.7  /Body surface area is 2.28 meters squared. = 2.06  < 2.75 cm/m2      4% risk per year 2.75 to 4.25          8% risk per year > 4.25 cm/m2    20% risk per year  Adult z score  BSA 2.28  z-score 3.82   Assessment / Plan:   Patient with dilated ascending aorta, appears stable since the scan in 2016, on exam does not have a murmur of aortic insufficiency, the patient has had no genetic workup for connective tissue disorder, he has no family history of aortic dissection. Multiple colon with a dilated ascending aorta does not meet other criteria for diagnosis of Marfan's. I've explained to the patient while he was even sent to the office,  Currently he is not acceptable candidate for elective or emergency repair of an ascending aorta due to his numerous comorbidities.  We'll have him reestablish with cardiology, her blood pressure maintenance and follow-up echocardiogram from study done in January 2017. We'll plan to see him back in 6 months with a gated CTA of the chest . I stressed to the patient the importance of good blood pressure management.  He was cautioned about any heavy lifting or Valsalva maneuver, but in his current condition it's unlikely that he is able to do either.   I  spent 40 minutes counseling the patient face to face and 50% or more the  time was spent in counseling and coordination of care. The total time spent in the appointment was 60 minutes.  Grace Isaac MD      [address] [place] [address] Office [phone]   Beeper [phone]  11/15/2016 4:32 PM

## 2016-11-18 ENCOUNTER — Encounter: Payer: Self-pay | Admitting: Cardiovascular Disease

## 2016-11-23 ENCOUNTER — Encounter: Payer: Self-pay | Admitting: Adult Health

## 2016-11-23 ENCOUNTER — Non-Acute Institutional Stay (SKILLED_NURSING_FACILITY): Payer: Self-pay | Admitting: Adult Health

## 2016-11-23 DIAGNOSIS — I10 Essential (primary) hypertension: Secondary | ICD-10-CM

## 2016-11-23 NOTE — Progress Notes (Signed)
Location:   [facility] Room Number: 128 A Place of Service:  SNF (31)   CODE STATUS: Full Code  Allergies  Allergen Reactions  . Clonidine Derivatives     Pt reported the sweats and shakes  . Lisinopril Palpitations    "made me real sick, made my heart rate scatter"    Chief Complaint  Patient presents with  . Acute Visit    Dizziness    HPI:  Staff reports that he is having issues with vertigo during therapy. He has not experience any loss of consciousness  His orthostatic vitals are Lay: 152/98 81 99% Sit: 148/102 92 97% Stand: 11/78 117 96% He will need to have his medications adjusted in order to avoid this issue.   Past Medical History:  Diagnosis Date  . Diabetes mellitus without complication ([place])   . Hypertension   . Renal disorder    pt stated that masses were found on his kidneys  . Renal insufficiency   . Stroke [facility])     Past Surgical History:  Procedure Laterality Date  . KNEE SURGERY     bilateral    Social History   Social History  . Marital status: Single    Spouse name: N/A  . Number of children: N/A  . Years of education: N/A   Occupational History  . Not on file.   Social History Main Topics  . Smoking status: Light Tobacco Smoker    Packs/day: 0.25    Years: 20.00    Types: Cigarettes  . Smokeless tobacco: Never Used  . Alcohol use No  . 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 Sexual activity: Not on file   Other Topics Concern  . Not on file   Social History Narrative  . No narrative on file   Family History  Problem Relation Age of Onset  . Diabetes Mellitus II Mother   . Hypertension Mother   . Diabetes Mellitus II Sister   . Hypertension Sister       VITAL SIGNS BP 118/78 (Patient Position: Standing)   Pulse (!) 117   Resp 20   Ht 6\' 9"  (2.057 m)   Wt 190 lb 3.2 oz (86.3 kg)   SpO2 96%   BMI 20.38 kg/m   Patient's Medications  New Prescriptions   No medications  on file  Previous Medications   AMLODIPINE (NORVASC) 10 MG TABLET    Take 1 tablet (10 mg total) by mouth daily.   ASPIRIN EC 81 MG TABLET    Take 1 tablet (81 mg total) by mouth daily.   ATORVASTATIN (LIPITOR) 40 MG TABLET    Take 1 tablet (40 mg total) by mouth daily at 6 PM.   GABAPENTIN (NEURONTIN) 100 MG CAPSULE    Take 100 mg by mouth 3 (three) times daily.   GLIPIZIDE (GLUCOTROL) 10 MG TABLET    Take 1 tablet (10 mg total) by mouth daily before breakfast.   HYDRALAZINE (APRESOLINE) 50 MG TABLET    Take 50 mg by mouth 4 (four) times daily.   HYDROCHLOROTHIAZIDE (HYDRODIURIL) 25 MG TABLET    Take 25 mg by mouth daily.   ONDANSETRON (ZOFRAN) 4 MG TABLET    Take 1 tablet (4 mg total) by mouth every 8 (eight) hours as needed for nausea or vomiting.   POLYETHYLENE GLYCOL (MIRALAX / GLYCOLAX) PACKET    Take 17 g by mouth daily as needed for moderate  constipation.   SENNA-DOCUSATE (SENOKOT-S) 8.6-50 MG TABLET    Take 1 tablet by mouth 2 (two) times daily.   SERTRALINE (ZOLOFT) 50 MG TABLET    Take 50 mg by mouth every evening.   TRAMADOL-ACETAMINOPHEN (ULTRACET) 37.5-325 MG TABLET    Take 1 tablet by mouth every 6 (six) hours as needed.   VITAMIN B-12 (CYANOCOBALAMIN) 1000 MCG TABLET    Take 1 tablet (1,000 mcg total) by mouth daily.  Modified Medications   No medications on file  Discontinued Medications   HYDRALAZINE (APRESOLINE) 50 MG TABLET    Take 1 tablet (50 mg total) by mouth every 8 (eight) hours.     SIGNIFICANT DIAGNOSTIC EXAMS  10-27-16: right hip x-ray: Minimal right hip degenerative changes and mild lower lumbar spine degenerative changes.  10-27-16: ct of head: Mild atrophic and ischemic changes without acute abnormality.   10-27-16: ct angio of chest; abdomen and pelvis: 1. No CTA evidence for acute aortic dissection or other acute vascular abnormality. 2. Aneurysmal dilatation of the ascending aorta up to 4.7 cm. 3. Mild aneurysmal dilatation of the infrarenal aorta to 3.0  cm. 4. Moderate atheromatous plaque throughout the intra-abdominal aorta and bilateral iliac arteries as above, with diffuse 3 vessel coronary artery calcifications. 5. No other acute abnormality within the chest, abdomen, and pelvis. 6. Large amount of retained stool within the colon, suggesting constipation. 7. 4.5 cm benign hepatic hemangioma.  10-28-16: mri of head: 1. No acute intracranial abnormality. 2. 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   LABS REVIEWED:   10-27-16: wbc 3.7; hgb 16.6; hct 48.3; mcv 87.3; plt 211; glucose 116; bun 13; creat 0.95; k+ 3.5 ;na++ 137; HIV: nr 10-28-16: wbc 3.5; hgb 15.2; hct 44.8; mcv 87.7; plt 196; glucose 91; bun 8; creat 0.97; k+ 3.1; na++ 139; vit B 12: 279; hgb a1c 6.6; tsh 2.273; chol 164; ldl 110; trig 67; hdl 41 10-29-16: wbc 3.4; hgb 13.8; hct 41.6; mcv 88.5; plt 184; glucose 102; bun 10; creat 1.13; k+ 3.5; na++ 139; mag 1.9    Review of Systems  Constitutional: Negative for malaise/fatigue.  Respiratory: Negative for cough and shortness of breath.   Cardiovascular: Negative for chest pain, palpitations and leg swelling.  Gastrointestinal: Negative for abdominal pain, constipation and heartburn.  Musculoskeletal: Negative for back pain, joint pain and myalgias.  Skin: Negative.   Neurological: has vertigo  Psychiatric/Behavioral: The patient is not nervous/anxious.     Physical Exam  Constitutional: No distress.  Eyes: Conjunctivae are normal.  Neck: Neck supple. No JVD present. No thyromegaly present.  Cardiovascular: Normal rate, regular rhythm and intact distal pulses.   Respiratory: Effort normal and breath sounds normal. No respiratory distress. He has no wheezes.  GI: Soft. Bowel sounds are normal. He exhibits no distension. There is no tenderness.  Musculoskeletal: He exhibits no edema.  Able to move all extremities  Has lower extremity weakness present   Lymphadenopathy:    He has no cervical adenopathy.    Neurological: He is alert.  Skin: Skin is warm and dry. He is not diaphoretic.  Psychiatric: He has a normal mood and affect.     ASSESSMENT/ PLAN:  1. Hypertension: will continue asa 81 mg daily norvasc 10 mg daily apresoline 50 mg four times daily and will stop his hctz; will have nursing check orthostatic vital signs daily for one week.      MD is aware of resident's narcotic use and is in agreement with current plan of care.  We will attempt to wean resident as apropriate    Ok Edwards NP [facility]  Contact [phone] Monday through Friday 8am- 5pm  After hours call [phone]

## 2016-11-25 ENCOUNTER — Encounter: Payer: Self-pay | Admitting: Adult Health

## 2016-11-25 ENCOUNTER — Non-Acute Institutional Stay (SKILLED_NURSING_FACILITY): Payer: Self-pay | Admitting: Adult Health

## 2016-11-25 DIAGNOSIS — I10 Essential (primary) hypertension: Secondary | ICD-10-CM

## 2016-11-25 DIAGNOSIS — I6302 Cerebral infarction due to thrombosis of basilar artery: Secondary | ICD-10-CM

## 2016-11-25 DIAGNOSIS — K5901 Slow transit constipation: Secondary | ICD-10-CM

## 2016-11-25 DIAGNOSIS — I712 Thoracic aortic aneurysm, without rupture, unspecified: Secondary | ICD-10-CM

## 2016-11-25 DIAGNOSIS — E1140 Type 2 diabetes mellitus with diabetic neuropathy, unspecified: Secondary | ICD-10-CM

## 2016-11-25 DIAGNOSIS — E785 Hyperlipidemia, unspecified: Secondary | ICD-10-CM

## 2016-11-25 DIAGNOSIS — E43 Unspecified severe protein-calorie malnutrition: Secondary | ICD-10-CM

## 2016-11-25 NOTE — Progress Notes (Signed)
Location:    [facility] Room Number: 128 A Place of Service:  SNF (31)   CODE STATUS: Full Code  Allergies  Allergen Reactions  . Clonidine Derivatives     Pt reported the sweats and shakes  . Lisinopril Palpitations    "made me real sick, made my heart rate scatter"    Chief Complaint  Patient presents with  . Medical Management of Chronic Issues    1 month follow up    HPI:  He is a short term resident of this facility being seen for the management of her chronic illnesses. He tells me that his vertigo is getting much better since stopping the hctz. There are no nursing concerns at this time.    Past Medical History:  Diagnosis Date  . Diabetes mellitus without complication ([place])   . Hypertension   . Renal disorder    pt stated that masses were found on his kidneys  . Renal insufficiency   . Stroke [facility])     Past Surgical History:  Procedure Laterality Date  . KNEE SURGERY     bilateral    Social History   Social History  . Marital status: Single    Spouse name: N/A  . Number of children: N/A  . Years of education: N/A   Occupational History  . Not on file.   Social History Main Topics  . Smoking status: Light Tobacco Smoker    Packs/day: 0.25    Years: 20.00    Types: Cigarettes  . Smokeless tobacco: Never Used  . Alcohol use No  . 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 Sexual activity: Not on file   Other Topics Concern  . Not on file   Social History Narrative  . No narrative on file   Family History  Problem Relation Age of Onset  . Diabetes Mellitus II Mother   . Hypertension Mother   . Diabetes Mellitus II Sister   . Hypertension Sister       VITAL SIGNS BP 128/74   Pulse (!) 117   Resp 20   Ht 6\' 9"  (2.057 m)   Wt 190 lb 3.2 oz (86.3 kg)   SpO2 96%   BMI 20.38 kg/m   Patient's Medications  New Prescriptions   No medications on file  Previous Medications   AMLODIPINE (NORVASC) 10  MG TABLET    Take 1 tablet (10 mg total) by mouth daily.   ASPIRIN EC 81 MG TABLET    Take 1 tablet (81 mg total) by mouth daily.   ATORVASTATIN (LIPITOR) 40 MG TABLET    Take 1 tablet (40 mg total) by mouth daily at 6 PM.   GABAPENTIN (NEURONTIN) 100 MG CAPSULE    Take 100 mg by mouth 3 (three) times daily.   GLIPIZIDE (GLUCOTROL) 10 MG TABLET    Take 1 tablet (10 mg total) by mouth daily before breakfast.   HYDRALAZINE (APRESOLINE) 50 MG TABLET    Take 50 mg by mouth 4 (four) times daily.   ONDANSETRON (ZOFRAN) 4 MG TABLET    Take 1 tablet (4 mg total) by mouth every 8 (eight) hours as needed for nausea or vomiting.   POLYETHYLENE GLYCOL (MIRALAX / GLYCOLAX) PACKET    Take 17 g by mouth daily as needed for moderate constipation.   SENNOSIDES-DOCUSATE SODIUM (SENOKOT-S) 8.6-50 MG TABLET    Take 2 tablets by mouth 2 (two) times daily.  SERTRALINE (ZOLOFT) 50 MG TABLET    Take 50 mg by mouth every evening.   TRAMADOL-ACETAMINOPHEN (ULTRACET) 37.5-325 MG TABLET    Take 1 tablet by mouth every 6 (six) hours as needed.   VITAMIN B-12 (CYANOCOBALAMIN) 1000 MCG TABLET    Take 1 tablet (1,000 mcg total) by mouth daily.  Modified Medications   No medications on file  Discontinued Medications   HYDROCHLOROTHIAZIDE (HYDRODIURIL) 25 MG TABLET    Take 25 mg by mouth daily.   SENNA-DOCUSATE (SENOKOT-S) 8.6-50 MG TABLET    Take 1 tablet by mouth 2 (two) times daily.     SIGNIFICANT DIAGNOSTIC EXAMS  10-27-16: right hip x-ray: Minimal right hip degenerative changes and mild lower lumbar spine degenerative changes.  10-27-16: ct of head: Mild atrophic and ischemic changes without acute abnormality.   10-27-16: ct angio of chest; abdomen and pelvis: 1. No CTA evidence for acute aortic dissection or other acute vascular abnormality. 2. Aneurysmal dilatation of the ascending aorta up to 4.7 cm. 3. Mild aneurysmal dilatation of the infrarenal aorta to 3.0 cm. 4. Moderate atheromatous plaque throughout the  intra-abdominal aorta and bilateral iliac arteries as above, with diffuse 3 vessel coronary artery calcifications. 5. No other acute abnormality within the chest, abdomen, and pelvis. 6. Large amount of retained stool within the colon, suggesting constipation. 7. 4.5 cm benign hepatic hemangioma.  10-28-16: mri of head: 1. No acute intracranial abnormality. 2. 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   LABS REVIEWED:   10-27-16: wbc 3.7; hgb 16.6; hct 48.3; mcv 87.3; plt 211; glucose 116; bun 13; creat 0.95; k+ 3.5 ;na++ 137; HIV: nr 10-28-16: wbc 3.5; hgb 15.2; hct 44.8; mcv 87.7; plt 196; glucose 91; bun 8; creat 0.97; k+ 3.1; na++ 139; vit B 12: 279; hgb a1c 6.6; tsh 2.273; chol 164; ldl 110; trig 67; hdl 41 10-29-16: wbc 3.4; hgb 13.8; hct 41.6; mcv 88.5; plt 184; glucose 102; bun 10; creat 1.13; k+ 3.5; na++ 139; mag 1.9    Review of Systems  Constitutional: Negative for malaise/fatigue.  Respiratory: Negative for cough and shortness of breath.   Cardiovascular: Negative for chest pain, palpitations and leg swelling.  Gastrointestinal: Negative for abdominal pain, constipation and heartburn.  Musculoskeletal: Negative for back pain, joint pain and myalgias.  Skin: Negative.   Neurological: Negative for dizziness.  Psychiatric/Behavioral: The patient is not nervous/anxious.     Physical Exam  Constitutional: No distress.  Eyes: Conjunctivae are normal.  Neck: Neck supple. No JVD present. No thyromegaly present.  Cardiovascular: Normal rate, regular rhythm and intact distal pulses.   Respiratory: Effort normal and breath sounds normal. No respiratory distress. He has no wheezes.  GI: Soft. Bowel sounds are normal. He exhibits no distension. There is no tenderness.  Musculoskeletal: He exhibits no edema.  Able to move all extremities  Has lower extremity weakness present   Lymphadenopathy:    He has no cervical adenopathy.  Neurological: He is alert.  Skin: Skin is  warm and dry. He is not diaphoretic.  Psychiatric: He has a normal mood and affect.     ASSESSMENT/ PLAN:  1. Hypertension: b/p 128/74  will continue asa 81 mg daily norvasc 10 mg daily hydralazine 50 mg four times daily    2. Dyslipidemia: ldl 110; will continue lipitor 40 mg daily  3. Diabetes: hgb a1c 6.6; will continue glucotrol 10 mg daily   4. Tobacco use: his nicotine patch was stopped.    5. Constipation: will continue senna  s twice daily and miralax daily as needed  6. CVA: is neurologically stable will continue asa 81 mg daily  7. Failure to thrive and protein calorie malnutrition: will continue supplements per facility protocol   8. thoracic aortic aneurysm with rupture: will monitor       Ok Edwards NP [facility]  Contact [phone] Monday through Friday 8am- 5pm  After hours call [phone]

## 2016-11-30 ENCOUNTER — Encounter: Payer: Self-pay | Admitting: Surgery

## 2016-12-08 ENCOUNTER — Encounter: Payer: Self-pay | Admitting: Cardiovascular Disease

## 2016-12-08 ENCOUNTER — Ambulatory Visit (INDEPENDENT_AMBULATORY_CARE_PROVIDER_SITE_OTHER): Payer: Self-pay | Admitting: Cardiovascular Disease

## 2016-12-08 VITALS — BP 120/92 | HR 74 | Ht >= 80 in | Wt 202.8 lb

## 2016-12-08 DIAGNOSIS — I10 Essential (primary) hypertension: Secondary | ICD-10-CM

## 2016-12-08 DIAGNOSIS — I712 Thoracic aortic aneurysm, without rupture, unspecified: Secondary | ICD-10-CM

## 2016-12-08 NOTE — Patient Instructions (Addendum)
Medication Instructions:  Your physician recommends that you continue on your current medications as directed. Please refer to the Current Medication list given to you today.   Labwork: None Ordered   Testing/Procedures: None Ordered   Follow-Up: Your physician wants you to follow-up in: 6 months with Dr. Oval Linsey.  You will receive a reminder letter in the mail two months in advance. If you don't receive a letter, please call our office to schedule the follow-up appointment.   If you need a refill on your cardiac medications before your next appointment, please call your pharmacy.   Thank you for choosing [facility]! Christen Bame, RN [phone]

## 2016-12-08 NOTE — Progress Notes (Signed)
Cardiology Office Note   Date:  12/08/2016   ID:  Javier Rivera, DOB 1961/02/18, MRN [id number]  PCP:  Gildardo Cranker, DO  Cardiologist:  Skeet Latch, MD , Jolyn Nap, MD   Chief Complaint  Patient presents with  . Hypertension   Problem list 1. Diabetes mellitus 2. Hypertension  3. Chronic kidney disease 4. 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 History of CVA-March 2017 6. Ascending thoracic aneurism - [place].    History of Present Illness:  Javier Rivera is a 56 y.o. male who is being seen today as a work in visit  for the evaluation of history of  heart block  at the request of  Dr. Ceasar Mons.   The patient has seen Dr. Oval Linsey several times in the hospital and has been seen by Dr. Caryl Comes for heart block in 2016.    Echo in March 2017 shows normal LV function, mild AI, dilated aortic root.   His main complaint is that of fatigue. Gets dizzy at times,  No syncope.   Lives at [facility] at the present time .   Occasional CP . Dyspnea ( positive ROS , answers yes to most questions.   Was in hospital a month ago for generalized weakness and possible CVA  Work up did not reveal any CVA .  Has had the same symptoms for a year   BP is well controlled at present .   Has had a stroke,  Able to walk some, was seen in the wheelchair.   Has stopped smoking .   No cocaine.     Past Medical History:  Diagnosis Date  . Diabetes mellitus without complication ([place])   . Hypertension   . Renal disorder    pt stated that masses were found on his kidneys  . Renal insufficiency   . Stroke [facility])     Past Surgical History:  Procedure Laterality Date  . KNEE SURGERY     bilateral     Current Outpatient Prescriptions  Medication Sig Dispense Refill  . amLODipine (NORVASC) 10 MG tablet Take 1 tablet (10 mg total) by mouth daily. 30 tablet 2  . aspirin EC 81 MG tablet Take 1 tablet (81 mg total) by mouth daily. 30 tablet 2  .  atorvastatin (LIPITOR) 40 MG tablet Take 1 tablet (40 mg total) by mouth daily at 6 PM. 30 tablet 2  . gabapentin (NEURONTIN) 100 MG capsule Take 100 mg by mouth 3 (three) times daily.    Marland Kitchen glipiZIDE (GLUCOTROL) 10 MG tablet Take 1 tablet (10 mg total) by mouth daily before breakfast. 30 tablet 2  . hydrALAZINE (APRESOLINE) 50 MG tablet Take 50 mg by mouth 4 (four) times daily.    . ondansetron (ZOFRAN) 4 MG tablet Take 1 tablet (4 mg total) by mouth every 8 (eight) hours as needed for nausea or vomiting. 10 tablet 0  . polyethylene glycol (MIRALAX / GLYCOLAX) packet Take 17 g by mouth daily as needed for moderate constipation. 14 each 0  . sennosides-docusate sodium (SENOKOT-S) 8.6-50 MG tablet Take 2 tablets by mouth 2 (two) times daily.    . traMADol-acetaminophen (ULTRACET) 37.5-325 MG tablet Take 1 tablet by mouth every 6 (six) hours as needed. 12 tablet 0   No current facility-administered medications for this visit.     No flowsheet data found.    Allergies:   Clonidine derivatives and Lisinopril    Social History:  The patient  reports that he has been smoking Cigarettes.  He has a 5.00 pack-year smoking history. He has never used smokeless tobacco. He reports that he does not drink alcohol or use drugs.   Family History:  The patient's family history includes Diabetes Mellitus II in his mother and sister; Hypertension in his mother and sister.    ROS:  Please see the history of present illness.    Review of Systems: Constitutional:  denies fever, chills, diaphoresis, appetite change and fatigue.  HEENT: denies photophobia, eye pain, redness, hearing loss, ear pain, congestion, sore throat, rhinorrhea, sneezing, neck pain, neck stiffness and tinnitus.  Respiratory: denies SOB, DOE, cough, chest tightness, and wheezing.  Cardiovascular: denies chest pain, palpitations and leg swelling.  Gastrointestinal: denies nausea, vomiting, abdominal pain, diarrhea, constipation, blood in  stool.  Genitourinary: denies dysuria, urgency, frequency, hematuria, flank pain and difficulty urinating.  Musculoskeletal: denies  myalgias, back pain, joint swelling, arthralgias and gait problem.   Skin: denies pallor, rash and wound.  Neurological: denies dizziness, seizures, syncope, weakness, light-headedness, numbness and headaches.   Hematological: denies adenopathy, easy bruising, personal or family bleeding history.  Psychiatric/ Behavioral: denies suicidal ideation, mood changes, confusion, nervousness, sleep disturbance and agitation.       All other systems are reviewed and negative.    PHYSICAL EXAM: VS:  BP (!) 120/92 (BP Location: Right Arm, Patient Position: Sitting, Cuff Size: Normal)   Pulse 74   Ht 6\' 9"  (2.057 m)   Wt 202 lb 12.8 oz (92 kg)   SpO2 96%   BMI 21.73 kg/m  , BMI Body mass index is 21.73 kg/m. GEN: Well nourished, well developed, in no acute distress  HEENT: normal  Neck: no JVD, carotid bruits, or masses Cardiac: RRR; no murmurs, rubs, or gallops,no edema  Respiratory:  clear to auscultation bilaterally, normal work of breathing GI: soft, nontender, nondistended, + BS MS: no deformity or atrophy  Skin: warm and dry, no rash Neuro:  Strength and sensation are intact Psych: normal   EKG:  EKG is not ordered today.  ECG from March 2018 - NSR .  No evidence of AV block    Recent Labs: 03/31/2016: ALT 17 10/28/2016: TSH 2.273 10/29/2016: BUN 10; Creatinine, Ser 1.13; Hemoglobin 13.8; Magnesium 1.9; Platelets 184; Potassium 3.5; Sodium 139    Lipid Panel    Component Value Date/Time   CHOL 164 10/28/2016 0508   TRIG 67 10/28/2016 0508   HDL 41 10/28/2016 0508   CHOLHDL 4.0 10/28/2016 0508   VLDL 13 10/28/2016 0508   LDLCALC 110 (H) 10/28/2016 0508      Wt Readings from Last 3 Encounters:  12/08/16 202 lb 12.8 oz (92 kg)  11/25/16 190 lb 3.2 oz (86.3 kg)  11/23/16 190 lb 3.2 oz (86.3 kg)      Other studies Reviewed: Additional  studies/ records that were reviewed today include: . Review of the above records demonstrates:    ASSESSMENT AND PLAN:  1.  Heart block :  At present he has no evidence of heart block. He's not had any episodes of syncope or presyncope.  There is no indication for pacer at this time.    He has been seen by Dr. Caryl Comes in the past. He'll follow-up with Dr. Oval Linsey for further evaluation.  2. Hypertension: His blood pressure at present is well-controlled. Continue current medications.  3. Generalized fatigue: He's had these symptoms for over a year. We will leave further evaluation of this up to his primary  medical doctor.  Current medicines are reviewed at length with the patient today.  The patient does not have concerns regarding medicines.  Labs/ tests ordered today include:  No orders of the defined types were placed in this encounter.    Disposition:   FU with Dr. Oval Linsey in 6 months      Mertie Moores, MD  12/08/2016 11:29 AM    [address], [place], [place]  [address] Phone: [phone]; Fax: [phone]

## 2016-12-13 ENCOUNTER — Non-Acute Institutional Stay (SKILLED_NURSING_FACILITY): Payer: Self-pay | Admitting: Adult Health

## 2016-12-13 ENCOUNTER — Encounter: Payer: Self-pay | Admitting: Adult Health

## 2016-12-13 DIAGNOSIS — I10 Essential (primary) hypertension: Secondary | ICD-10-CM

## 2016-12-13 DIAGNOSIS — I6302 Cerebral infarction due to thrombosis of basilar artery: Secondary | ICD-10-CM

## 2016-12-13 DIAGNOSIS — E1140 Type 2 diabetes mellitus with diabetic neuropathy, unspecified: Secondary | ICD-10-CM

## 2016-12-13 DIAGNOSIS — R531 Weakness: Secondary | ICD-10-CM

## 2016-12-13 NOTE — Progress Notes (Signed)
Location:   [place] Room Number: 128 A Place of Service:  SNF (31)    CODE STATUS: Full Code  Allergies  Allergen Reactions  . Clonidine Derivatives     Pt reported the sweats and shakes  . Lisinopril Palpitations    "made me real sick, made my heart rate scatter"    Chief Complaint  Patient presents with  . Discharge Note    Discharge    HPI:   He is being discharged once his housing situation is resolved. He will not need dme or home health.  He will need his prescriptions written and will need to follow up with his medical provider.  He had been hospitalized for increased weakness. He was admitted to this facility for short term rehab.   Past Medical History:  Diagnosis Date  . Diabetes mellitus without complication ([place])   . Hypertension   . Renal disorder    pt stated that masses were found on his kidneys  . Renal insufficiency   . Stroke [facility])     Past Surgical History:  Procedure Laterality Date  . KNEE SURGERY     bilateral    Social History   Social History  . Marital status: Single    Spouse name: N/A  . Number of children: N/A  . Years of education: N/A   Occupational History  . Not on file.   Social History Main Topics  . Smoking status: Light Tobacco Smoker    Packs/day: 0.25    Years: 20.00    Types: Cigarettes  . Smokeless tobacco: Never Used  . Alcohol use No  . 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 Sexual activity: Not on file   Other Topics Concern  . Not on file   Social History Narrative  . No narrative on file   Family History  Problem Relation Age of Onset  . Diabetes Mellitus II Mother   . Hypertension Mother   . Diabetes Mellitus II Sister   . Hypertension Sister     VITAL SIGNS BP 114/72   Pulse 68   Resp 19   Ht 6\' 9"  (2.057 m)   Wt 190 lb 3.2 oz (86.3 kg)   SpO2 98%   BMI 20.38 kg/m   Patient's Medications  New Prescriptions   No medications on file    Previous Medications   AMLODIPINE (NORVASC) 10 MG TABLET    Take 1 tablet (10 mg total) by mouth daily.   ASPIRIN EC 81 MG TABLET    Take 1 tablet (81 mg total) by mouth daily.   ATORVASTATIN (LIPITOR) 40 MG TABLET    Take 1 tablet (40 mg total) by mouth daily at 6 PM.   GABAPENTIN (NEURONTIN) 100 MG CAPSULE    Take 100 mg by mouth 3 (three) times daily.   GLIPIZIDE (GLUCOTROL) 10 MG TABLET    Take 1 tablet (10 mg total) by mouth daily before breakfast.   HYDRALAZINE (APRESOLINE) 50 MG TABLET    Take 50 mg by mouth 4 (four) times daily.   ONDANSETRON (ZOFRAN) 4 MG TABLET    Take 1 tablet (4 mg total) by mouth every 8 (eight) hours as needed for nausea or vomiting.   POLYETHYLENE GLYCOL (MIRALAX / GLYCOLAX) PACKET    Take 17 g by mouth daily as needed for moderate constipation.   SENNOSIDES-DOCUSATE SODIUM (SENOKOT-S) 8.6-50 MG TABLET    Take 2 tablets by  mouth 2 (two) times daily.   SERTRALINE (ZOLOFT) 50 MG TABLET    Take 50 mg by mouth daily.   TRAMADOL-ACETAMINOPHEN (ULTRACET) 37.5-325 MG TABLET    Take 1 tablet by mouth every 6 (six) hours as needed.  Modified Medications   No medications on file  Discontinued Medications   No medications on file     SIGNIFICANT DIAGNOSTIC EXAMS  10-27-16: right hip x-ray: Minimal right hip degenerative changes and mild lower lumbar spine degenerative changes.  10-27-16: ct of head: Mild atrophic and ischemic changes without acute abnormality.   10-27-16: ct angio of chest; abdomen and pelvis: 1. No CTA evidence for acute aortic dissection or other acute vascular abnormality. 2. Aneurysmal dilatation of the ascending aorta up to 4.7 cm. 3. Mild aneurysmal dilatation of the infrarenal aorta to 3.0 cm. 4. Moderate atheromatous plaque throughout the intra-abdominal aorta and bilateral iliac arteries as above, with diffuse 3 vessel coronary artery calcifications. 5. No other acute abnormality within the chest, abdomen, and pelvis. 6. Large amount of  retained stool within the colon, suggesting constipation. 7. 4.5 cm benign hepatic hemangioma.  10-28-16: mri of head: 1. No acute intracranial abnormality. 2. 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   LABS REVIEWED:   10-27-16: wbc 3.7; hgb 16.6; hct 48.3; mcv 87.3; plt 211; glucose 116; bun 13; creat 0.95; k+ 3.5 ;na++ 137; HIV: nr 10-28-16: wbc 3.5; hgb 15.2; hct 44.8; mcv 87.7; plt 196; glucose 91; bun 8; creat 0.97; k+ 3.1; na++ 139; vit B 12: 279; hgb a1c 6.6; tsh 2.273; chol 164; ldl 110; trig 67; hdl 41 10-29-16: wbc 3.4; hgb 13.8; hct 41.6; mcv 88.5; plt 184; glucose 102; bun 10; creat 1.13; k+ 3.5; na++ 139; mag 1.9    Review of Systems  Constitutional: Negative for malaise/fatigue.  Respiratory: Negative for cough and shortness of breath.   Cardiovascular: Negative for chest pain, palpitations and leg swelling.  Gastrointestinal: Negative for abdominal pain, constipation and heartburn.  Musculoskeletal: Negative for back pain, joint pain and myalgias.  Skin: Negative.   Neurological: Negative for dizziness.  Psychiatric/Behavioral: The patient is not nervous/anxious.     Physical Exam  Constitutional: No distress.  Eyes: Conjunctivae are normal.  Neck: Neck supple. No JVD present. No thyromegaly present.  Cardiovascular: Normal rate, regular rhythm and intact distal pulses.   Respiratory: Effort normal and breath sounds normal. No respiratory distress. He has no wheezes.  GI: Soft. Bowel sounds are normal. He exhibits no distension. There is no tenderness.  Musculoskeletal: He exhibits no edema.  Able to move all extremities  Has lower extremity weakness present   Lymphadenopathy:    He has no cervical adenopathy.  Neurological: He is alert.  Skin: Skin is warm and dry. He is not diaphoretic.  Psychiatric: He has a normal mood and affect.     ASSESSMENT/ PLAN:  Patient is being discharged with the following home health services:  None  required  Patient is being discharged with the following durable medical equipment:  None required   Patient has been advised to f/u with their PCP in 1-2 weeks to bring them up to date on their rehab stay.  Social services at facility was responsible for arranging this appointment.  Pt was provided with a 30 day supply of prescriptions for medications and refills must be obtained from their PCP.  For controlled substances, a more limited supply may be provided adequate until PCP appointment only. #20 ultracet 375./325 mg tabs  Time spent with patient  40  minutes >50% time spent counseling; reviewing medical record; tests; labs; and developing future plan of care    Ok Edwards NP [facility]  Contact [phone] Monday through Friday 8am- 5pm  After hours call [phone]

## 2016-12-19 ENCOUNTER — Inpatient Hospital Stay: Payer: Self-pay

## 2017-03-13 ENCOUNTER — Encounter (HOSPITAL_COMMUNITY): Payer: Self-pay

## 2017-03-13 ENCOUNTER — Emergency Department (HOSPITAL_COMMUNITY)
Admission: EM | Admit: 2017-03-13 | Discharge: 2017-03-13 | Disposition: A | Discharge status: Home / Self Care | Payer: Self-pay | Attending: Emergency Medicine | Admitting: Emergency Medicine

## 2017-03-13 ENCOUNTER — Emergency Department (HOSPITAL_COMMUNITY): Payer: Self-pay

## 2017-03-13 DIAGNOSIS — R6889 Other general symptoms and signs: Secondary | ICD-10-CM

## 2017-03-13 DIAGNOSIS — Z7984 Long term (current) use of oral hypoglycemic drugs: Secondary | ICD-10-CM | POA: Insufficient documentation

## 2017-03-13 DIAGNOSIS — Z79899 Other long term (current) drug therapy: Secondary | ICD-10-CM | POA: Insufficient documentation

## 2017-03-13 DIAGNOSIS — F1410 Cocaine abuse, uncomplicated: Secondary | ICD-10-CM | POA: Insufficient documentation

## 2017-03-13 DIAGNOSIS — E119 Type 2 diabetes mellitus without complications: Secondary | ICD-10-CM | POA: Insufficient documentation

## 2017-03-13 DIAGNOSIS — F17210 Nicotine dependence, cigarettes, uncomplicated: Secondary | ICD-10-CM | POA: Insufficient documentation

## 2017-03-13 DIAGNOSIS — M6281 Muscle weakness (generalized): Secondary | ICD-10-CM | POA: Insufficient documentation

## 2017-03-13 DIAGNOSIS — Z7982 Long term (current) use of aspirin: Secondary | ICD-10-CM | POA: Insufficient documentation

## 2017-03-13 DIAGNOSIS — Z8673 Personal history of transient ischemic attack (TIA), and cerebral infarction without residual deficits: Secondary | ICD-10-CM | POA: Insufficient documentation

## 2017-03-13 DIAGNOSIS — I10 Essential (primary) hypertension: Secondary | ICD-10-CM | POA: Insufficient documentation

## 2017-03-13 DIAGNOSIS — K5900 Constipation, unspecified: Secondary | ICD-10-CM | POA: Insufficient documentation

## 2017-03-13 LAB — URINALYSIS, ROUTINE W REFLEX MICROSCOPIC
Bilirubin Urine: NEGATIVE
Glucose, UA: NEGATIVE mg/dL
HGB URINE DIPSTICK: NEGATIVE
Ketones, ur: 20 mg/dL — AB
LEUKOCYTES UA: NEGATIVE
NITRITE: NEGATIVE
PROTEIN: 100 mg/dL — AB
Specific Gravity, Urine: 1.031 — ABNORMAL HIGH (ref 1.005–1.030)
pH: 5 (ref 5.0–8.0)

## 2017-03-13 LAB — CBG MONITORING, ED: GLUCOSE-CAPILLARY: 82 mg/dL (ref 65–99)

## 2017-03-13 LAB — CBC
HEMATOCRIT: 54 % — AB (ref 39.0–52.0)
HEMOGLOBIN: 18.6 g/dL — AB (ref 13.0–17.0)
MCH: 30 pg (ref 26.0–34.0)
MCHC: 34.4 g/dL (ref 30.0–36.0)
MCV: 87.1 fL (ref 78.0–100.0)
Platelets: 168 10*3/uL (ref 150–400)
RBC: 6.2 MIL/uL — ABNORMAL HIGH (ref 4.22–5.81)
RDW: 12.7 % (ref 11.5–15.5)
WBC: 4 10*3/uL (ref 4.0–10.5)

## 2017-03-13 LAB — BASIC METABOLIC PANEL
ANION GAP: 13 (ref 5–15)
BUN: 18 mg/dL (ref 6–20)
CO2: 22 mmol/L (ref 22–32)
Calcium: 9.4 mg/dL (ref 8.9–10.3)
Chloride: 105 mmol/L (ref 101–111)
Creatinine, Ser: 1.23 mg/dL (ref 0.61–1.24)
GFR calc Af Amer: >60 mL/min (ref >60)
Glucose, Bld: 74 mg/dL (ref 65–99)
POTASSIUM: 4 mmol/L (ref 3.5–5.1)
SODIUM: 140 mmol/L (ref 135–145)

## 2017-03-13 LAB — CK: Total CK: 106 U/L (ref 49–397)

## 2017-03-13 MED ORDER — HYDRALAZINE HCL 10 MG PO TABS: 10.0000 mg | ORAL_TABLET | Freq: Four times a day (QID) | ORAL | 0 refills | Status: DC

## 2017-03-13 MED ORDER — HYDRALAZINE HCL 50 MG PO TABS
50.0000 mg | ORAL_TABLET | Freq: Once | ORAL | Status: AC
  Administered 2017-03-13: 50 mg via ORAL
  Filled 2017-03-13: qty 1

## 2017-03-13 MED ORDER — AMLODIPINE BESYLATE 10 MG PO TABS: 10.0000 mg | ORAL_TABLET | Freq: Every day | ORAL | 0 refills | Status: DC

## 2017-03-13 MED ORDER — AMLODIPINE BESYLATE 5 MG PO TABS
10.0000 mg | ORAL_TABLET | Freq: Once | ORAL | Status: DC
  Filled 2017-03-13: qty 2

## 2017-03-13 NOTE — Discharge Instructions (Signed)
You will need to [facility]. I strongly advise you to re-start your blood pressure medications.  It is very important to keep this controlled. You can return here for any new/worsening symptoms.

## 2017-03-13 NOTE — ED Notes (Signed)
Patient transported to X-ray 

## 2017-03-13 NOTE — ED Triage Notes (Signed)
Pt presents for evaluation of generalized weakness x 1 week. Pt with hx of CVA, R sided deficits. Pt reports has not been eating or had appetite since last week. CBG 75 per PTAR. Pt interactive, AxO x4.

## 2017-03-13 NOTE — ED Notes (Addendum)
Gave pt meal ?

## 2017-03-13 NOTE — ED Provider Notes (Signed)
[facility] DEPT Provider Note   CSN: [id number] Arrival date & time: 03/13/17  1026     History   Chief Complaint Chief Complaint  Patient presents with  . Weakness    HPI Javier Rivera is a 56 y.o. male.  The history is provided by the patient and medical records.    56 year old male with history of diabetes, hypertension, renal insufficiency, prior stroke with some residual right-sided deficits, history of slow transit constipation, presenting to the ED with various complaints.  Notably patient is complaining of generalized weakness. States he feels this is secondary to his issues with his "colon". Patient has a long-standing history of constipation and has very small bowel movements (states they look like small balls). States when he tries to eat he feels very full quickly and does not feel he is getting adequate nutrition. States he feels this is making him feel weak. He has lost some weight over the past year.  He has not had any falls or syncopal events. He does admit that his urine has been dark in color for several months now.  He denies dysuria, hematuria, or flank pain.  states he feels he urinates a normal amount based on what he drinks. He has not had any fever or chills. Lastly, patient reports some generalized muscle tension and cramping of his hands.  States this comes and goes, does not seem provoked by anything in particular.  Patient is hypertensive here, states he has not had his medications in a few weeks. States he was previously at a rehabilitation center following his prior stroke, now he is currently staying with friends when possible,  Past Medical History:  Diagnosis Date  . Diabetes mellitus without complication ([place])   . Hypertension   . Renal disorder    pt stated that masses were found on his kidneys  . Renal insufficiency   . Stroke [facility])     Patient Active Problem List   Diagnosis Date Noted  . FTT (failure to thrive) in adult 10/29/2016  . Right  hip pain 10/29/2016  . Weakness 10/27/2016  . Fall 10/27/2016  . Thoracic aortic aneurysm without rupture ([place])   . Chest pain   . Slow transit constipation 11/25/2015  . Right sided weakness 10/17/2015  . Uncontrolled hypertension 10/17/2015  . Cerebral infarction ([place]) 10/17/2015  . Sensory disturbance 04/25/2015  . Ascending aorta dilatation (H[facility])   . Protein-calorie malnutrition, severe ([place]) 04/24/2015  . Heart block AV second degree 04/23/2015  . Dizziness 04/23/2015  . Diabetes mellitus type 2, controlled ([place]) 04/23/2015  . Essential hypertension 04/23/2015  . Tobacco abuse 04/23/2015  . SEBORRHEIC DERMATITIS 05/07/2010  . RECTAL BLEEDING 08/14/2009  . Hyperlipidemia 06/11/2009  . Cocaine abuse 06/11/2009  . RENAL INSUFFICIENCY 06/11/2009    Past Surgical History:  Procedure Laterality Date  . KNEE SURGERY     bilateral       Home Medications    Prior to Admission medications   Medication Sig Start Date End Date Taking? Authorizing Provider  amLODipine (NORVASC) 10 MG tablet Take 1 tablet (10 mg total) by mouth daily. 03/29/16   Velna Ochs, MD  aspirin EC 81 MG tablet Take 1 tablet (81 mg total) by mouth daily. 03/29/16   Velna Ochs, MD  atorvastatin (LIPITOR) 40 MG tablet Take 1 tablet (40 mg total) by mouth daily at 6 PM. 03/29/16   Velna Ochs, MD  gabapentin (NEURONTIN) 100 MG capsule Take 100 mg by mouth 3 (three) times daily.  [provider]  glipiZIDE (GLUCOTROL) 10 MG tablet Take 1 tablet (10 mg total) by mouth daily before breakfast. 03/29/16   Velna Ochs, MD  hydrALAZINE (APRESOLINE) 50 MG tablet Take 50 mg by mouth 4 (four) times daily.    [provider]  ondansetron (ZOFRAN) 4 MG tablet Take 1 tablet (4 mg total) by mouth every 8 (eight) hours as needed for nausea or vomiting. 03/31/16   Verner Mould, MD  polyethylene glycol [facility] / Floria Raveling) packet Take 17 g by mouth daily as needed for  moderate constipation. 10/29/16   Dhungel, Flonnie Overman, MD  sennosides-docusate sodium (SENOKOT-S) 8.6-50 MG tablet Take 2 tablets by mouth 2 (two) times daily.    [provider]  sertraline (ZOLOFT) 50 MG tablet Take 50 mg by mouth daily.    [provider]  traMADol-acetaminophen (ULTRACET) 37.5-325 MG tablet Take 1 tablet by mouth every 6 (six) hours as needed. 10/29/16   Dhungel, Flonnie Overman, MD    Family History Family History  Problem Relation Age of Onset  . Diabetes Mellitus II Mother   . Hypertension Mother   . Diabetes Mellitus II Sister   . Hypertension Sister     Social History Social History  Substance Use Topics  . Smoking status: Light Tobacco Smoker    Packs/day: 0.25    Years: 20.00    Types: Cigarettes  . Smokeless tobacco: Never Used  . Alcohol use No     Allergies   Clonidine derivatives and Lisinopril   Review of Systems Review of Systems  Genitourinary:       Dark urine  Musculoskeletal: Positive for myalgias (cramping).  Neurological: Positive for weakness (generalized).  All other systems reviewed and are negative.    Physical Exam Updated Vital Signs BP (!) 173/122   Pulse 72   Temp 98.2 F (36.8 C) (Oral)   Resp 17   SpO2 95%   Physical Exam  Constitutional: He is oriented to person, place, and time. He appears well-developed and well-nourished.  HENT:  Head: Normocephalic and atraumatic.  Mouth/Throat: Oropharynx is clear and moist.  Mucous membranes remain moist  Eyes: Pupils are equal, round, and reactive to light. Conjunctivae and EOM are normal.  Neck: Normal range of motion.  Cardiovascular: Normal rate, regular rhythm and normal heart sounds.   Pulmonary/Chest: Effort normal and breath sounds normal. No respiratory distress. He has no wheezes. He has no rhonchi.  Abdominal: Soft. Bowel sounds are normal. There is no tenderness. There is no rigidity and no rebound.  Musculoskeletal: Normal range of motion.    Neurological: He is alert and oriented to person, place, and time.  AAOx3, mild weakness of right arm and leg at baseline, left upper and lower extremities with normal strength, ambulates with cane (baseline)  Skin: Skin is warm and dry.  Psychiatric: He has a normal mood and affect.  Nursing note and vitals reviewed.    ED Treatments / Results  Labs (all labs ordered are listed, but only abnormal results are displayed) Labs Reviewed  CBC - Abnormal; Notable for the following:       Result Value   RBC 6.20 (*)    Hemoglobin 18.6 (*)    HCT 54.0 (*)    All other components within normal limits  URINALYSIS, ROUTINE W REFLEX MICROSCOPIC - Abnormal; Notable for the following:    Color, Urine AMBER (*)    APPearance HAZY (*)    Specific Gravity, Urine 1.031 (*)    Ketones,  ur 20 (*)    Protein, ur 100 (*)    Bacteria, UA RARE (*)    Squamous Epithelial / LPF 0-5 (*)    All other components within normal limits  BASIC METABOLIC PANEL  CK  CBG MONITORING, ED    EKG  EKG Interpretation None       Radiology Dg Abd Acute W/chest  Result Date: 03/13/2017 CLINICAL DATA:  Generalized weakness for 1 week, anorexia, history hypertension, diabetes mellitus EXAM: DG ABDOMEN ACUTE W/ 1V CHEST COMPARISON:  Chest radiograph 03/29/2016, abdominal radiographs 09/16/2015 FINDINGS: Normal heart size and pulmonary vascularity. Calcified tortuous thoracic aorta. Probable BILATERAL nipple shadows, unchanged from earlier 2017 exams. No pulmonary infiltrate, pleural effusion or pneumothorax. Nonobstructive bowel gas pattern. No bowel dilatation, bowel wall thickening or free intraperitoneal air. Diffuse osseous demineralization. No urinary tract calcification. IMPRESSION: No acute abnormalities. Aortic Atherosclerosis (ICD10-I70.0). Electronically Signed   By: Lavonia Dana M.D.   On: 03/13/2017 14:33    Procedures Procedures (including critical care time)  Medications Ordered in ED Medications   amLODipine (NORVASC) tablet 10 mg (10 mg Oral Not Given 03/13/17 1454)  hydrALAZINE (APRESOLINE) tablet 50 mg (50 mg Oral Given 03/13/17 1457)     Initial Impression / Assessment and Plan / ED Course  I have reviewed the triage vital signs and the nursing notes.  Pertinent labs & imaging results that were available during my care of the patient were reviewed by me and considered in my medical decision making (see chart for details).  56 year old male here with various complaints.  1.  Generalized weakness-- vague in nature. He has chronic mild right-sided weakness from prior stroke, no new deficits noted. AAOx3.  Remains ambulatory with cane.    2.  Constipation-- chronic issue secondary to slow transit based on chart review. Abdomen is soft and benign here. He has normal bowel sounds. Acute abdominal series without evidence of obstruction or free air. Can continue stool softeners.  Drinking fluids here without issue.  3.  Dark urine-- states has been brown in color for several months. He denies any fever, flank pain, dysuria, or hematuria. UA here is amber in color with small amount of ketones noted. Rare bacteria. No signs or symptoms concerning for pyelonephritis or acute stone. SrCr is WNL.  Can follow-up with PCP or urology.  Recommend increase free water intake.  4.  Generalized aches and pains-- very vague in nature and non-specific. Labs overall reassuring without noted electrolyte imbalance.  Given discolored urine, CK was added which was normal at 106.  Of note, patient's blood pressure elevated here. I've ordered his home medications, however he refused the Norvasc as he states he was switched to something else but cannot recall what it was or the dose. Based on chart review, he was still on amlodipine as late as May 2018, no medication changes noted and no PCP visits since then. Patient did ultimately take the hydralazine.  He has no signs/symptoms concerning for end organ damage today,  specifically no chest pain, SOB, headache, or focal deficits.  Will need close follow-up for all of this complaints as BP has been high like this at multiple prior ED visits.  Was previously seen at [facility] so will refer back there.  Patient currently homeless/staying with friends as he does not have anywhere else to go, discussed with social work-- since he used his stay at rehab with insurance after his stroke, cannot be placed at this time.    Final  Clinical Impressions(s) / ED Diagnoses   Final diagnoses:  Multiple complaints    New Prescriptions New Prescriptions   AMLODIPINE (NORVASC) 10 MG TABLET    Take 1 tablet (10 mg total) by mouth daily.   HYDRALAZINE (APRESOLINE) 10 MG TABLET    Take 1 tablet (10 mg total) by mouth 4 (four) times daily.     Larene Pickett, PA-C 03/13/17 1546    Malvin Johns, MD 03/14/17 [phone]

## 2017-04-23 ENCOUNTER — Emergency Department (HOSPITAL_COMMUNITY)
Admission: EM | Admit: 2017-04-23 | Discharge: 2017-04-23 | Disposition: A | Discharge status: Home / Self Care | Payer: Self-pay | Attending: Emergency Medicine | Admitting: Emergency Medicine

## 2017-04-23 ENCOUNTER — Encounter (HOSPITAL_COMMUNITY): Payer: Self-pay | Admitting: *Deleted

## 2017-04-23 ENCOUNTER — Emergency Department (HOSPITAL_COMMUNITY): Payer: Self-pay

## 2017-04-23 DIAGNOSIS — F17210 Nicotine dependence, cigarettes, uncomplicated: Secondary | ICD-10-CM | POA: Insufficient documentation

## 2017-04-23 DIAGNOSIS — E119 Type 2 diabetes mellitus without complications: Secondary | ICD-10-CM | POA: Insufficient documentation

## 2017-04-23 DIAGNOSIS — R079 Chest pain, unspecified: Secondary | ICD-10-CM

## 2017-04-23 DIAGNOSIS — I10 Essential (primary) hypertension: Secondary | ICD-10-CM | POA: Insufficient documentation

## 2017-04-23 DIAGNOSIS — R0789 Other chest pain: Secondary | ICD-10-CM | POA: Insufficient documentation

## 2017-04-23 LAB — BASIC METABOLIC PANEL
Anion gap: 9 (ref 5–15)
BUN: 6 mg/dL (ref 6–20)
CHLORIDE: 101 mmol/L (ref 101–111)
CO2: 26 mmol/L (ref 22–32)
CREATININE: 1.01 mg/dL (ref 0.61–1.24)
Calcium: 8.7 mg/dL — ABNORMAL LOW (ref 8.9–10.3)
GFR calc non Af Amer: >60 mL/min (ref >60)
Glucose, Bld: 102 mg/dL — ABNORMAL HIGH (ref 65–99)
POTASSIUM: 3.4 mmol/L — AB (ref 3.5–5.1)
Sodium: 136 mmol/L (ref 135–145)

## 2017-04-23 LAB — URINALYSIS, ROUTINE W REFLEX MICROSCOPIC
Bacteria, UA: NONE SEEN
Bilirubin Urine: NEGATIVE
Glucose, UA: NEGATIVE mg/dL
Hgb urine dipstick: NEGATIVE
Ketones, ur: 20 mg/dL — AB
Nitrite: POSITIVE — AB
PH: 5 (ref 5.0–8.0)
PROTEIN: 30 mg/dL — AB
SPECIFIC GRAVITY, URINE: 1.034 — AB (ref 1.005–1.030)

## 2017-04-23 LAB — I-STAT TROPONIN, ED
Troponin i, poc: 0.01 ng/mL (ref 0.00–0.08)
Troponin i, poc: 0 ng/mL (ref 0.00–0.08)

## 2017-04-23 LAB — CBC
HEMATOCRIT: 49.5 % (ref 39.0–52.0)
HEMOGLOBIN: 16.4 g/dL (ref 13.0–17.0)
MCH: 28.9 pg (ref 26.0–34.0)
MCHC: 33.1 g/dL (ref 30.0–36.0)
MCV: 87.3 fL (ref 78.0–100.0)
PLATELETS: 176 10*3/uL (ref 150–400)
RBC: 5.67 MIL/uL (ref 4.22–5.81)
RDW: 13.6 % (ref 11.5–15.5)
WBC: 4.2 10*3/uL (ref 4.0–10.5)

## 2017-04-23 LAB — RAPID URINE DRUG SCREEN, HOSP PERFORMED
Amphetamines: NOT DETECTED
Barbiturates: NOT DETECTED
Benzodiazepines: NOT DETECTED
COCAINE: NOT DETECTED
OPIATES: POSITIVE — AB
Tetrahydrocannabinol: NOT DETECTED

## 2017-04-23 MED ORDER — ONDANSETRON HCL 4 MG/2ML IJ SOLN
4.0000 mg | Freq: Once | INTRAMUSCULAR | Status: AC
  Administered 2017-04-23: 4 mg via INTRAVENOUS
  Filled 2017-04-23: qty 2

## 2017-04-23 MED ORDER — AMLODIPINE BESYLATE 5 MG PO TABS
10.0000 mg | ORAL_TABLET | Freq: Once | ORAL | Status: AC
  Administered 2017-04-23: 10 mg via ORAL
  Filled 2017-04-23: qty 2

## 2017-04-23 MED ORDER — IOPAMIDOL (ISOVUE-370) INJECTION 76%
INTRAVENOUS | Status: AC
Start: 2017-04-23 — End: 2017-04-23
  Administered 2017-04-23: 100 mL
  Filled 2017-04-23: qty 100

## 2017-04-23 MED ORDER — HYDRALAZINE HCL 10 MG PO TABS
10.0000 mg | ORAL_TABLET | Freq: Once | ORAL | Status: AC
  Administered 2017-04-23: 10 mg via ORAL
  Filled 2017-04-23: qty 1

## 2017-04-23 MED ORDER — MORPHINE SULFATE (PF) 4 MG/ML IV SOLN
4.0000 mg | Freq: Once | INTRAVENOUS | Status: AC
  Administered 2017-04-23: 4 mg via INTRAVENOUS
  Filled 2017-04-23: qty 1

## 2017-04-23 NOTE — ED Provider Notes (Signed)
[place] DEPT Provider Note   CSN: [id number] Arrival date & time: 04/23/17  5361     History   Chief Complaint Chief Complaint  Patient presents with  . Chest Pain    HPI Javier Rivera is a 56 y.o. male.  HPI   Javier Rivera is a 56 y.o. male, with a history of DM, HTN, thoracic aortic aneurysm, and stroke, presenting to the ED with chest pain beginning three days ago.  Chest pain is on the right chest, radiates to left chest and back, described as a tightness/aching, waxes and wanes, currently 8/10. Nothing makes it worse or better.  Endorses shortness of breath intermittent for last few months, "feels like I can't catch my breath." Also endorses nausea, intermittent nonproductive cough, and constipation. Last BM about 4 days ago.  Patient received 324mg  ASA and 1 NTG with EMS prior to arrival. He does not know if this helped. Patient denies alcohol or illicit drug use.  He has history of stroke that left him with memory deficits and right sided weakness.  Denies diarrhea, fever/chills, abdominal pain, urinary complaints, neuro deficits, diaphoresis, falls/trauma, dizziness, or any other complaints.  Denies history of DVT/PE, recent immobilization, surgery, trauma. Patient does, however, have a known thoracic aortic aneurysm.   Past Medical History:  Diagnosis Date  . Diabetes mellitus without complication ([place])   . Hypertension   . Renal disorder    pt stated that masses were found on his kidneys  . Renal insufficiency   . Stroke [facility])    r sided weakness, memory    Patient Active Problem List   Diagnosis Date Noted  . FTT (failure to thrive) in adult 10/29/2016  . Right hip pain 10/29/2016  . Weakness 10/27/2016  . Fall 10/27/2016  . Thoracic aortic aneurysm without rupture ([place])   . Chest pain   . Slow transit constipation 11/25/2015  . Right sided weakness 10/17/2015  . Uncontrolled hypertension 10/17/2015  . Cerebral infarction ([place]) 10/17/2015  .  Sensory disturbance 04/25/2015  . Ascending aorta dilatation ([facility])   . Protein-calorie malnutrition, severe ([place]) 04/24/2015  . Heart block AV second degree 04/23/2015  . Dizziness 04/23/2015  . Diabetes mellitus type 2, controlled ([place]) 04/23/2015  . Essential hypertension 04/23/2015  . Tobacco abuse 04/23/2015  . SEBORRHEIC DERMATITIS 05/07/2010  . RECTAL BLEEDING 08/14/2009  . Hyperlipidemia 06/11/2009  . Cocaine abuse 06/11/2009  . RENAL INSUFFICIENCY 06/11/2009    Past Surgical History:  Procedure Laterality Date  . KNEE SURGERY     bilateral       Home Medications    Prior to Admission medications   Medication Sig Start Date End Date Taking? Authorizing Provider  amLODipine (NORVASC) 10 MG tablet Take 1 tablet (10 mg total) by mouth daily. Patient not taking: Reported on 04/23/2017 03/13/17   Larene Pickett, PA-C  aspirin EC 81 MG tablet Take 1 tablet (81 mg total) by mouth daily. Patient not taking: Reported on 04/23/2017 03/29/16   Velna Ochs, MD  atorvastatin (LIPITOR) 40 MG tablet Take 1 tablet (40 mg total) by mouth daily at 6 PM. Patient not taking: Reported on 04/23/2017 03/29/16   Velna Ochs, MD  glipiZIDE (GLUCOTROL) 10 MG tablet Take 1 tablet (10 mg total) by mouth daily before breakfast. Patient not taking: Reported on 04/23/2017 03/29/16   Velna Ochs, MD  hydrALAZINE (APRESOLINE) 10 MG tablet Take 1 tablet (10 mg total) by mouth 4 (four) times daily. Patient not taking: Reported on 04/23/2017 03/13/17  Larene Pickett, PA-C  ondansetron (ZOFRAN) 4 MG tablet Take 1 tablet (4 mg total) by mouth every 8 (eight) hours as needed for nausea or vomiting. Patient not taking: Reported on 04/23/2017 03/31/16   Verner Mould, MD  polyethylene glycol [facility] / Floria Raveling) packet Take 17 g by mouth daily as needed for moderate constipation. Patient not taking: Reported on 04/23/2017 10/29/16   Dhungel, Flonnie Overman, MD  traMADol-acetaminophen (ULTRACET)  37.5-325 MG tablet Take 1 tablet by mouth every 6 (six) hours as needed. Patient not taking: Reported on 04/23/2017 10/29/16   Louellen Molder, MD    Family History Family History  Problem Relation Age of Onset  . Diabetes Mellitus II Mother   . Hypertension Mother   . Diabetes Mellitus II Sister   . Hypertension Sister     Social History Social History  Substance Use Topics  . Smoking status: Light Tobacco Smoker    Packs/day: 0.25    Years: 20.00    Types: Cigarettes  . Smokeless tobacco: Never Used  . Alcohol use No     Allergies   Clonidine derivatives and Lisinopril   Review of Systems Review of Systems  Constitutional: Negative for chills, diaphoresis, fatigue and fever.  Respiratory: Positive for cough and shortness of breath.   Cardiovascular: Positive for chest pain. Negative for leg swelling.  Gastrointestinal: Positive for constipation and nausea. Negative for abdominal pain, blood in stool, diarrhea and vomiting.  Genitourinary: Negative for dysuria, frequency and hematuria.  Neurological: Negative for dizziness, syncope, weakness, light-headedness and numbness.  All other systems reviewed and are negative.    Physical Exam Updated Vital Signs Temp 98.3 F (36.8 C) (Oral)   Ht 6\' 8"  (2.032 m)   Wt 86.2 kg (190 lb)   BMI 20.87 kg/m   Physical Exam  Constitutional: He appears well-developed. No distress.  Patient appears somewhat unkempt.  HENT:  Head: Normocephalic and atraumatic.  Eyes: Conjunctivae are normal.  Neck: Neck supple.  Cardiovascular: Normal rate, regular rhythm, normal heart sounds and intact distal pulses.   Pulses:      Radial pulses are 2+ on the right side, and 2+ on the left side.  Pulmonary/Chest: Effort normal and breath sounds normal. Tachypnea noted. He exhibits no tenderness.  Patient shows no increased work of breathing. Speaks full sentences without noted difficulty.  Abdominal: Soft. There is no tenderness. There is  no guarding.  Musculoskeletal: He exhibits no edema.  Lymphadenopathy:    He has no cervical adenopathy.  Neurological: He is alert.  Skin: Skin is warm and dry. He is not diaphoretic.  Psychiatric: He has a normal mood and affect. His behavior is normal.  Nursing note and vitals reviewed.    ED Treatments / Results  Labs (all labs ordered are listed, but only abnormal results are displayed) Labs Reviewed  BASIC METABOLIC PANEL - Abnormal; Notable for the following:       Result Value   Potassium 3.4 (*)    Glucose, Bld 102 (*)    Calcium 8.7 (*)    All other components within normal limits  URINALYSIS, ROUTINE W REFLEX MICROSCOPIC - Abnormal; Notable for the following:    Color, Urine AMBER (*)    APPearance HAZY (*)    Specific Gravity, Urine 1.034 (*)    Ketones, ur 20 (*)    Protein, ur 30 (*)    Nitrite POSITIVE (*)    Leukocytes, UA SMALL (*)    Squamous Epithelial / LPF 0-5 (*)  All other components within normal limits  RAPID URINE DRUG SCREEN, HOSP PERFORMED - Abnormal; Notable for the following:    Opiates POSITIVE (*)    All other components within normal limits  CBC  I-STAT TROPONIN, ED  I-STAT TROPONIN, ED    EKG  EKG Interpretation  Date/Time:  Sunday April 23 2017 08:35:06 EDT Ventricular Rate:  67 PR Interval:  218 QRS Duration: 94 QT Interval:  400 QTC Calculation: 422 R Axis:   -26 Text Interpretation:  Sinus rhythm with 1st degree A-V block Septal infarct , age undetermined Abnormal ECG No significant change since last tracing Confirmed by Addison Lank [phone]) on 04/23/2017 8:44:06 AM       Radiology Dg Chest 2 View  Result Date: 04/23/2017 CLINICAL DATA:  Chest pain and shortness of breath EXAM: CHEST  2 VIEW COMPARISON:  March 13, 2017 FINDINGS: The heart, hila, and mediastinum are normal. Stable tortuous thoracic aorta. No pneumothorax. Nipple shadows again identified. No suspicious nodules or masses. No focal infiltrates. No other  acute abnormalities identified. IMPRESSION: No active cardiopulmonary disease. Electronically Signed   By: Dorise Bullion III M.D   On: 04/23/2017 09:38   Ct Angio Chest Pe W And/or Wo Contrast  Result Date: 04/23/2017 CLINICAL DATA:  Shortness of breath with chest and back pain over the last few months. EXAM: CT ANGIOGRAPHY CHEST WITH CONTRAST TECHNIQUE: Multidetector CT imaging of the chest was performed using the standard protocol during bolus administration of intravenous contrast. Multiplanar CT image reconstructions and MIPs were obtained to evaluate the vascular anatomy. CONTRAST:  100 cc Isovue 370 COMPARISON:  Chest CT angiogram from 10/27/2016 FINDINGS: Cardiovascular: No filling defect is identified in the pulmonary arterial tree to suggest pulmonary embolus. Stable 4.5 cm in diameter ascending thoracic aortic aneurysm. Coronary, aortic arch, and branch vessel atherosclerotic vascular disease. Please note that timing for today' s exam was optimized to assess the pulmonary arteries and not the systemic arterial vasculature. 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 Low-density masses in the adrenal glands, stable. Stable appearance of renal cystic lesions are included. Prominence of upper abdominal colonic stool. Musculoskeletal: Mild thoracic spondylosis. Review of the MIP images confirms the above findings. IMPRESSION: 1. No filling defect is identified in the pulmonary arterial tree to suggest pulmonary embolus. 2. Stable 4.5 cm in diameter ascending thoracic aortic aneurysm. Ascending thoracic aortic aneurysm. Recommend semi-annual imaging followup by CTA or MRA and referral to cardiothoracic surgery if not already obtained. This recommendation follows 2010 ACCF/AHA/AATS/ACR/ASA/SCA/SCAI/SIR/STS/SVM Guidelines for the Diagnosis and Management of Patients With  Thoracic Aortic Disease. Circulation. 2010; 121: I458-K998 3. Prominence of upper abdominal colonic stool, possible constipation. 4. 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 Bilateral adrenal adenomas. Renal cysts. Thoracic spondylosis. Aortic Atherosclerosis (ICD10-I70.0). Coronary atherosclerosis. Electronically Signed   By: Van Clines M.D.   On: 04/23/2017 13:36    Procedures Procedures (including critical care time)  Medications Ordered in ED Medications  ondansetron (ZOFRAN) injection 4 mg (4 mg Intravenous Given 04/23/17 1131)  morphine 4 MG/ML injection 4 mg (4 mg Intravenous Given 04/23/17 1131)  iopamidol (ISOVUE-370) 76 % injection (100 mLs  Contrast Given 04/23/17 1308)  hydrALAZINE (APRESOLINE) tablet 10 mg (10 mg Oral Given 04/23/17 1505)  amLODipine (NORVASC) tablet 10 mg (10 mg Oral Given 04/23/17 1408)     Initial Impression / Assessment and Plan /  ED Course  I have reviewed the triage vital signs and the nursing notes.  Pertinent labs & imaging results that were available during my care of the patient were reviewed by me and considered in my medical decision making (see chart for details).  Clinical Course as of Apr 23 1758  Sun Apr 23, 2017  1356 Patient updated on lab and CT results. Chest pain has resolved. Complains of generalized headache, moderate, nonradiating. Requests something to eat.  [SJ]  1500 Patient states his headache has resolved. No recurrence in patient's chest pain or shortness of breath.  [SJ]    Clinical Course User Index [SJ] Maggi Hershkowitz C, PA-C    Patient presents with complaint of chest pain and shortness of breath. Low suspicion for ACS. HEART score is 3, indicating low risk for a cardiac event. Wells criteria score is 3, indicating moderate risk for PE.  No sign of PE on CT. Low suspicion for thoracic aortic aneurysm dissection. No change in aneurysm on CT.  Low suspicion for  sources of chest pain such as endocarditis.  Patient noted to be hypertensive. This was addressed with doses of the patient's home medications, with which he has not been compliant. Patient admits to being noncompliant for at least a month. Patient states that he has the medications, he just does not want to take them because they make him "feel funny." Abnormalities noted on the patient's UA. Denies any symptoms of urinary discomfort, hematuria, abdominal pain, penile discharge, or other similar complaints. Ketones noted. IV fluids are recommended to the patient with explanation. Patient declined. Opted for oral hydration.  Recommended PCP follow-up as soon as possible on these matters. The patient was given instructions for home care as well as return precautions. Patient voices understanding of these instructions, accepts the plan, and is comfortable with discharge.  Findings and plan of care discussed with Addison Lank, MD.   Vitals:   04/23/17 0930 04/23/17 0945 04/23/17 1000 04/23/17 1015  BP: (!) 164/121 (!) 158/115 (!) 168/110 (!) 167/110  Pulse: 66 (!) 49 65 67  Resp:      Temp:      TempSrc:      SpO2: 99% 96% 99% 92%  Weight:      Height:       Vitals:   04/23/17 1445 04/23/17 1500 04/23/17 1515 04/23/17 1621  BP: (!) 185/124 (!) 182/120 (!) 179/111   Pulse:  61 72   Resp: 13 12 15    Temp:    98.3 F (36.8 C)  TempSrc:    Oral  SpO2:  97% 100%   Weight:      Height:          Final Clinical Impressions(s) / ED Diagnoses   Final diagnoses:  Chest pain, unspecified type    New Prescriptions Discharge Medication List as of 04/23/2017  3:48 PM       Lorayne Bender, PA-C 04/23/17 1805    Fatima Blank, MD 04/25/17 [phone]

## 2017-04-23 NOTE — ED Triage Notes (Signed)
Pt to ED via [facility] c/o chest pain and sob x 2 days.  Pain 10/10 and radiates from L to R.  Hx of htn and dm, but does not have money for meds.  Vs 160/119, hr 84, rr24, 98% ra.  18G L ac.  324 asa and 1 nitro given with no relief. ekg nsr.

## 2017-04-23 NOTE — Discharge Instructions (Signed)
You have been seen today for chest pain. This has been evaluated and no acute abnormalities were found.  Your blood pressure was noted to be high today. This needs to be evaluated and addressed by your primary care provider. We recommend taking your prescribed medications.  Return to the ED as needed.

## 2017-06-01 ENCOUNTER — Encounter: Payer: Self-pay | Admitting: Cardiothoracic Surgery

## 2017-10-30 ENCOUNTER — Ambulatory Visit (INDEPENDENT_AMBULATORY_CARE_PROVIDER_SITE_OTHER): Payer: Self-pay | Admitting: Family Medicine

## 2017-10-30 ENCOUNTER — Encounter: Payer: Self-pay | Admitting: Family Medicine

## 2017-10-30 VITALS — BP 150/100 | HR 67 | Temp 97.9°F | Resp 14 | Ht >= 80 in | Wt 225.0 lb

## 2017-10-30 DIAGNOSIS — R5383 Other fatigue: Secondary | ICD-10-CM

## 2017-10-30 DIAGNOSIS — G629 Polyneuropathy, unspecified: Secondary | ICD-10-CM

## 2017-10-30 DIAGNOSIS — R531 Weakness: Secondary | ICD-10-CM

## 2017-10-30 DIAGNOSIS — E785 Hyperlipidemia, unspecified: Secondary | ICD-10-CM

## 2017-10-30 DIAGNOSIS — I6930 Unspecified sequelae of cerebral infarction: Secondary | ICD-10-CM

## 2017-10-30 DIAGNOSIS — E118 Type 2 diabetes mellitus with unspecified complications: Secondary | ICD-10-CM

## 2017-10-30 DIAGNOSIS — I10 Essential (primary) hypertension: Secondary | ICD-10-CM

## 2017-10-30 DIAGNOSIS — K5900 Constipation, unspecified: Secondary | ICD-10-CM

## 2017-10-30 DIAGNOSIS — N390 Urinary tract infection, site not specified: Secondary | ICD-10-CM

## 2017-10-30 LAB — POCT URINALYSIS DIP (DEVICE)
BILIRUBIN URINE: NEGATIVE
Glucose, UA: NEGATIVE mg/dL
HGB URINE DIPSTICK: NEGATIVE
Ketones, ur: NEGATIVE mg/dL
NITRITE: POSITIVE — AB
PH: 5.5 (ref 5.0–8.0)
Protein, ur: 30 mg/dL — AB
Urobilinogen, UA: 1 mg/dL (ref 0.0–1.0)

## 2017-10-30 LAB — POCT GLYCOSYLATED HEMOGLOBIN (HGB A1C): Hemoglobin A1C: 6.4

## 2017-10-30 MED ORDER — ATORVASTATIN CALCIUM 40 MG PO TABS: 40.0000 mg | ORAL_TABLET | Freq: Every day | ORAL | 2 refills | Status: DC

## 2017-10-30 MED ORDER — GABAPENTIN 300 MG PO CAPS: 300.0000 mg | ORAL_CAPSULE | Freq: Three times a day (TID) | ORAL | 3 refills | Status: DC

## 2017-10-30 MED ORDER — AMLODIPINE BESYLATE 10 MG PO TABS: 10.0000 mg | ORAL_TABLET | Freq: Every day | ORAL | 1 refills | Status: DC

## 2017-10-30 MED ORDER — ASPIRIN EC 81 MG PO TBEC: 81.0000 mg | DELAYED_RELEASE_TABLET | Freq: Every day | ORAL | 2 refills | Status: DC

## 2017-10-30 MED ORDER — SULFAMETHOXAZOLE-TRIMETHOPRIM 800-160 MG PO TABS: 1.0000 | ORAL_TABLET | Freq: Two times a day (BID) | ORAL | 0 refills | Status: DC

## 2017-10-30 MED FILL — ATORVASTATIN 40 MG TABLET: 40 | 30 days supply | Qty: 30 | Fill #0

## 2017-10-30 MED FILL — GABAPENTIN 300 MG CAPSULE: 300 | 30 days supply | Qty: 90 | Fill #0

## 2017-10-30 MED FILL — SULFAMETHOXAZOLE-TMP DS TAB: 800-160 | 10 days supply | Qty: 20 | Fill #0

## 2017-10-30 MED FILL — AMLODIPINE BESYLATE 10 MG T: 10 | 30 days supply | Qty: 30 | Fill #0

## 2017-10-30 NOTE — Progress Notes (Signed)
Subjective:    Patient ID: Javier Rivera, male    DOB: [birth year], 57 y.o.   MRN: [id number]  HPI A 57 year old male presents accompanied by Javier Rivera to establish care. Patient has been lost to follow up over the past several years and has been out of medications for chronic conditions. Patient was last evaluated in the ER 6 months ago. At that time, chronic medications were refilled. Patient says that he was taking medications consistently before running out.   Javier Rivera has a history of type 2 diabetes mellitus, CVA with right side weakness, hypertension, and chronic kidney disease. Also, patient is a former smoker. Patient says that he resides at a shelter and does not have control over meals. He attempts to make good food choices.   Patient has been out of antidiabetic medications over the past several months.Patient denies increase appetite, nausea, polydipsia, polyuria, visual disturbances, vomitting and weight loss. He endorses neuropathy. Patient does not check blood sugars. He does not have a glucometer.  Patient is here for evaluation of hypertension. Patient reports that previous CVA was due to uncontrolled hypertension. Patient denies chest pain, dyspnea, lower extremity edema and near-syncope.  Cardiovascular risk factors: diabetes mellitus and dyslipidemia.  Patient reports difficulty with memory due to history of CVA. He also endorses right side weakness. He says that he was followed by neurology years ago, but does not recall details. Past Medical History:  Diagnosis Date  . Diabetes mellitus without complication ([place])   . Hypertension   . Renal disorder    pt stated that masses were found on his kidneys  . Renal insufficiency   . Stroke [facility])    r sided weakness, memory   Social History   Socioeconomic History  . Marital status: Single    Spouse name: Not on file  . Number of children: Not on file  . Years of education: Not on file  . Highest education level:  Not on file  Occupational History  . Not on file  Social Needs  . Financial resource strain: Not on file  . Food insecurity:    Worry: Not on file    Inability: Not on file  . Transportation needs:    Medical: Not on file    Non-medical: Not on file  Tobacco Use  . Smoking status: Former Smoker    Packs/day: 0.25    Years: 20.00    Pack years: 5.00    Types: Cigarettes  . Smokeless tobacco: Never Used  Substance and Sexual Activity  . Alcohol use: No  . 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 Sexual activity: Not on file  Lifestyle  . Physical activity:    Days per week: Not on file    Minutes per session: Not on file  . Stress: Not on file  Relationships  . Social connections:    Talks on phone: Not on file    Gets together: Not on file    Attends religious service: Not on file    Active member of club or organization: Not on file    Attends meetings of clubs or organizations: Not on file    Relationship status: Not on file  . Intimate partner violence:    Fear of current or ex partner: Not on file    Emotionally abused: Not on file    Physically abused: Not on file    Forced sexual activity: Not on file  Other Topics Concern  . Not on file  Social History Narrative  . Not on file   Immunization History  Administered Date(s) Administered  . Influenza Whole 06/01/2009, 05/07/2010  . Influenza,inj,Quad PF,6+ Mos 04/24/2015  . PPD Test 11/08/2016  . Pneumococcal Polysaccharide-23 08/14/2009, 04/24/2015  . Td 11/06/2008     Review of Systems  Constitutional: Positive for fatigue. Negative for fever and unexpected weight change.  HENT: Negative.   Eyes: Negative.   Respiratory: Negative for shortness of breath and wheezing.   Cardiovascular: Negative for chest pain, palpitations and leg swelling.  Endocrine: Negative for polydipsia, polyphagia and polyuria.  Genitourinary: Negative.   Musculoskeletal: Positive for arthralgias.  Skin:  Negative.   Neurological: Positive for speech difficulty (Memory deficits), weakness and numbness (neuropathy). Negative for dizziness, syncope, facial asymmetry and light-headedness.  Hematological: Negative.   Psychiatric/Behavioral: Positive for sleep disturbance.       Objective:   Physical Exam  Constitutional: He is oriented to person, place, and time. He appears well-developed and well-nourished.  HENT:  Head: Normocephalic and atraumatic.  Right Ear: External ear normal.  Left Ear: External ear normal.  Nose: Nose normal.  Mouth/Throat: Oropharynx is clear and moist.  Eyes: Pupils are equal, round, and reactive to light.  Neck: Normal range of motion.  Cardiovascular: Normal rate and regular rhythm.  Pulmonary/Chest: Effort normal and breath sounds normal.  Abdominal: Soft. Bowel sounds are normal.  Neurological: He is alert and oriented to person, place, and time. No cranial nerve deficit or sensory deficit. Coordination and gait abnormal.  Skin: Skin is warm and dry.  Psychiatric: His mood appears anxious. He is agitated. He expresses no homicidal and no suicidal ideation.  Patient appears nervous and anxious. He is having difficulty sleeping due to living conditions.       BP (!) 150/100 (BP Location: Left Arm, Patient Position: Sitting, Cuff Size: Large) Comment: manually  Pulse 67   Temp 97.9 F (36.6 C) (Oral)   Resp 14   Ht 6\' 9"  (2.057 m)   Wt 225 lb (102.1 kg)   SpO2 100%   BMI 24.11 kg/m  Assessment & Plan:  1. Controlled type 2 diabetes mellitus with complication, without long-term current use of insulin (HCC) Hemoglobin a1C is 6.4, which is at goal. Will restart Metformin 500 mg with breakfast.  - HgB A1c - POCT urinalysis dip (device)  2. Hyperlipidemia, unspecified hyperlipidemia type Recommend statin and ASA therapy due to history of CVA.  - aspirin EC 81 MG tablet; Take 1 tablet (81 mg total) by mouth daily.  Dispense: 90 tablet; Refill: 2 -  atorvastatin (LIPITOR) 40 MG tablet; Take 1 tablet (40 mg total) by mouth daily at 6 PM.  Dispense: 90 tablet; Refill: 2  3. Essential hypertension Uncontrolled hypertension, blood pressure is above goal. Will restart Amlodipine 10 mg daily.  - POCT urinalysis dip (device) - Comprehensive metabolic panel - EKG 02-IOXB - amLODipine (NORVASC) 10 MG tablet; Take 1 tablet (10 mg total) by mouth daily.  Dispense: 90 tablet; Refill: 1  4. Right sided weakness Right side weakness due to previous CVA, will restart statin and ASA therapy as stroke preventative.  5. Other fatigue - CBC  6. History of CVA with residual deficit - aspirin EC 81 MG tablet; Take 1 tablet (81 mg total) by mouth daily.  Dispense: 90 tablet; Refill: 2  7. Constipation, unspecified constipation type Increase water intake. Patient says that he typically does not drink much water. Recommend  that he chooses fruits and vegetable options.   8. Neuropathy - gabapentin (NEURONTIN) 300 MG capsule; Take 1 capsule (300 mg total) by mouth 3 (three) times daily.  Dispense: 90 capsule; Refill: 3  9. Urinary tract infection without hematuria, site unspecified Positive nitrites on UA. Will treat empirically for urinary tract infection. Will follow urine culture.  - Urine Culture - sulfamethoxazole-trimethoprim (BACTRIM DS,SEPTRA DS) 800-160 MG tablet; Take 1 tablet by mouth 2 (two) times daily.  Dispense: 20 tablet; Refill: 0   RTC: 1 month for chronic conditons  Donia Pounds  MSN, FNP-C [facility] [address] [place], [place] [address] [phone]   The patient was given clear instructions to go to ER or return to medical center if symptoms do not improve, worsen or new problems develop. The patient verbalized understanding.

## 2017-10-30 NOTE — Patient Instructions (Signed)
Thank you for establishing care today  You have a urinary tract infection, will start Bactrim 800-160 mg every 12 hours for 10 days.  Increase water intake to 3-4 bottles per day. Your hemoglobin A1c is 6.4, I will not add medications at this time.  Continue carbohydrate modify diet. Your blood pressure is above goal on current medication.  Will restart amlodipine 10 mg daily.  You will return to clinic for blood pressure check in 2 weeks.  We will also start daily aspirin 81 mg due to your history of stroke.  Will  restart atorvastatin for hyperlipidemia.   Your return in 1 month for chronic conditions.

## 2017-10-31 ENCOUNTER — Other Ambulatory Visit: Payer: Self-pay | Admitting: Family Medicine

## 2017-10-31 LAB — CBC
HEMOGLOBIN: 16.3 g/dL (ref 13.0–17.7)
Hematocrit: 51.6 % — ABNORMAL HIGH (ref 37.5–51.0)
MCH: 28.7 pg (ref 26.6–33.0)
MCHC: 31.6 g/dL (ref 31.5–35.7)
MCV: 91 fL (ref 79–97)
Platelets: 181 10*3/uL (ref 150–379)
RBC: 5.67 x10E6/uL (ref 4.14–5.80)
RDW: 14.1 % (ref 12.3–15.4)
WBC: 3.2 10*3/uL — ABNORMAL LOW (ref 3.4–10.8)

## 2017-10-31 LAB — COMPREHENSIVE METABOLIC PANEL
ALT: 16 IU/L (ref 0–44)
AST: 12 IU/L (ref 0–40)
Albumin/Globulin Ratio: 1.5 (ref 1.2–2.2)
Albumin: 4 g/dL (ref 3.5–5.5)
Alkaline Phosphatase: 79 IU/L (ref 39–117)
BUN/Creatinine Ratio: 14 (ref 9–20)
BUN: 17 mg/dL (ref 6–24)
Bilirubin Total: 0.4 mg/dL (ref 0.0–1.2)
CO2: 25 mmol/L (ref 20–29)
CREATININE: 1.18 mg/dL (ref 0.76–1.27)
Calcium: 8.8 mg/dL (ref 8.7–10.2)
Chloride: 104 mmol/L (ref 96–106)
GFR calc Af Amer: 79 mL/min/{1.73_m2} (ref >59)
GFR calc non Af Amer: 69 mL/min/{1.73_m2} (ref >59)
GLOBULIN, TOTAL: 2.7 g/dL (ref 1.5–4.5)
Glucose: 69 mg/dL (ref 65–99)
Potassium: 4 mmol/L (ref 3.5–5.2)
SODIUM: 142 mmol/L (ref 134–144)
Total Protein: 6.7 g/dL (ref 6.0–8.5)

## 2017-11-03 ENCOUNTER — Telehealth: Payer: Self-pay

## 2017-11-03 LAB — URINE CULTURE

## 2017-11-03 NOTE — Telephone Encounter (Signed)
Number in chart is incorrect number. Will mail letter. Thanks!

## 2017-11-03 NOTE — Telephone Encounter (Signed)
-----   Message from Dorena Dew, [place] sent at 11/03/2017 12:35 PM EDT ----- Regarding: lab results Please inform patient that urine culture yielded E. coli.  Please complete Bactrim 800-160 mg as prescribed.  We will follow-up in clinic as previously scheduled.   Donia Pounds  MSN, FNP-C [facility] [address] [place], [place] [address] [phone]

## 2017-11-13 ENCOUNTER — Ambulatory Visit: Payer: Self-pay

## 2017-11-13 VITALS — BP 136/92

## 2017-11-13 DIAGNOSIS — I10 Essential (primary) hypertension: Secondary | ICD-10-CM

## 2017-11-30 ENCOUNTER — Encounter: Payer: Self-pay | Admitting: Family Medicine

## 2017-11-30 ENCOUNTER — Ambulatory Visit (INDEPENDENT_AMBULATORY_CARE_PROVIDER_SITE_OTHER): Payer: Self-pay | Admitting: Family Medicine

## 2017-11-30 VITALS — BP 136/90 | HR 70 | Temp 98.0°F | Resp 14 | Ht >= 80 in | Wt 229.0 lb

## 2017-11-30 DIAGNOSIS — R350 Frequency of micturition: Secondary | ICD-10-CM

## 2017-11-30 DIAGNOSIS — N3000 Acute cystitis without hematuria: Secondary | ICD-10-CM

## 2017-11-30 DIAGNOSIS — I10 Essential (primary) hypertension: Secondary | ICD-10-CM

## 2017-11-30 DIAGNOSIS — Z1159 Encounter for screening for other viral diseases: Secondary | ICD-10-CM

## 2017-11-30 LAB — POCT URINALYSIS DIP (MANUAL ENTRY)
Bilirubin, UA: NEGATIVE
Glucose, UA: NEGATIVE mg/dL
Ketones, POC UA: NEGATIVE mg/dL
NITRITE UA: POSITIVE — AB
PH UA: 5.5 (ref 5.0–8.0)
RBC UA: NEGATIVE
Spec Grav, UA: >=1.03 — AB (ref 1.010–1.025)
UROBILINOGEN UA: 2 U/dL — AB

## 2017-11-30 MED ORDER — AMOXICILLIN-POT CLAVULANATE 875-125 MG PO TABS: 1.0000 | ORAL_TABLET | Freq: Two times a day (BID) | ORAL | 0 refills | Status: AC

## 2017-11-30 NOTE — Patient Instructions (Signed)
Augmentin 875-125 mg every 12 hours for 10 days for urinary tract infection. Increase daily water intake.   Your blood pressure is at goal on current medication regimen.   We have discussed target BP range and blood pressure goal. I have advised patient to check BP regularly and to call us back or report to clinic if the numbers are consistently higher than 140/90. We discussed the importance of compliance with medical therapy and DASH diet recommended, consequences of uncontrolled hypertension discussed.  - continue current BP medications We have discussed target BP range and blood pressure goal. I have advised patient to check BP regularly and to call us back or report to clinic if the numbers are consistently higher than 140/90. We discussed the importance of compliance with medical therapy and DASH diet recommended, consequences of uncontrolled hypertension discussed.  Urinary Tract Infection, Adult A urinary tract infection (UTI) is an infection of any part of the urinary tract. The urinary tract includes the:  Kidneys.  Ureters.  Bladder.  Urethra.  These organs make, store, and get rid of pee (urine) in the body. Follow these instructions at home:  Take over-the-counter and prescription medicines only as told by your doctor.  If you were prescribed an antibiotic medicine, take it as told by your doctor. Do not stop taking the antibiotic even if you start to feel better.  Avoid the following drinks: ? Alcohol. ? Caffeine. ? Tea. ? Carbonated drinks.  Drink enough fluid to keep your pee clear or pale yellow.  Keep all follow-up visits as told by your doctor. This is important.  Make sure to: ? Empty your bladder often and completely. Do not to hold pee for long periods of time. ? Empty your bladder before and after sex. ? Wipe from front to back after a bowel movement if you are male. Use each tissue one time when you wipe. Contact a doctor if:  You have back  pain.  You have a fever.  You feel sick to your stomach (nauseous).  You throw up (vomit).  Your symptoms do not get better after 3 days.  Your symptoms go away and then come back. Get help right away if:  You have very bad back pain.  You have very bad lower belly (abdominal) pain.  You are throwing up and cannot keep down any medicines or water. This information is not intended to replace advice given to you by your health care provider. Make sure you discuss any questions you have with your health care provider. Document Released: 01/11/2008 Document Revised: 12/31/2015 Document Reviewed: 06/15/2015 Elsevier Interactive Patient Education  [employer].

## 2017-11-30 NOTE — Progress Notes (Signed)
Subjective:    Javier Rivera ID: Javier Rivera, male    DOB: 1961/01/01, 57 y.o.   MRN: [id number]  HPI Javier Rivera, a 57 year old male presents accompanor a follow up of chronic conditions. Javier Rivera says that he has been taking medications consistently since previous appointment. He says that he is unable to drink water or follow a balanced diet because he primarily eats at homeless shelter.  Javier Rivera was recently treated for a urinary tract infection. He says that he did not complete antibiotic. He denies low back pain, hematuria,  or dysuria. He endorses urinary frequency.  Javier Rivera has a history of type 2 diabetes mellitus, CVA with right side weakness, hypertension, and chronic kidney disease. Also, Javier Rivera is a former smoker.Javier Rivera denies increase appetite, nausea, polydipsia, polyuria, visual disturbances, vomitting and weight loss. He endorses neuropathy. Javier Rivera does not check blood sugars. He does not have a glucometer.  Javier Rivera is here for evaluation of hypertension. Javier Rivera reports that previous CVA was due to uncontrolled hypertension. Javier Rivera denies chest pain, dyspnea, lower extremity edema and near-syncope.  Cardiovascular risk factors: diabetes mellitus and dyslipidemia.  Javier Rivera reports difficulty with memory due to history of CVA. He also endorses right side weakness. He says that he was followed by neurology years ago, but does not recall details. Past Medical History:  Diagnosis Date  . Diabetes mellitus without complication ([place])   . Hypertension   . Renal disorder    pt stated that masses were found on his kidneys  . Renal insufficiency   . Stroke [facility])    r sided weakness, memory   Social History   Socioeconomic History  . Marital status: Single    Spouse name: Not on file  . Number of children: Not on file  . Years of education: Not on file  . Highest education level: Not on file  Occupational History  . Not on file  Social Needs  . Financial resource  strain: Not on file  . Food insecurity:    Worry: Not on file    Inability: Not on file  . Transportation needs:    Medical: Not on file    Non-medical: Not on file  Tobacco Use  . Smoking status: Former Smoker    Packs/day: 0.25    Years: 20.00    Pack years: 5.00    Types: Cigarettes  . Smokeless tobacco: Never Used  Substance and Sexual Activity  . Alcohol use: No  . 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 Sexual activity: Not on file  Lifestyle  . Physical activity:    Days per week: Not on file    Minutes per session: Not on file  . Stress: Not on file  Relationships  . Social connections:    Talks on phone: Not on file    Gets together: Not on file    Attends religious service: Not on file    Active member of club or organization: Not on file    Attends meetings of clubs or organizations: Not on file    Relationship status: Not on file  . Intimate partner violence:    Fear of current or ex partner: Not on file    Emotionally abused: Not on file    Physically abused: Not on file    Forced sexual activity: Not on file  Other Topics Concern  . Not on file  Social History Narrative  . Not on file  Immunization History  Administered Date(s) Administered  . Influenza Whole 06/01/2009, 05/07/2010  . Influenza,inj,Quad PF,6+ Mos 04/24/2015  . PPD Test 11/08/2016  . Pneumococcal Polysaccharide-23 08/14/2009, 04/24/2015  . Td 11/06/2008     Review of Systems  Constitutional: Positive for fatigue. Negative for fever and unexpected weight change.  HENT: Negative.   Eyes: Negative.   Respiratory: Negative for shortness of breath and wheezing.   Cardiovascular: Negative for chest pain, palpitations and leg swelling.  Endocrine: Negative for polydipsia, polyphagia and polyuria.  Genitourinary: Negative.   Musculoskeletal: Positive for arthralgias.  Skin: Negative.   Neurological: Positive for speech difficulty (Memory deficits), weakness and  numbness (neuropathy). Negative for dizziness, syncope, facial asymmetry and light-headedness.  Hematological: Negative.   Psychiatric/Behavioral: Positive for sleep disturbance.       Objective:   Physical Exam  Constitutional: He is oriented to person, place, and time. He appears well-developed and well-nourished.  HENT:  Head: Normocephalic and atraumatic.  Right Ear: External ear normal.  Left Ear: External ear normal.  Nose: Nose normal.  Mouth/Throat: Oropharynx is clear and moist.  Eyes: Pupils are equal, round, and reactive to light.  Neck: Normal range of motion.  Cardiovascular: Normal rate and regular rhythm.  Pulmonary/Chest: Effort normal and breath sounds normal.  Abdominal: Soft. Bowel sounds are normal.  Neurological: He is alert and oriented to person, place, and time. No cranial nerve deficit or sensory deficit. Coordination and gait abnormal.  Skin: Skin is warm and dry.  Psychiatric: His mood appears anxious. He is agitated. He expresses no homicidal and no suicidal ideation.  Javier Rivera appears nervous and anxious. He is having difficulty sleeping due to living conditions.       BP 136/90   Pulse 70   Temp 98 F (36.7 C) (Oral)   Resp 14   Ht 6\' 9"  (2.057 m)   Wt 229 lb (103.9 kg)   SpO2 99%   BMI 24.54 kg/m  Assessment & Plan:  1. Essential hypertension Blood pressure is at goal on current medication regimen. No medication changes warranted on today. Javier Rivera is unable to check blood pressure or return routinely for blood pressure checks. Advised to follow a low sodium diet as much as he can.  - POCT urinalysis dipstick - Basic Metabolic Panel - Microalbumin/Creatinine Ratio, Urine  2. Acute cystitis without hematuria Javier Rivera given 12 eight ounce bottles of water.  - Urine Culture - amoxicillin-clavulanate (AUGMENTIN) 875-125 MG tablet; Take 1 tablet by mouth 2 (two) times daily for 10 days.  Dispense: 20 tablet; Refill: 0  3. Urinary frequency -  Urine Culture  4. Need for hepatitis C screening test - Hepatitis C Antibody  RTC: Javier Rivera will follow up in 3 months to repeat hemoglobin a1C and for f/u of hypertension   The Javier Rivera was given clear instructions to go to ER or return to medical center if symptoms do not improve, worsen or new problems develop. The Javier Rivera verbalized understanding.     Donia Pounds  MSN, FNP-C [facility] [address] [place], [place] [address] [phone]

## 2017-12-01 LAB — BASIC METABOLIC PANEL
BUN/Creatinine Ratio: 13 (ref 9–20)
BUN: 14 mg/dL (ref 6–24)
CHLORIDE: 101 mmol/L (ref 96–106)
CO2: 25 mmol/L (ref 20–29)
Calcium: 9 mg/dL (ref 8.7–10.2)
Creatinine, Ser: 1.11 mg/dL (ref 0.76–1.27)
GFR calc Af Amer: 85 mL/min/{1.73_m2} (ref >59)
GFR calc non Af Amer: 74 mL/min/{1.73_m2} (ref >59)
Glucose: 90 mg/dL (ref 65–99)
POTASSIUM: 4 mmol/L (ref 3.5–5.2)
Sodium: 141 mmol/L (ref 134–144)

## 2017-12-01 LAB — MICROALBUMIN / CREATININE URINE RATIO
Creatinine, Urine: 161.1 mg/dL
MICROALB/CREAT RATIO: 98.1 mg/g{creat} — AB (ref 0.0–30.0)
MICROALBUM., U, RANDOM: 158 ug/mL

## 2017-12-01 LAB — HEPATITIS C ANTIBODY: Hep C Virus Ab: <0.1 {s_co_ratio} (ref 0.0–0.9)

## 2017-12-05 LAB — URINE CULTURE

## 2017-12-07 IMAGING — CT CT RENAL STONE PROTOCOL
2 of 3 series · 16 of 46 positions shown, 18 images · non-contrast
Comparison: 10/02/2015

CLINICAL DATA: Hematuria, dysuria, urinary frequency, and low back
pain for 1 day. History of renal disorder and monitoring masses on
the kidneys.

EXAM:
CT ABDOMEN AND PELVIS WITHOUT CONTRAST
TECHNIQUE: Multidetector CT imaging of the abdomen and pelvis was performed
following the standard protocol without IV contrast.

[Series 4: lung · axial · 0.74mm/px · z∈[-191,-61]mm · 13 of 75 slices shown, 15 images]
[im 5/75  soft-tissue]
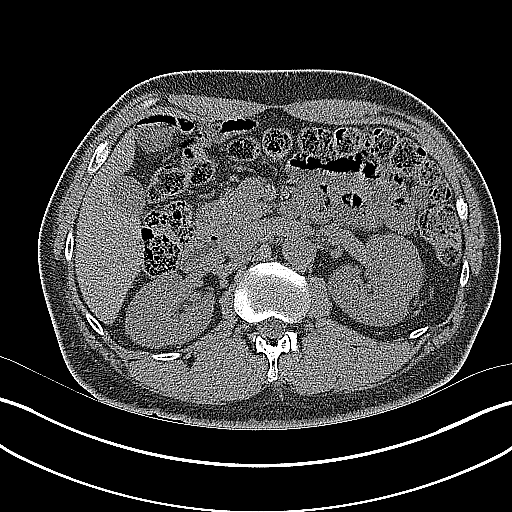
[im 5/75  bone]
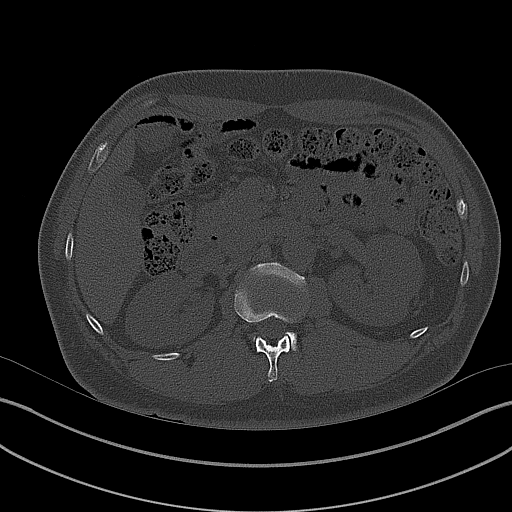
[im 10/75  soft-tissue]
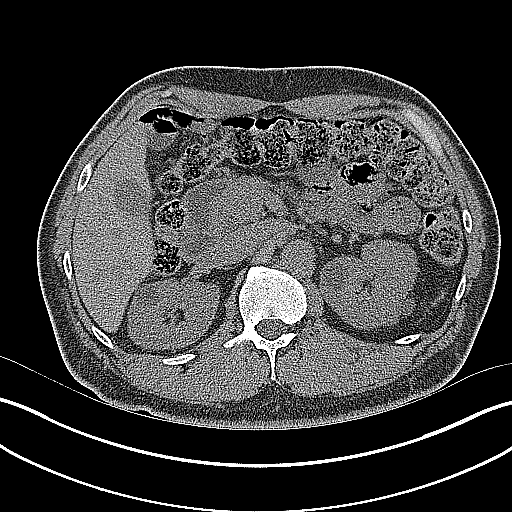
[im 15/75  soft-tissue]
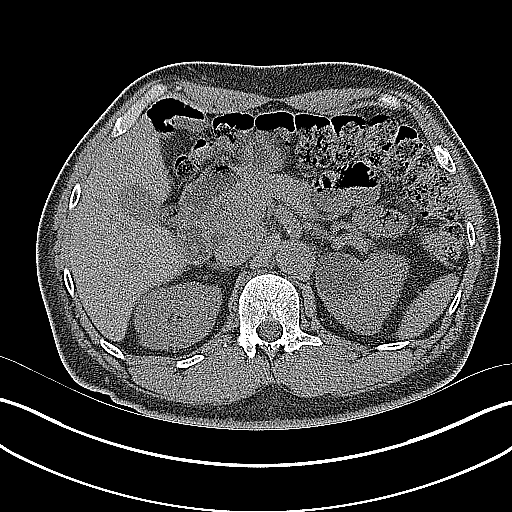
[im 22/75  soft-tissue]
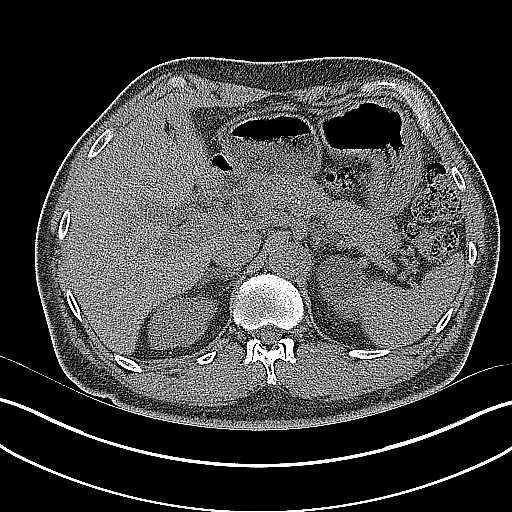
[im 27/75  soft-tissue]
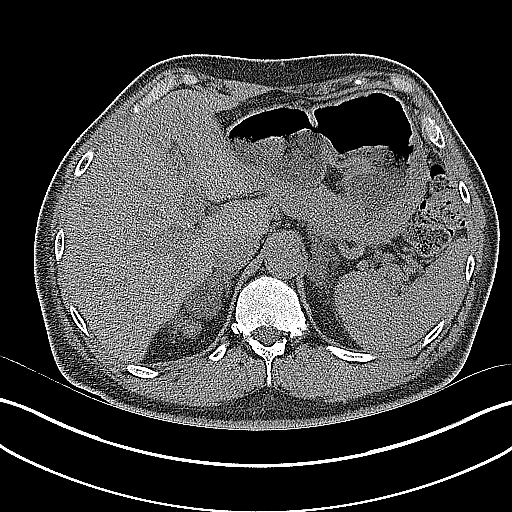
[im 32/75  soft-tissue]
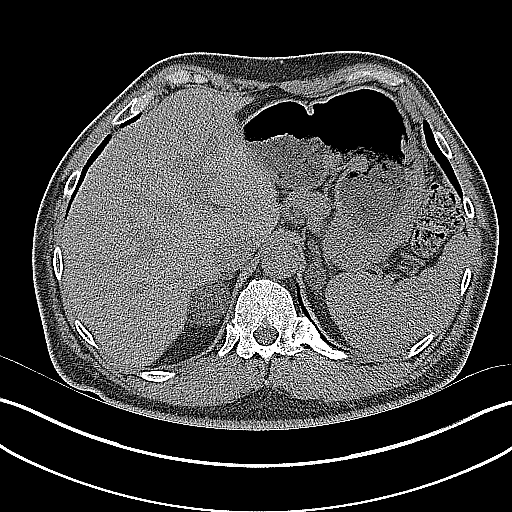
[im 39/75  soft-tissue]
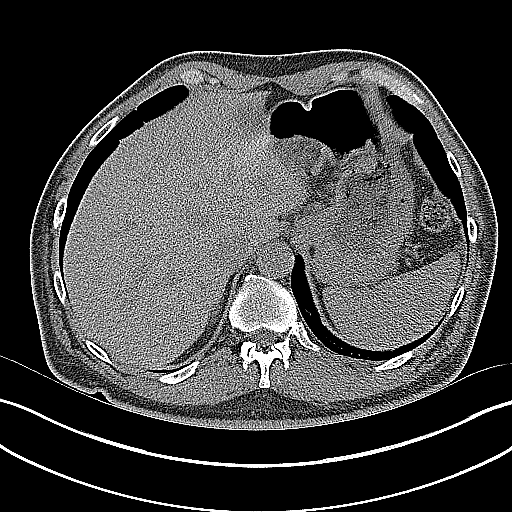
[im 43/75  soft-tissue]
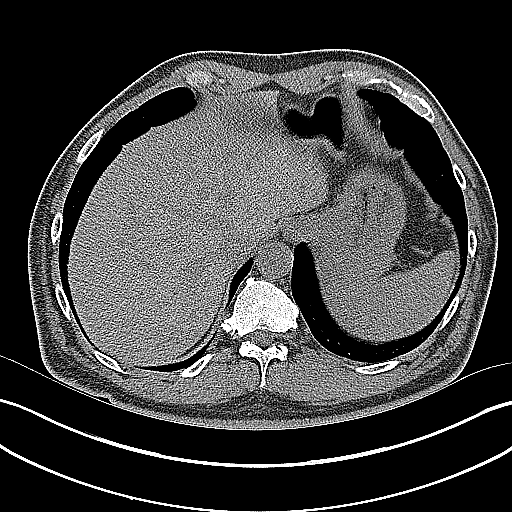
[im 48/75  soft-tissue]
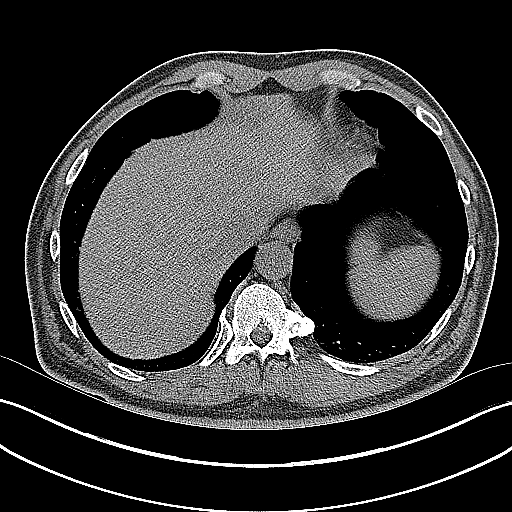
[im 48/75  bone]
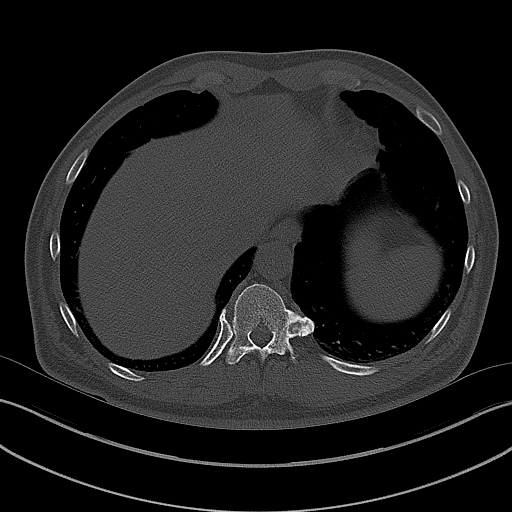
[im 53/75  soft-tissue]
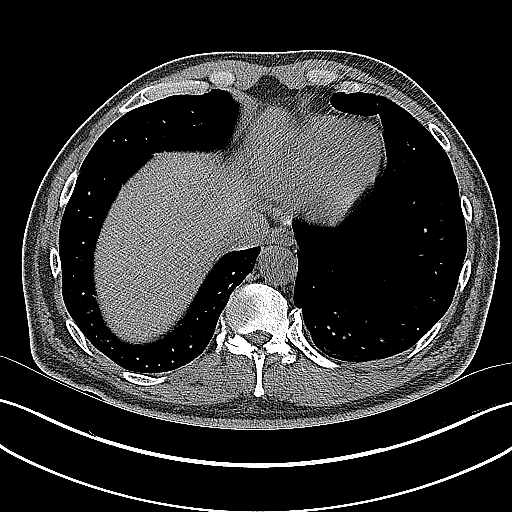
[im 60/75  soft-tissue]
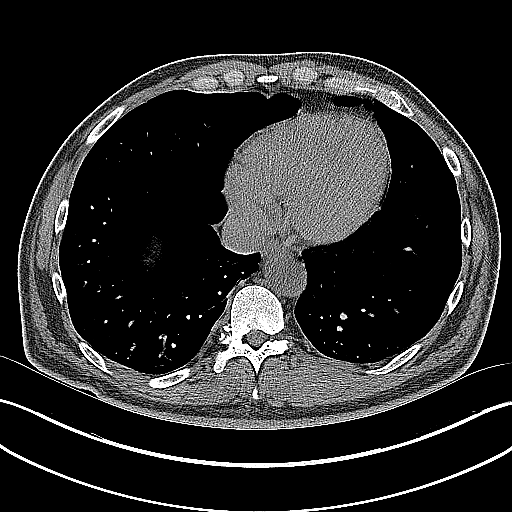
[im 65/75  soft-tissue]
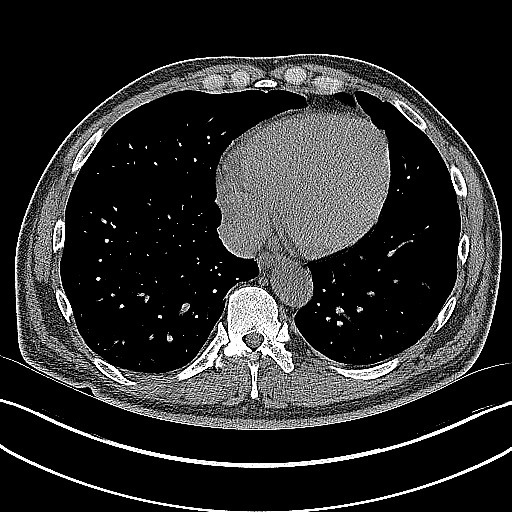
[im 70/75  soft-tissue]
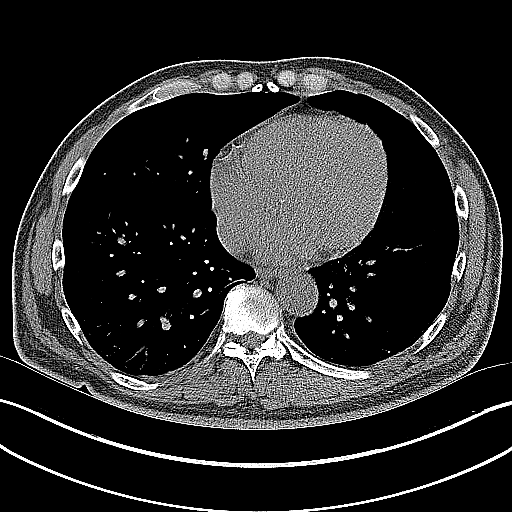

[Series 5: coronal · coronal · 0.78mm/px · 3 of 129 slices shown]
[im 43/129  soft-tissue]
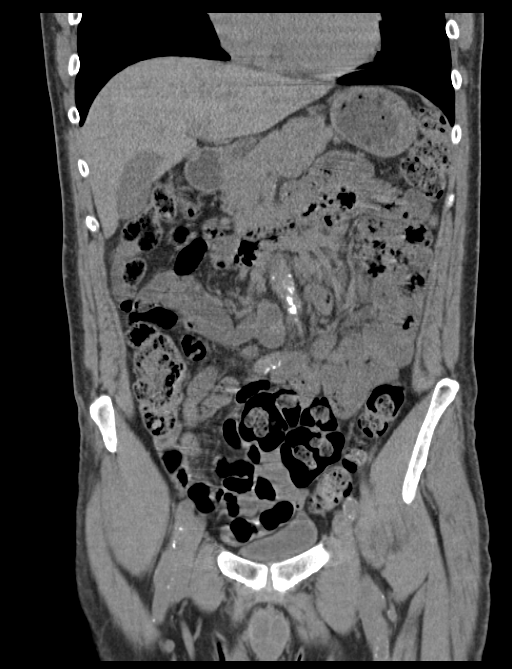
[im 57/129  soft-tissue]
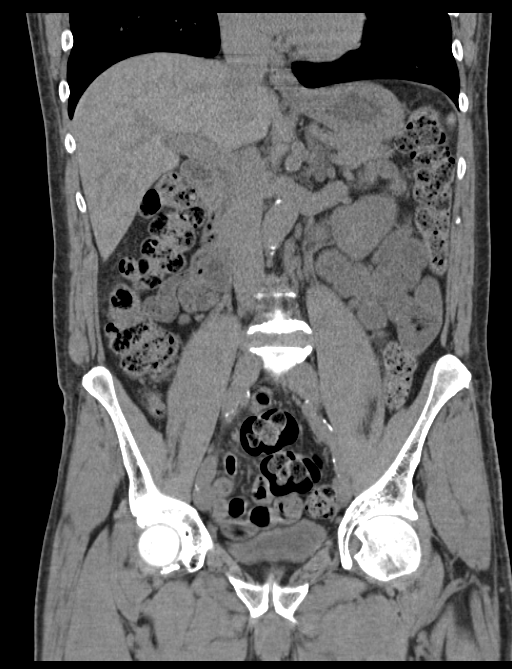
[im 72/129  soft-tissue]
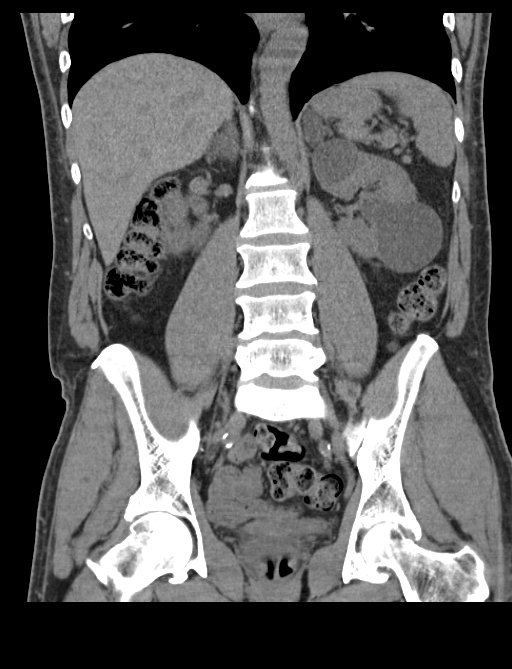

[16 of 46 positions shown; findings below may reference images not displayed]

FINDINGS: Dependent changes in the lung bases.

Multiple lesions demonstrated in both kidneys likely representing
cysts. No significant change since prior study. No hydronephrosis or
hydroureter. No renal, ureteral, or bladder stones identified.
Bladder wall is not thickened.

Low-attenuation lesion in the left lobe of the liver corresponding
to hemangioma noted on prior contrast-enhanced study. Unenhanced
appearance of the gallbladder, pancreas, spleen, inferior vena cava,
and retroperitoneal lymph nodes is unremarkable. Bilateral adrenal
gland nodules without change in size since prior study. Hounsfield
unit measurements are consistent with benign adenomas. Calcification
and ectasia of the abdominal aorta with maximal diameter of about 3
cm. No change since prior study.

Stomach, small bowel, and colon are not abnormally distended. Can't
exclude wall thickening in the upper stomach but this may just be
due to under distention of that segment. Consider gastritis.
Stool-filled colon. No free air or free fluid in the abdomen.

Pelvis: Prostate gland is not enlarged. Bladder wall is not
thickened. No free or loculated pelvic fluid collections. No pelvic
mass or lymphadenopathy. Appendix is normal. No destructive bone
lesions.
IMPRESSION: No renal or ureteral stone or obstruction. No acute process
demonstrated in the abdomen or pelvis. Again demonstrated are
benign-appearing intrarenal and renal lesions, hepatic hemangioma,
and 3 cm diameter abdominal aortic aneurysm. No change since prior
study.

## 2017-12-11 MED FILL — AMOX-CLAV 875-125 MG TABLET: 875-125 | 10 days supply | Qty: 20 | Fill #0

## 2017-12-14 ENCOUNTER — Telehealth: Payer: Self-pay

## 2017-12-14 DIAGNOSIS — I6930 Unspecified sequelae of cerebral infarction: Secondary | ICD-10-CM

## 2017-12-14 DIAGNOSIS — I10 Essential (primary) hypertension: Secondary | ICD-10-CM

## 2017-12-14 DIAGNOSIS — E785 Hyperlipidemia, unspecified: Secondary | ICD-10-CM

## 2017-12-14 DIAGNOSIS — G629 Polyneuropathy, unspecified: Secondary | ICD-10-CM

## 2017-12-14 MED ORDER — ASPIRIN EC 81 MG PO TBEC: 81.0000 mg | DELAYED_RELEASE_TABLET | Freq: Every day | ORAL | 2 refills | Status: AC

## 2017-12-14 MED ORDER — GABAPENTIN 300 MG PO CAPS: 300.0000 mg | ORAL_CAPSULE | Freq: Three times a day (TID) | ORAL | 3 refills | Status: DC

## 2017-12-14 MED ORDER — AMLODIPINE BESYLATE 10 MG PO TABS: 10.0000 mg | ORAL_TABLET | Freq: Every day | ORAL | 1 refills | Status: DC

## 2017-12-14 MED ORDER — GLIPIZIDE 10 MG PO TABS: 10.0000 mg | ORAL_TABLET | Freq: Every day | ORAL | 2 refills | Status: DC

## 2017-12-14 MED ORDER — ATORVASTATIN CALCIUM 40 MG PO TABS: 40.0000 mg | ORAL_TABLET | Freq: Every day | ORAL | 2 refills | Status: DC

## 2017-12-14 MED FILL — glipiZIDE 10 MG TABS: 10 | 30 days supply | Qty: 30 | Fill #0

## 2017-12-14 MED FILL — GABAPENTIN 300 MG CAPSULE: 300 | 30 days supply | Qty: 90 | Fill #0

## 2017-12-14 MED FILL — ATORVASTATIN CALCIUM 40 MG: 40 | 30 days supply | Qty: 30 | Fill #0

## 2017-12-14 MED FILL — AMLODIPINE BESYLATE 10 MG T: 10 | 30 days supply | Qty: 30 | Fill #0

## 2017-12-14 NOTE — Telephone Encounter (Signed)
Refills sent into pharmacy. Thanks!  

## 2017-12-23 IMAGING — CR DG CHEST 2V
2 series · 2 of 2 positions shown · non-contrast
Comparison: 09/16/2015, 04/23/2015, 01/01/2015, 09/05/2014,
05/31/2009. CT chest 04/24/2015.

CLINICAL DATA: Shortness of breath .

EXAM:
CHEST  2 VIEW

[chest pa]
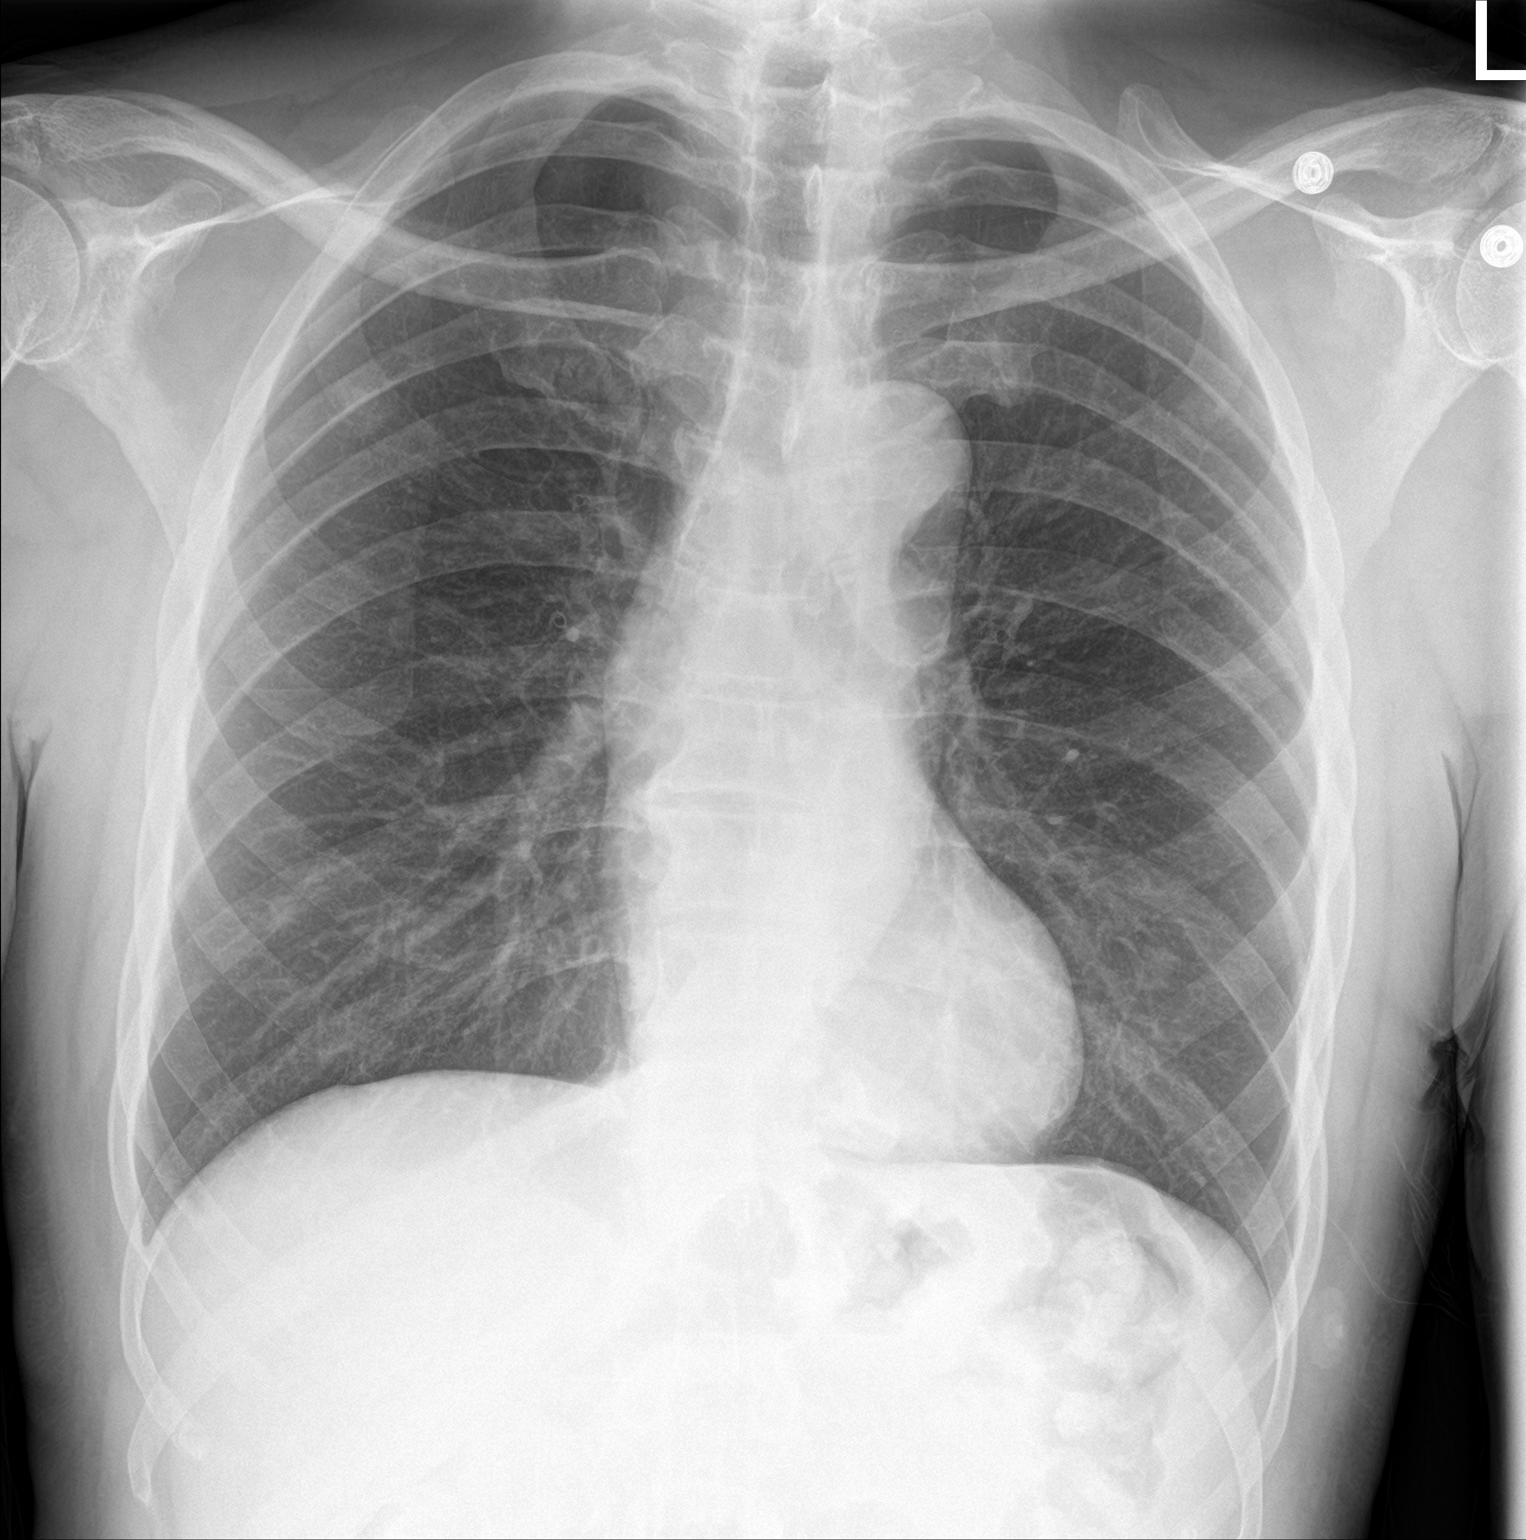

[chest lat]
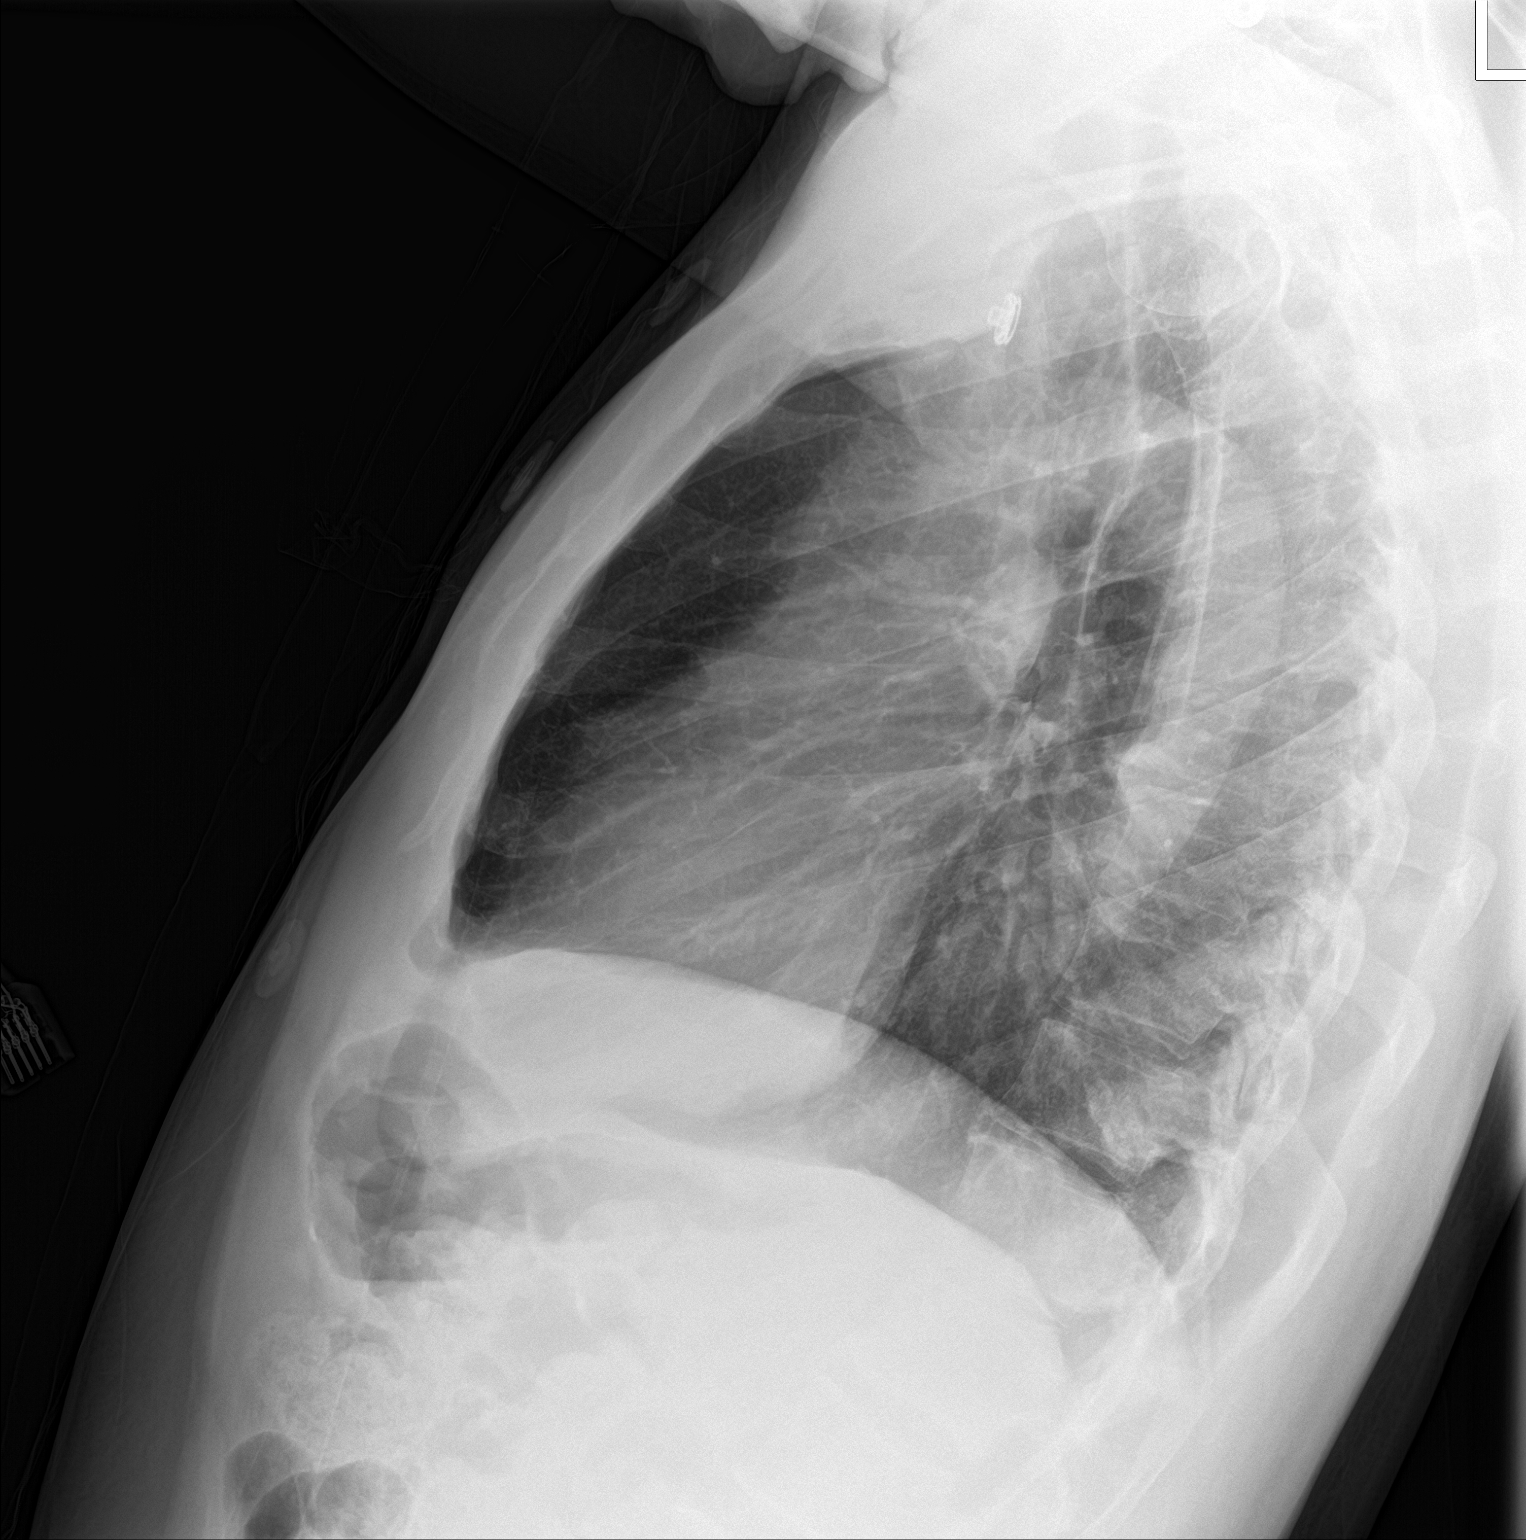

[2 of 2 positions shown; findings below may reference images not displayed]

FINDINGS: Mediastinum and hilar structures normal. Questionable density noted
in the lung bases, most likely left lower lobe. Best seen on lateral
view. Questionable nodular opacity right upper lobe. Follow-up PA
lateral chest x-ray suggested. If these densities persists
nonenhanced chest CT suggest for further evaluation . No pleural
effusion or pneumothorax. Heart size normal.
IMPRESSION: 1. Questionable density, most likely left lower lobe, best seen on
lateral view. Questionable nodule right upper lobe. A repeat PA and
lateral chest x-ray is suggested, if these densities persist
nonenhanced chest CT is suggested.

2.  Bilateral nipple shadows again noted.

## 2018-01-17 MED FILL — glipiZIDE 10 MG TABS: 10 | 30 days supply | Qty: 30 | Fill #1

## 2018-01-17 MED FILL — ATORVASTATIN CALCIUM 40 MG: 40 | 30 days supply | Qty: 30 | Fill #1

## 2018-01-17 MED FILL — AMLODIPINE BESYLATE 10 MG T: 10 | 30 days supply | Qty: 30 | Fill #1

## 2018-02-16 MED FILL — AMLODIPINE BESYLATE 10 MG T: 10 | 30 days supply | Qty: 30 | Fill #2

## 2018-02-16 MED FILL — glipiZIDE 10 MG TABS: 10 | 30 days supply | Qty: 30 | Fill #2

## 2018-03-01 ENCOUNTER — Ambulatory Visit: Payer: Self-pay | Admitting: Family Medicine

## 2018-03-19 MED FILL — AMLODIPINE BESYLATE 10 MG T: 10 | 30 days supply | Qty: 30 | Fill #3

## 2018-03-20 ENCOUNTER — Other Ambulatory Visit: Payer: Self-pay

## 2019-09-23 ENCOUNTER — Inpatient Hospital Stay (HOSPITAL_COMMUNITY)
Admission: EM | Admit: 2019-09-23 | Discharge: 2019-09-25 | DRG: 305 | Disposition: A | Discharge status: Home / Self Care | Payer: Medicaid Other | Attending: Internal Medicine | Admitting: Internal Medicine

## 2019-09-23 ENCOUNTER — Emergency Department (HOSPITAL_COMMUNITY): Payer: Medicaid Other

## 2019-09-23 ENCOUNTER — Other Ambulatory Visit: Payer: Self-pay

## 2019-09-23 ENCOUNTER — Observation Stay (HOSPITAL_BASED_OUTPATIENT_CLINIC_OR_DEPARTMENT_OTHER): Payer: Medicaid Other

## 2019-09-23 ENCOUNTER — Encounter (HOSPITAL_COMMUNITY): Payer: Self-pay | Admitting: Emergency Medicine

## 2019-09-23 DIAGNOSIS — R079 Chest pain, unspecified: Secondary | ICD-10-CM | POA: Diagnosis present

## 2019-09-23 DIAGNOSIS — D72819 Decreased white blood cell count, unspecified: Secondary | ICD-10-CM | POA: Diagnosis present

## 2019-09-23 DIAGNOSIS — Z20822 Contact with and (suspected) exposure to covid-19: Secondary | ICD-10-CM | POA: Diagnosis present

## 2019-09-23 DIAGNOSIS — E1140 Type 2 diabetes mellitus with diabetic neuropathy, unspecified: Secondary | ICD-10-CM | POA: Diagnosis present

## 2019-09-23 DIAGNOSIS — Z87891 Personal history of nicotine dependence: Secondary | ICD-10-CM

## 2019-09-23 DIAGNOSIS — E1159 Type 2 diabetes mellitus with other circulatory complications: Secondary | ICD-10-CM | POA: Diagnosis not present

## 2019-09-23 DIAGNOSIS — I160 Hypertensive urgency: Principal | ICD-10-CM | POA: Diagnosis present

## 2019-09-23 DIAGNOSIS — N259 Disorder resulting from impaired renal tubular function, unspecified: Secondary | ICD-10-CM | POA: Diagnosis present

## 2019-09-23 DIAGNOSIS — I77810 Thoracic aortic ectasia: Secondary | ICD-10-CM

## 2019-09-23 DIAGNOSIS — I714 Abdominal aortic aneurysm, without rupture, unspecified: Secondary | ICD-10-CM

## 2019-09-23 DIAGNOSIS — E118 Type 2 diabetes mellitus with unspecified complications: Secondary | ICD-10-CM

## 2019-09-23 DIAGNOSIS — I712 Thoracic aortic aneurysm, without rupture, unspecified: Secondary | ICD-10-CM

## 2019-09-23 DIAGNOSIS — I129 Hypertensive chronic kidney disease with stage 1 through stage 4 chronic kidney disease, or unspecified chronic kidney disease: Secondary | ICD-10-CM | POA: Diagnosis present

## 2019-09-23 DIAGNOSIS — D751 Secondary polycythemia: Secondary | ICD-10-CM | POA: Diagnosis present

## 2019-09-23 DIAGNOSIS — I351 Nonrheumatic aortic (valve) insufficiency: Secondary | ICD-10-CM | POA: Diagnosis not present

## 2019-09-23 DIAGNOSIS — Z833 Family history of diabetes mellitus: Secondary | ICD-10-CM

## 2019-09-23 DIAGNOSIS — E785 Hyperlipidemia, unspecified: Secondary | ICD-10-CM | POA: Diagnosis present

## 2019-09-23 DIAGNOSIS — E119 Type 2 diabetes mellitus without complications: Secondary | ICD-10-CM

## 2019-09-23 DIAGNOSIS — R519 Headache, unspecified: Secondary | ICD-10-CM | POA: Diagnosis present

## 2019-09-23 DIAGNOSIS — I10 Essential (primary) hypertension: Secondary | ICD-10-CM

## 2019-09-23 DIAGNOSIS — N390 Urinary tract infection, site not specified: Secondary | ICD-10-CM | POA: Diagnosis present

## 2019-09-23 DIAGNOSIS — R209 Unspecified disturbances of skin sensation: Secondary | ICD-10-CM | POA: Diagnosis present

## 2019-09-23 DIAGNOSIS — N182 Chronic kidney disease, stage 2 (mild): Secondary | ICD-10-CM | POA: Diagnosis present

## 2019-09-23 DIAGNOSIS — I69351 Hemiplegia and hemiparesis following cerebral infarction affecting right dominant side: Secondary | ICD-10-CM

## 2019-09-23 DIAGNOSIS — N179 Acute kidney failure, unspecified: Secondary | ICD-10-CM | POA: Diagnosis present

## 2019-09-23 DIAGNOSIS — Z8249 Family history of ischemic heart disease and other diseases of the circulatory system: Secondary | ICD-10-CM

## 2019-09-23 DIAGNOSIS — K5901 Slow transit constipation: Secondary | ICD-10-CM | POA: Diagnosis present

## 2019-09-23 DIAGNOSIS — I2510 Atherosclerotic heart disease of native coronary artery without angina pectoris: Secondary | ICD-10-CM | POA: Diagnosis present

## 2019-09-23 DIAGNOSIS — Z9114 Patient's other noncompliance with medication regimen: Secondary | ICD-10-CM

## 2019-09-23 DIAGNOSIS — Z888 Allergy status to other drugs, medicaments and biological substances status: Secondary | ICD-10-CM

## 2019-09-23 DIAGNOSIS — E1122 Type 2 diabetes mellitus with diabetic chronic kidney disease: Secondary | ICD-10-CM | POA: Diagnosis present

## 2019-09-23 LAB — GLUCOSE, CAPILLARY
Glucose-Capillary: 145 mg/dL — ABNORMAL HIGH (ref 70–99)
Glucose-Capillary: 111 mg/dL — ABNORMAL HIGH (ref 70–99)

## 2019-09-23 LAB — CBC WITH DIFFERENTIAL/PLATELET
Abs Immature Granulocytes: 0.01 10*3/uL (ref 0.00–0.07)
Basophils Absolute: 0 10*3/uL (ref 0.0–0.1)
Basophils Relative: 0 %
Eosinophils Absolute: 0 10*3/uL (ref 0.0–0.5)
Eosinophils Relative: 1 %
HCT: 53.6 % — ABNORMAL HIGH (ref 39.0–52.0)
Hemoglobin: 17.4 g/dL — ABNORMAL HIGH (ref 13.0–17.0)
Immature Granulocytes: 0 %
Lymphocytes Relative: 47 %
Lymphs Abs: 1.7 10*3/uL (ref 0.7–4.0)
MCH: 28.7 pg (ref 26.0–34.0)
MCHC: 32.5 g/dL (ref 30.0–36.0)
MCV: 88.4 fL (ref 80.0–100.0)
Monocytes Absolute: 0.3 10*3/uL (ref 0.1–1.0)
Monocytes Relative: 9 %
Neutro Abs: 1.5 10*3/uL — ABNORMAL LOW (ref 1.7–7.7)
Neutrophils Relative %: 43 %
Platelets: 173 10*3/uL (ref 150–400)
RBC: 6.06 MIL/uL — ABNORMAL HIGH (ref 4.22–5.81)
RDW: 13.2 % (ref 11.5–15.5)
WBC: 3.6 10*3/uL — ABNORMAL LOW (ref 4.0–10.5)
nRBC: 0 % (ref 0.0–0.2)

## 2019-09-23 LAB — URINALYSIS, ROUTINE W REFLEX MICROSCOPIC
Bilirubin Urine: NEGATIVE
Glucose, UA: NEGATIVE mg/dL
Hgb urine dipstick: NEGATIVE
Ketones, ur: NEGATIVE mg/dL
Leukocytes,Ua: NEGATIVE
Nitrite: POSITIVE — AB
Protein, ur: 30 mg/dL — AB
Specific Gravity, Urine: 1.045 — ABNORMAL HIGH (ref 1.005–1.030)
pH: 6 (ref 5.0–8.0)

## 2019-09-23 LAB — RESPIRATORY PANEL BY RT PCR (FLU A&B, COVID)
Influenza A by PCR: NEGATIVE
Influenza B by PCR: NEGATIVE
SARS Coronavirus 2 by RT PCR: NEGATIVE

## 2019-09-23 LAB — RAPID URINE DRUG SCREEN, HOSP PERFORMED
Amphetamines: NOT DETECTED
Barbiturates: NOT DETECTED
Benzodiazepines: NOT DETECTED
Cocaine: NOT DETECTED
Opiates: NOT DETECTED
Tetrahydrocannabinol: NOT DETECTED

## 2019-09-23 LAB — TROPONIN I (HIGH SENSITIVITY)
Troponin I (High Sensitivity): 6 ng/L (ref <18)
Troponin I (High Sensitivity): 5 ng/L (ref <18)

## 2019-09-23 LAB — LIPID PANEL
Cholesterol: 213 mg/dL — ABNORMAL HIGH (ref 0–200)
HDL: 36 mg/dL — ABNORMAL LOW (ref >40)
LDL Cholesterol: 162 mg/dL — ABNORMAL HIGH (ref 0–99)
Total CHOL/HDL Ratio: 5.9 RATIO
Triglycerides: 76 mg/dL (ref <150)
VLDL: 15 mg/dL (ref 0–40)

## 2019-09-23 LAB — ECHOCARDIOGRAM COMPLETE
Height: 81 in
Weight: 3664.93 oz

## 2019-09-23 LAB — COMPREHENSIVE METABOLIC PANEL
ALT: 23 U/L (ref 0–44)
AST: 24 U/L (ref 15–41)
Albumin: 3.7 g/dL (ref 3.5–5.0)
Alkaline Phosphatase: 52 U/L (ref 38–126)
Anion gap: 11 (ref 5–15)
BUN: 12 mg/dL (ref 6–20)
CO2: 22 mmol/L (ref 22–32)
Calcium: 8.5 mg/dL — ABNORMAL LOW (ref 8.9–10.3)
Chloride: 104 mmol/L (ref 98–111)
Creatinine, Ser: 1.27 mg/dL — ABNORMAL HIGH (ref 0.61–1.24)
GFR calc Af Amer: >60 mL/min (ref >60)
GFR calc non Af Amer: >60 mL/min (ref >60)
Glucose, Bld: 150 mg/dL — ABNORMAL HIGH (ref 70–99)
Potassium: 3.9 mmol/L (ref 3.5–5.1)
Sodium: 137 mmol/L (ref 135–145)
Total Bilirubin: 0.8 mg/dL (ref 0.3–1.2)
Total Protein: 7.1 g/dL (ref 6.5–8.1)

## 2019-09-23 LAB — I-STAT CHEM 8, ED
BUN: 15 mg/dL (ref 6–20)
Calcium, Ion: 0.93 mmol/L — ABNORMAL LOW (ref 1.15–1.40)
Chloride: 104 mmol/L (ref 98–111)
Creatinine, Ser: 1.3 mg/dL — ABNORMAL HIGH (ref 0.61–1.24)
Glucose, Bld: 143 mg/dL — ABNORMAL HIGH (ref 70–99)
HCT: 53 % — ABNORMAL HIGH (ref 39.0–52.0)
Hemoglobin: 18 g/dL — ABNORMAL HIGH (ref 13.0–17.0)
Potassium: 4 mmol/L (ref 3.5–5.1)
Sodium: 138 mmol/L (ref 135–145)
TCO2: 27 mmol/L (ref 22–32)

## 2019-09-23 LAB — HEMOGLOBIN A1C
Hgb A1c MFr Bld: 6.9 % — ABNORMAL HIGH (ref 4.8–5.6)
Mean Plasma Glucose: 151.33 mg/dL

## 2019-09-23 LAB — ETHANOL: Alcohol, Ethyl (B): <10 mg/dL (ref <10)

## 2019-09-23 LAB — HIV ANTIBODY (ROUTINE TESTING W REFLEX): HIV Screen 4th Generation wRfx: NONREACTIVE

## 2019-09-23 LAB — CBG MONITORING, ED
Glucose-Capillary: 145 mg/dL — ABNORMAL HIGH (ref 70–99)
Glucose-Capillary: 110 mg/dL — ABNORMAL HIGH (ref 70–99)

## 2019-09-23 LAB — TSH: TSH: 2.287 u[IU]/mL (ref 0.350–4.500)

## 2019-09-23 MED ORDER — AMLODIPINE BESYLATE 5 MG PO TABS
10.0000 mg | ORAL_TABLET | Freq: Once | ORAL | Status: AC
  Administered 2019-09-23: 04:00:00 10 mg via ORAL
  Filled 2019-09-23: qty 2

## 2019-09-23 MED ORDER — HYDRALAZINE HCL 20 MG/ML IJ SOLN
10.0000 mg | Freq: Once | INTRAMUSCULAR | Status: AC
  Administered 2019-09-23: 05:00:00 10 mg via INTRAVENOUS
  Filled 2019-09-23: qty 1

## 2019-09-23 MED ORDER — LISINOPRIL 10 MG PO TABS
10.0000 mg | ORAL_TABLET | Freq: Every day | ORAL | Status: DC
  Filled 2019-09-23: qty 1

## 2019-09-23 MED ORDER — METOPROLOL TARTRATE 25 MG PO TABS
50.0000 mg | ORAL_TABLET | Freq: Once | ORAL | Status: AC
  Administered 2019-09-23: 50 mg via ORAL
  Filled 2019-09-23: qty 2

## 2019-09-23 MED ORDER — NITROGLYCERIN 0.4 MG SL SUBL: 0.4000 mg | SUBLINGUAL_TABLET | SUBLINGUAL | Status: DC | PRN

## 2019-09-23 MED ORDER — SODIUM CHLORIDE 0.9 % IV SOLN
1.0000 g | INTRAVENOUS | Status: DC
  Administered 2019-09-23 – 2019-09-25 (×3): 1 g via INTRAVENOUS
  Filled 2019-09-23 (×3): qty 10

## 2019-09-23 MED ORDER — ASPIRIN 81 MG PO CHEW: 324.0000 mg | CHEWABLE_TABLET | Freq: Once | ORAL | Status: DC

## 2019-09-23 MED ORDER — IOHEXOL 350 MG/ML SOLN
100.0000 mL | Freq: Once | INTRAVENOUS | Status: AC | PRN
  Administered 2019-09-23: 100 mL via INTRAVENOUS

## 2019-09-23 MED ORDER — SODIUM CHLORIDE 0.9 % IV SOLN: INTRAVENOUS | Status: DC

## 2019-09-23 MED ORDER — ONDANSETRON HCL 4 MG/2ML IJ SOLN: 4.0000 mg | Freq: Four times a day (QID) | INTRAMUSCULAR | Status: DC | PRN

## 2019-09-23 MED ORDER — AMLODIPINE BESYLATE 10 MG PO TABS
10.0000 mg | ORAL_TABLET | Freq: Every day | ORAL | Status: DC
  Administered 2019-09-24 – 2019-09-25 (×2): 10 mg via ORAL
  Filled 2019-09-23 (×2): qty 1
  Filled 2019-09-23: qty 2
  Filled 2019-09-23: qty 1

## 2019-09-23 MED ORDER — ACETAMINOPHEN 325 MG PO TABS
650.0000 mg | ORAL_TABLET | ORAL | Status: DC | PRN
  Administered 2019-09-23: 650 mg via ORAL
  Filled 2019-09-23: qty 2

## 2019-09-23 MED ORDER — NITROGLYCERIN IN D5W 200-5 MCG/ML-% IV SOLN
0.0000 ug/min | INTRAVENOUS | Status: DC
  Administered 2019-09-23: 06:00:00 5 ug/min via INTRAVENOUS
  Filled 2019-09-23: qty 250

## 2019-09-23 MED ORDER — ENOXAPARIN SODIUM 40 MG/0.4ML ~~LOC~~ SOLN
40.0000 mg | SUBCUTANEOUS | Status: DC
  Administered 2019-09-23 – 2019-09-25 (×3): 40 mg via SUBCUTANEOUS
  Filled 2019-09-23 (×3): qty 0.4

## 2019-09-23 MED ORDER — GABAPENTIN 300 MG PO CAPS
300.0000 mg | ORAL_CAPSULE | Freq: Three times a day (TID) | ORAL | Status: DC
  Administered 2019-09-23 – 2019-09-25 (×8): 300 mg via ORAL
  Filled 2019-09-23: qty 3
  Filled 2019-09-23 (×7): qty 1

## 2019-09-23 MED ORDER — INSULIN ASPART 100 UNIT/ML ~~LOC~~ SOLN
0.0000 [IU] | Freq: Three times a day (TID) | SUBCUTANEOUS | Status: DC
  Administered 2019-09-23 – 2019-09-25 (×2): 1 [IU] via SUBCUTANEOUS

## 2019-09-23 MED ORDER — ATORVASTATIN CALCIUM 40 MG PO TABS
40.0000 mg | ORAL_TABLET | Freq: Every day | ORAL | Status: DC
  Administered 2019-09-23 – 2019-09-24 (×2): 40 mg via ORAL
  Filled 2019-09-23 (×2): qty 1

## 2019-09-23 MED ORDER — ASPIRIN EC 81 MG PO TBEC
81.0000 mg | DELAYED_RELEASE_TABLET | Freq: Every day | ORAL | Status: DC
  Administered 2019-09-24 – 2019-09-25 (×2): 81 mg via ORAL
  Filled 2019-09-23 (×3): qty 1

## 2019-09-23 MED ORDER — SENNOSIDES-DOCUSATE SODIUM 8.6-50 MG PO TABS
1.0000 | ORAL_TABLET | Freq: Two times a day (BID) | ORAL | Status: DC
  Administered 2019-09-23 – 2019-09-25 (×5): 1 via ORAL
  Filled 2019-09-23 (×5): qty 1

## 2019-09-23 MED ORDER — NITROGLYCERIN PEDIATRIC IV INFUSION 200 MCG/ML: 25.9750 ug/min | INTRAVENOUS | Status: DC

## 2019-09-23 NOTE — Plan of Care (Signed)
  Problem: Clinical Measurements: °Goal: Ability to maintain clinical measurements within normal limits will improve °Outcome: Not Progressing °  °Problem: Clinical Measurements: °Goal: Cardiovascular complication will be avoided °Outcome: Not Progressing °  °

## 2019-09-23 NOTE — ED Provider Notes (Signed)
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  Patient is an extremely poor historian which has been documented on multiple previous visits.  He states that he is having chest pain that feels like a pressure.  He is unable to tell me if it radiates.  He states he feels short of breath.  Unable to tell me if there are aggravating or alleviating factors.  He states he feels like he is congested.  States he has had dry cough.  He is unable to tell me if he has been around anyone with Covid.  Unknown if patient has had fever.  While examining patient, patient seems very concerned about the appearance of his feet.  He states that he has had dry, scaly, flaking skin for years and abnormal toenails.  States he has not seen his feet in "months".  States that his feet are numb and they have been numb for 3 years.  No calf swelling or calf tenderness.  It appears patient has a 4.6 cm thoracic aortic aneurysm.  He has seen cardiothoracic surgery once in the clinic in April 2018 and they did not feel he was a candidate for elective or emergent surgery due to his comorbidities.  Recommended outpatient PCP and cardiology follow-up for blood pressure management.  It does not appear that he has had any imaging of his aorta since 2018 in the ED.  Patient presents today extremely hypertensive.  He is unable to tell me when he last took blood pressure medications.  He is unable to tell me the medications he is supposed to be taking.  According to our records, medications were last refilled by his primary care provider Cammie Sickle, NP in May 2019.  At that time he was on amlodipine 10 mg daily, glipizide, Neurontin, atorvastatin, aspirin.  It appears patient also has a history of cocaine abuse.   He tells me that he last used cocaine 5 years ago.  Received aspirin with EMS.  ROS: See HPI Constitutional: no fever  Eyes: no drainage  ENT: no runny nose   Cardiovascular:   chest pain  Resp: no SOB  GI: no vomiting GU: no dysuria Integumentary: no rash  Allergy: no hives  Musculoskeletal: no leg swelling  Neurological: no slurred speech ROS otherwise negative  PAST MEDICAL HISTORY/PAST SURGICAL HISTORY:  Past Medical History:  Diagnosis Date  . Diabetes mellitus without complication ([place])   . Hypertension   . Renal disorder    pt stated that masses were found on his kidneys  . Renal insufficiency   . Stroke [facility]    r sided weakness, memory    MEDICATIONS:  Prior to Admission medications   Medication Sig Start Date End Date Taking? Authorizing Provider  amLODipine (NORVASC) 10 MG tablet Take 1 tablet (10 mg total) by mouth daily. 12/14/17   Dorena Dew, FNP  aspirin EC 81 MG tablet Take 1 tablet (81 mg total) by mouth daily. 12/14/17   Dorena Dew, FNP  atorvastatin (LIPITOR) 40 MG tablet Take 1 tablet (40 mg total) by mouth daily at 6 PM. 12/14/17   Dorena Dew, FNP  gabapentin (NEURONTIN) 300 MG capsule Take 1 capsule (300 mg total) by mouth 3 (three) times daily. 12/14/17   Dorena Dew, FNP  glipiZIDE (GLUCOTROL) 10  MG tablet Take 1 tablet (10 mg total) by mouth daily before breakfast. 12/14/17   Dorena Dew, FNP    ALLERGIES:  Allergies  Allergen Reactions  . Clonidine Derivatives Other (See Comments)    Pt reported the sweats and shakes  . Lisinopril Palpitations and Other (See Comments)    "made me real sick, made my heart rate scatter"    SOCIAL HISTORY:  Social History   Tobacco Use  . Smoking status: Former Smoker    Packs/day: 0.25    Years: 20.00    Pack years: 5.00    Types: Cigarettes  . Smokeless tobacco: Never Used  Substance Use Topics  . Alcohol use: No    FAMILY HISTORY: Family History  Problem Relation Age of  Onset  . Diabetes Mellitus II Mother   . Hypertension Mother   . Diabetes Mellitus II Sister   . Hypertension Sister     EXAM: BP (!) 173/121   Pulse 75   Temp 98.3 F (36.8 C) (Oral)   Resp (!) 22   Ht 6\' 9"  (2.057 m)   Wt 103.9 kg   SpO2 98%   BMI 24.55 kg/m  CONSTITUTIONAL: Alert and oriented and responds appropriately to questions.  Chronically ill-appearing. HEAD: Normocephalic EYES: Conjunctivae clear, pupils appear equal, EOM appear intact ENT: normal nose; moist mucous membranes NECK: Supple, normal ROM CARD: RRR; S1 and S2 appreciated; no murmurs, no clicks, no rubs, no gallops RESP: Normal chest excursion without splinting or tachypnea; breath sounds clear and equal bilaterally; no wheezes, no rhonchi, no rales, no hypoxia or respiratory distress, speaking full sentences ABD/GI: Normal bowel sounds; non-distended; soft, non-tender, no rebound, no guarding, no peritoneal signs, no hepatosplenomegaly BACK:  The back appears normal EXT: Normal ROM in all joints; no deformity noted, no edema; no cyanosis, no calf tenderness or calf swelling, 2+ radial and DP pulses bilaterally SKIN: Normal color for age and race; warm; no rash on exposed skin NEURO: Moves all extremities equally, extremely poor historian, speech is normal without aphasia or dysarthria, reports chronic numbness in the distal lower extremities that is unchanged PSYCH: The patient's mood and manner are appropriate.   MEDICAL DECISION MAKING: Patient here with complaints of chest pain.  Unfortunately he is an extremely poor historian which has been documented on multiple previous ED visits.  This seems to be a chronic issue for him likely due to his history of stroke.  His EKG shows no ischemic change.  He is very hypertensive here and has history of thoracic aortic aneurysm that has not been imaged since 2018.  Will obtain CTA of the chest, abdomen pelvis to evaluate for worsening aneurysm, dissection.  Will obtain  cardiac labs.  Also check urine drug screen given his history of cocaine abuse although he reports being sober for 5 years.  Will give home dose of his amlodipine here.  Patient also reports he feels congested.  Chest x-ray and Covid swab pending.  Patient may need admission if blood pressure continues to be uncontrolled.  Although I suspect his mentation is baseline given his blood pressure is so elevated I will obtain a head CT to evaluate for intracranial hemorrhage or other acute intracranial abnormalities.  ED PROGRESS: Patient's blood pressure is not improving with his home amlodipine.  Will give IV hydralazine.  CT head shows no acute abnormality.  Labs appear to be at patient's baseline.  First troponin is negative.  CT of the chest shows stable aneurysmal aortic root.  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  No dissection noted.  I feel patient needs admission for chest pain rule out and further blood pressure management.  Will discuss with medicine on-call for unassigned.   5:55 AM  Pt still having active chest pain.  Again having a hard time describing it to me.  Blood pressures in the 200s/130s after IV hydralazine.  Will start nitroglycerin infusion.  Urine drug screen pending.  His Covid test here is negative.     6:13 AM Discussed patient's case with hospitalist, Dr. Hal Hope.  I have recommended admission and patient (and family if present) agree with this plan. Admitting physician will place admission orders.   I reviewed all nursing notes, vitals, pertinent previous records and interpreted all EKGs, lab and urine results, imaging (as available).    EKG Interpretation  Date/Time:  Monday September 23 2019 04:02:10 EST Ventricular Rate:  75 PR Interval:    QRS Duration: 100 QT Interval:  407 QTC Calculation: 455 R Axis:   -10 Text Interpretation: Sinus rhythm with first degree AV block Anteroseptal infarct, old No significant change since last tracing  Confirmed by Blu Mcglaun, Cyril Mourning [phone]) on 09/23/2019 4:06:26 AM        CRITICAL CARE Performed by: Cyril Mourning Quin Mathenia   Total critical care time: 55 minutes  Critical care time was exclusive of separately billable procedures and treating other patients.  Critical care was necessary to treat or prevent imminent or life-threatening deterioration.  Critical care was time spent personally by me on the following activities: development of treatment plan with patient and/or surrogate as well as nursing, discussions with consultants, evaluation of patient's response to treatment, examination of patient, obtaining history from patient or surrogate, ordering and performing treatments and interventions, ordering and review of laboratory studies, ordering and review of radiographic studies, pulse oximetry and re-evaluation of patient's condition.   Daley Alarie was evaluated in Emergency Department on 09/23/2019 for the symptoms described in the history of present illness. He was evaluated in the context of the global COVID-19 pandemic, which necessitated consideration that the patient might be at risk for infection with the SARS-CoV-2 virus that causes COVID-19. Institutional protocols and algorithms that pertain to the evaluation of patients at risk for COVID-19 are in a state of rapid change based on information released by regulatory bodies including the CDC and federal and state organizations. These policies and algorithms were followed during the patient's care in the ED.  Patient was seen wearing N95, face shield, gloves.   Elisha Cooksey, Delice Bison, DO 09/23/19 [phone]

## 2019-09-23 NOTE — Progress Notes (Signed)
Patient HR drop to 36 with second degree HB, HR  go back up to 40-50 asymptomatic only c/o headache from nitro drip, 12 lead EKG done confirmed second degree HB . Cardiology PA notified, no new order given report given to night shift RN.

## 2019-09-23 NOTE — H&P (Addendum)
History and Physical    Javier Rivera [id number] DOB: 1960-09-10 DOA: 09/23/2019  Referring MD/NP/PA: Gean Birchwood, MD PCP: Patient, No Pcp Per  Patient coming from: Home via EMS  Chief Complaint: Shortness of breath and chest pain  I have personally briefly reviewed patient's old medical records in [place]   HPI: Javier Rivera is a 59 y.o. male with medical history significant of hypertension, CVA with residual right-sided weakness and 2017, DM type II, AAA, renal insufficiency, and polysubstance abuse.  Patient presents with complaints of shortness of breath and chest pain.  History is a little difficult to ascertain from the patient.  He states that he has been feeling bad for years.  Last night patient reports feeling as though he could not catch his breath and had chest pain on both sides.  He describes it as a pressure-like feeling.  Notes associated symptoms of sweats, being congested, intermittent dry cough, nausea, lower right quadrant abdominal pain, decreased urine output, and constipated.  He complains of food feeling as though it is just sitting on his stomach and not moving and has been having numbness in both feet for several years now.  He has not seen a doctor or been on medication since 2019 and states it is because he has not been able to get around.  Patient reports that he used to have a walker, but reports that it broke.  Denies any use of tobacco, alcohol, or illicit drugs in at least 4 to 5 years.  He reports that he has previously been evaluated for his thoracic abdominal aortic aneurysm and they had discussed surgery at one point in time.    In route with EMS patient blood pressure was documented at 200/40 and he received full dose aspirin and 0.8 mg of nitroglycerin.   ED Course: On admission into the emergency department patient was noted to be afebrile with mild tachypnea and blood pressures elevated up to 203/116.  Labs significant for WBC 3.6,  hemoglobin 17.4, BUN 12, creatinine 1.27, glucose 150, and high-sensitivity troponin negative.  Urinalysis was positive for nitrites, high specific gravity, few bacteria, and 6-10 WBCs.  COVID-19 and influenza screening were negative.  UDS negative for any acute abnormalities.  CT angiogram of the chest revealed an aneurysmal aortic root and abdominal aortic aneurysm measuring 3.3 cm.  Patient had been given amlodipine 10 mg p.o., hydralazine 10 mg IV, and started on a nitroglycerin drip.  TRH called to admit.  Review of Systems  Constitutional: Positive for diaphoresis and malaise/fatigue. Negative for fever.  HENT: Positive for congestion. Negative for ear discharge.   Eyes: Negative for photophobia and pain.  Respiratory: Positive for cough and shortness of breath. Negative for sputum production.   Cardiovascular: Positive for chest pain. Negative for leg swelling.  Gastrointestinal: Positive for abdominal pain, constipation and nausea. Negative for vomiting.  Genitourinary: Negative for frequency.  Musculoskeletal: Positive for myalgias. Negative for falls.  Skin:       Positive for dry skin  Neurological: Positive for sensory change (Numbness in both legs and feet chronic) and focal weakness (Right-sided weakness from previous stroke). Negative for loss of consciousness.  Endo/Heme/Allergies: Negative for polydipsia.  Psychiatric/Behavioral: Negative for substance abuse. The patient has insomnia.     Past Medical History:  Diagnosis Date  . Diabetes mellitus without complication ([place])   . Hypertension   . Renal disorder    pt stated that masses were found on his kidneys  . Renal insufficiency   .  Stroke [facility])    r sided weakness, memory    Past Surgical History:  Procedure Laterality Date  . KNEE SURGERY     bilateral     reports that he has quit smoking. His smoking use included cigarettes. He has a 5.00 pack-year smoking history. He has never used smokeless tobacco. He  reports that he does not drink alcohol or use drugs.  Allergies  Allergen Reactions  . Clonidine Derivatives Other (See Comments)    Pt reported the sweats and shakes  . Lisinopril Palpitations and Other (See Comments)    "made me real sick, made my heart rate scatter"    Family History  Problem Relation Age of Onset  . Diabetes Mellitus II Mother   . Hypertension Mother   . Diabetes Mellitus II Sister   . Hypertension Sister     Prior to Admission medications   Medication Sig Start Date End Date Taking? Authorizing Provider  amLODipine (NORVASC) 10 MG tablet Take 1 tablet (10 mg total) by mouth daily. Patient not taking: Reported on 09/23/2019 12/14/17   Dorena Dew, FNP  aspirin EC 81 MG tablet Take 1 tablet (81 mg total) by mouth daily. Patient not taking: Reported on 09/23/2019 12/14/17   Dorena Dew, FNP  atorvastatin (LIPITOR) 40 MG tablet Take 1 tablet (40 mg total) by mouth daily at 6 PM. Patient not taking: Reported on 09/23/2019 12/14/17   Dorena Dew, FNP  gabapentin (NEURONTIN) 300 MG capsule Take 1 capsule (300 mg total) by mouth 3 (three) times daily. Patient not taking: Reported on 09/23/2019 12/14/17   Dorena Dew, FNP  glipiZIDE (GLUCOTROL) 10 MG tablet Take 1 tablet (10 mg total) by mouth daily before breakfast. Patient not taking: Reported on 09/23/2019 12/14/17   Dorena Dew, FNP    Physical Exam:  Constitutional: Elderly male who appears to be in no acute distress at this time. Vitals:   09/23/19 0600 09/23/19 0615 09/23/19 0630 09/23/19 0645  BP: (!) 174/112 (!) 163/106 (!) 178/119 (!) 176/117  Pulse: 66 66 64 71  Resp: 17 15 16  (!) 23  Temp:      TempSrc:      SpO2: 99% 98% 98% 97%  Weight:      Height:       Eyes: PERRL, lids and conjunctivae normal ENMT: Mucous membranes are dry. Posterior pharynx clear of any exudate or lesions. Poor dentition.  Neck: normal, supple, no masses, no thyromegaly.  No JVD. Respiratory: clear to  auscultation bilaterally, no wheezing, no crackles. Normal respiratory effort. No accessory muscle use.  Patient able to talk in full and complete sentences. Cardiovascular: Bradycardic, no murmurs / rubs / gallops. No extremity edema. 2+ pedal pulses. No carotid bruits.  Abdomen: Tenderness palpation of the upper quadrant, no masses palpated. No hepatosplenomegaly. Bowel sounds positive.  Musculoskeletal: no clubbing / cyanosis. No joint deformity upper and lower extremities. Good ROM, no contractures. Normal muscle tone.  Skin: Severely dry skin of the bilateral legs. Neurologic: CN 2-12 grossly intact. Sensation abnormal, DTR normal.  Strength 4/5 on the right upper and lower extremity and 5 out of 5 on the left. Psychiatric: Normal judgment and insight. Alert and oriented x 3.  Depressed mood.     Labs on Admission: I have personally reviewed following labs and imaging studies  CBC: Recent Labs  Lab 09/23/19 0406 09/23/19 0426  WBC 3.6*  --   NEUTROABS 1.5*  --   HGB 17.4* 18.0*  HCT 53.6* 53.0*  MCV 88.4  --   PLT 173  --    Basic Metabolic Panel: Recent Labs  Lab 09/23/19 0406 09/23/19 0426  NA 137 138  K 3.9 4.0  CL 104 104  CO2 22  --   GLUCOSE 150* 143*  BUN 12 15  CREATININE 1.27* 1.30*  CALCIUM 8.5*  --    GFR: Estimated Creatinine Clearance: 86.1 mL/min (A) (by C-G formula based on SCr of 1.3 mg/dL (H)). Liver Function Tests: Recent Labs  Lab 09/23/19 0406  AST 24  ALT 23  ALKPHOS 52  BILITOT 0.8  PROT 7.1  ALBUMIN 3.7   No results for input(s): LIPASE, AMYLASE in the last 168 hours. No results for input(s): AMMONIA in the last 168 hours. Coagulation Profile: No results for input(s): INR, PROTIME in the last 168 hours. Cardiac Enzymes: No results for input(s): CKTOTAL, CKMB, CKMBINDEX, TROPONINI in the last 168 hours. BNP (last 3 results) No results for input(s): PROBNP in the last 8760 hours. HbA1C: No results for input(s): HGBA1C in the last  72 hours. CBG: Recent Labs  Lab 09/23/19 0406  GLUCAP 145*   Lipid Profile: No results for input(s): CHOL, HDL, LDLCALC, TRIG, CHOLHDL, LDLDIRECT in the last 72 hours. Thyroid Function Tests: No results for input(s): TSH, T4TOTAL, FREET4, T3FREE, THYROIDAB in the last 72 hours. Anemia Panel: No results for input(s): VITAMINB12, FOLATE, FERRITIN, TIBC, IRON, RETICCTPCT in the last 72 hours. Urine analysis:    Component Value Date/Time   COLORURINE YELLOW 09/23/2019 0541   APPEARANCEUR HAZY (A) 09/23/2019 0541   LABSPEC 1.045 (H) 09/23/2019 0541   PHURINE 6.0 09/23/2019 0541   GLUCOSEU NEGATIVE 09/23/2019 0541   HGBUR NEGATIVE 09/23/2019 0541   BILIRUBINUR NEGATIVE 09/23/2019 0541   BILIRUBINUR negative 11/30/2017 1422   KETONESUR NEGATIVE 09/23/2019 0541   PROTEINUR 30 (A) 09/23/2019 0541   UROBILINOGEN 2.0 (A) 11/30/2017 1422   UROBILINOGEN 1.0 10/30/2017 1030   NITRITE POSITIVE (A) 09/23/2019 0541   LEUKOCYTESUR NEGATIVE 09/23/2019 0541   Sepsis Labs: Recent Results (from the past 240 hour(s))  Respiratory Panel by RT PCR (Flu A&B, Covid) - Nasopharyngeal Swab     Status: None   Collection Time: 09/23/19  4:13 AM   Specimen: Nasopharyngeal Swab  Result Value Ref Range Status   SARS Coronavirus 2 by RT PCR NEGATIVE NEGATIVE Final    Comment: (NOTE) SARS-CoV-2 target nucleic acids are NOT DETECTED. The SARS-CoV-2 RNA is generally detectable in upper respiratoy specimens during the acute phase of infection. The lowest concentration of SARS-CoV-2 viral copies this assay can detect is 131 copies/mL. A negative result does not preclude SARS-Cov-2 infection and should not be used as the sole basis for treatment or other patient management decisions. A negative result may occur with  improper specimen collection/handling, submission of specimen other than nasopharyngeal swab, presence of viral mutation(s) within the areas targeted by this assay, and inadequate number of viral  copies (<131 copies/mL). A negative result must be combined with clinical observations, patient history, and epidemiological information. The expected result is Negative. Fact Sheet for Patients:  PinkCheek.be Fact Sheet for Healthcare Providers:  GravelBags.it This test is not yet ap proved or cleared by the [place] FDA and  has been authorized for detection and/or diagnosis of SARS-CoV-2 by FDA under an Emergency Use Authorization (EUA). This EUA will remain  in effect (meaning this test can be used) for the duration of the COVID-19 declaration under Section 564(b)(1) of the Act,  21 U.S.C. section 360bbb-3(b)(1), unless the authorization is terminated or revoked sooner.    Influenza A by PCR NEGATIVE NEGATIVE Final   Influenza B by PCR NEGATIVE NEGATIVE Final    Comment: (NOTE) The Xpert Xpress SARS-CoV-2/FLU/RSV assay is intended as an aid in  the diagnosis of influenza from Nasopharyngeal swab specimens and  should not be used as a sole basis for treatment. Nasal washings and  aspirates are unacceptable for Xpert Xpress SARS-CoV-2/FLU/RSV  testing. Fact Sheet for Patients: PinkCheek.be Fact Sheet for Healthcare Providers: GravelBags.it This test is not yet approved or cleared by the [place] FDA and  has been authorized for detection and/or diagnosis of SARS-CoV-2 by  FDA under an Emergency Use Authorization (EUA). This EUA will remain  in effect (meaning this test can be used) for the duration of the  Covid-19 declaration under Section 564(b)(1) of the Act, 21  U.S.C. section 360bbb-3(b)(1), unless the authorization is  terminated or revoked. Performed at [facility], [place] [address] [place], [place] [address]      Radiological Exams on Admission: CT Head Wo Contrast  Result Date: 09/23/2019 CLINICAL DATA:  Elevated blood pressure. EXAM:  CT HEAD WITHOUT CONTRAST TECHNIQUE: Contiguous axial images were obtained from the base of the skull through the vertex without intravenous contrast. COMPARISON:  Head CT 10/27/2016, brain MRI 10/28/2016 FINDINGS: Brain: Brain volume is normal for age. No intracranial hemorrhage, mass effect, or midline shift. No hydrocephalus. The basilar cisterns are patent. Moderate chronic small vessel ischemia, age advanced. No evidence of territorial infarct or acute ischemia. No extra-axial or intracranial fluid collection. Vascular: No hyperdense vessel or unexpected calcification. Skull: No fracture or focal lesion. Sinuses/Orbits: Paranasal sinuses and mastoid air cells are clear. The visualized orbits are unremarkable. Other: None. IMPRESSION: 1. No acute intracranial abnormality. 2. Chronic small vessel ischemia, similar to prior exam. Electronically Signed   By: Keith Rake M.D.   On: 09/23/2019 05:29   DG Chest Portable 1 View  Result Date: 09/23/2019 CLINICAL DATA:  Chest pain and shortness of breath. EXAM: PORTABLE CHEST 1 VIEW COMPARISON:  Radiograph 04/23/2017 FINDINGS: The cardiomediastinal contours are normal. Borderline cardiomegaly. Pulmonary vasculature is normal. No consolidation, pleural effusion, or pneumothorax. No acute osseous abnormalities are seen. IMPRESSION: Borderline cardiomegaly without acute abnormality. Electronically Signed   By: Keith Rake M.D.   On: 09/23/2019 04:19   CT Angio Chest/Abd/Pel for Dissection W and/or Wo Contrast  Result Date: 09/23/2019 CLINICAL DATA:  Follow-up thoracic aortic aneurysm. Chest pain and shortness of breath with marked hypertension. EXAM: CT ANGIOGRAPHY CHEST, ABDOMEN AND PELVIS TECHNIQUE: Multidetector CT imaging through the chest, abdomen and pelvis was performed using the standard protocol during bolus administration of intravenous contrast. Multiplanar reconstructed images and MIPs were obtained and reviewed to evaluate the vascular anatomy.  CONTRAST:  168mL OMNIPAQUE IOHEXOL 350 MG/ML SOLN COMPARISON:  04/23/2017 chest CTA FINDINGS: CTA CHEST FINDINGS Cardiovascular: Noncontrast phase shows no acute intramural hematoma in the thoracic aorta. There is atherosclerotic calcification of the coronaries and aorta. Borderline heart size with possible left ventricular hypertrophy. Enlarged aortic root with aortic diameters below: Valve 28 mm Sinuses of Valsalva 45 mm Sino-tubular junction 42 mm Ascending segment 42 mm Arch 35 mm Descending segment 34 mm. No aortic dissection or inflammatory wall thickening. The great vessels are widely patent. Limited opacification of the pulmonary arteries with no apparent filling defect. Mediastinum/Nodes: Negative for adenopathy or inflammation. Lungs/Pleura: Mild right lower lobe scarring related to endplate osteophytes. There  is no edema, consolidation, effusion, or pneumothorax. Musculoskeletal: No acute finding Review of the MIP images confirms the above findings. CTA ABDOMEN AND PELVIS FINDINGS VASCULAR Aorta: Atheromatous plaque most notable in the infrarenal segment where there is 33 mm fusiform dilatation. Negative for dissection or inflammatory wall thickening. Celiac: No significant finding when accounting for motion. SMA: Motion artifact. No branch occlusion, beading, or aneurysm. Mild atherosclerotic plaque seen proximally. Renals: Minimal atheromatous changes. IMA: Patent Inflow: Heavy involvement of atherosclerotic plaque. Ectatic common iliac arteries measuring up to 23 mm in diameter on the left and 20 mm on the right. Atheromatous occlusion of the left hypogastric artery. No acute finding or flow reducing stenosis Veins: Negative in the arterial phase Review of the MIP images confirms the above findings. NON-VASCULAR Hepatobiliary: Peripheral discontinuous nodular enhancement in the left lobe measuring 32 mm, consistent with hemangioma. A tiny focus of hypervascularity in the right liver could be hemangioma  or perfusion anomaly.No evidence of biliary obstruction or stone. Pancreas: Unremarkable. Spleen: Unremarkable. Adrenals/Urinary Tract: Bilateral adrenal adenomas, the largest in the right gland measuring 24 mm. Bilateral renal cysts, measuring up to 7 cm from the lower pole left kidney. No hydronephrosis or stone. Unremarkable bladder, which is collapsed. Stomach/Bowel:  No obstruction. No evidence of bowel inflammation. Lymphatic: No mass or adenopathy. Reproductive:No pathologic findings. Other: No ascites or pneumoperitoneum. Musculoskeletal: No acute abnormalities. Review of the MIP images confirms the above findings. IMPRESSION: 1. No acute finding. 2. 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 Recommend annual imaging followup by CTA or MRA. This recommendation follows 2010 ACCF/AHA/AATS/ACR/ASA/SCA/SCAI/SIR/STS/SVM Guidelines for the Diagnosis and Management of Patients with Thoracic Aortic Disease. Circulation. 2010; [id number]. Aortic aneurysm NOS (ICD10-I71.9) 3. 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 This recommendation follows ACR consensus guidelines: White Paper of the ACR Incidental Findings Committee II on Vascular Findings. J Am Coll Radiol 2013; [id number] 4. Aortic Atherosclerosis (ICD10-I70.0). Electronically Signed   By: Monte Fantasia M.D.   On: 09/23/2019 05:39    EKG: Independently reviewed.  Sinus rhythm at 75 bpm with first-degree AV heart block.  Assessment/Plan Chest pain and dyspnea: Acute.  Patient presents with complaints of chest pain and shortness of breath.  Reports pain is located both sides of his chest feels like pressure.  Imaging did note signs of borderline cardiomegaly along with atherosclerotic disease of the coronaries and aorta.  EKG did not show any significant ischemic changes and initial troponin was negative. -Admit to a progressive bed -Continue to trend cardiac troponin -Check lipid  panel -Check echocardiogram   -Continue aspirin -Cardiology consulted to evaluate the patient  Hypertensive urgency: Acute on chronic.  Patient presents with systolic blood pressures elevated into the 200s.  Patient had not been on any blood pressure medications, but previously prescribed amlodipine. -Restart amlodipine -Continue nitroglycerin drip     Abnormal UA with suspect UTI: Present on admission.  Urinalysis noted to have positive nitrites, few bacteria, and 6-10 WBCs.  Patient reports decreased urine output and abdominal discomfort. -Follow-up urine culture  -Rocephin IV   Diabetes mellitus type 2 with neuropathy: Patient initially presented with glucose elevated at 150.  Patient previously prescribed glipizide but does not appear to be taking this medicine.  Last hemoglobin A1c from 10/2017 noted to be 6.4.  Patient reports having decreased sensation of lower extremities which could be multifactorial in nature. -Hypoglycemic protocol -Check hemoglobin A1c -Restart gabapentin -CBGs before every meal with sensitive SSI  Leukopenia: Acute on chronic.  WBC 3.6 which appears near previous. -Continue to monitor   Polycythemia: Hemoglobin elevated at 17.4.  Question of asked that this may be secondary to dehydration or related with previous history of tobacco abuse. -Recheck in a.m.  Constipation: Patient reports that he has been constipated and has not had a bowel movement in some time. -Senokot-S   Hyperlipidemia: Chronic.  Patient has been on atorvastatin for at least 2 years now.  CTA noted multiple areas of atherosclerotic disease of the aorta and coronaries. -Follow-up lipid panel  -Restart atorvastatin  Renal insufficiency: Chronic.  Creatinine 1.27 which appears slightly above patient's baseline.  He reports decreased p.o. intake.  Elevated specific gravity suggest patient may be a little dehydrated. -Gentle IV fluids -Recheck BMP in a.m.   Abdominal aortic aneurysm and  aortic root dilation: Acute on chronic.  Findings as noted on CT angiogram.  -Recommend yearly annual screening of the aortic root dilatation with CT/MRI and follow-up  ultrasound in 3 years up to a abdominal aortic aneurysm.    History of polysubstance abuse: Patient reports that he has not used any cocaine or tobacco at least 4-5 years now since his previous stroke.  Urine drug screen was negative. -Encouraged continued cessation of tobacco and illicit drugs  DVT prophylaxis: Lovenox Code Status: Full Family Communication: No family present at bedside Disposition Plan: Likely discharge home once medically stable Consults called: None  Admission status: Observation  Norval Morton MD [facility] Pager [phone]   If 7PM-7AM, please contact night-coverage [email or web address] Password [facility]  09/23/2019, 7:09 AM

## 2019-09-23 NOTE — Discharge Planning (Signed)
[facility] consulted regarding PCP and medication assistance.  Pt is registered as having active Medicaid Insurance with $3.00 Rx co-pay and PCP as follows: Primary Care Provider: [facility]  Address: [address] [place], [place] [address]  Contact: [place]  Telephone: [phone]

## 2019-09-23 NOTE — ED Notes (Signed)
Pt transported to CT ?

## 2019-09-23 NOTE — Progress Notes (Signed)
PT Cancellation Note  Patient Details Name: Javier Rivera MRN: [id number] DOB: Jun 06, 1961   Cancelled Treatment:    Reason Eval/Treat Not Completed: Medical issues which prohibited therapy patient continues to have uncontrolled BP despite efforts of medical team- most recent values 176/117, 179/111, 174/118 at rest. Per MD notes seems to have been symptomatic as well. Will continue to follow acutely.    Windell Norfolk, DPT, PN1   Supplemental Physical Therapist [facility]    Pager [phone] Acute Rehab Office [phone]

## 2019-09-23 NOTE — ED Notes (Signed)
Patient bloodsugar is 110 mg

## 2019-09-23 NOTE — ED Triage Notes (Signed)
Pt brought to ED by [facility] from home for c/o left side cp since last night 0.8 mg NTG, 324 mg ASA given by EMS pta with no relief. BP 200/40, HR 92, RR 18, SPO2 97% RA.

## 2019-09-23 NOTE — Progress Notes (Signed)
Patient's HR in the 40s, telemetry showed wenckebach AV block. He has chronic dizziness. Last syncope was more than a year ago. He does not need temp pacing wire tonight. Received single dose of metoprolol 50mg . Continue observation

## 2019-09-23 NOTE — Progress Notes (Signed)
  Echocardiogram 2D Echocardiogram has been performed.  Javier Rivera 09/23/2019, 12:17 PM

## 2019-09-23 NOTE — Consult Note (Addendum)
Cardiology Consultation:   Patient ID: Javier Rivera MRN: [id number]; DOB: [birth year]  Admit date: 09/23/2019 Date of Consult: 09/23/2019  Primary Care Provider: Patient, No Pcp Per Primary Cardiologist: No primary care provider on file.  Primary Electrophysiologist:  None    Patient Profile:   Javier Rivera is a 59 y.o. 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 Tamala Julian.  History of Present Illness:   Javier Rivera presents for 2 day history of chest pain described as pressure-like with associated shortness of breath. The pain began while he was lying down and has been constant since onset. He denies any exacerbating or alleviating factors. He thought his symptoms were more related to his sinus congestion and non-productive cough. He presented to the ED when the shortness of breath seemed to worsen last night.  Patient has not been on any medications for his blood pressure or diabetes since 2019. He has not smoked or used illicit substances since his stroke.  On my evaluation, he primarily complains of a severe, throbbing headache. His chest pain has not changed with medications he has received thus far.   He denies fevers, chills, syncope, sputum production, vomiting, lower extremity swelling.   Heart Pathway Score:     Past Medical History:  Diagnosis Date  . Diabetes mellitus without complication ([place])   . Hypertension   . Renal disorder    pt stated that masses were found on his kidneys  . Renal insufficiency   . Stroke [facility])    r sided weakness, memory    Past Surgical History:  Procedure Laterality Date  . KNEE SURGERY     bilateral     Home Medications:  Prior to Admission medications   Medication Sig Start Date End Date Taking? Authorizing Provider  amLODipine (NORVASC) 10 MG tablet Take 1 tablet (10 mg total) by  mouth daily. Patient not taking: Reported on 09/23/2019 12/14/17   Dorena Dew, FNP  aspirin EC 81 MG tablet Take 1 tablet (81 mg total) by mouth daily. Patient not taking: Reported on 09/23/2019 12/14/17   Dorena Dew, FNP  atorvastatin (LIPITOR) 40 MG tablet Take 1 tablet (40 mg total) by mouth daily at 6 PM. Patient not taking: Reported on 09/23/2019 12/14/17   Dorena Dew, FNP  gabapentin (NEURONTIN) 300 MG capsule Take 1 capsule (300 mg total) by mouth 3 (three) times daily. Patient not taking: Reported on 09/23/2019 12/14/17   Dorena Dew, FNP  glipiZIDE (GLUCOTROL) 10 MG tablet Take 1 tablet (10 mg total) by mouth daily before breakfast. Patient not taking: Reported on 09/23/2019 12/14/17   Dorena Dew, FNP    Inpatient Medications: Scheduled Meds: . [START ON 09/24/2019] amLODipine  10 mg Oral Daily  . [START ON 09/24/2019] aspirin EC  81 mg Oral Daily  . atorvastatin  40 mg Oral q1800  . enoxaparin (LOVENOX) injection  40 mg Subcutaneous Q24H  . gabapentin  300 mg Oral TID  . insulin aspart  0-9 Units Subcutaneous TID WC  . senna-docusate  1 tablet Oral BID   Continuous Infusions: . sodium chloride 75 mL/hr at 09/23/19 1156  . cefTRIAXone (ROCEPHIN)  IV    . nitroGLYCERIN 5 mcg/min (09/23/19 0612)   PRN Meds: acetaminophen, ondansetron (ZOFRAN) IV  Allergies:    Allergies  Allergen Reactions  .  Clonidine Derivatives Other (See Comments)    Pt reported the sweats and shakes  . Lisinopril Palpitations and Other (See Comments)    "made me real sick, made my heart rate scatter"    Social History:   Social History   Socioeconomic History  . Marital status: Single    Spouse name: Not on file  . Number of children: Not on file  . Years of education: Not on file  . Highest education level: Not on file  Occupational History  . Not on file  Tobacco Use  . Smoking status: Former Smoker    Packs/day: 0.25    Years: 20.00    Pack years: 5.00    Types:  Cigarettes  . Smokeless tobacco: Never Used  Substance and Sexual Activity  . Alcohol use: No  . 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 Sexual activity: Not on file  Other Topics Concern  . Not on file  Social History Narrative  . Not on file   Social Determinants of Health   Financial Resource Strain:   . Difficulty of Paying Living Expenses: Not on file  Food Insecurity:   . Worried About Charity fundraiser in the Last Year: Not on file  . Ran Out of Food in the Last Year: Not on file  Transportation Needs:   . Lack of Transportation (Medical): Not on file  . Lack of Transportation (Non-Medical): Not on file  Physical Activity:   . Days of Exercise per Week: Not on file  . Minutes of Exercise per Session: Not on file  Stress:   . Feeling of Stress : Not on file  Social Connections:   . Frequency of Communication with Friends and Family: Not on file  . Frequency of Social Gatherings with Friends and Family: Not on file  . Attends Religious Services: Not on file  . Active Member of Clubs or Organizations: Not on file  . Attends Archivist Meetings: Not on file  . Marital Status: Not on file  Intimate Partner Violence:   . Fear of Current or Ex-Partner: Not on file  . Emotionally Abused: Not on file  . Physically Abused: Not on file  . Sexually Abused: Not on file    Family History:    Family History  Problem Relation Age of Onset  . Diabetes Mellitus II Mother   . Hypertension Mother   . Diabetes Mellitus II Sister   . Hypertension Sister      ROS:  Please see the history of present illness.  All other ROS reviewed and negative.     Physical Exam/Data:   Vitals:   09/23/19 0630 09/23/19 0645 09/23/19 0730 09/23/19 0800  BP: (!) 178/119 (!) 176/117 (!) 179/111 (!) 174/118  Pulse: 64 71  65  Resp: 16 (!) 23 18 15   Temp:      TempSrc:      SpO2: 98% 97%  98%  Weight:      Height:       No intake or output data in the 24  hours ending 09/23/19 1202 Last 3 Weights 09/23/2019 11/30/2017 10/30/2017  Weight (lbs) 229 lb 0.9 oz 229 lb 225 lb  Weight (kg) 103.9 kg 103.874 kg 102.059 kg     Body mass index is 24.55 kg/m.  General:  Chronically ill appearing, appears older than stated age, NAD HEENT: clear discharge from both eyes.  Lymph: no adenopathy Neck: no  JVD Endocrine:  No thryomegaly Vascular: No carotid bruits; FA pulses 2+ bilaterally without bruits  Cardiac:  normal S1, S2; RRR; no murmur  Lungs:  clear to auscultation bilaterally, no wheezing, rhonchi or rales  Abd: soft, nontender, no hepatomegaly  Ext: no edema Musculoskeletal:  No deformities, BLE strength normal and equal Skin: warm and dry  Neuro:  CNs 2-12 intact, no focal abnormalities noted Psych:  Normal affect   EKG:  The EKG was personally reviewed and demonstrates:  NSR, 1st degree AV block   Telemetry:  NSR   Relevant CV Studies: TTE pending   Laboratory Data:  High Sensitivity Troponin:   Recent Labs  Lab 09/23/19 0406 09/23/19 0735  TROPONINIHS 6 5     Chemistry Recent Labs  Lab 09/23/19 0406 09/23/19 0426  NA 137 138  K 3.9 4.0  CL 104 104  CO2 22  --   GLUCOSE 150* 143*  BUN 12 15  CREATININE 1.27* 1.30*  CALCIUM 8.5*  --   GFRNONAA >60  --   GFRAA >60  --   ANIONGAP 11  --     Recent Labs  Lab 09/23/19 0406  PROT 7.1  ALBUMIN 3.7  AST 24  ALT 23  ALKPHOS 52  BILITOT 0.8   Hematology Recent Labs  Lab 09/23/19 0406 09/23/19 0426  WBC 3.6*  --   RBC 6.06*  --   HGB 17.4* 18.0*  HCT 53.6* 53.0*  MCV 88.4  --   MCH 28.7  --   MCHC 32.5  --   RDW 13.2  --   PLT 173  --    BNPNo results for input(s): BNP, PROBNP in the last 168 hours.  DDimer No results for input(s): DDIMER in the last 168 hours.   Radiology/Studies:  CT Head Wo Contrast  Result Date: 09/23/2019 CLINICAL DATA:  Elevated blood pressure. EXAM: CT HEAD WITHOUT CONTRAST TECHNIQUE: Contiguous axial images were obtained from  the base of the skull through the vertex without intravenous contrast. COMPARISON:  Head CT 10/27/2016, brain MRI 10/28/2016 FINDINGS: Brain: Brain volume is normal for age. No intracranial hemorrhage, mass effect, or midline shift. No hydrocephalus. The basilar cisterns are patent. Moderate chronic small vessel ischemia, age advanced. No evidence of territorial infarct or acute ischemia. No extra-axial or intracranial fluid collection. Vascular: No hyperdense vessel or unexpected calcification. Skull: No fracture or focal lesion. Sinuses/Orbits: Paranasal sinuses and mastoid air cells are clear. The visualized orbits are unremarkable. Other: None. IMPRESSION: 1. No acute intracranial abnormality. 2. Chronic small vessel ischemia, similar to prior exam. Electronically Signed   By: Keith Rake M.D.   On: 09/23/2019 05:29   DG Chest Portable 1 View  Result Date: 09/23/2019 CLINICAL DATA:  Chest pain and shortness of breath. EXAM: PORTABLE CHEST 1 VIEW COMPARISON:  Radiograph 04/23/2017 FINDINGS: The cardiomediastinal contours are normal. Borderline cardiomegaly. Pulmonary vasculature is normal. No consolidation, pleural effusion, or pneumothorax. No acute osseous abnormalities are seen. IMPRESSION: Borderline cardiomegaly without acute abnormality. Electronically Signed   By: Keith Rake M.D.   On: 09/23/2019 04:19   CT Angio Chest/Abd/Pel for Dissection W and/or Wo Contrast  Result Date: 09/23/2019 CLINICAL DATA:  Follow-up thoracic aortic aneurysm. Chest pain and shortness of breath with marked hypertension. EXAM: CT ANGIOGRAPHY CHEST, ABDOMEN AND PELVIS TECHNIQUE: Multidetector CT imaging through the chest, abdomen and pelvis was performed using the standard protocol during bolus administration of intravenous contrast. Multiplanar reconstructed images and MIPs were obtained and reviewed to evaluate  the vascular anatomy. CONTRAST:  148mL OMNIPAQUE IOHEXOL 350 MG/ML SOLN COMPARISON:  04/23/2017  chest CTA FINDINGS: CTA CHEST FINDINGS Cardiovascular: Noncontrast phase shows no acute intramural hematoma in the thoracic aorta. There is atherosclerotic calcification of the coronaries and aorta. Borderline heart size with possible left ventricular hypertrophy. Enlarged aortic root with aortic diameters below: Valve 28 mm Sinuses of Valsalva 45 mm Sino-tubular junction 42 mm Ascending segment 42 mm Arch 35 mm Descending segment 34 mm. No aortic dissection or inflammatory wall thickening. The great vessels are widely patent. Limited opacification of the pulmonary arteries with no apparent filling defect. Mediastinum/Nodes: Negative for adenopathy or inflammation. Lungs/Pleura: Mild right lower lobe scarring related to endplate osteophytes. There is no edema, consolidation, effusion, or pneumothorax. Musculoskeletal: No acute finding Review of the MIP images confirms the above findings. CTA ABDOMEN AND PELVIS FINDINGS VASCULAR Aorta: Atheromatous plaque most notable in the infrarenal segment where there is 33 mm fusiform dilatation. Negative for dissection or inflammatory wall thickening. Celiac: No significant finding when accounting for motion. SMA: Motion artifact. No branch occlusion, beading, or aneurysm. Mild atherosclerotic plaque seen proximally. Renals: Minimal atheromatous changes. IMA: Patent Inflow: Heavy involvement of atherosclerotic plaque. Ectatic common iliac arteries measuring up to 23 mm in diameter on the left and 20 mm on the right. Atheromatous occlusion of the left hypogastric artery. No acute finding or flow reducing stenosis Veins: Negative in the arterial phase Review of the MIP images confirms the above findings. NON-VASCULAR Hepatobiliary: Peripheral discontinuous nodular enhancement in the left lobe measuring 32 mm, consistent with hemangioma. A tiny focus of hypervascularity in the right liver could be hemangioma or perfusion anomaly.No evidence of biliary obstruction or stone.  Pancreas: Unremarkable. Spleen: Unremarkable. Adrenals/Urinary Tract: Bilateral adrenal adenomas, the largest in the right gland measuring 24 mm. Bilateral renal cysts, measuring up to 7 cm from the lower pole left kidney. No hydronephrosis or stone. Unremarkable bladder, which is collapsed. Stomach/Bowel:  No obstruction. No evidence of bowel inflammation. Lymphatic: No mass or adenopathy. Reproductive:No pathologic findings. Other: No ascites or pneumoperitoneum. Musculoskeletal: No acute abnormalities. Review of the MIP images confirms the above findings. IMPRESSION: 1. No acute finding. 2. 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 Recommend annual imaging followup by CTA or MRA. This recommendation follows 2010 ACCF/AHA/AATS/ACR/ASA/SCA/SCAI/SIR/STS/SVM Guidelines for the Diagnosis and Management of Patients with Thoracic Aortic Disease. Circulation. 2010; [id number]. Aortic aneurysm NOS (ICD10-I71.9) 3. 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 This recommendation follows ACR consensus guidelines: White Paper of the ACR Incidental Findings Committee II on Vascular Findings. J Am Coll Radiol 2013; [id number] 4. Aortic Atherosclerosis (ICD10-I70.0). Electronically Signed   By: Monte Fantasia M.D.   On: 09/23/2019 05:39    HEAR Score (for undifferentiated chest pain):   3     Assessment and Plan:   1. Chest pain: negative troponins and EKG reassuring, but with ongoing chest pain and significant risk factors, will pursue coronary CTA for ischemic work-up. Suspect symptoms may be related to hypertensive urgency and should improve with better blood pressure control. With elevated LDL and history of CVA, agree with resuming aspirin and statin therapy.  2. Hypertensive urgency: likely has been severely elevated chronically. He has been initiated on Amlodipine 10 mg. Given history of diabetes, will also start ACE inhibitor.       For  questions or updates, please contact [place] Please consult [email or web address] for contact info under     Signed,  Delice Bison, DO  09/23/2019 12:02 PM   I have examined the patient and reviewed assessment and plan and discussed with patient.  Agree with above as stated.    Patient with multiple risk factors for heart disease including hypertension and diabetes.  He has not been compliant with medications.  We will start low-dose ACE inhibitor.  He has been started on high-dose amlodipine as well.  CT scan negative for aortic dissection.  Given his chest pain and risk factors, we will plan for coronary CTA.  We will plan to do this tomorrow given the contrast dose he received today.  I personally reviewed his echo images.  His LV function looks normal.  He has mild aortic insufficiency.  He does have a dilated aortic root but it does not appear to be to the point where intervention is needed.  Will await final report.  Long-term, compliance with his medicine regimen will be paramount.    Larae Grooms

## 2019-09-24 ENCOUNTER — Inpatient Hospital Stay (HOSPITAL_COMMUNITY): Payer: Medicaid Other

## 2019-09-24 DIAGNOSIS — N179 Acute kidney failure, unspecified: Secondary | ICD-10-CM | POA: Diagnosis present

## 2019-09-24 DIAGNOSIS — Z833 Family history of diabetes mellitus: Secondary | ICD-10-CM | POA: Diagnosis not present

## 2019-09-24 DIAGNOSIS — Z20822 Contact with and (suspected) exposure to covid-19: Secondary | ICD-10-CM | POA: Diagnosis present

## 2019-09-24 DIAGNOSIS — Z9114 Patient's other noncompliance with medication regimen: Secondary | ICD-10-CM | POA: Diagnosis not present

## 2019-09-24 DIAGNOSIS — I351 Nonrheumatic aortic (valve) insufficiency: Secondary | ICD-10-CM | POA: Diagnosis present

## 2019-09-24 DIAGNOSIS — Z87891 Personal history of nicotine dependence: Secondary | ICD-10-CM | POA: Diagnosis not present

## 2019-09-24 DIAGNOSIS — I69351 Hemiplegia and hemiparesis following cerebral infarction affecting right dominant side: Secondary | ICD-10-CM | POA: Diagnosis not present

## 2019-09-24 DIAGNOSIS — I714 Abdominal aortic aneurysm, without rupture: Secondary | ICD-10-CM | POA: Diagnosis present

## 2019-09-24 DIAGNOSIS — K5901 Slow transit constipation: Secondary | ICD-10-CM | POA: Diagnosis present

## 2019-09-24 DIAGNOSIS — D751 Secondary polycythemia: Secondary | ICD-10-CM | POA: Diagnosis present

## 2019-09-24 DIAGNOSIS — I160 Hypertensive urgency: Secondary | ICD-10-CM | POA: Diagnosis present

## 2019-09-24 DIAGNOSIS — Z888 Allergy status to other drugs, medicaments and biological substances status: Secondary | ICD-10-CM | POA: Diagnosis not present

## 2019-09-24 DIAGNOSIS — E118 Type 2 diabetes mellitus with unspecified complications: Secondary | ICD-10-CM | POA: Diagnosis not present

## 2019-09-24 DIAGNOSIS — I77810 Thoracic aortic ectasia: Secondary | ICD-10-CM | POA: Diagnosis not present

## 2019-09-24 DIAGNOSIS — D72819 Decreased white blood cell count, unspecified: Secondary | ICD-10-CM | POA: Diagnosis present

## 2019-09-24 DIAGNOSIS — N259 Disorder resulting from impaired renal tubular function, unspecified: Secondary | ICD-10-CM | POA: Diagnosis not present

## 2019-09-24 DIAGNOSIS — I712 Thoracic aortic aneurysm, without rupture: Secondary | ICD-10-CM

## 2019-09-24 DIAGNOSIS — I2510 Atherosclerotic heart disease of native coronary artery without angina pectoris: Secondary | ICD-10-CM | POA: Diagnosis present

## 2019-09-24 DIAGNOSIS — R0789 Other chest pain: Secondary | ICD-10-CM | POA: Diagnosis present

## 2019-09-24 DIAGNOSIS — Z8249 Family history of ischemic heart disease and other diseases of the circulatory system: Secondary | ICD-10-CM | POA: Diagnosis not present

## 2019-09-24 DIAGNOSIS — E1122 Type 2 diabetes mellitus with diabetic chronic kidney disease: Secondary | ICD-10-CM | POA: Diagnosis present

## 2019-09-24 DIAGNOSIS — N390 Urinary tract infection, site not specified: Secondary | ICD-10-CM | POA: Diagnosis present

## 2019-09-24 DIAGNOSIS — R079 Chest pain, unspecified: Secondary | ICD-10-CM

## 2019-09-24 DIAGNOSIS — I10 Essential (primary) hypertension: Secondary | ICD-10-CM | POA: Diagnosis not present

## 2019-09-24 DIAGNOSIS — N182 Chronic kidney disease, stage 2 (mild): Secondary | ICD-10-CM | POA: Diagnosis present

## 2019-09-24 DIAGNOSIS — E1159 Type 2 diabetes mellitus with other circulatory complications: Secondary | ICD-10-CM | POA: Diagnosis not present

## 2019-09-24 DIAGNOSIS — E785 Hyperlipidemia, unspecified: Secondary | ICD-10-CM | POA: Diagnosis present

## 2019-09-24 DIAGNOSIS — I129 Hypertensive chronic kidney disease with stage 1 through stage 4 chronic kidney disease, or unspecified chronic kidney disease: Secondary | ICD-10-CM | POA: Diagnosis present

## 2019-09-24 DIAGNOSIS — E1140 Type 2 diabetes mellitus with diabetic neuropathy, unspecified: Secondary | ICD-10-CM | POA: Diagnosis present

## 2019-09-24 DIAGNOSIS — R519 Headache, unspecified: Secondary | ICD-10-CM | POA: Diagnosis present

## 2019-09-24 LAB — CBC WITH DIFFERENTIAL/PLATELET
Abs Immature Granulocytes: 0.01 10*3/uL (ref 0.00–0.07)
Basophils Absolute: 0 10*3/uL (ref 0.0–0.1)
Basophils Relative: 0 %
Eosinophils Absolute: 0 10*3/uL (ref 0.0–0.5)
Eosinophils Relative: 1 %
HCT: 50.6 % (ref 39.0–52.0)
Hemoglobin: 16.2 g/dL (ref 13.0–17.0)
Immature Granulocytes: 0 %
Lymphocytes Relative: 40 %
Lymphs Abs: 1.3 10*3/uL (ref 0.7–4.0)
MCH: 28.3 pg (ref 26.0–34.0)
MCHC: 32 g/dL (ref 30.0–36.0)
MCV: 88.5 fL (ref 80.0–100.0)
Monocytes Absolute: 0.3 10*3/uL (ref 0.1–1.0)
Monocytes Relative: 9 %
Neutro Abs: 1.6 10*3/uL — ABNORMAL LOW (ref 1.7–7.7)
Neutrophils Relative %: 50 %
Platelets: 151 10*3/uL (ref 150–400)
RBC: 5.72 MIL/uL (ref 4.22–5.81)
RDW: 13.4 % (ref 11.5–15.5)
WBC: 3.2 10*3/uL — ABNORMAL LOW (ref 4.0–10.5)
nRBC: 0 % (ref 0.0–0.2)

## 2019-09-24 LAB — GLUCOSE, CAPILLARY
Glucose-Capillary: 115 mg/dL — ABNORMAL HIGH (ref 70–99)
Glucose-Capillary: 145 mg/dL — ABNORMAL HIGH (ref 70–99)
Glucose-Capillary: 89 mg/dL (ref 70–99)
Glucose-Capillary: 135 mg/dL — ABNORMAL HIGH (ref 70–99)

## 2019-09-24 LAB — BASIC METABOLIC PANEL
Anion gap: 10 (ref 5–15)
BUN: 12 mg/dL (ref 6–20)
CO2: 22 mmol/L (ref 22–32)
Calcium: 8.5 mg/dL — ABNORMAL LOW (ref 8.9–10.3)
Chloride: 103 mmol/L (ref 98–111)
Creatinine, Ser: 1.17 mg/dL (ref 0.61–1.24)
GFR calc Af Amer: >60 mL/min (ref >60)
GFR calc non Af Amer: >60 mL/min (ref >60)
Glucose, Bld: 125 mg/dL — ABNORMAL HIGH (ref 70–99)
Potassium: 3.8 mmol/L (ref 3.5–5.1)
Sodium: 135 mmol/L (ref 135–145)

## 2019-09-24 MED ORDER — LISINOPRIL 20 MG PO TABS
20.0000 mg | ORAL_TABLET | Freq: Every day | ORAL | Status: DC
  Administered 2019-09-24: 11:00:00 20 mg via ORAL
  Filled 2019-09-24: qty 1

## 2019-09-24 MED ORDER — IOHEXOL 350 MG/ML SOLN
100.0000 mL | Freq: Once | INTRAVENOUS | Status: AC | PRN
  Administered 2019-09-24: 100 mL via INTRAVENOUS

## 2019-09-24 MED ORDER — HYDROCHLOROTHIAZIDE 12.5 MG PO CAPS
12.5000 mg | ORAL_CAPSULE | Freq: Every day | ORAL | Status: DC
  Administered 2019-09-24 – 2019-09-25 (×2): 12.5 mg via ORAL
  Filled 2019-09-24 (×2): qty 1

## 2019-09-24 MED ORDER — NITROGLYCERIN 0.4 MG SL SUBL
SUBLINGUAL_TABLET | SUBLINGUAL | Status: AC
  Administered 2019-09-24: 12:00:00 0.8 mg
  Filled 2019-09-24: qty 2

## 2019-09-24 MED ORDER — METOPROLOL TARTRATE 50 MG PO TABS: 50.0000 mg | ORAL_TABLET | Freq: Two times a day (BID) | ORAL | Status: DC

## 2019-09-24 MED ORDER — LOSARTAN POTASSIUM 25 MG PO TABS
25.0000 mg | ORAL_TABLET | Freq: Every day | ORAL | Status: DC
  Administered 2019-09-25: 09:00:00 25 mg via ORAL
  Filled 2019-09-24: qty 1

## 2019-09-24 NOTE — Progress Notes (Signed)
[facility]  PROGRESS NOTE  Braylan Klever [id number] DOB: May 24, 1961 DOA: 09/23/2019 PCP: Patient, No Pcp Per   Brief HPI:   59 year old male with history of hypertension, CVA with residual right-sided weakness, diabetes mellitus type 2, AAA, renal insufficiency, polysubstance abuse came with shortness of breath and chest pain.  Patient felt pain like pressure in his chest.  Symptoms included sweating, congested, intermittent dry cough, nausea, lower right quadrant abdominal pain, decreased urine output, constipation.  In the ED he was found to have blood pressure of 203/116.  Patient started on IV nitroglycerin.  CTA chest revealed aneurysmal aortic root and abdominal aortic aneurysm measuring 3.3 cm.  Cardiology was consulted.    Subjective   Patient seen and examined, currently complains of headache, he has been on IV nitroglycerin gtt. since yesterday.   Assessment/Plan:     1. Hypertensive urgency-patient presented with systolic BP in [id number].  Started on IV nitroglycerin gtt.  Will wean off nitroglycerin.  Blood pressure is better controlled.  Continue amlodipine 10 mg daily, will increase lisinopril to 20 mg daily, also start low-dose HCTZ 12.5 mg p.o. daily.  Will avoid giving metoprolol as patient's heart rate dropped to 30s after 1 dose of metoprolol 50 mg. 2. Chest pain-resolved.  Likely due to above.  Cardiology has been consulted.  Plan for coronary CTA today for further ischemic evaluation. 3. Aortic aneurysm-abdominal aortic aneurysm measuring 3.3 cm, recommend follow-up ultrasound in 3 years. 4. Acute kidney injury on CKD stage II-patient presented with creatinine of 1.27, improved to 1.17 after IV fluids.  He has been started on HCTZ 12.5 and lisinopril 20 mg daily for hypertensive urgency as above.  Follow BMP in a.m.  Anticipate mild elevation of creatinine due to these medications. 5. Hyperlipidemia-continue atorvastatin.  LDL is 162. 6. Polycythemia-patient's  hemoglobin/metabolic was elevated yesterday, today H&H is 16.2/50.6.  Chronic, likely from tobacco abuse.     SpO2: 97 %     Lab Results  Component Value Date   SARSCOV2NAA NEGATIVE 09/23/2019     CBG: Recent Labs  Lab 09/23/19 0406 09/23/19 1227 09/23/19 1626 09/23/19 2128 09/24/19 0619  GLUCAP 145* 110* 145* 111* 115*    CBC: Recent Labs  Lab 09/23/19 0406 09/23/19 0426 09/24/19 0627  WBC 3.6*  --  3.2*  NEUTROABS 1.5*  --  1.6*  HGB 17.4* 18.0* 16.2  HCT 53.6* 53.0* 50.6  MCV 88.4  --  88.5  PLT 173  --  [id number]    Basic Metabolic Panel: Recent Labs  Lab 09/23/19 0406 09/23/19 0426 09/24/19 0627  NA 137 138 135  K 3.9 4.0 3.8  CL 104 104 103  CO2 22  --  22  GLUCOSE 150* 143* 125*  BUN 12 15 12   CREATININE 1.27* 1.30* 1.17  CALCIUM 8.5*  --  8.5*     Liver Function Tests: Recent Labs  Lab 09/23/19 0406  AST 24  ALT 23  ALKPHOS 52  BILITOT 0.8  PROT 7.1  ALBUMIN 3.7      DVT prophylaxis: Lovenox  Code Status: Full code  Family Communication: No family at bedside  Disposition Plan: likely home when medically ready for discharge       Scheduled medications:  . amLODipine  10 mg Oral Daily  . aspirin EC  81 mg Oral Daily  . atorvastatin  40 mg Oral q1800  . enoxaparin (LOVENOX) injection  40 mg Subcutaneous Q24H  . gabapentin  300 mg Oral TID  . hydrochlorothiazide  12.5 mg Oral Daily  . insulin aspart  0-9 Units Subcutaneous TID WC  . lisinopril  20 mg Oral Daily  . senna-docusate  1 tablet Oral BID    Consultants:  Cardiology  Procedures:    Antibiotics:   Anti-infectives (From admission, onward)   Start     Dose/Rate Route Frequency Ordered Stop   09/23/19 0900  cefTRIAXone (ROCEPHIN) 1 g in sodium chloride 0.9 % 100 mL IVPB     1 g 200 mL/hr over 30 Minutes Intravenous Every 24 hours 09/23/19 0813         Objective   Vitals:   09/23/19 1911 09/23/19 2130 09/24/19 0531 09/24/19 0749  BP: (!) 143/83 (!)  146/95 (!) 140/92 (!) 166/100  Pulse: (!) 108 60 64 63  Resp: 20 20 18 20   Temp: 98.2 F (36.8 C) 98.1 F (36.7 C) 98.3 F (36.8 C) 97.9 F (36.6 C)  TempSrc: Oral Oral Oral Oral  SpO2: 98% 99% 99% 97%  Weight:   117.1 kg   Height:        Intake/Output Summary (Last 24 hours) at 09/24/2019 1020 Last data filed at 09/24/2019 0811 Gross per 24 hour  Intake 1729.98 ml  Output 750 ml  Net 979.98 ml    02/14 1901 - 02/16 0700 In: [id number] [P.O.:240; I.V.:1140] Out: 750 [Urine:750]  Filed Weights   09/23/19 0355 09/24/19 0531  Weight: 103.9 kg 117.1 kg    Physical Examination:    General-appears in no acute distress  Heart-S1-S2, regular, no murmur auscultated  Lungs-clear to auscultation bilaterally, no wheezing or crackles auscultated  Abdomen-soft, nontender, no organomegaly  Extremities-no edema in the lower extremities  Neuro-alert, oriented x3, no focal deficit noted    Data Reviewed:   Recent Results (from the past 240 hour(s))  Respiratory Panel by RT PCR (Flu A&B, Covid) - Nasopharyngeal Swab     Status: None   Collection Time: 09/23/19  4:13 AM   Specimen: Nasopharyngeal Swab  Result Value Ref Range Status   SARS Coronavirus 2 by RT PCR NEGATIVE NEGATIVE Final    Comment: (NOTE) SARS-CoV-2 target nucleic acids are NOT DETECTED. The SARS-CoV-2 RNA is generally detectable in upper respiratoy specimens during the acute phase of infection. The lowest concentration of SARS-CoV-2 viral copies this assay can detect is 131 copies/mL. A negative result does not preclude SARS-Cov-2 infection and should not be used as the sole basis for treatment or other patient management decisions. A negative result may occur with  improper specimen collection/handling, submission of specimen other than nasopharyngeal swab, presence of viral mutation(s) within the areas targeted by this assay, and inadequate number of viral copies (<131 copies/mL). A negative result must be  combined with clinical observations, patient history, and epidemiological information. The expected result is Negative. Fact Sheet for Patients:  PinkCheek.be Fact Sheet for Healthcare Providers:  GravelBags.it This test is not yet ap proved or cleared by the [place] FDA and  has been authorized for detection and/or diagnosis of SARS-CoV-2 by FDA under an Emergency Use Authorization (EUA). This EUA will remain  in effect (meaning this test can be used) for the duration of the COVID-19 declaration under Section 564(b)(1) of the Act, 21 U.S.C. section 360bbb-3(b)(1), unless the authorization is terminated or revoked sooner.    Influenza A by PCR NEGATIVE NEGATIVE Final   Influenza B by PCR NEGATIVE NEGATIVE Final    Comment: (NOTE) The Xpert Xpress SARS-CoV-2/FLU/RSV assay is intended as an aid in  the  diagnosis of influenza from Nasopharyngeal swab specimens and  should not be used as a sole basis for treatment. Nasal washings and  aspirates are unacceptable for Xpert Xpress SARS-CoV-2/FLU/RSV  testing. Fact Sheet for Patients: PinkCheek.be Fact Sheet for Healthcare Providers: GravelBags.it This test is not yet approved or cleared by the [place] FDA and  has been authorized for detection and/or diagnosis of SARS-CoV-2 by  FDA under an Emergency Use Authorization (EUA). This EUA will remain  in effect (meaning this test can be used) for the duration of the  Covid-19 declaration under Section 564(b)(1) of the Act, 21  U.S.C. section 360bbb-3(b)(1), unless the authorization is  terminated or revoked. Performed at [facility], [place] [address], [place], [place] [address]   Urine culture     Status: Abnormal (Preliminary result)   Collection Time: 09/23/19  5:41 AM   Specimen: Urine, Random  Result Value Ref Range Status   Specimen Description URINE,  RANDOM  Final   Special Requests   Final    NONE Performed at [facility], [place] [address], [place], [place] [address]    Culture >=100,000 COLONIES/mL GRAM NEGATIVE RODS (A)  Final   Report Status PENDING  Incomplete    No results for input(s): LIPASE, AMYLASE in the last 168 hours. No results for input(s): AMMONIA in the last 168 hours.  Cardiac Enzymes: No results for input(s): CKTOTAL, CKMB, CKMBINDEX, TROPONINI in the last 168 hours. BNP (last 3 results) No results for input(s): BNP in the last 8760 hours.  ProBNP (last 3 results) No results for input(s): PROBNP in the last 8760 hours.  Studies:  CT Head Wo Contrast  Result Date: 09/23/2019 CLINICAL DATA:  Elevated blood pressure. EXAM: CT HEAD WITHOUT CONTRAST TECHNIQUE: Contiguous axial images were obtained from the base of the skull through the vertex without intravenous contrast. COMPARISON:  Head CT 10/27/2016, brain MRI 10/28/2016 FINDINGS: Brain: Brain volume is normal for age. No intracranial hemorrhage, mass effect, or midline shift. No hydrocephalus. The basilar cisterns are patent. Moderate chronic small vessel ischemia, age advanced. No evidence of territorial infarct or acute ischemia. No extra-axial or intracranial fluid collection. Vascular: No hyperdense vessel or unexpected calcification. Skull: No fracture or focal lesion. Sinuses/Orbits: Paranasal sinuses and mastoid air cells are clear. The visualized orbits are unremarkable. Other: None. IMPRESSION: 1. No acute intracranial abnormality. 2. Chronic small vessel ischemia, similar to prior exam. Electronically Signed   By: Keith Rake M.D.   On: 09/23/2019 05:29   DG Chest Portable 1 View  Result Date: 09/23/2019 CLINICAL DATA:  Chest pain and shortness of breath. EXAM: PORTABLE CHEST 1 VIEW COMPARISON:  Radiograph 04/23/2017 FINDINGS: The cardiomediastinal contours are normal. Borderline cardiomegaly. Pulmonary vasculature is normal. No consolidation,  pleural effusion, or pneumothorax. No acute osseous abnormalities are seen. IMPRESSION: Borderline cardiomegaly without acute abnormality. Electronically Signed   By: Keith Rake M.D.   On: 09/23/2019 04:19   ECHOCARDIOGRAM COMPLETE  Result Date: 09/23/2019    ECHOCARDIOGRAM REPORT   Patient Name:   CARDON TREVORROW Date of Exam: 09/23/2019 Medical Rec #:  [id number]      Height:       81.0 in Accession #:    [id number]     Weight:       229.1 lb Date of Birth:  July 03, 1961       BSA:          2.46 m Patient Age:    82 years  BP:           167/117 mmHg Patient Gender: M              HR:           68 bpm. Exam Location:  Inpatient Procedure: 2D Echo, Cardiac Doppler and Color Doppler Indications:    R07.9* Chest pain, unspecified  History:        Patient has prior history of Echocardiogram examinations, most                 recent 10/19/2015. Stroke; Risk Factors:Hypertension and                 Diabetes. Renal Disease.  Sonographer:    Tiffany Dance Referring Phys: [id number] RONDELL A SMITH IMPRESSIONS  1. Left ventricular ejection fraction, by estimation, is 60 to 65%. The left ventricle has normal function. The left ventricle has no regional wall motion abnormalities. There is mild asymmetric left ventricular hypertrophy of the basal-septal segment. Left ventricular diastolic parameters are consistent with Grade I diastolic dysfunction (impaired relaxation).  2. Right ventricular systolic function is normal. The right ventricular size is normal. Tricuspid regurgitation signal is inadequate for assessing PA pressure.  3. The mitral valve is grossly normal. No evidence of mitral valve regurgitation. No evidence of mitral stenosis.  4. The aortic valve is tricuspid. Aortic valve regurgitation is mild. No aortic stenosis is present.  5. 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 Ascending aortic diameter up to 47 mm. Better characterized on recent CTA and would defer to those measurements. Aneurysm of the aortic root and  ascending aorta.  6. The inferior vena cava is normal in size with greater than 50% respiratory variability, suggesting right atrial pressure of 3 mmHg. Comparison(s): A prior study was performed on 10/19/2015. No significant change from prior study. FINDINGS  Left Ventricle: Left ventricular ejection fraction, by estimation, is 60 to 65%. The left ventricle has normal function. The left ventricle has no regional wall motion abnormalities. The left ventricular internal cavity size was normal in size. There is  mild asymmetric left ventricular hypertrophy of the basal-septal segment. Left ventricular diastolic parameters are consistent with Grade I diastolic dysfunction (impaired relaxation). Right Ventricle: The right ventricular size is normal. No increase in right ventricular wall thickness. Right ventricular systolic function is normal. Tricuspid regurgitation signal is inadequate for assessing PA pressure. Left Atrium: Left atrial size was normal in size. Right Atrium: Right atrial size was normal in size. Pericardium: Trivial pericardial effusion is present. Presence of pericardial fat pad. Mitral Valve: The mitral valve is grossly normal. No evidence of mitral valve regurgitation. No evidence of mitral valve stenosis. Tricuspid Valve: The tricuspid valve is grossly normal. Tricuspid valve regurgitation is not demonstrated. Aortic Valve: The aortic valve is tricuspid. Aortic valve regurgitation is mild. Aortic regurgitation PHT measures 734 msec. No aortic stenosis is present. There is mild calcification of the aortic valve. Pulmonic Valve: The pulmonic valve was grossly normal. Pulmonic valve regurgitation is not visualized. 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 Ascending aortic diameter up to 47 mm. Better characterized on recent CTA and would defer to those measurements. Aortic dilatation noted. There is an aneurysm involving the aortic root and ascending aorta. Venous: The inferior vena cava is normal in  size with greater than 50% respiratory variability, suggesting right atrial pressure of 3 mmHg. IAS/Shunts: No atrial level shunt detected by color flow Doppler.  LEFT VENTRICLE PLAX 2D LVIDd:  5.17 cm LVIDs:         3.69 cm LV PW:         1.17 cm LV IVS:        1.31 cm LVOT diam:     2.50 cm LV SV:         88.85 ml LV SV Index:   28.76 LVOT Area:     4.91 cm  RIGHT VENTRICLE            IVC RV Basal diam:  3.17 cm    IVC diam: 1.53 cm RV Mid diam:    2.36 cm RV S prime:     9.03 cm/s TAPSE (M-mode): 1.9 cm LEFT ATRIUM              Index       RIGHT ATRIUM           Index LA diam:        5.20 cm  2.12 cm/m  RA Area:     19.30 cm LA Vol (A2C):   103.0 ml 41.91 ml/m RA Volume:   55.80 ml  22.70 ml/m LA Vol (A4C):   42.9 ml  17.46 ml/m LA Biplane Vol: 67.2 ml  27.34 ml/m  AORTIC VALVE LVOT Vmax:   101.00 cm/s LVOT Vmean:  62.900 cm/s LVOT VTI:    0.181 m AI PHT:      734 msec  AORTA Ao Root diam: 4.50 cm Ao Asc diam:  4.70 cm MITRAL VALVE MV Area (PHT): 2.42 cm    SHUNTS MV Decel Time: 313 msec    Systemic VTI:  0.18 m MV E velocity: 36.90 cm/s  Systemic Diam: 2.50 cm MV A velocity: 74.70 cm/s MV E/A ratio:  0.49 Eleonore Chiquito MD Electronically signed by Eleonore Chiquito MD Signature Date/Time: 09/23/2019/3:00:38 PM    Final    CT Angio Chest/Abd/Pel for Dissection W and/or Wo Contrast  Result Date: 09/23/2019 CLINICAL DATA:  Follow-up thoracic aortic aneurysm. Chest pain and shortness of breath with marked hypertension. EXAM: CT ANGIOGRAPHY CHEST, ABDOMEN AND PELVIS TECHNIQUE: Multidetector CT imaging through the chest, abdomen and pelvis was performed using the standard protocol during bolus administration of intravenous contrast. Multiplanar reconstructed images and MIPs were obtained and reviewed to evaluate the vascular anatomy. CONTRAST:  171mL OMNIPAQUE IOHEXOL 350 MG/ML SOLN COMPARISON:  04/23/2017 chest CTA FINDINGS: CTA CHEST FINDINGS Cardiovascular: Noncontrast phase shows no acute intramural  hematoma in the thoracic aorta. There is atherosclerotic calcification of the coronaries and aorta. Borderline heart size with possible left ventricular hypertrophy. Enlarged aortic root with aortic diameters below: Valve 28 mm Sinuses of Valsalva 45 mm Sino-tubular junction 42 mm Ascending segment 42 mm Arch 35 mm Descending segment 34 mm. No aortic dissection or inflammatory wall thickening. The great vessels are widely patent. Limited opacification of the pulmonary arteries with no apparent filling defect. Mediastinum/Nodes: Negative for adenopathy or inflammation. Lungs/Pleura: Mild right lower lobe scarring related to endplate osteophytes. There is no edema, consolidation, effusion, or pneumothorax. Musculoskeletal: No acute finding Review of the MIP images confirms the above findings. CTA ABDOMEN AND PELVIS FINDINGS VASCULAR Aorta: Atheromatous plaque most notable in the infrarenal segment where there is 33 mm fusiform dilatation. Negative for dissection or inflammatory wall thickening. Celiac: No significant finding when accounting for motion. SMA: Motion artifact. No branch occlusion, beading, or aneurysm. Mild atherosclerotic plaque seen proximally. Renals: Minimal atheromatous changes. IMA: Patent Inflow: Heavy involvement of atherosclerotic plaque. Ectatic common iliac arteries measuring up to  23 mm in diameter on the left and 20 mm on the right. Atheromatous occlusion of the left hypogastric artery. No acute finding or flow reducing stenosis Veins: Negative in the arterial phase Review of the MIP images confirms the above findings. NON-VASCULAR Hepatobiliary: Peripheral discontinuous nodular enhancement in the left lobe measuring 32 mm, consistent with hemangioma. A tiny focus of hypervascularity in the right liver could be hemangioma or perfusion anomaly.No evidence of biliary obstruction or stone. Pancreas: Unremarkable. Spleen: Unremarkable. Adrenals/Urinary Tract: Bilateral adrenal adenomas, the  largest in the right gland measuring 24 mm. Bilateral renal cysts, measuring up to 7 cm from the lower pole left kidney. No hydronephrosis or stone. Unremarkable bladder, which is collapsed. Stomach/Bowel:  No obstruction. No evidence of bowel inflammation. Lymphatic: No mass or adenopathy. Reproductive:No pathologic findings. Other: No ascites or pneumoperitoneum. Musculoskeletal: No acute abnormalities. Review of the MIP images confirms the above findings. IMPRESSION: 1. No acute finding. 2. 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 Recommend annual imaging followup by CTA or MRA. This recommendation follows 2010 ACCF/AHA/AATS/ACR/ASA/SCA/SCAI/SIR/STS/SVM Guidelines for the Diagnosis and Management of Patients with Thoracic Aortic Disease. Circulation. 2010; [id number]. Aortic aneurysm NOS (ICD10-I71.9) 3. 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 This recommendation follows ACR consensus guidelines: White Paper of the ACR Incidental Findings Committee II on Vascular Findings. J Am Coll Radiol 2013; [id number] 4. Aortic Atherosclerosis (ICD10-I70.0). Electronically Signed   By: Monte Fantasia M.D.   On: 09/23/2019 05:39     Admission status: Inpatient: Based on patients clinical presentation and evaluation of above clinical data, I have made determination that patient meets Inpatient criteria at this time.   Oswald Hillock   [facility] If 7PM-7AM, please contact night-coverage at [email or web address], Office  [phone]  password [email or web address]  09/24/2019, 10:20 AM  LOS: 0 days

## 2019-09-24 NOTE — Progress Notes (Addendum)
Progress Note  Patient Name: Javier Rivera Date of Encounter: 09/24/2019  Primary Cardiologist: No primary care provider on file.   Subjective   Chest pain mildly improved, shortness of breath feels about the same. Currently complains of headache, nurse working to titrate off nitro gtt.   Inpatient Medications    Scheduled Meds: . amLODipine  10 mg Oral Daily  . aspirin EC  81 mg Oral Daily  . atorvastatin  40 mg Oral q1800  . enoxaparin (LOVENOX) injection  40 mg Subcutaneous Q24H  . gabapentin  300 mg Oral TID  . insulin aspart  0-9 Units Subcutaneous TID WC  . lisinopril  10 mg Oral Daily  . senna-docusate  1 tablet Oral BID   Continuous Infusions: . sodium chloride 75 mL/hr at 09/24/19 0541  . cefTRIAXone (ROCEPHIN)  IV Stopped (09/23/19 1238)  . nitroGLYCERIN 5 mcg/min (09/23/19 0612)   PRN Meds: acetaminophen, ondansetron (ZOFRAN) IV   Vital Signs    Vitals:   09/23/19 1626 09/23/19 1911 09/23/19 2130 09/24/19 0531  BP: 136/89 (!) 143/83 (!) 146/95 (!) 140/92  Pulse: 63 (!) 108 60 64  Resp: 18 20 20 18   Temp: 98.4 F (36.9 C) 98.2 F (36.8 C) 98.1 F (36.7 C) 98.3 F (36.8 C)  TempSrc: Oral Oral Oral Oral  SpO2: 98% 98% 99% 99%  Weight:    117.1 kg  Height:        Intake/Output Summary (Last 24 hours) at 09/24/2019 0748 Last data filed at 09/24/2019 0300 Gross per 24 hour  Intake 1489.98 ml  Output 750 ml  Net 739.98 ml   Last 3 Weights 09/24/2019 09/23/2019 11/30/2017  Weight (lbs) 258 lb 2.5 oz 229 lb 0.9 oz 229 lb  Weight (kg) 117.1 kg 103.9 kg 103.874 kg      Telemetry    Currently in sinus brady. Overnight with heart rate in 40s, 2nd degree mobitz I  - Personally Reviewed  ECG    No new tracings - Personally Reviewed  Physical Exam   GEN: No acute distress.   Neck: No JVD Cardiac: RRR, no murmurs, rubs, or gallops.  Respiratory: Clear to auscultation bilaterally. GI: Soft, nontender, non-distended  MS: No edema; No  deformity. Neuro:  Nonfocal  Psych: Normal affect   Labs    High Sensitivity Troponin:   Recent Labs  Lab 09/23/19 0406 09/23/19 0735  TROPONINIHS 6 5      Chemistry Recent Labs  Lab 09/23/19 0406 09/23/19 0426 09/24/19 0627  NA 137 138 135  K 3.9 4.0 3.8  CL 104 104 103  CO2 22  --  22  GLUCOSE 150* 143* 125*  BUN 12 15 12   CREATININE 1.27* 1.30* 1.17  CALCIUM 8.5*  --  8.5*  PROT 7.1  --   --   ALBUMIN 3.7  --   --   AST 24  --   --   ALT 23  --   --   ALKPHOS 52  --   --   BILITOT 0.8  --   --   GFRNONAA >60  --  >60  GFRAA >60  --  >60  ANIONGAP 11  --  10     Hematology Recent Labs  Lab 09/23/19 0406 09/23/19 0426 09/24/19 0627  WBC 3.6*  --  3.2*  RBC 6.06*  --  5.72  HGB 17.4* 18.0* 16.2  HCT 53.6* 53.0* 50.6  MCV 88.4  --  88.5  MCH 28.7  --  28.3  MCHC 32.5  --  32.0  RDW 13.2  --  13.4  PLT 173  --  151    BNPNo results for input(s): BNP, PROBNP in the last 168 hours.   DDimer No results for input(s): DDIMER in the last 168 hours.   Radiology    CT Head Wo Contrast  Result Date: 09/23/2019 CLINICAL DATA:  Elevated blood pressure. EXAM: CT HEAD WITHOUT CONTRAST TECHNIQUE: Contiguous axial images were obtained from the base of the skull through the vertex without intravenous contrast. COMPARISON:  Head CT 10/27/2016, brain MRI 10/28/2016 FINDINGS: Brain: Brain volume is normal for age. No intracranial hemorrhage, mass effect, or midline shift. No hydrocephalus. The basilar cisterns are patent. Moderate chronic small vessel ischemia, age advanced. No evidence of territorial infarct or acute ischemia. No extra-axial or intracranial fluid collection. Vascular: No hyperdense vessel or unexpected calcification. Skull: No fracture or focal lesion. Sinuses/Orbits: Paranasal sinuses and mastoid air cells are clear. The visualized orbits are unremarkable. Other: None. IMPRESSION: 1. No acute intracranial abnormality. 2. Chronic small vessel ischemia,  similar to prior exam. Electronically Signed   By: Keith Rake M.D.   On: 09/23/2019 05:29   DG Chest Portable 1 View  Result Date: 09/23/2019 CLINICAL DATA:  Chest pain and shortness of breath. EXAM: PORTABLE CHEST 1 VIEW COMPARISON:  Radiograph 04/23/2017 FINDINGS: The cardiomediastinal contours are normal. Borderline cardiomegaly. Pulmonary vasculature is normal. No consolidation, pleural effusion, or pneumothorax. No acute osseous abnormalities are seen. IMPRESSION: Borderline cardiomegaly without acute abnormality. Electronically Signed   By: Keith Rake M.D.   On: 09/23/2019 04:19   ECHOCARDIOGRAM COMPLETE  Result Date: 09/23/2019    ECHOCARDIOGRAM REPORT   Patient Name:   Javier Rivera Date of Exam: 09/23/2019 Medical Rec #:  [id number]      Height:       81.0 in Accession #:    [id number]     Weight:       229.1 lb Date of Birth:  07-05-1961       BSA:          2.46 m Patient Age:    59 years       BP:           167/117 mmHg Patient Gender: M              HR:           68 bpm. Exam Location:  Inpatient Procedure: 2D Echo, Cardiac Doppler and Color Doppler Indications:    R07.9* Chest pain, unspecified  History:        Patient has prior history of Echocardiogram examinations, most                 recent 10/19/2015. Stroke; Risk Factors:Hypertension and                 Diabetes. Renal Disease.  Sonographer:    Tiffany Dance Referring Phys: [id number] Javier Rivera IMPRESSIONS  1. Left ventricular ejection fraction, by estimation, is 60 to 65%. The left ventricle has normal function. The left ventricle has no regional wall motion abnormalities. There is mild asymmetric left ventricular hypertrophy of the basal-septal segment. Left ventricular diastolic parameters are consistent with Grade I diastolic dysfunction (impaired relaxation).  2. Right ventricular systolic function is normal. The right ventricular size is normal. Tricuspid regurgitation signal is inadequate for assessing PA pressure.  3.  The mitral valve is grossly normal. No evidence of mitral valve regurgitation. No evidence  of mitral stenosis.  4. The aortic valve is tricuspid. Aortic valve regurgitation is mild. No aortic stenosis is present.  5. 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 Ascending aortic diameter up to 47 mm. Better characterized on recent CTA and would defer to those measurements. Aneurysm of the aortic root and ascending aorta.  6. The inferior vena cava is normal in size with greater than 50% respiratory variability, suggesting right atrial pressure of 3 mmHg. Comparison(s): A prior study was performed on 10/19/2015. No significant change from prior study. FINDINGS  Left Ventricle: Left ventricular ejection fraction, by estimation, is 60 to 65%. The left ventricle has normal function. The left ventricle has no regional wall motion abnormalities. The left ventricular internal cavity size was normal in size. There is  mild asymmetric left ventricular hypertrophy of the basal-septal segment. Left ventricular diastolic parameters are consistent with Grade I diastolic dysfunction (impaired relaxation). Right Ventricle: The right ventricular size is normal. No increase in right ventricular wall thickness. Right ventricular systolic function is normal. Tricuspid regurgitation signal is inadequate for assessing PA pressure. Left Atrium: Left atrial size was normal in size. Right Atrium: Right atrial size was normal in size. Pericardium: Trivial pericardial effusion is present. Presence of pericardial fat pad. Mitral Valve: The mitral valve is grossly normal. No evidence of mitral valve regurgitation. No evidence of mitral valve stenosis. Tricuspid Valve: The tricuspid valve is grossly normal. Tricuspid valve regurgitation is not demonstrated. Aortic Valve: The aortic valve is tricuspid. Aortic valve regurgitation is mild. Aortic regurgitation PHT measures 734 msec. No aortic stenosis is present. There is mild calcification of the aortic  valve. Pulmonic Valve: The pulmonic valve was grossly normal. Pulmonic valve regurgitation is not visualized. 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 Ascending aortic diameter up to 47 mm. Better characterized on recent CTA and would defer to those measurements. Aortic dilatation noted. There is an aneurysm involving the aortic root and ascending aorta. Venous: The inferior vena cava is normal in size with greater than 50% respiratory variability, suggesting right atrial pressure of 3 mmHg. IAS/Shunts: No atrial level shunt detected by color flow Doppler.  LEFT VENTRICLE PLAX 2D LVIDd:         5.17 cm LVIDs:         3.69 cm LV PW:         1.17 cm LV IVS:        1.31 cm LVOT diam:     2.50 cm LV SV:         88.85 ml LV SV Index:   28.76 LVOT Area:     4.91 cm  RIGHT VENTRICLE            IVC RV Basal diam:  3.17 cm    IVC diam: 1.53 cm RV Mid diam:    2.36 cm RV S prime:     9.03 cm/s TAPSE (M-mode): 1.9 cm LEFT ATRIUM              Index       RIGHT ATRIUM           Index LA diam:        5.20 cm  2.12 cm/m  RA Area:     19.30 cm LA Vol (A2C):   103.0 ml 41.91 ml/m RA Volume:   55.80 ml  22.70 ml/m LA Vol (A4C):   42.9 ml  17.46 ml/m LA Biplane Vol: 67.2 ml  27.34 ml/m  AORTIC VALVE LVOT Vmax:  101.00 cm/s LVOT Vmean:  62.900 cm/s LVOT VTI:    0.181 m AI PHT:      734 msec  AORTA Ao Root diam: 4.50 cm Ao Asc diam:  4.70 cm MITRAL VALVE MV Area (PHT): 2.42 cm    SHUNTS MV Decel Time: 313 msec    Systemic VTI:  0.18 m MV E velocity: 36.90 cm/s  Systemic Diam: 2.50 cm MV A velocity: 74.70 cm/s MV E/A ratio:  0.49 Eleonore Chiquito MD Electronically signed by Eleonore Chiquito MD Signature Date/Time: 09/23/2019/3:00:38 PM    Final    CT Angio Chest/Abd/Pel for Dissection W and/or Wo Contrast  Result Date: 09/23/2019 CLINICAL DATA:  Follow-up thoracic aortic aneurysm. Chest pain and shortness of breath with marked hypertension. EXAM: CT ANGIOGRAPHY CHEST, ABDOMEN AND PELVIS TECHNIQUE: Multidetector CT imaging through  the chest, abdomen and pelvis was performed using the standard protocol during bolus administration of intravenous contrast. Multiplanar reconstructed images and MIPs were obtained and reviewed to evaluate the vascular anatomy. CONTRAST:  116mL OMNIPAQUE IOHEXOL 350 MG/ML SOLN COMPARISON:  04/23/2017 chest CTA FINDINGS: CTA CHEST FINDINGS Cardiovascular: Noncontrast phase shows no acute intramural hematoma in the thoracic aorta. There is atherosclerotic calcification of the coronaries and aorta. Borderline heart size with possible left ventricular hypertrophy. Enlarged aortic root with aortic diameters below: Valve 28 mm Sinuses of Valsalva 45 mm Sino-tubular junction 42 mm Ascending segment 42 mm Arch 35 mm Descending segment 34 mm. No aortic dissection or inflammatory wall thickening. The great vessels are widely patent. Limited opacification of the pulmonary arteries with no apparent filling defect. Mediastinum/Nodes: Negative for adenopathy or inflammation. Lungs/Pleura: Mild right lower lobe scarring related to endplate osteophytes. There is no edema, consolidation, effusion, or pneumothorax. Musculoskeletal: No acute finding Review of the MIP images confirms the above findings. CTA ABDOMEN AND PELVIS FINDINGS VASCULAR Aorta: Atheromatous plaque most notable in the infrarenal segment where there is 33 mm fusiform dilatation. Negative for dissection or inflammatory wall thickening. Celiac: No significant finding when accounting for motion. SMA: Motion artifact. No branch occlusion, beading, or aneurysm. Mild atherosclerotic plaque seen proximally. Renals: Minimal atheromatous changes. IMA: Patent Inflow: Heavy involvement of atherosclerotic plaque. Ectatic common iliac arteries measuring up to 23 mm in diameter on the left and 20 mm on the right. Atheromatous occlusion of the left hypogastric artery. No acute finding or flow reducing stenosis Veins: Negative in the arterial phase Review of the MIP images  confirms the above findings. NON-VASCULAR Hepatobiliary: Peripheral discontinuous nodular enhancement in the left lobe measuring 32 mm, consistent with hemangioma. A tiny focus of hypervascularity in the right liver could be hemangioma or perfusion anomaly.No evidence of biliary obstruction or stone. Pancreas: Unremarkable. Spleen: Unremarkable. Adrenals/Urinary Tract: Bilateral adrenal adenomas, the largest in the right gland measuring 24 mm. Bilateral renal cysts, measuring up to 7 cm from the lower pole left kidney. No hydronephrosis or stone. Unremarkable bladder, which is collapsed. Stomach/Bowel:  No obstruction. No evidence of bowel inflammation. Lymphatic: No mass or adenopathy. Reproductive:No pathologic findings. Other: No ascites or pneumoperitoneum. Musculoskeletal: No acute abnormalities. Review of the MIP images confirms the above findings. IMPRESSION: 1. No acute finding. 2. 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 Recommend annual imaging followup by CTA or MRA. This recommendation follows 2010 ACCF/AHA/AATS/ACR/ASA/SCA/SCAI/SIR/STS/SVM Guidelines for the Diagnosis and Management of Patients with Thoracic Aortic Disease. Circulation. 2010; [id number]. Aortic aneurysm NOS (ICD10-I71.9) 3. 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 This recommendation  follows ACR consensus guidelines: White Paper of the ACR Incidental Findings Committee II on Vascular Findings. J Am Coll Radiol 2013; [id number] 4. Aortic Atherosclerosis (ICD10-I70.0). Electronically Signed   By: Monte Fantasia M.D.   On: 09/23/2019 05:39    Cardiac Studies   TTE 09/23/19  1. Left ventricular ejection fraction, by estimation, is 60 to 65%. The  left ventricle has normal function. The left ventricle has no regional  wall motion abnormalities. There is mild asymmetric left ventricular  hypertrophy of the basal-septal segment.  Left ventricular diastolic  parameters are consistent with Grade I  diastolic dysfunction (impaired relaxation).  2. Right ventricular systolic function is normal. The right ventricular  size is normal. Tricuspid regurgitation signal is inadequate for assessing  PA pressure.  3. The mitral valve is grossly normal. No evidence of mitral valve  regurgitation. No evidence of mitral stenosis.  4. The aortic valve is tricuspid. Aortic valve regurgitation is mild. No  aortic stenosis is present.  5. 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 Ascending aortic diameter up to 47  mm. Better characterized on recent CTA and would defer to those  measurements. Aneurysm of the aortic root and ascending aorta.  6. The inferior vena cava is normal in size with greater than 50%  respiratory variability, suggesting right atrial pressure of 3 mmHg.   Patient Profile     59 y.o. male with HTN and diabetes who has not been compliant with medications presented with chest pain and shortness of breath in the setting of hypertensive urgency.   Assessment & Plan    Chest pain -trops and EKG negative for acute ischemia -echo revealed preserved EF of 60-65%; no evidence of regional wall motion abnormalities  -obtaining coronary CTA today for further ischemic evaluation -continue aspirin and statin therapy  Hypertensive urgency -blood pressure improving with initiation of PO regimen -continue amlodipine, lisinopril. Started on low-dose HCTZ.      For questions or updates, please contact [place] Please consult [email or web address] for contact info under       Signed, Delice Bison, DO  09/24/2019, 7:48 AM    I have examined the patient and reviewed assessment and plan and discussed with patient.  Agree with above as stated.    Blood pressure is still high.  He did not want to try ACE inhibitor.  Will initiate ARB.  CT a of coronaries pending.  Evaluate for any severe obstructive CAD.  Thoracic aortic aneurysm.  Stressed the  importance of blood pressure control to avoid any problems in the future.  It is not to the point where it requires intervention at this time.  Probably will need long-term follow-up with a CT surgeon.  He is a very tall man.  He reports his height at 6 foot 9 inches tall.  We will have to see if that would affect the size at which intervention would be required on his aorta.  Larae Grooms

## 2019-09-24 NOTE — Progress Notes (Addendum)
Pt HR during the night 29-63, but mostly in upper 30's to  Mid 40's. Pt denied CP, SOB, dizziness, and light headness. Pt in 2nd HB Mobitz type II. PA Eulas Post came to assess and evaluat pt during the night.

## 2019-09-24 NOTE — Evaluation (Signed)
Physical Therapy Evaluation Patient Details Name: Javier Rivera MRN: [id number] DOB: 1961/07/28 Today's Date: 09/24/2019   History of Present Illness  59 year old male with history of hypertension, CVA with residual right-sided weakness, diabetes mellitus type 2, AAA, renal insufficiency, polysubstance abuse came with shortness of breath and chest pain. Pt presented to ED with symptoms of SOB, chest pain, and found to be in hypertensive urgency. CTA of chest found aneurysmal aortic root and abdominal aortic aneurysm.    Clinical Impression  Javier Rivera is 59 y.o. male admitted with above HPI and diagnosis. Patient is currently limited by functional impairments below (see PT problem list). Patient lives alone and was unclear on providing PLOF and home environment as he was not willing to answer subjective questions stating, "I don't really feel like talking much now ma'am". He was agreeable to ambulate short distance in room to sit in chair, however at EOS asked "why am I in this chair". After explanation for benefits of activity OOB pt agreeable to sit up. BP remains hypertensive and HR ~66 bpm during session with no significant change for mobilizing. Patient will benefit from continued skilled PT interventions to address impairments and progress independence with mobility, recommending HHPT for follow up and tall RW (pt is 6'9"). Acute PT will follow and progress as able.     Follow Up Recommendations Home health PT    Equipment Recommendations  Rolling walker with 5" wheels(tall walker ()    Recommendations for Other Services       Precautions / Restrictions Precautions Precautions: Fall Restrictions Weight Bearing Restrictions: No      Mobility  Bed Mobility Overal bed mobility: Needs Assistance Bed Mobility: Supine to Sit     Supine to sit: Modified independent (Device/Increase time);HOB elevated     General bed mobility comments: pt somewhat impulsive with sitting up EOB,  but follows cues for safety to wait to stand  Transfers Overall transfer level: Needs assistance Equipment used: None Transfers: Sit to/from Stand Sit to Stand: Min guard         General transfer comment: min guard for safety, pt using Bil UE's to initate power up from EOB, slightly unsteadywith rising but no overt LOB  Ambulation/Gait Ambulation/Gait assistance: Min guard Gait Distance (Feet): 10 Feet Assistive device: None Gait Pattern/deviations: Step-through pattern;Decreased stride length;Wide base of support Gait velocity: slow   General Gait Details: pt unsteady with gait reaching out for wall in room and bed rails for support, cues provided for use of IV pole to steady but pt declined use. min guard for safety throughout. pt declined to ambulate in hallway  Stairs            Wheelchair Mobility    Modified Rankin (Stroke Patients Only)       Balance Overall balance assessment: Needs assistance Sitting-balance support: Feet supported Sitting balance-Leahy Scale: Good     Standing balance support: During functional activity Standing balance-Leahy Scale: Fair Standing balance comment: pt reachign for support "furniture/wall surfing during gait"        Pertinent Vitals/Pain Pain Assessment: 0-10 Pain Score: 8  Pain Location: headache Pain Descriptors / Indicators: Aching Pain Intervention(s): Monitored during session    Home Living Family/patient expects to be discharged to:: Private residence Living Arrangements: Alone   Type of Home: Apartment       Home Layout: One level   Additional Comments: pt unwilling to answer questions throughout session stating "I don't really feel like talking much now ma'am"  Prior Function Level of Independence: Independent with assistive device(s)         Comments: pt reports he uses RW or SPC occasionally stating "it depends" for how he is moving on any given day. when asked if he own either pt reports no  to both.     Hand Dominance   Dominant Hand: Right    Extremity/Trunk Assessment   Upper Extremity Assessment Upper Extremity Assessment: Overall WFL for tasks assessed    Lower Extremity Assessment Lower Extremity Assessment: Generalized weakness    Cervical / Trunk Assessment Cervical / Trunk Assessment: Normal  Communication   Communication: (pt not participatory in subjhective history or PLOF)  Cognition Arousal/Alertness: Awake/alert Behavior During Therapy: Flat affect(pt non-participatory thorughout session) Overall Cognitive Status: No family/caregiver present to determine baseline cognitive functioning      General Comments: pt agreeable to mobilize with therapy, not willing to answer therapists questions.      General Comments General comments (skin integrity, edema, etc.): BP in supine in bed 175/100 with HR of 63 bpm. After walking in room and sitting up to chair BP 195/113 with HR of 66 bpm.    Exercises     Assessment/Plan    PT Assessment Patient needs continued PT services  PT Problem List Decreased balance;Decreased mobility;Decreased activity tolerance;Decreased safety awareness;Decreased knowledge of use of DME;Decreased strength       PT Treatment Interventions Functional mobility training;DME instruction;Balance training;Patient/family education;Therapeutic activities;Gait training;Therapeutic exercise;Stair training    PT Goals (Current goals can be found in the Care Plan section)  Acute Rehab PT Goals Patient Stated Goal: none stated by patient PT Goal Formulation: With patient Time For Goal Achievement: 10/08/19 Potential to Achieve Goals: Good    Frequency Min 3X/week   Barriers to discharge   unclear home environment and uncertain assistance available for discharge    AM-PAC PT "6 Clicks" Mobility  Outcome Measure Help needed turning from your back to your side while in a flat bed without using bedrails?: None Help needed moving  from lying on your back to sitting on the side of a flat bed without using bedrails?: None Help needed moving to and from a bed to a chair (including a wheelchair)?: A Little Help needed standing up from a chair using your arms (e.g., wheelchair or bedside chair)?: A Little Help needed to walk in hospital room?: A Little Help needed climbing 3-5 steps with a railing? : A Little 6 Click Score: 20    End of Session Equipment Utilized During Treatment: Gait belt Activity Tolerance: Patient tolerated treatment well Patient left: in chair;with call bell/phone within reach Nurse Communication: Mobility status PT Visit Diagnosis: Muscle weakness (generalized) (M62.81);Difficulty in walking, not elsewhere classified (R26.2);Unsteadiness on feet (R26.81)    Time: 1001-1018 PT Time Calculation (min) (ACUTE ONLY): 17 min   Charges:   PT Evaluation $PT Eval Low Complexity: 1 Low          Verner Mould, DPT Physical Therapist with [facility] [phone]  09/24/2019 11:34 AM

## 2019-09-24 NOTE — Care Management (Signed)
[id number] 09-24-19 Case Manager spoke with patient regarding consult for medications and primary care provider. Patient states he is currently living in a motel at this time. Patient states he has Medicaid and his medications should be no more than $3.50 at the max. [place] pharmacy that the [place] [facility]. Patient should have a physician on his Medicaid card that he is connected with; however he does not have his card to reflect. Case Manager will schedule an appointment at the [facility] and if the patient needs to cancel or reschedule- he can do so. Patient declines Home Health at this time and wants to think about the rolling walker. No further needs from Case Manager at this time. Bethena Roys, RN,BSN Case Manager

## 2019-09-25 DIAGNOSIS — I10 Essential (primary) hypertension: Secondary | ICD-10-CM

## 2019-09-25 DIAGNOSIS — E118 Type 2 diabetes mellitus with unspecified complications: Secondary | ICD-10-CM

## 2019-09-25 LAB — BASIC METABOLIC PANEL
Anion gap: 11 (ref 5–15)
BUN: 12 mg/dL (ref 6–20)
CO2: 24 mmol/L (ref 22–32)
Calcium: 8.8 mg/dL — ABNORMAL LOW (ref 8.9–10.3)
Chloride: 101 mmol/L (ref 98–111)
Creatinine, Ser: 1.36 mg/dL — ABNORMAL HIGH (ref 0.61–1.24)
GFR calc Af Amer: >60 mL/min (ref >60)
GFR calc non Af Amer: 57 mL/min — ABNORMAL LOW (ref >60)
Glucose, Bld: 118 mg/dL — ABNORMAL HIGH (ref 70–99)
Potassium: 3.3 mmol/L — ABNORMAL LOW (ref 3.5–5.1)
Sodium: 136 mmol/L (ref 135–145)

## 2019-09-25 LAB — URINE CULTURE: Culture: >=100000 — AB

## 2019-09-25 LAB — GLUCOSE, CAPILLARY
Glucose-Capillary: 121 mg/dL — ABNORMAL HIGH (ref 70–99)
Glucose-Capillary: 103 mg/dL — ABNORMAL HIGH (ref 70–99)
Glucose-Capillary: 113 mg/dL — ABNORMAL HIGH (ref 70–99)

## 2019-09-25 MED ORDER — LOSARTAN POTASSIUM 50 MG PO TABS: 50.0000 mg | ORAL_TABLET | Freq: Every day | ORAL | Status: DC

## 2019-09-25 MED ORDER — LOSARTAN POTASSIUM 50 MG PO TABS: 50.0000 mg | ORAL_TABLET | Freq: Every day | ORAL | 0 refills | Status: AC

## 2019-09-25 MED ORDER — CEPHALEXIN 500 MG PO CAPS: 500.0000 mg | ORAL_CAPSULE | Freq: Two times a day (BID) | ORAL | 0 refills | Status: DC

## 2019-09-25 MED ORDER — BISACODYL 10 MG RE SUPP: 10.0000 mg | Freq: Once | RECTAL | Status: DC

## 2019-09-25 MED ORDER — AMLODIPINE BESYLATE 10 MG PO TABS: 10.0000 mg | ORAL_TABLET | Freq: Every day | ORAL | 0 refills | Status: AC

## 2019-09-25 MED ORDER — DOCUSATE SODIUM 100 MG PO CAPS
100.0000 mg | ORAL_CAPSULE | Freq: Two times a day (BID) | ORAL | Status: DC
  Administered 2019-09-25: 100 mg via ORAL
  Filled 2019-09-25: qty 1

## 2019-09-25 MED ORDER — CEPHALEXIN 250 MG PO CAPS
500.0000 mg | ORAL_CAPSULE | Freq: Two times a day (BID) | ORAL | Status: DC
  Filled 2019-09-25: qty 2

## 2019-09-25 MED ORDER — HYDROCHLOROTHIAZIDE 12.5 MG PO CAPS: 12.5000 mg | ORAL_CAPSULE | Freq: Every day | ORAL | 0 refills | Status: DC

## 2019-09-25 MED ORDER — ATORVASTATIN CALCIUM 40 MG PO TABS: 40.0000 mg | ORAL_TABLET | Freq: Every day | ORAL | 0 refills | Status: AC

## 2019-09-25 MED FILL — LOSARTAN POTASSIUM 50 MG TA: 50 | 30 days supply | Qty: 30 | Fill #0

## 2019-09-25 MED FILL — AMLODIPINE BESYLATE 10 MG T: 10 | 30 days supply | Qty: 30 | Fill #0

## 2019-09-25 MED FILL — ATORVASTATIN CALCIUM 40 MG: 40 | 30 days supply | Qty: 30 | Fill #0

## 2019-09-25 MED FILL — CEPHALEXIN 500 MG CAPS: 500 | 5 days supply | Qty: 10 | Fill #0

## 2019-09-25 MED FILL — HYDROCHLOROTHIAZIDE 12.5 MG: 12.5 | 30 days supply | Qty: 30 | Fill #0

## 2019-09-25 NOTE — Care Management (Signed)
1426 09-25-19 Patient is now agreeable to the rolling walker- referral sent to [facility] and will be delivered to the room. Medications will be delivered to the room via the transitions of care pharmacy. No further needs from Case Manager at this time. Graves-Bigelow, Ocie Cornfield, RN, BSN Case Manager

## 2019-09-25 NOTE — Progress Notes (Signed)
Reviewed AVS with patient at 1530.  Patient removed three peripheral IVs; sites assessed clean and dry with no bleeding. Patient stated tired of waiting. IV catheters assessed (in trash) and intact after removal.

## 2019-09-25 NOTE — Progress Notes (Signed)
1342 informed by TOC patient to receive meds via delivery from [facility].  Offered to ambulate patient at 1355 and informed to be discharged afterwards, patient declined.   Spoke with Eliseo Squires [phone] and informed that patient declined ambulation  1425 pt. Declined ambulation stated bottom of feet swollen. Declined RN to remove sock to assess, stated has toenails that need to be clipped, declined RN to assess.   Confirmed TOC pharm meds received at 1524  Patient will start taking Keflex tonight at 2200 to have consistent and realistic q 12 hour schedule. Paged Vann at 1539. Held 1500 Keflex.since q 12 hours, patient will take tonight to maintain realistic q 12 hours (10Am & 10 PM). Upon callback ok with starting at 2200 and holding 1500 dose.   1729 patient d/c with RN student via wheelchair to meet sister at entrance. Patient discharged with walker and TOC pharm meds.

## 2019-09-25 NOTE — Discharge Summary (Signed)
Physician Discharge Summary  Javier Rivera [id number] DOB: [birth year] DOA: 09/23/2019  PCP: Patient, No Pcp Per should have PCP in the back of his Medicaid card but if not has been set up with the community health and wellness  Admit date: 09/23/2019 Discharge date: 09/25/2019  Admitted From: Home Discharge disposition: Home   Recommendations for Outpatient Follow-Up:   Declined home health Needs outpatient referral to [facility] BMP 1 week  Discharge Diagnosis:   Principal Problem:   Chest pain Active Problems:   Hyperlipidemia   Disorder resulting from impaired renal function   Diabetes mellitus type 2, controlled (HCC)   Sensory disturbance   Ascending aorta dilatation (HCC)   Slow transit constipation   Hypertensive urgency   Leukopenia    Discharge Condition: Improved.  Diet recommendation: Low sodium, heart healthy  Wound care: None.  Code status: Full.   History of Present Illness:   Javier Rivera is a 59 y.o. male with medical history significant of hypertension, CVA with residual right-sided weakness and 2017, DM type II, AAA, renal insufficiency, and polysubstance abuse.  Patient presents with complaints of shortness of breath and chest pain.  History is a little difficult to ascertain from the patient.  He states that he has been feeling bad for years.  Last night patient reports feeling as though he could not catch his breath and had chest pain on both sides.  He describes it as a pressure-like feeling.  Notes associated symptoms of sweats, being congested, intermittent dry cough, nausea, lower right quadrant abdominal pain, decreased urine output, and constipated.  He complains of food feeling as though it is just sitting on his stomach and not moving and has been having numbness in both feet for several years now.  He has not seen a doctor or been on medication since 2019 and states it is because he has not been able to get around.  Patient reports that  he used to have a walker, but reports that it broke.  Denies any use of tobacco, alcohol, or illicit drugs in at least 4 to 5 years.  He reports that he has previously been evaluated for his thoracic abdominal aortic aneurysm and they had discussed surgery at one point in time.    In route with EMS patient blood pressure was documented at 200/40 and he received full dose aspirin and 0.8 mg of nitroglycerin.    Hospital Course by Problem:   Chest pain -trops and EKG negative for acute ischemia -echo revealed preserved EF of 60-65%; no evidence of regional wall motion abnormalities  -coronary CTA yesterday showed mild, non-obstructive CAD -chest pain most likely related to hypertensive urgency  Hypertensive urgency -blood pressure continues to improve on escalated PO regimen. Lisinopril changed to losartan given his reported intolerance to it in the past -continue amlodipine 10 mg, Losartan 50 mg, HCTZ 12.5 mg -will need close follow-up outpatient to continue titrating regimen as needed   Thoracic aortic aneurysm -has been counseled on importance of blood pressure control and avoidance of heavy lifting  -does not require intervention at this time -will need periodic surveillance and follow-up with CT surgeon - PCP to make referral     Medical Consultants:   Cardiology   Discharge Exam:   Vitals:   09/25/19 0818 09/25/19 1048  BP: (!) 150/97 (!) 139/97  Pulse: 70 68  Resp: 18 20  Temp: 98.5 F (36.9 C) 98.1 F (36.7 C)  SpO2: 96% 97%   Vitals:  09/25/19 0034 09/25/19 0528 09/25/19 0818 09/25/19 1048  BP: (!) 131/98 (!) 162/114 (!) 150/97 (!) 139/97  Pulse: 64 70 70 68  Resp: 14 18 18 20   Temp: 98.7 F (37.1 C) 98.4 F (36.9 C) 98.5 F (36.9 C) 98.1 F (36.7 C)  TempSrc: Oral Oral Oral Oral  SpO2: 97% 96% 96% 97%  Weight:  116.5 kg    Height:        General exam: Appears calm and comfortable. Flat affect  The results of significant diagnostics from this  hospitalization (including imaging, microbiology, ancillary and laboratory) are listed below for reference.     Procedures and Diagnostic Studies:   CT Head Wo Contrast  Result Date: 09/23/2019 CLINICAL DATA:  Elevated blood pressure. EXAM: CT HEAD WITHOUT CONTRAST TECHNIQUE: Contiguous axial images were obtained from the base of the skull through the vertex without intravenous contrast. COMPARISON:  Head CT 10/27/2016, brain MRI 10/28/2016 FINDINGS: Brain: Brain volume is normal for age. No intracranial hemorrhage, mass effect, or midline shift. No hydrocephalus. The basilar cisterns are patent. Moderate chronic small vessel ischemia, age advanced. No evidence of territorial infarct or acute ischemia. No extra-axial or intracranial fluid collection. Vascular: No hyperdense vessel or unexpected calcification. Skull: No fracture or focal lesion. Sinuses/Orbits: Paranasal sinuses and mastoid air cells are clear. The visualized orbits are unremarkable. Other: None. IMPRESSION: 1. No acute intracranial abnormality. 2. Chronic small vessel ischemia, similar to prior exam. Electronically Signed   By: Keith Rake M.D.   On: 09/23/2019 05:29   CT CORONARY MORPH W/CTA COR W/SCORE W/CA W/CM &/OR WO/CM  Addendum Date: 09/24/2019   ADDENDUM REPORT: 09/24/2019 13:19 CLINICAL DATA:  Chest pain EXAM: Cardiac/Coronary CTA TECHNIQUE: The patient was scanned on a [employer]. A 100 kV prospective scan was triggered in the descending thoracic aorta at 111 HU's. Axial non-contrast 3 mm slices were carried out through the heart. The data set was analyzed on a dedicated work station and scored using the Bartlett. Gantry rotation speed was 250 msecs and collimation was .6 mm. No beta blockade and 0.8 mg of sl NTG was given. The 3D data set was reconstructed in 5% intervals of the 35-75 % of the R-R cycle. Diastolic phases were analyzed on a dedicated work station using MPR, MIP and VRT modes. The patient  received 80 cc of contrast. FINDINGS: Image quality: excellent. Noise artifact is: Limited. Coronary Arteries:  Normal coronary origin.  Right dominance. Left main: The left main is a large caliber vessel with a normal take off from the left coronary cusp that bifurcates to form a left anterior descending artery and a left circumflex artery. There is no plaque or stenosis. Left anterior descending artery: The proximal LAD contains minimal calcified plaque (<25%). The mid LAD contains minimal mixed density plaque (<25%). The distal LAD is patent. The first diagonal branch is patent. The second diagonal contains mild calcified plaque (25-49%). Left circumflex artery: The LCX is non-dominant. The proximal LCX is patent. The mid LCX contains minimal calcified plaque (<25%). OM1 contains minimal non-calcified plaque (<25%). OM2 contains mild mixed density plaque (25-49%). OM3 contains mild mixed density plaque (25-49%). The distal LCX is small and hypoplastic. Right coronary artery: The RCA is dominant. The proximal RCA contains minimal mixed density plaque (<25%). The mid RCA contains mild mixed density plaque (25-49%). There is significant motion artifact of the distal RCA, yet the segments remains interpretable. The distal RCA is ectatic without significant plaque. The RCA terminates  as a PDA without evidence of plaque or stenosis. Right Atrium: Right atrial size is within normal limits. Right Ventricle: The right ventricular cavity is within normal limits. Left Atrium: Left atrial size is normal in size with no left atrial appendage filling defect. Left Ventricle: The ventricular cavity size is within normal limits. There are no stigmata of prior infarction. There is no abnormal filling defect. Pulmonary arteries: Normal in size without proximal filling defect. Pulmonary veins: Normal pulmonary venous drainage. Pericardium: Normal thickness with no significant effusion or calcium present. Cardiac valves: The aortic  valve is trileaflet without significant calcification. The mitral valve is normal structure without significant calcification. Aorta: Aneurysm of the ascending aorta. Mild calcification. The following measurements made using double oblique technique. Aortic root: 45 mm STJ: 47 mm Ascending aorta: 45 mm Extra-cardiac findings: See attached radiology report for non-cardiac structures. IMPRESSION: 1. Coronary calcium score of 423. This was 99th percentile for age and sex matched control. 2. Normal coronary origin with right dominance. 3. 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 4. Distal RCA ectasia. 5. 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 RECOMMENDATIONS: 1. Mild non-obstructive CAD (25-49%). Consider non-atherosclerotic causes of chest pain. Consider preventive therapy and risk factor modification. Eleonore Chiquito, MD Electronically Signed   By: Eleonore Chiquito   On: 09/24/2019 13:19   Result Date: 09/24/2019 EXAM: OVER-READ INTERPRETATION  CT CHEST The following report is an over-read performed by radiologist Dr. Suzy Bouchard of [facility], [place] on 09/24/2019. This over-read does not include interpretation of cardiac or coronary anatomy or pathology. The coronary calcium score/coronary CTA interpretation by the cardiologist is attached. COMPARISON:  CT chest abdomen pelvis 09/23/2019 FINDINGS: Limited view of the lung parenchyma demonstrates no suspicious nodularity. Airways are normal. Limited view of the mediastinum demonstrates no adenopathy. Esophagus normal. Limited view of the upper abdomen demonstrates an enhancing lesion in the lateral LEFT hepatic lobe measuring 3.2 cm. This is described as benign hemangioma on recent CT scan. Limited view of the skeleton and chest wall is unremarkable. IMPRESSION: 1. Benign hemangioma LEFT hepatic lobe. 2. No additional significant extracardiac findings. Electronically Signed: By: Suzy Bouchard M.D. On:  09/24/2019 12:18   DG Chest Portable 1 View  Result Date: 09/23/2019 CLINICAL DATA:  Chest pain and shortness of breath. EXAM: PORTABLE CHEST 1 VIEW COMPARISON:  Radiograph 04/23/2017 FINDINGS: The cardiomediastinal contours are normal. Borderline cardiomegaly. Pulmonary vasculature is normal. No consolidation, pleural effusion, or pneumothorax. No acute osseous abnormalities are seen. IMPRESSION: Borderline cardiomegaly without acute abnormality. Electronically Signed   By: Keith Rake M.D.   On: 09/23/2019 04:19   ECHOCARDIOGRAM COMPLETE  Result Date: 09/23/2019    ECHOCARDIOGRAM REPORT   Patient Name:   Javier Rivera Date of Exam: 09/23/2019 Medical Rec #:  [id number]      Height:       81.0 in Accession #:    [id number]     Weight:       229.1 lb Date of Birth:  06/22/1961       BSA:          2.46 m Patient Age:    27 years       BP:           167/117 mmHg Patient Gender: M              HR:           68 bpm. Exam Location:  Inpatient Procedure: 2D Echo, Cardiac Doppler and Color Doppler Indications:    R07.9* Chest pain, unspecified  History:        Patient has prior history of Echocardiogram examinations, most                 recent 10/19/2015. Stroke; Risk Factors:Hypertension and                 Diabetes. Renal Disease.  Sonographer:    Tiffany Dance Referring Phys: [id number] RONDELL A SMITH IMPRESSIONS  1. Left ventricular ejection fraction, by estimation, is 60 to 65%. The left ventricle has normal function. The left ventricle has no regional wall motion abnormalities. There is mild asymmetric left ventricular hypertrophy of the basal-septal segment. Left ventricular diastolic parameters are consistent with Grade I diastolic dysfunction (impaired relaxation).  2. Right ventricular systolic function is normal. The right ventricular size is normal. Tricuspid regurgitation signal is inadequate for assessing PA pressure.  3. The mitral valve is grossly normal. No evidence of mitral valve regurgitation.  No evidence of mitral stenosis.  4. The aortic valve is tricuspid. Aortic valve regurgitation is mild. No aortic stenosis is present.  5. 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 Ascending aortic diameter up to 47 mm. Better characterized on recent CTA and would defer to those measurements. Aneurysm of the aortic root and ascending aorta.  6. The inferior vena cava is normal in size with greater than 50% respiratory variability, suggesting right atrial pressure of 3 mmHg. Comparison(s): A prior study was performed on 10/19/2015. No significant change from prior study. FINDINGS  Left Ventricle: Left ventricular ejection fraction, by estimation, is 60 to 65%. The left ventricle has normal function. The left ventricle has no regional wall motion abnormalities. The left ventricular internal cavity size was normal in size. There is  mild asymmetric left ventricular hypertrophy of the basal-septal segment. Left ventricular diastolic parameters are consistent with Grade I diastolic dysfunction (impaired relaxation). Right Ventricle: The right ventricular size is normal. No increase in right ventricular wall thickness. Right ventricular systolic function is normal. Tricuspid regurgitation signal is inadequate for assessing PA pressure. Left Atrium: Left atrial size was normal in size. Right Atrium: Right atrial size was normal in size. Pericardium: Trivial pericardial effusion is present. Presence of pericardial fat pad. Mitral Valve: The mitral valve is grossly normal. No evidence of mitral valve regurgitation. No evidence of mitral valve stenosis. Tricuspid Valve: The tricuspid valve is grossly normal. Tricuspid valve regurgitation is not demonstrated. Aortic Valve: The aortic valve is tricuspid. Aortic valve regurgitation is mild. Aortic regurgitation PHT measures 734 msec. No aortic stenosis is present. There is mild calcification of the aortic valve. Pulmonic Valve: The pulmonic valve was grossly normal. Pulmonic valve  regurgitation is not visualized. 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 Ascending aortic diameter up to 47 mm. Better characterized on recent CTA and would defer to those measurements. Aortic dilatation noted. There is an aneurysm involving the aortic root and ascending aorta. Venous: The inferior vena cava is normal in size with greater than 50% respiratory variability, suggesting right atrial pressure of 3 mmHg. IAS/Shunts: No atrial level shunt detected by color flow Doppler.  LEFT VENTRICLE PLAX 2D LVIDd:         5.17 cm LVIDs:         3.69 cm LV PW:         1.17 cm LV IVS:        1.31 cm LVOT diam:  2.50 cm LV SV:         88.85 ml LV SV Index:   28.76 LVOT Area:     4.91 cm  RIGHT VENTRICLE            IVC RV Basal diam:  3.17 cm    IVC diam: 1.53 cm RV Mid diam:    2.36 cm RV S prime:     9.03 cm/s TAPSE (M-mode): 1.9 cm LEFT ATRIUM              Index       RIGHT ATRIUM           Index LA diam:        5.20 cm  2.12 cm/m  RA Area:     19.30 cm LA Vol (A2C):   103.0 ml 41.91 ml/m RA Volume:   55.80 ml  22.70 ml/m LA Vol (A4C):   42.9 ml  17.46 ml/m LA Biplane Vol: 67.2 ml  27.34 ml/m  AORTIC VALVE LVOT Vmax:   101.00 cm/s LVOT Vmean:  62.900 cm/s LVOT VTI:    0.181 m AI PHT:      734 msec  AORTA Ao Root diam: 4.50 cm Ao Asc diam:  4.70 cm MITRAL VALVE MV Area (PHT): 2.42 cm    SHUNTS MV Decel Time: 313 msec    Systemic VTI:  0.18 m MV E velocity: 36.90 cm/s  Systemic Diam: 2.50 cm MV A velocity: 74.70 cm/s MV E/A ratio:  0.49 Eleonore Chiquito MD Electronically signed by Eleonore Chiquito MD Signature Date/Time: 09/23/2019/3:00:38 PM    Final    CT Angio Chest/Abd/Pel for Dissection W and/or Wo Contrast  Result Date: 09/23/2019 CLINICAL DATA:  Follow-up thoracic aortic aneurysm. Chest pain and shortness of breath with marked hypertension. EXAM: CT ANGIOGRAPHY CHEST, ABDOMEN AND PELVIS TECHNIQUE: Multidetector CT imaging through the chest, abdomen and pelvis was performed using the standard protocol during  bolus administration of intravenous contrast. Multiplanar reconstructed images and MIPs were obtained and reviewed to evaluate the vascular anatomy. CONTRAST:  150mL OMNIPAQUE IOHEXOL 350 MG/ML SOLN COMPARISON:  04/23/2017 chest CTA FINDINGS: CTA CHEST FINDINGS Cardiovascular: Noncontrast phase shows no acute intramural hematoma in the thoracic aorta. There is atherosclerotic calcification of the coronaries and aorta. Borderline heart size with possible left ventricular hypertrophy. Enlarged aortic root with aortic diameters below: Valve 28 mm Sinuses of Valsalva 45 mm Sino-tubular junction 42 mm Ascending segment 42 mm Arch 35 mm Descending segment 34 mm. No aortic dissection or inflammatory wall thickening. The great vessels are widely patent. Limited opacification of the pulmonary arteries with no apparent filling defect. Mediastinum/Nodes: Negative for adenopathy or inflammation. Lungs/Pleura: Mild right lower lobe scarring related to endplate osteophytes. There is no edema, consolidation, effusion, or pneumothorax. Musculoskeletal: No acute finding Review of the MIP images confirms the above findings. CTA ABDOMEN AND PELVIS FINDINGS VASCULAR Aorta: Atheromatous plaque most notable in the infrarenal segment where there is 33 mm fusiform dilatation. Negative for dissection or inflammatory wall thickening. Celiac: No significant finding when accounting for motion. SMA: Motion artifact. No branch occlusion, beading, or aneurysm. Mild atherosclerotic plaque seen proximally. Renals: Minimal atheromatous changes. IMA: Patent Inflow: Heavy involvement of atherosclerotic plaque. Ectatic common iliac arteries measuring up to 23 mm in diameter on the left and 20 mm on the right. Atheromatous occlusion of the left hypogastric artery. No acute finding or flow reducing stenosis Veins: Negative in the arterial phase Review of the MIP images confirms the above findings.  NON-VASCULAR Hepatobiliary: Peripheral discontinuous  nodular enhancement in the left lobe measuring 32 mm, consistent with hemangioma. A tiny focus of hypervascularity in the right liver could be hemangioma or perfusion anomaly.No evidence of biliary obstruction or stone. Pancreas: Unremarkable. Spleen: Unremarkable. Adrenals/Urinary Tract: Bilateral adrenal adenomas, the largest in the right gland measuring 24 mm. Bilateral renal cysts, measuring up to 7 cm from the lower pole left kidney. No hydronephrosis or stone. Unremarkable bladder, which is collapsed. Stomach/Bowel:  No obstruction. No evidence of bowel inflammation. Lymphatic: No mass or adenopathy. Reproductive:No pathologic findings. Other: No ascites or pneumoperitoneum. Musculoskeletal: No acute abnormalities. Review of the MIP images confirms the above findings. IMPRESSION: 1. No acute finding. 2. 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 Recommend annual imaging followup by CTA or MRA. This recommendation follows 2010 ACCF/AHA/AATS/ACR/ASA/SCA/SCAI/SIR/STS/SVM Guidelines for the Diagnosis and Management of Patients with Thoracic Aortic Disease. Circulation. 2010; [id number]. Aortic aneurysm NOS (ICD10-I71.9) 3. 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 This recommendation follows ACR consensus guidelines: White Paper of the ACR Incidental Findings Committee II on Vascular Findings. J Am Coll Radiol 2013; [id number] 4. Aortic Atherosclerosis (ICD10-I70.0). Electronically Signed   By: Monte Fantasia M.D.   On: 09/23/2019 05:39     Labs:   Basic Metabolic Panel: Recent Labs  Lab 09/23/19 0406 09/23/19 0406 09/23/19 0426 09/23/19 0426 09/24/19 0627 09/25/19 1049  NA 137  --  138  --  135 136  K 3.9   < > 4.0   < > 3.8 3.3*  CL 104  --  104  --  103 101  CO2 22  --   --   --  22 24  GLUCOSE 150*  --  143*  --  125* 118*  BUN 12  --  15  --  12 12  CREATININE 1.27*  --  1.30*  --  1.17 1.36*  CALCIUM 8.5*  --   --    --  8.5* 8.8*   < > = values in this interval not displayed.   GFR Estimated Creatinine Clearance: 82.3 mL/min (A) (by C-G formula based on SCr of 1.36 mg/dL (H)). Liver Function Tests: Recent Labs  Lab 09/23/19 0406  AST 24  ALT 23  ALKPHOS 52  BILITOT 0.8  PROT 7.1  ALBUMIN 3.7   No results for input(s): LIPASE, AMYLASE in the last 168 hours. No results for input(s): AMMONIA in the last 168 hours. Coagulation profile No results for input(s): INR, PROTIME in the last 168 hours.  CBC: Recent Labs  Lab 09/23/19 0406 09/23/19 0426 09/24/19 0627  WBC 3.6*  --  3.2*  NEUTROABS 1.5*  --  1.6*  HGB 17.4* 18.0* 16.2  HCT 53.6* 53.0* 50.6  MCV 88.4  --  88.5  PLT 173  --  151   Cardiac Enzymes: No results for input(s): CKTOTAL, CKMB, CKMBINDEX, TROPONINI in the last 168 hours. BNP: Invalid input(s): POCBNP CBG: Recent Labs  Lab 09/24/19 1119 09/24/19 1649 09/24/19 2117 09/25/19 0611 09/25/19 1151  GLUCAP 145* 89 135* 121* 103*   D-Dimer No results for input(s): DDIMER in the last 72 hours. Hgb A1c Recent Labs    09/23/19 0735  HGBA1C 6.9*   Lipid Profile Recent Labs    09/23/19 0735  CHOL 213*  HDL 36*  LDLCALC 162*  TRIG 76  CHOLHDL 5.9   Thyroid function studies Recent Labs    09/23/19 1239  TSH 2.287  Anemia work up No results for input(s): VITAMINB12, FOLATE, FERRITIN, TIBC, IRON, RETICCTPCT in the last 72 hours. Microbiology Recent Results (from the past 240 hour(s))  Respiratory Panel by RT PCR (Flu A&B, Covid) - Nasopharyngeal Swab     Status: None   Collection Time: 09/23/19  4:13 AM   Specimen: Nasopharyngeal Swab  Result Value Ref Range Status   SARS Coronavirus 2 by RT PCR NEGATIVE NEGATIVE Final    Comment: (NOTE) SARS-CoV-2 target nucleic acids are NOT DETECTED. The SARS-CoV-2 RNA is generally detectable in upper respiratoy specimens during the acute phase of infection. The lowest concentration of SARS-CoV-2 viral copies this  assay can detect is 131 copies/mL. A negative result does not preclude SARS-Cov-2 infection and should not be used as the sole basis for treatment or other patient management decisions. A negative result may occur with  improper specimen collection/handling, submission of specimen other than nasopharyngeal swab, presence of viral mutation(s) within the areas targeted by this assay, and inadequate number of viral copies (<131 copies/mL). A negative result must be combined with clinical observations, patient history, and epidemiological information. The expected result is Negative. Fact Sheet for Patients:  PinkCheek.be Fact Sheet for Healthcare Providers:  GravelBags.it This test is not yet ap proved or cleared by the [place] FDA and  has been authorized for detection and/or diagnosis of SARS-CoV-2 by FDA under an Emergency Use Authorization (EUA). This EUA will remain  in effect (meaning this test can be used) for the duration of the COVID-19 declaration under Section 564(b)(1) of the Act, 21 U.S.C. section 360bbb-3(b)(1), unless the authorization is terminated or revoked sooner.    Influenza A by PCR NEGATIVE NEGATIVE Final   Influenza B by PCR NEGATIVE NEGATIVE Final    Comment: (NOTE) The Xpert Xpress SARS-CoV-2/FLU/RSV assay is intended as an aid in  the diagnosis of influenza from Nasopharyngeal swab specimens and  should not be used as a sole basis for treatment. Nasal washings and  aspirates are unacceptable for Xpert Xpress SARS-CoV-2/FLU/RSV  testing. Fact Sheet for Patients: PinkCheek.be Fact Sheet for Healthcare Providers: GravelBags.it This test is not yet approved or cleared by the [place] FDA and  has been authorized for detection and/or diagnosis of SARS-CoV-2 by  FDA under an Emergency Use Authorization (EUA). This EUA will remain  in  effect (meaning this test can be used) for the duration of the  Covid-19 declaration under Section 564(b)(1) of the Act, 21  U.S.C. section 360bbb-3(b)(1), unless the authorization is  terminated or revoked. Performed at [facility], [place] [address], [place], [place] [address]   Urine culture     Status: Abnormal   Collection Time: 09/23/19  5:41 AM   Specimen: Urine, Random  Result Value Ref Range Status   Specimen Description URINE, RANDOM  Final   Special Requests   Final    NONE Performed at [facility], [place] [address] [place], [place] [address]    Culture >=100,000 COLONIES/mL ESCHERICHIA COLI (A)  Final   Report Status 09/25/2019 FINAL  Final   Organism ID, Bacteria ESCHERICHIA COLI (A)  Final      Susceptibility   Escherichia coli - MIC*    AMPICILLIN >=32 RESISTANT Resistant     CEFAZOLIN 16 SENSITIVE Sensitive     CEFTRIAXONE 1 SENSITIVE Sensitive     CIPROFLOXACIN <=0.25 SENSITIVE Sensitive     GENTAMICIN <=1 SENSITIVE Sensitive     IMIPENEM <=0.25 SENSITIVE Sensitive  NITROFURANTOIN <=16 SENSITIVE Sensitive     TRIMETH/SULFA <=20 SENSITIVE Sensitive     AMPICILLIN/SULBACTAM >=32 RESISTANT Resistant     PIP/TAZO 8 SENSITIVE Sensitive     * >=100,000 COLONIES/mL ESCHERICHIA COLI     Discharge Instructions:   Discharge Instructions    Diet - low sodium heart healthy   Complete by: As directed    Diet Carb Modified   Complete by: As directed    Discharge instructions   Complete by: As directed    You will need your PCP to refer you to CT surgery to follow your Thoracic aortic aneurysm Needs strict blood pressure control as well   Increase activity slowly   Complete by: As directed      Allergies as of 09/25/2019      Reactions   Clonidine Derivatives Other (See Comments)   Pt reported the sweats and shakes   Lisinopril Palpitations, Other (See Comments)   "made me real sick, made my heart rate scatter"      Medication List    STOP  taking these medications   glipiZIDE 10 MG tablet Commonly known as: GLUCOTROL     TAKE these medications   amLODipine 10 MG tablet Commonly known as: NORVASC Take 1 tablet (10 mg total) by mouth daily.   aspirin EC 81 MG tablet Take 1 tablet (81 mg total) by mouth daily.   atorvastatin 40 MG tablet Commonly known as: LIPITOR Take 1 tablet (40 mg total) by mouth daily at 6 PM.   cephALEXin 500 MG capsule Commonly known as: KEFLEX Take 1 capsule (500 mg total) by mouth every 12 (twelve) hours.   gabapentin 300 MG capsule Commonly known as: NEURONTIN Take 1 capsule (300 mg total) by mouth 3 (three) times daily.   hydrochlorothiazide 12.5 MG capsule Commonly known as: MICROZIDE Take 1 capsule (12.5 mg total) by mouth daily. Start taking on: September 26, 2019   losartan 50 MG tablet Commonly known as: COZAAR Take 1 tablet (50 mg total) by mouth daily. Start taking on: September 26, 2019      Follow-up [place].   Why: GO: March 1st at 2:30 pm Contact information: [address] [place] [phone]           Time coordinating discharge: 25 min  Signed:  Geradine Girt DO  [facility] 09/25/2019, 1:43 PM

## 2019-09-25 NOTE — Progress Notes (Addendum)
Progress Note  Patient Name: Javier Rivera Date of Encounter: 09/25/2019  Primary Cardiologist: No primary care provider on file.   Subjective   No chest pain or shortness of breath. Complains of abdominal pain related to not having a BM in 5 days.   Inpatient Medications    Scheduled Meds:  amLODipine  10 mg Oral Daily   aspirin EC  81 mg Oral Daily   atorvastatin  40 mg Oral q1800   enoxaparin (LOVENOX) injection  40 mg Subcutaneous Q24H   gabapentin  300 mg Oral TID   hydrochlorothiazide  12.5 mg Oral Daily   insulin aspart  0-9 Units Subcutaneous TID WC   losartan  25 mg Oral Daily   senna-docusate  1 tablet Oral BID   Continuous Infusions:  sodium chloride 10 mL/hr at 09/24/19 1617   cefTRIAXone (ROCEPHIN)  IV Stopped (09/24/19 0900)   nitroGLYCERIN Stopped (09/24/19 1049)   PRN Meds: acetaminophen, ondansetron (ZOFRAN) IV   Vital Signs    Vitals:   09/24/19 1122 09/24/19 2021 09/25/19 0034 09/25/19 0528  BP: (!) 165/101 (!) 164/102 (!) 131/98 (!) 162/114  Pulse: 60 74 64 70  Resp: 20 18 14 18   Temp: 97.7 F (36.5 C) 98.5 F (36.9 C) 98.7 F (37.1 C) 98.4 F (36.9 C)  TempSrc: Oral Oral Oral Oral  SpO2: 94% 98% 97% 96%  Weight:    116.5 kg  Height:        Intake/Output Summary (Last 24 hours) at 09/25/2019 0752 Last data filed at 09/25/2019 0734 Gross per 24 hour  Intake 1397.86 ml  Output 2850 ml  Net -1452.14 ml   Last 3 Weights 09/25/2019 09/24/2019 09/23/2019  Weight (lbs) 256 lb 14.4 oz 258 lb 2.5 oz 229 lb 0.9 oz  Weight (kg) 116.529 kg 117.1 kg 103.9 kg      Telemetry    NSR - Personally Reviewed  ECG    No new tracings - Personally Reviewed  Physical Exam   GEN: No acute distress.   Neck: No JVD Cardiac: RRR, no murmurs, rubs, or gallops.  Respiratory: Clear to auscultation bilaterally. GI: Soft, nontender, non-distended  MS: No edema; No deformity. Neuro:  Nonfocal  Psych: Normal affect   Labs    High  Sensitivity Troponin:   Recent Labs  Lab 09/23/19 0406 09/23/19 0735  TROPONINIHS 6 5      Chemistry Recent Labs  Lab 09/23/19 0406 09/23/19 0426 09/24/19 0627  NA 137 138 135  K 3.9 4.0 3.8  CL 104 104 103  CO2 22  --  22  GLUCOSE 150* 143* 125*  BUN 12 15 12   CREATININE 1.27* 1.30* 1.17  CALCIUM 8.5*  --  8.5*  PROT 7.1  --   --   ALBUMIN 3.7  --   --   AST 24  --   --   ALT 23  --   --   ALKPHOS 52  --   --   BILITOT 0.8  --   --   GFRNONAA >60  --  >60  GFRAA >60  --  >60  ANIONGAP 11  --  10     Hematology Recent Labs  Lab 09/23/19 0406 09/23/19 0426 09/24/19 0627  WBC 3.6*  --  3.2*  RBC 6.06*  --  5.72  HGB 17.4* 18.0* 16.2  HCT 53.6* 53.0* 50.6  MCV 88.4  --  88.5  MCH 28.7  --  28.3  MCHC 32.5  --  32.0  RDW 13.2  --  13.4  PLT 173  --  151    BNPNo results for input(s): BNP, PROBNP in the last 168 hours.   DDimer No results for input(s): DDIMER in the last 168 hours.   Radiology    CT CORONARY MORPH W/CTA COR W/SCORE W/CA W/CM &/OR WO/CM  Addendum Date: 09/24/2019   ADDENDUM REPORT: 09/24/2019 13:19 CLINICAL DATA:  Chest pain EXAM: Cardiac/Coronary CTA TECHNIQUE: The patient was scanned on a [employer]. A 100 kV prospective scan was triggered in the descending thoracic aorta at 111 HU's. Axial non-contrast 3 mm slices were carried out through the heart. The data set was analyzed on a dedicated work station and scored using the [place]. Gantry rotation speed was 250 msecs and collimation was .6 mm. No beta blockade and 0.8 mg of sl NTG was given. The 3D data set was reconstructed in 5% intervals of the 35-75 % of the R-R cycle. Diastolic phases were analyzed on a dedicated work station using MPR, MIP and VRT modes. The patient received 80 cc of contrast. FINDINGS: Image quality: excellent. Noise artifact is: Limited. Coronary Arteries:  Normal coronary origin.  Right dominance. Left main: The left main is a large caliber vessel  with a normal take off from the left coronary cusp that bifurcates to form a left anterior descending artery and a left circumflex artery. There is no plaque or stenosis. Left anterior descending artery: The proximal LAD contains minimal calcified plaque (<25%). The mid LAD contains minimal mixed density plaque (<25%). The distal LAD is patent. The first diagonal branch is patent. The second diagonal contains mild calcified plaque (25-49%). Left circumflex artery: The LCX is non-dominant. The proximal LCX is patent. The mid LCX contains minimal calcified plaque (<25%). OM1 contains minimal non-calcified plaque (<25%). OM2 contains mild mixed density plaque (25-49%). OM3 contains mild mixed density plaque (25-49%). The distal LCX is small and hypoplastic. Right coronary artery: The RCA is dominant. The proximal RCA contains minimal mixed density plaque (<25%). The mid RCA contains mild mixed density plaque (25-49%). There is significant motion artifact of the distal RCA, yet the segments remains interpretable. The distal RCA is ectatic without significant plaque. The RCA terminates as a PDA without evidence of plaque or stenosis. Right Atrium: Right atrial size is within normal limits. Right Ventricle: The right ventricular cavity is within normal limits. Left Atrium: Left atrial size is normal in size with no left atrial appendage filling defect. Left Ventricle: The ventricular cavity size is within normal limits. There are no stigmata of prior infarction. There is no abnormal filling defect. Pulmonary arteries: Normal in size without proximal filling defect. Pulmonary veins: Normal pulmonary venous drainage. Pericardium: Normal thickness with no significant effusion or calcium present. Cardiac valves: The aortic valve is trileaflet without significant calcification. The mitral valve is normal structure without significant calcification. Aorta: Aneurysm of the ascending aorta. Mild calcification. The following  measurements made using double oblique technique. Aortic root: 45 mm STJ: 47 mm Ascending aorta: 45 mm Extra-cardiac findings: See attached radiology report for non-cardiac structures. IMPRESSION: 1. Coronary calcium score of 423. This was 99th percentile for age and sex matched control. 2. Normal coronary origin with right dominance. 3. 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 4. Distal RCA ectasia. 5. 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 RECOMMENDATIONS: 1. Mild non-obstructive CAD (25-49%). Consider non-atherosclerotic causes of chest pain. Consider preventive therapy  and risk factor modification. Eleonore Chiquito, MD Electronically Signed   By: Eleonore Chiquito   On: 09/24/2019 13:19   Result Date: 09/24/2019 EXAM: OVER-READ INTERPRETATION  CT CHEST The following report is an over-read performed by radiologist Dr. Suzy Bouchard of [facility], [place] on 09/24/2019. This over-read does not include interpretation of cardiac or coronary anatomy or pathology. The coronary calcium score/coronary CTA interpretation by the cardiologist is attached. COMPARISON:  CT chest abdomen pelvis 09/23/2019 FINDINGS: Limited view of the lung parenchyma demonstrates no suspicious nodularity. Airways are normal. Limited view of the mediastinum demonstrates no adenopathy. Esophagus normal. Limited view of the upper abdomen demonstrates an enhancing lesion in the lateral LEFT hepatic lobe measuring 3.2 cm. This is described as benign hemangioma on recent CT scan. Limited view of the skeleton and chest wall is unremarkable. IMPRESSION: 1. Benign hemangioma LEFT hepatic lobe. 2. No additional significant extracardiac findings. Electronically Signed: By: Suzy Bouchard M.D. On: 09/24/2019 12:18   ECHOCARDIOGRAM COMPLETE  Result Date: 09/23/2019    ECHOCARDIOGRAM REPORT   Patient Name:   EULOGIO RAIKES Date of Exam: 09/23/2019 Medical Rec #:  [id number]      Height:        81.0 in Accession #:    [id number]     Weight:       229.1 lb Date of Birth:  1961-07-13       BSA:          2.46 m Patient Age:    59 years       BP:           167/117 mmHg Patient Gender: M              HR:           68 bpm. Exam Location:  Inpatient Procedure: 2D Echo, Cardiac Doppler and Color Doppler Indications:    R07.9* Chest pain, unspecified  History:        Patient has prior history of Echocardiogram examinations, most                 recent 10/19/2015. Stroke; Risk Factors:Hypertension and                 Diabetes. Renal Disease.  Sonographer:    Tiffany Dance Referring Phys: [id number] RONDELL A SMITH IMPRESSIONS  1. Left ventricular ejection fraction, by estimation, is 60 to 65%. The left ventricle has normal function. The left ventricle has no regional wall motion abnormalities. There is mild asymmetric left ventricular hypertrophy of the basal-septal segment. Left ventricular diastolic parameters are consistent with Grade I diastolic dysfunction (impaired relaxation).  2. Right ventricular systolic function is normal. The right ventricular size is normal. Tricuspid regurgitation signal is inadequate for assessing PA pressure.  3. The mitral valve is grossly normal. No evidence of mitral valve regurgitation. No evidence of mitral stenosis.  4. The aortic valve is tricuspid. Aortic valve regurgitation is mild. No aortic stenosis is present.  5. 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 Ascending aortic diameter up to 47 mm. Better characterized on recent CTA and would defer to those measurements. Aneurysm of the aortic root and ascending aorta.  6. The inferior vena cava is normal in size with greater than 50% respiratory variability, suggesting right atrial pressure of 3 mmHg. Comparison(s): A prior study was performed on 10/19/2015. No significant change from prior study. FINDINGS  Left Ventricle: Left ventricular ejection fraction, by estimation, is 60 to 65%. The left ventricle has  normal function. The left  ventricle has no regional wall motion abnormalities. The left ventricular internal cavity size was normal in size. There is  mild asymmetric left ventricular hypertrophy of the basal-septal segment. Left ventricular diastolic parameters are consistent with Grade I diastolic dysfunction (impaired relaxation). Right Ventricle: The right ventricular size is normal. No increase in right ventricular wall thickness. Right ventricular systolic function is normal. Tricuspid regurgitation signal is inadequate for assessing PA pressure. Left Atrium: Left atrial size was normal in size. Right Atrium: Right atrial size was normal in size. Pericardium: Trivial pericardial effusion is present. Presence of pericardial fat pad. Mitral Valve: The mitral valve is grossly normal. No evidence of mitral valve regurgitation. No evidence of mitral valve stenosis. Tricuspid Valve: The tricuspid valve is grossly normal. Tricuspid valve regurgitation is not demonstrated. Aortic Valve: The aortic valve is tricuspid. Aortic valve regurgitation is mild. Aortic regurgitation PHT measures 734 msec. No aortic stenosis is present. There is mild calcification of the aortic valve. Pulmonic Valve: The pulmonic valve was grossly normal. Pulmonic valve regurgitation is not visualized. 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 Ascending aortic diameter up to 47 mm. Better characterized on recent CTA and would defer to those measurements. Aortic dilatation noted. There is an aneurysm involving the aortic root and ascending aorta. Venous: The inferior vena cava is normal in size with greater than 50% respiratory variability, suggesting right atrial pressure of 3 mmHg. IAS/Shunts: No atrial level shunt detected by color flow Doppler.  LEFT VENTRICLE PLAX 2D LVIDd:         5.17 cm LVIDs:         3.69 cm LV PW:         1.17 cm LV IVS:        1.31 cm LVOT diam:     2.50 cm LV SV:         88.85 ml LV SV Index:   28.76 LVOT Area:     4.91 cm  RIGHT VENTRICLE             IVC RV Basal diam:  3.17 cm    IVC diam: 1.53 cm RV Mid diam:    2.36 cm RV S prime:     9.03 cm/s TAPSE (M-mode): 1.9 cm LEFT ATRIUM              Index       RIGHT ATRIUM           Index LA diam:        5.20 cm  2.12 cm/m  RA Area:     19.30 cm LA Vol (A2C):   103.0 ml 41.91 ml/m RA Volume:   55.80 ml  22.70 ml/m LA Vol (A4C):   42.9 ml  17.46 ml/m LA Biplane Vol: 67.2 ml  27.34 ml/m  AORTIC VALVE LVOT Vmax:   101.00 cm/s LVOT Vmean:  62.900 cm/s LVOT VTI:    0.181 m AI PHT:      734 msec  AORTA Ao Root diam: 4.50 cm Ao Asc diam:  4.70 cm MITRAL VALVE MV Area (PHT): 2.42 cm    SHUNTS MV Decel Time: 313 msec    Systemic VTI:  0.18 m MV E velocity: 36.90 cm/s  Systemic Diam: 2.50 cm MV A velocity: 74.70 cm/s MV E/A ratio:  0.49 Eleonore Chiquito MD Electronically signed by Eleonore Chiquito MD Signature Date/Time: 09/23/2019/3:00:38 PM    Final     Cardiac Studies   Coronary CTA  IMPRESSION:  1. Coronary calcium score of 423. This was 99th percentile for age and sex matched control.  2. Normal coronary origin with right dominance.  3. 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  4. Distal RCA ectasia.  5. 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  Patient Profile     59 y.o. male with HTN and diabetes who has not been compliant with medications presented with chest pain and shortness of breath in the setting of hypertensive urgency.   Assessment & Plan    Chest pain -trops and EKG negative for acute ischemia -echo revealed preserved EF of 60-65%; no evidence of regional wall motion abnormalities  -coronary CTA yesterday showed mild, non-obstructive CAD -chest pain most likely related to hypertensive urgency -continue preventative therapy with aspirin and statin, as well as anti-hypertensive regimen as below  Hypertensive urgency -blood pressure continues to improve on escalated PO regimen. Lisinopril changed to losartan given  his reported intolerance to it in the past -continue amlodipine 10 mg, Losartan 25 mg, HCTZ 12.5 mg -will need close follow-up outpatient to continue titrating regimen as needed   Thoracic aortic aneurysm -has been counseled on importance of blood pressure control and avoidance of heavy lifting  -does not require intervention at this time -will need periodic surveillance and follow-up with CT surgeon       For questions or updates, please contact [place] Please consult [email or web address] for contact info under      Signed, Delice Bison, DO  09/25/2019, 7:52 AM    I have examined the patient and reviewed assessment and plan and discussed with patient.  Agree with above as stated.    Increase losartan.  BP control important.  Nonobstructive CAD. COntinue statin along with aggressive secondary prevention.   I stressed the importance of compliance with meds and finding a new PMD so that he has a steady supply of BP meds.   Larae Grooms

## 2019-10-07 ENCOUNTER — Ambulatory Visit: Payer: Medicaid Other | Admitting: Internal Medicine

## 2019-10-18 ENCOUNTER — Other Ambulatory Visit: Payer: Self-pay

## 2019-10-18 ENCOUNTER — Inpatient Hospital Stay (HOSPITAL_COMMUNITY)
Admission: EM | Admit: 2019-10-18 | Discharge: 2019-10-24 | DRG: 305 | Disposition: A | Discharge status: Skilled Nursing Facility | Payer: Medicaid Other | Attending: Internal Medicine | Admitting: Internal Medicine

## 2019-10-18 ENCOUNTER — Emergency Department (HOSPITAL_COMMUNITY): Payer: Medicaid Other

## 2019-10-18 ENCOUNTER — Encounter (HOSPITAL_COMMUNITY): Payer: Self-pay | Admitting: Emergency Medicine

## 2019-10-18 DIAGNOSIS — Z8249 Family history of ischemic heart disease and other diseases of the circulatory system: Secondary | ICD-10-CM | POA: Diagnosis not present

## 2019-10-18 DIAGNOSIS — Z888 Allergy status to other drugs, medicaments and biological substances status: Secondary | ICD-10-CM

## 2019-10-18 DIAGNOSIS — I639 Cerebral infarction, unspecified: Secondary | ICD-10-CM

## 2019-10-18 DIAGNOSIS — I129 Hypertensive chronic kidney disease with stage 1 through stage 4 chronic kidney disease, or unspecified chronic kidney disease: Secondary | ICD-10-CM | POA: Diagnosis present

## 2019-10-18 DIAGNOSIS — I77810 Thoracic aortic ectasia: Secondary | ICD-10-CM | POA: Diagnosis not present

## 2019-10-18 DIAGNOSIS — Z79899 Other long term (current) drug therapy: Secondary | ICD-10-CM

## 2019-10-18 DIAGNOSIS — Q2543 Congenital aneurysm of aorta: Secondary | ICD-10-CM | POA: Diagnosis not present

## 2019-10-18 DIAGNOSIS — I674 Hypertensive encephalopathy: Secondary | ICD-10-CM | POA: Diagnosis present

## 2019-10-18 DIAGNOSIS — E119 Type 2 diabetes mellitus without complications: Secondary | ICD-10-CM

## 2019-10-18 DIAGNOSIS — E785 Hyperlipidemia, unspecified: Secondary | ICD-10-CM | POA: Diagnosis present

## 2019-10-18 DIAGNOSIS — I714 Abdominal aortic aneurysm, without rupture: Secondary | ICD-10-CM | POA: Diagnosis present

## 2019-10-18 DIAGNOSIS — Z833 Family history of diabetes mellitus: Secondary | ICD-10-CM

## 2019-10-18 DIAGNOSIS — R531 Weakness: Secondary | ICD-10-CM | POA: Diagnosis present

## 2019-10-18 DIAGNOSIS — F1910 Other psychoactive substance abuse, uncomplicated: Secondary | ICD-10-CM | POA: Diagnosis present

## 2019-10-18 DIAGNOSIS — K5900 Constipation, unspecified: Secondary | ICD-10-CM | POA: Diagnosis not present

## 2019-10-18 DIAGNOSIS — Z20822 Contact with and (suspected) exposure to covid-19: Secondary | ICD-10-CM | POA: Diagnosis present

## 2019-10-18 DIAGNOSIS — I444 Left anterior fascicular block: Secondary | ICD-10-CM | POA: Diagnosis present

## 2019-10-18 DIAGNOSIS — I161 Hypertensive emergency: Secondary | ICD-10-CM | POA: Diagnosis present

## 2019-10-18 DIAGNOSIS — R29810 Facial weakness: Secondary | ICD-10-CM | POA: Diagnosis present

## 2019-10-18 DIAGNOSIS — N1832 Chronic kidney disease, stage 3b: Secondary | ICD-10-CM | POA: Diagnosis not present

## 2019-10-18 DIAGNOSIS — I10 Essential (primary) hypertension: Secondary | ICD-10-CM | POA: Diagnosis not present

## 2019-10-18 DIAGNOSIS — N182 Chronic kidney disease, stage 2 (mild): Secondary | ICD-10-CM | POA: Diagnosis present

## 2019-10-18 DIAGNOSIS — I441 Atrioventricular block, second degree: Secondary | ICD-10-CM | POA: Diagnosis present

## 2019-10-18 DIAGNOSIS — Z8673 Personal history of transient ischemic attack (TIA), and cerebral infarction without residual deficits: Secondary | ICD-10-CM

## 2019-10-18 DIAGNOSIS — N2889 Other specified disorders of kidney and ureter: Secondary | ICD-10-CM | POA: Diagnosis present

## 2019-10-18 DIAGNOSIS — Z7982 Long term (current) use of aspirin: Secondary | ICD-10-CM | POA: Diagnosis not present

## 2019-10-18 DIAGNOSIS — Z87891 Personal history of nicotine dependence: Secondary | ICD-10-CM | POA: Diagnosis not present

## 2019-10-18 DIAGNOSIS — E1159 Type 2 diabetes mellitus with other circulatory complications: Secondary | ICD-10-CM | POA: Diagnosis not present

## 2019-10-18 DIAGNOSIS — I69351 Hemiplegia and hemiparesis following cerebral infarction affecting right dominant side: Secondary | ICD-10-CM

## 2019-10-18 DIAGNOSIS — I998 Other disorder of circulatory system: Secondary | ICD-10-CM | POA: Diagnosis present

## 2019-10-18 DIAGNOSIS — I491 Atrial premature depolarization: Secondary | ICD-10-CM | POA: Diagnosis present

## 2019-10-18 DIAGNOSIS — E1122 Type 2 diabetes mellitus with diabetic chronic kidney disease: Secondary | ICD-10-CM | POA: Diagnosis present

## 2019-10-18 DIAGNOSIS — D751 Secondary polycythemia: Secondary | ICD-10-CM | POA: Diagnosis present

## 2019-10-18 DIAGNOSIS — Z885 Allergy status to narcotic agent status: Secondary | ICD-10-CM

## 2019-10-18 DIAGNOSIS — R001 Bradycardia, unspecified: Secondary | ICD-10-CM | POA: Diagnosis not present

## 2019-10-18 LAB — I-STAT CHEM 8, ED
BUN: 12 mg/dL (ref 6–20)
Calcium, Ion: 1.08 mmol/L — ABNORMAL LOW (ref 1.15–1.40)
Chloride: 105 mmol/L (ref 98–111)
Creatinine, Ser: 1.2 mg/dL (ref 0.61–1.24)
Glucose, Bld: 104 mg/dL — ABNORMAL HIGH (ref 70–99)
HCT: 52 % (ref 39.0–52.0)
Hemoglobin: 17.7 g/dL — ABNORMAL HIGH (ref 13.0–17.0)
Potassium: 3.6 mmol/L (ref 3.5–5.1)
Sodium: 140 mmol/L (ref 135–145)
TCO2: 25 mmol/L (ref 22–32)

## 2019-10-18 LAB — URINALYSIS, ROUTINE W REFLEX MICROSCOPIC
Bacteria, UA: NONE SEEN
Bilirubin Urine: NEGATIVE
Glucose, UA: NEGATIVE mg/dL
Hgb urine dipstick: NEGATIVE
Ketones, ur: 5 mg/dL — AB
Leukocytes,Ua: NEGATIVE
Nitrite: NEGATIVE
Protein, ur: 30 mg/dL — AB
Specific Gravity, Urine: >1.046 — ABNORMAL HIGH (ref 1.005–1.030)
pH: 5 (ref 5.0–8.0)

## 2019-10-18 LAB — DIFFERENTIAL
Abs Immature Granulocytes: 0.01 10*3/uL (ref 0.00–0.07)
Basophils Absolute: 0 10*3/uL (ref 0.0–0.1)
Basophils Relative: 0 %
Eosinophils Absolute: 0 10*3/uL (ref 0.0–0.5)
Eosinophils Relative: 1 %
Immature Granulocytes: 0 %
Lymphocytes Relative: 37 %
Lymphs Abs: 1.4 10*3/uL (ref 0.7–4.0)
Monocytes Absolute: 0.3 10*3/uL (ref 0.1–1.0)
Monocytes Relative: 8 %
Neutro Abs: 2.1 10*3/uL (ref 1.7–7.7)
Neutrophils Relative %: 54 %

## 2019-10-18 LAB — RAPID URINE DRUG SCREEN, HOSP PERFORMED
Amphetamines: NOT DETECTED
Barbiturates: NOT DETECTED
Benzodiazepines: NOT DETECTED
Cocaine: NOT DETECTED
Opiates: NOT DETECTED
Tetrahydrocannabinol: NOT DETECTED

## 2019-10-18 LAB — CBC
HCT: 52.9 % — ABNORMAL HIGH (ref 39.0–52.0)
Hemoglobin: 17.3 g/dL — ABNORMAL HIGH (ref 13.0–17.0)
MCH: 29.1 pg (ref 26.0–34.0)
MCHC: 32.7 g/dL (ref 30.0–36.0)
MCV: 88.9 fL (ref 80.0–100.0)
Platelets: 176 10*3/uL (ref 150–400)
RBC: 5.95 MIL/uL — ABNORMAL HIGH (ref 4.22–5.81)
RDW: 13.5 % (ref 11.5–15.5)
WBC: 3.8 10*3/uL — ABNORMAL LOW (ref 4.0–10.5)
nRBC: 0 % (ref 0.0–0.2)

## 2019-10-18 LAB — COMPREHENSIVE METABOLIC PANEL
ALT: 32 U/L (ref 0–44)
AST: 22 U/L (ref 15–41)
Albumin: 3.9 g/dL (ref 3.5–5.0)
Alkaline Phosphatase: 61 U/L (ref 38–126)
Anion gap: 10 (ref 5–15)
BUN: 10 mg/dL (ref 6–20)
CO2: 21 mmol/L — ABNORMAL LOW (ref 22–32)
Calcium: 8.6 mg/dL — ABNORMAL LOW (ref 8.9–10.3)
Chloride: 108 mmol/L (ref 98–111)
Creatinine, Ser: 1.28 mg/dL — ABNORMAL HIGH (ref 0.61–1.24)
GFR calc Af Amer: >60 mL/min (ref >60)
GFR calc non Af Amer: >60 mL/min (ref >60)
Glucose, Bld: 108 mg/dL — ABNORMAL HIGH (ref 70–99)
Potassium: 3.8 mmol/L (ref 3.5–5.1)
Sodium: 139 mmol/L (ref 135–145)
Total Bilirubin: 1.2 mg/dL (ref 0.3–1.2)
Total Protein: 7.3 g/dL (ref 6.5–8.1)

## 2019-10-18 LAB — CBG MONITORING, ED: Glucose-Capillary: 90 mg/dL (ref 70–99)

## 2019-10-18 LAB — ETHANOL: Alcohol, Ethyl (B): <10 mg/dL (ref <10)

## 2019-10-18 LAB — PROTIME-INR
INR: 1 (ref 0.8–1.2)
Prothrombin Time: 12.6 seconds (ref 11.4–15.2)

## 2019-10-18 LAB — APTT: aPTT: 32 seconds (ref 24–36)

## 2019-10-18 LAB — SARS CORONAVIRUS 2 (TAT 6-24 HRS): SARS Coronavirus 2: NEGATIVE

## 2019-10-18 MED ORDER — ASPIRIN 300 MG RE SUPP: 300.0000 mg | Freq: Once | RECTAL | Status: DC

## 2019-10-18 MED ORDER — STROKE: EARLY STAGES OF RECOVERY BOOK
Freq: Once | Status: AC
  Filled 2019-10-18: qty 1

## 2019-10-18 MED ORDER — AMLODIPINE BESYLATE 5 MG PO TABS
10.0000 mg | ORAL_TABLET | Freq: Every day | ORAL | Status: DC
  Administered 2019-10-18: 10 mg via ORAL
  Filled 2019-10-18: qty 2

## 2019-10-18 MED ORDER — ASPIRIN EC 81 MG PO TBEC: 81.0000 mg | DELAYED_RELEASE_TABLET | Freq: Every day | ORAL | Status: DC

## 2019-10-18 MED ORDER — ACETAMINOPHEN 650 MG RE SUPP: 650.0000 mg | RECTAL | Status: DC | PRN

## 2019-10-18 MED ORDER — CARVEDILOL 3.125 MG PO TABS
3.1250 mg | ORAL_TABLET | Freq: Two times a day (BID) | ORAL | Status: DC
  Administered 2019-10-18 – 2019-10-19 (×2): 3.125 mg via ORAL
  Filled 2019-10-18 (×4): qty 1

## 2019-10-18 MED ORDER — IOHEXOL 350 MG/ML SOLN
100.0000 mL | Freq: Once | INTRAVENOUS | Status: AC | PRN
  Administered 2019-10-18: 100 mL via INTRAVENOUS

## 2019-10-18 MED ORDER — ATORVASTATIN CALCIUM 40 MG PO TABS
40.0000 mg | ORAL_TABLET | Freq: Every day | ORAL | Status: DC
  Administered 2019-10-18: 40 mg via ORAL
  Filled 2019-10-18: qty 1

## 2019-10-18 MED ORDER — LOSARTAN POTASSIUM 50 MG PO TABS
50.0000 mg | ORAL_TABLET | Freq: Every day | ORAL | Status: DC
  Administered 2019-10-18: 50 mg via ORAL
  Filled 2019-10-18: qty 1

## 2019-10-18 MED ORDER — ACETAMINOPHEN 160 MG/5ML PO SOLN: 650.0000 mg | ORAL | Status: DC | PRN

## 2019-10-18 MED ORDER — ASPIRIN EC 81 MG PO TBEC
81.0000 mg | DELAYED_RELEASE_TABLET | Freq: Every day | ORAL | Status: DC
  Administered 2019-10-18 – 2019-10-24 (×7): 81 mg via ORAL
  Filled 2019-10-18 (×7): qty 1

## 2019-10-18 MED ORDER — HEPARIN SODIUM (PORCINE) 5000 UNIT/ML IJ SOLN
5000.0000 [IU] | Freq: Three times a day (TID) | INTRAMUSCULAR | Status: DC
  Administered 2019-10-18 – 2019-10-24 (×18): 5000 [IU] via SUBCUTANEOUS
  Filled 2019-10-18 (×18): qty 1

## 2019-10-18 MED ORDER — LABETALOL HCL 5 MG/ML IV SOLN
10.0000 mg | INTRAVENOUS | Status: DC | PRN
  Administered 2019-10-18: 10 mg via INTRAVENOUS
  Filled 2019-10-18: qty 4

## 2019-10-18 MED ORDER — ACETAMINOPHEN 325 MG PO TABS
650.0000 mg | ORAL_TABLET | ORAL | Status: DC | PRN
  Administered 2019-10-23: 650 mg via ORAL
  Filled 2019-10-18: qty 2

## 2019-10-18 MED ORDER — INSULIN ASPART 100 UNIT/ML ~~LOC~~ SOLN
0.0000 [IU] | Freq: Three times a day (TID) | SUBCUTANEOUS | Status: DC
  Administered 2019-10-19 – 2019-10-24 (×5): 1 [IU] via SUBCUTANEOUS

## 2019-10-18 MED ORDER — SENNOSIDES-DOCUSATE SODIUM 8.6-50 MG PO TABS
1.0000 | ORAL_TABLET | Freq: Every evening | ORAL | Status: DC | PRN
  Administered 2019-10-20: 1 via ORAL
  Filled 2019-10-18: qty 1

## 2019-10-18 MED ORDER — HYDROCHLOROTHIAZIDE 12.5 MG PO CAPS
12.5000 mg | ORAL_CAPSULE | Freq: Every day | ORAL | Status: DC
  Administered 2019-10-18 – 2019-10-21 (×4): 12.5 mg via ORAL
  Filled 2019-10-18 (×4): qty 1

## 2019-10-18 MED ORDER — STROKE: EARLY STAGES OF RECOVERY BOOK: Freq: Once | Status: DC

## 2019-10-18 NOTE — Progress Notes (Signed)
Patient has home medication locked up in pharmacy, paper work on chart

## 2019-10-18 NOTE — ED Notes (Signed)
Pt wishes to eat before taking medication. Dr Erlinda Hong states q 4 neuro checks.

## 2019-10-18 NOTE — ED Notes (Signed)
ED Provider at bedside. Dr. Erlinda Hong.

## 2019-10-18 NOTE — ED Notes (Signed)
Per Dr. Erlinda Hong, due to concern for stroke, his BP can be < 220/120 for 24-48 hours. Pt being transported to MRI at this time.

## 2019-10-18 NOTE — ED Provider Notes (Signed)
[facility] EMERGENCY DEPARTMENT Provider Note   CSN: [id number] Arrival date & time: 10/18/19  1152  An emergency department physician performed an initial assessment on this suspected stroke patient at 1154.  History Chief Complaint  Patient presents with  . Code Stroke  . Facial Droop    Jarry Manon is a 59 y.o. male.  HPI Patient presented as a code stroke.  Met in the hallway outside the CT scanner by myself.  Last normal 11:00 last night.  Right-sided weakness.  Has had a previous stroke and has some mild chronic right-sided weakness with it.  Reportedly worse today.  No fevers.  No headache.  Seen by Dr.Xu from neurology also.  LVO score only 1.  Patient not on anticoagulation.-    Past Medical History:  Diagnosis Date  . Diabetes mellitus without complication ([place])   . Hypertension   . Renal disorder    pt stated that masses were found on his kidneys  . Renal insufficiency   . Stroke [facility])    r sided weakness, memory    Patient Active Problem List   Diagnosis Date Noted  . Hypertensive urgency 09/23/2019  . Leukopenia 09/23/2019  . FTT (failure to thrive) in adult 10/29/2016  . Right hip pain 10/29/2016  . Weakness 10/27/2016  . Fall 10/27/2016  . Thoracic aortic aneurysm without rupture ([place])   . Chest pain   . Slow transit constipation 11/25/2015  . Right sided weakness 10/17/2015  . Uncontrolled hypertension 10/17/2015  . Cerebral infarction ([place]) 10/17/2015  . Sensory disturbance 04/25/2015  . Ascending aorta dilatation ([facility])   . Protein-calorie malnutrition, severe ([place]) 04/24/2015  . Heart block AV second degree 04/23/2015  . Dizziness 04/23/2015  . Diabetes mellitus type 2, controlled ([place]) 04/23/2015  . Essential hypertension 04/23/2015  . Tobacco abuse 04/23/2015  . SEBORRHEIC DERMATITIS 05/07/2010  . RECTAL BLEEDING 08/14/2009  . Hyperlipidemia 06/11/2009  . Cocaine abuse ([place]) 06/11/2009  . Disorder resulting from  impaired renal function 06/11/2009    Past Surgical History:  Procedure Laterality Date  . KNEE SURGERY     bilateral       Family History  Problem Relation Age of Onset  . Diabetes Mellitus II Mother   . Hypertension Mother   . Diabetes Mellitus II Sister   . Hypertension Sister     Social History   Tobacco Use  . Smoking status: Former Smoker    Packs/day: 0.25    Years: 20.00    Pack years: 5.00    Types: Cigarettes  . Smokeless tobacco: Never Used  Substance Use Topics  . Alcohol use: No  . Drug use: No    Comment: 04/23/15 - "clean for over a year"    Home Medications Prior to Admission medications   Medication Sig Start Date End Date Taking? Authorizing Provider  amLODipine (NORVASC) 10 MG tablet Take 1 tablet (10 mg total) by mouth daily. 09/25/19  Yes Eulogio Bear U, DO  atorvastatin (LIPITOR) 40 MG tablet Take 1 tablet (40 mg total) by mouth daily at 6 PM. 09/25/19  Yes Vann, Jessica U, DO  hydrochlorothiazide (MICROZIDE) 12.5 MG capsule Take 1 capsule (12.5 mg total) by mouth daily. 09/26/19  Yes Vann, Jessica U, DO  losartan (COZAAR) 50 MG tablet Take 1 tablet (50 mg total) by mouth daily. 09/26/19  Yes Geradine Girt, DO  aspirin EC 81 MG tablet Take 1 tablet (81 mg total) by mouth daily. Patient not taking: Reported on  09/23/2019 12/14/17   Dorena Dew, FNP  cephALEXin (KEFLEX) 500 MG capsule Take 1 capsule (500 mg total) by mouth every 12 (twelve) hours. Patient not taking: Reported on 10/18/2019 09/25/19   Geradine Girt, DO  gabapentin (NEURONTIN) 300 MG capsule Take 1 capsule (300 mg total) by mouth 3 (three) times daily. Patient not taking: Reported on 09/23/2019 12/14/17   Dorena Dew, FNP    Allergies    Clonidine derivatives and Lisinopril  Review of Systems   Review of Systems  Constitutional: Negative for appetite change.  HENT: Negative for congestion.   Respiratory: Negative for shortness of breath.   Cardiovascular: Negative for  chest pain.  Gastrointestinal: Negative for abdominal pain.  Musculoskeletal: Negative for back pain.  Neurological: Positive for weakness. Negative for headaches.  Psychiatric/Behavioral: Negative for confusion.    Physical Exam Updated Vital Signs BP (!) 161/120   Pulse 61   Temp 98.8 F (37.1 C) (Oral)   Resp 15   SpO2 99%   Physical Exam Vitals and nursing note reviewed.  HENT:     Head:     Comments: Right-sided facial droop. Eyes:     Extraocular Movements: Extraocular movements intact.  Cardiovascular:     Rate and Rhythm: Regular rhythm.  Pulmonary:     Breath sounds: No wheezing or rhonchi.  Abdominal:     Tenderness: There is no abdominal tenderness.  Skin:    General: Skin is warm.  Neurological:     Mental Status: He is alert and oriented to person, place, and time.     Comments: Right-sided weakness.  Somewhat inconsistent however.  Right-sided facial droop.  Complete NIH scoring done by neurology.     ED Results / Procedures / Treatments   Labs (all labs ordered are listed, but only abnormal results are displayed) Labs Reviewed  CBC - Abnormal; Notable for the following components:      Result Value   WBC 3.8 (*)    RBC 5.95 (*)    Hemoglobin 17.3 (*)    HCT 52.9 (*)    All other components within normal limits  COMPREHENSIVE METABOLIC PANEL - Abnormal; Notable for the following components:   CO2 21 (*)    Glucose, Bld 108 (*)    Creatinine, Ser 1.28 (*)    Calcium 8.6 (*)    All other components within normal limits  URINALYSIS, ROUTINE W REFLEX MICROSCOPIC - Abnormal; Notable for the following components:   Specific Gravity, Urine >1.046 (*)    Ketones, ur 5 (*)    Protein, ur 30 (*)    All other components within normal limits  I-STAT CHEM 8, ED - Abnormal; Notable for the following components:   Glucose, Bld 104 (*)    Calcium, Ion 1.08 (*)    Hemoglobin 17.7 (*)    All other components within normal limits  SARS CORONAVIRUS 2 (TAT  6-24 HRS)  ETHANOL  PROTIME-INR  APTT  DIFFERENTIAL  RAPID URINE DRUG SCREEN, HOSP PERFORMED  CBG MONITORING, ED    EKG None  Radiology CT Code Stroke CTA Head W/WO contrast  Result Date: 10/18/2019 CLINICAL DATA:  Right-sided weakness.  History of stroke. EXAM: CT ANGIOGRAPHY HEAD AND NECK CT PERFUSION BRAIN TECHNIQUE: Multidetector CT imaging of the head and neck was performed using the standard protocol during bolus administration of intravenous contrast. Multiplanar CT image reconstructions and MIPs were obtained to evaluate the vascular anatomy. Carotid stenosis measurements (when applicable) are obtained utilizing NASCET criteria,  using the distal internal carotid diameter as the denominator. Multiphase CT imaging of the brain was performed following IV bolus contrast injection. Subsequent parametric perfusion maps were calculated using RAPID software. CONTRAST:  147m OMNIPAQUE IOHEXOL 350 MG/ML SOLN COMPARISON:  CT head earlier today. CT head 09/23/2019. MRI 10/28/2016 FINDINGS: CTA NECK FINDINGS Aortic arch: Standard branching. Imaged portion shows no evidence of aneurysm or dissection. No significant stenosis of the major arch vessel origins. Right carotid system: Right carotid artery widely patent without stenosis. Minimal atherosclerotic disease right carotid bifurcation. Left carotid system: Atherosclerotic disease left carotid bulb without significant stenosis. Vertebral arteries: Both vertebral arteries widely patent to the basilar without stenosis. Skeleton: Cervical spondylosis. Multilevel spinal and foraminal stenosis due to spurring. Other neck: Negative for mass or adenopathy. Upper chest: Lung apices clear bilaterally. Review of the MIP images confirms the above findings CTA HEAD FINDINGS Anterior circulation: Cavernous carotid widely patent bilaterally. Mild atherosclerotic disease. Anterior and middle cerebral arteries patent bilaterally without stenosis. Negative for aneurysm.  Posterior circulation: Both vertebral arteries patent to the basilar. PICA patent bilaterally. Basilar tortuous but patent bilaterally without significant stenosis. Superior cerebellar and posterior cerebral arteries patent bilaterally without stenosis. Venous sinuses: Limited venous enhancement due to arterial phase scanning Anatomic variants: None Review of the MIP images confirms the above findings CT Brain Perfusion Findings: ASPECTS: 10 CBF (<30%) Volume: 079mPerfusion (Tmax>6.0s) volume: 0ML Mismatch Volume: 38m85mnfarction Location:None IMPRESSION: 1. CT perfusion negative for acute ischemia or infarct 2. Negative for intracranial large vessel occlusion 3. No significant carotid or vertebral artery stenosis in the neck. 4. These results were called by telephone at the time of interpretation on 10/18/2019 at 12:29 pm to provider Xu,Erlinda Hongho verbally acknowledged these results. Electronically Signed   By: ChaFranchot GalloD.   On: 10/18/2019 12:29   CT Code Stroke CTA Neck W/WO contrast  Result Date: 10/18/2019 CLINICAL DATA:  Right-sided weakness.  History of stroke. EXAM: CT ANGIOGRAPHY HEAD AND NECK CT PERFUSION BRAIN TECHNIQUE: Multidetector CT imaging of the head and neck was performed using the standard protocol during bolus administration of intravenous contrast. Multiplanar CT image reconstructions and MIPs were obtained to evaluate the vascular anatomy. Carotid stenosis measurements (when applicable) are obtained utilizing NASCET criteria, using the distal internal carotid diameter as the denominator. Multiphase CT imaging of the brain was performed following IV bolus contrast injection. Subsequent parametric perfusion maps were calculated using RAPID software. CONTRAST:  1038m41mNIPAQUE IOHEXOL 350 MG/ML SOLN COMPARISON:  CT head earlier today. CT head 09/23/2019. MRI 10/28/2016 FINDINGS: CTA NECK FINDINGS Aortic arch: Standard branching. Imaged portion shows no evidence of aneurysm or dissection. No  significant stenosis of the major arch vessel origins. Right carotid system: Right carotid artery widely patent without stenosis. Minimal atherosclerotic disease right carotid bifurcation. Left carotid system: Atherosclerotic disease left carotid bulb without significant stenosis. Vertebral arteries: Both vertebral arteries widely patent to the basilar without stenosis. Skeleton: Cervical spondylosis. Multilevel spinal and foraminal stenosis due to spurring. Other neck: Negative for mass or adenopathy. Upper chest: Lung apices clear bilaterally. Review of the MIP images confirms the above findings CTA HEAD FINDINGS Anterior circulation: Cavernous carotid widely patent bilaterally. Mild atherosclerotic disease. Anterior and middle cerebral arteries patent bilaterally without stenosis. Negative for aneurysm. Posterior circulation: Both vertebral arteries patent to the basilar. PICA patent bilaterally. Basilar tortuous but patent bilaterally without significant stenosis. Superior cerebellar and posterior cerebral arteries patent bilaterally without stenosis. Venous sinuses: Limited venous enhancement due to arterial phase  scanning Anatomic variants: None Review of the MIP images confirms the above findings CT Brain Perfusion Findings: ASPECTS: 10 CBF (<30%) Volume: 40m Perfusion (Tmax>6.0s) volume: 0ML Mismatch Volume: 046mInfarction Location:None IMPRESSION: 1. CT perfusion negative for acute ischemia or infarct 2. Negative for intracranial large vessel occlusion 3. No significant carotid or vertebral artery stenosis in the neck. 4. These results were called by telephone at the time of interpretation on 10/18/2019 at 12:29 pm to provider XuErlinda Hongwho verbally acknowledged these results. Electronically Signed   By: ChFranchot Gallo.D.   On: 10/18/2019 12:29   CT Code Stroke Cerebral Perfusion with contrast  Result Date: 10/18/2019 CLINICAL DATA:  Right-sided weakness.  History of stroke. EXAM: CT ANGIOGRAPHY HEAD AND  NECK CT PERFUSION BRAIN TECHNIQUE: Multidetector CT imaging of the head and neck was performed using the standard protocol during bolus administration of intravenous contrast. Multiplanar CT image reconstructions and MIPs were obtained to evaluate the vascular anatomy. Carotid stenosis measurements (when applicable) are obtained utilizing NASCET criteria, using the distal internal carotid diameter as the denominator. Multiphase CT imaging of the brain was performed following IV bolus contrast injection. Subsequent parametric perfusion maps were calculated using RAPID software. CONTRAST:  10040mMNIPAQUE IOHEXOL 350 MG/ML SOLN COMPARISON:  CT head earlier today. CT head 09/23/2019. MRI 10/28/2016 FINDINGS: CTA NECK FINDINGS Aortic arch: Standard branching. Imaged portion shows no evidence of aneurysm or dissection. No significant stenosis of the major arch vessel origins. Right carotid system: Right carotid artery widely patent without stenosis. Minimal atherosclerotic disease right carotid bifurcation. Left carotid system: Atherosclerotic disease left carotid bulb without significant stenosis. Vertebral arteries: Both vertebral arteries widely patent to the basilar without stenosis. Skeleton: Cervical spondylosis. Multilevel spinal and foraminal stenosis due to spurring. Other neck: Negative for mass or adenopathy. Upper chest: Lung apices clear bilaterally. Review of the MIP images confirms the above findings CTA HEAD FINDINGS Anterior circulation: Cavernous carotid widely patent bilaterally. Mild atherosclerotic disease. Anterior and middle cerebral arteries patent bilaterally without stenosis. Negative for aneurysm. Posterior circulation: Both vertebral arteries patent to the basilar. PICA patent bilaterally. Basilar tortuous but patent bilaterally without significant stenosis. Superior cerebellar and posterior cerebral arteries patent bilaterally without stenosis. Venous sinuses: Limited venous enhancement due  to arterial phase scanning Anatomic variants: None Review of the MIP images confirms the above findings CT Brain Perfusion Findings: ASPECTS: 10 CBF (<30%) Volume: 0mL56mrfusion (Tmax>6.0s) volume: 0ML Mismatch Volume: 0mL 23marction Location:None IMPRESSION: 1. CT perfusion negative for acute ischemia or infarct 2. Negative for intracranial large vessel occlusion 3. No significant carotid or vertebral artery stenosis in the neck. 4. These results were called by telephone at the time of interpretation on 10/18/2019 at 12:29 pm to provider Xu, wErlinda Hong verbally acknowledged these results. Electronically Signed   By: CharlFranchot Gallo   On: 10/18/2019 12:29   CT HEAD CODE STROKE WO CONTRAST  Result Date: 10/18/2019 CLINICAL DATA:  Code stroke.  Acute neurologic deficit EXAM: CT HEAD WITHOUT CONTRAST TECHNIQUE: Contiguous axial images were obtained from the base of the skull through the vertex without intravenous contrast. COMPARISON:  CT head 09/23/2019 FINDINGS: Brain: Image quality degraded by extensive motion. Other identified previously, there is diffuse chronic microvascular ischemic changes in the white matter. Allowing for motion, no acute cortical infarct, hemorrhage, mass. Ventricle size normal. Vascular: Negative for hyperdense vessel Skull: Negative Sinuses/Orbits: Negative Other: None ASPECTS ([place] [facility] Early CT Score) - Ganglionic level infarction (caudate, lentiform nuclei, internal capsule, insula, M1-M3 cortex):  7 - Supraganglionic infarction (M4-M6 cortex): 3 Total score (0-10 with 10 being normal): 10 IMPRESSION: 1. Image quality degraded by extensive motion. 2. No acute abnormality identified allowing for motion 3. Extensive chronic microvascular ischemic changes noted on the prior study. 4. ASPECTS is 10 Electronically Signed   By: Franchot Gallo M.D.   On: 10/18/2019 12:18    Procedures Procedures (including critical care time)  Medications Ordered in ED Medications  iohexol  (OMNIPAQUE) 350 MG/ML injection 100 mL (100 mLs Intravenous Contrast Given 10/18/19 1215)    ED Course  I have reviewed the triage vital signs and the nursing notes.  Pertinent labs & imaging results that were available during my care of the patient were reviewed by me and considered in my medical decision making (see chart for details).    MDM Rules/Calculators/A&P                      Patient presented as a code stroke.  Not a TPA candidate due to time of onset.  CT and perfusion reassuring.  No large vessel findings on CTA.  Seen by neurology.  Recommends inpatient treatment with MRI and PT OT.  Will discuss with hospitalist.  Recent admission ahead thoracic aortic dilatation without dissection Final Clinical Impression(s) / ED Diagnoses Final diagnoses:  Weakness    Rx / DC Orders ED Discharge Orders    None       Davonna Belling, MD 10/18/19 1449

## 2019-10-18 NOTE — ED Triage Notes (Signed)
Patient presents to the ED by EMS with c/o right side facial droop and weakness around midnight 1230.  Pt in CT 1.

## 2019-10-18 NOTE — H&P (Signed)
History and Physical    Javier Rivera [id number] DOB: 1961-01-15 DOA: 10/18/2019  PCP: Patient, No Pcp Per   Patient coming from: Home  I have personally briefly reviewed patient's old medical records in [place]  Chief Complaint: Right sided weakness  HPI: Javier Rivera is a 59 y.o. male with medical history significant of hypertension, CVA with residual right-sided weakness since 2017, DM type II, AAA, renal insufficiency, and polysubstance abuse presented with worsening of right sided weakness and right facial droop. Pt was at his normal health status and went to sleep around midnight. He woke up this morning about 7 AM and notice his right side weaker than before and also has a new right facial droop. No blurry vision, no numbness or tingling, no headache or hearing changes.  ED Course: CT head negative for Bleeding, CTA no large vessel occlusion. MRI negative for stroke.  Review of Systems: As per HPI otherwise 10 point review of systems negative.    Past Medical History:  Diagnosis Date  . Diabetes mellitus without complication ([place])   . Hypertension   . Renal disorder    pt stated that masses were found on his kidneys  . Renal insufficiency   . Stroke [facility])    r sided weakness, memory    Past Surgical History:  Procedure Laterality Date  . KNEE SURGERY     bilateral     reports that he has quit smoking. His smoking use included cigarettes. He has a 5.00 pack-year smoking history. He has never used smokeless tobacco. He reports that he does not drink alcohol or use drugs.  Allergies  Allergen Reactions  . Clonidine Derivatives Other (See Comments)    Pt reported the sweats and shakes  . Lisinopril Palpitations and Other (See Comments)    "made me real sick, made my heart rate scatter"    Family History  Problem Relation Age of Onset  . Diabetes Mellitus II Mother   . Hypertension Mother   . Diabetes Mellitus II Sister   . Hypertension Sister       Prior to Admission medications   Medication Sig Start Date End Date Taking? Authorizing Provider  amLODipine (NORVASC) 10 MG tablet Take 1 tablet (10 mg total) by mouth daily. 09/25/19  Yes Eulogio Bear U, DO  atorvastatin (LIPITOR) 40 MG tablet Take 1 tablet (40 mg total) by mouth daily at 6 PM. 09/25/19  Yes Vann, Jessica U, DO  hydrochlorothiazide (MICROZIDE) 12.5 MG capsule Take 1 capsule (12.5 mg total) by mouth daily. 09/26/19  Yes Vann, Jessica U, DO  losartan (COZAAR) 50 MG tablet Take 1 tablet (50 mg total) by mouth daily. 09/26/19  Yes Geradine Girt, DO  aspirin EC 81 MG tablet Take 1 tablet (81 mg total) by mouth daily. Patient not taking: Reported on 09/23/2019 12/14/17   Dorena Dew, FNP  cephALEXin (KEFLEX) 500 MG capsule Take 1 capsule (500 mg total) by mouth every 12 (twelve) hours. Patient not taking: Reported on 10/18/2019 09/25/19   Geradine Girt, DO  gabapentin (NEURONTIN) 300 MG capsule Take 1 capsule (300 mg total) by mouth 3 (three) times daily. Patient not taking: Reported on 09/23/2019 12/14/17   Dorena Dew, FNP    Physical Exam: Vitals:   10/18/19 1445 10/18/19 1500 10/18/19 1515 10/18/19 1530  BP: (!) 161/108 (!) 184/116 (!) 164/110 (!) 190/121  Pulse: (!) 58 (!) 59 60 60  Resp: 14 20 14 16   Temp:  TempSrc:      SpO2: 97% 97% 96% 98%    Constitutional: NAD, calm, comfortable Vitals:   10/18/19 1445 10/18/19 1500 10/18/19 1515 10/18/19 1530  BP: (!) 161/108 (!) 184/116 (!) 164/110 (!) 190/121  Pulse: (!) 58 (!) 59 60 60  Resp: 14 20 14 16   Temp:      TempSrc:      SpO2: 97% 97% 96% 98%   Eyes: PERRL, lids and conjunctivae normal ENMT: Mucous membranes are moist. Posterior pharynx clear of any exudate or lesions.Normal dentition.  Neck: normal, supple, no masses, no thyromegaly Respiratory: clear to auscultation bilaterally, no wheezing, no crackles. Normal respiratory effort. No accessory muscle use.  Cardiovascular: Regular rate and  rhythm, no murmurs / rubs / gallops. No extremity edema. 2+ pedal pulses. No carotid bruits.  Abdomen: no tenderness, no masses palpated. No hepatosplenomegaly. Bowel sounds positive.  Musculoskeletal: no clubbing / cyanosis. No joint deformity upper and lower extremities. Good ROM, no contractures. Normal muscle tone.  Skin: no rashes, lesions, ulcers. No induration Neurologic: Muscle strength 4/5 on right side compared to left, no significant facial droop  Psychiatric: Normal judgment and insight. Alert and oriented x 3. Normal mood.     Labs on Admission: I have personally reviewed following labs and imaging studies  CBC: Recent Labs  Lab 10/18/19 1158 10/18/19 1200  WBC 3.8*  --   NEUTROABS 2.1  --   HGB 17.3* 17.7*  HCT 52.9* 52.0  MCV 88.9  --   PLT 176  --    Basic Metabolic Panel: Recent Labs  Lab 10/18/19 1158 10/18/19 1200  NA 139 140  K 3.8 3.6  CL 108 105  CO2 21*  --   GLUCOSE 108* 104*  BUN 10 12  CREATININE 1.28* 1.20  CALCIUM 8.6*  --    GFR: CrCl cannot be calculated (Unknown ideal weight.). Liver Function Tests: Recent Labs  Lab 10/18/19 1158  AST 22  ALT 32  ALKPHOS 61  BILITOT 1.2  PROT 7.3  ALBUMIN 3.9   No results for input(s): LIPASE, AMYLASE in the last 168 hours. No results for input(s): AMMONIA in the last 168 hours. Coagulation Profile: Recent Labs  Lab 10/18/19 1158  INR 1.0   Cardiac Enzymes: No results for input(s): CKTOTAL, CKMB, CKMBINDEX, TROPONINI in the last 168 hours. BNP (last 3 results) No results for input(s): PROBNP in the last 8760 hours. HbA1C: No results for input(s): HGBA1C in the last 72 hours. CBG: Recent Labs  Lab 10/18/19 1156  GLUCAP 90   Lipid Profile: No results for input(s): CHOL, HDL, LDLCALC, TRIG, CHOLHDL, LDLDIRECT in the last 72 hours. Thyroid Function Tests: No results for input(s): TSH, T4TOTAL, FREET4, T3FREE, THYROIDAB in the last 72 hours. Anemia Panel: No results for input(s):  VITAMINB12, FOLATE, FERRITIN, TIBC, IRON, RETICCTPCT in the last 72 hours. Urine analysis:    Component Value Date/Time   COLORURINE YELLOW 10/18/2019 1415   APPEARANCEUR CLEAR 10/18/2019 1415   LABSPEC >1.046 (H) 10/18/2019 1415   PHURINE 5.0 10/18/2019 1415   GLUCOSEU NEGATIVE 10/18/2019 1415   HGBUR NEGATIVE 10/18/2019 1415   BILIRUBINUR NEGATIVE 10/18/2019 1415   BILIRUBINUR negative 11/30/2017 1422   KETONESUR 5 (A) 10/18/2019 1415   PROTEINUR 30 (A) 10/18/2019 1415   UROBILINOGEN 2.0 (A) 11/30/2017 1422   UROBILINOGEN 1.0 10/30/2017 1030   NITRITE NEGATIVE 10/18/2019 1415   LEUKOCYTESUR NEGATIVE 10/18/2019 1415    Radiological Exams on Admission: CT Code Stroke CTA Head W/WO contrast  Result Date: 10/18/2019 CLINICAL DATA:  Right-sided weakness.  History of stroke. EXAM: CT ANGIOGRAPHY HEAD AND NECK CT PERFUSION BRAIN TECHNIQUE: Multidetector CT imaging of the head and neck was performed using the standard protocol during bolus administration of intravenous contrast. Multiplanar CT image reconstructions and MIPs were obtained to evaluate the vascular anatomy. Carotid stenosis measurements (when applicable) are obtained utilizing NASCET criteria, using the distal internal carotid diameter as the denominator. Multiphase CT imaging of the brain was performed following IV bolus contrast injection. Subsequent parametric perfusion maps were calculated using RAPID software. CONTRAST:  14mL OMNIPAQUE IOHEXOL 350 MG/ML SOLN COMPARISON:  CT head earlier today. CT head 09/23/2019. MRI 10/28/2016 FINDINGS: CTA NECK FINDINGS Aortic arch: Standard branching. Imaged portion shows no evidence of aneurysm or dissection. No significant stenosis of the major arch vessel origins. Right carotid system: Right carotid artery widely patent without stenosis. Minimal atherosclerotic disease right carotid bifurcation. Left carotid system: Atherosclerotic disease left carotid bulb without significant stenosis.  Vertebral arteries: Both vertebral arteries widely patent to the basilar without stenosis. Skeleton: Cervical spondylosis. Multilevel spinal and foraminal stenosis due to spurring. Other neck: Negative for mass or adenopathy. Upper chest: Lung apices clear bilaterally. Review of the MIP images confirms the above findings CTA HEAD FINDINGS Anterior circulation: Cavernous carotid widely patent bilaterally. Mild atherosclerotic disease. Anterior and middle cerebral arteries patent bilaterally without stenosis. Negative for aneurysm. Posterior circulation: Both vertebral arteries patent to the basilar. PICA patent bilaterally. Basilar tortuous but patent bilaterally without significant stenosis. Superior cerebellar and posterior cerebral arteries patent bilaterally without stenosis. Venous sinuses: Limited venous enhancement due to arterial phase scanning Anatomic variants: None Review of the MIP images confirms the above findings CT Brain Perfusion Findings: ASPECTS: 10 CBF (<30%) Volume: 33mL Perfusion (Tmax>6.0s) volume: 0ML Mismatch Volume: 19mL Infarction Location:None IMPRESSION: 1. CT perfusion negative for acute ischemia or infarct 2. Negative for intracranial large vessel occlusion 3. No significant carotid or vertebral artery stenosis in the neck. 4. These results were called by telephone at the time of interpretation on 10/18/2019 at 12:29 pm to provider Erlinda Hong, who verbally acknowledged these results. Electronically Signed   By: Franchot Gallo M.D.   On: 10/18/2019 12:29   CT Code Stroke CTA Neck W/WO contrast  Result Date: 10/18/2019 CLINICAL DATA:  Right-sided weakness.  History of stroke. EXAM: CT ANGIOGRAPHY HEAD AND NECK CT PERFUSION BRAIN TECHNIQUE: Multidetector CT imaging of the head and neck was performed using the standard protocol during bolus administration of intravenous contrast. Multiplanar CT image reconstructions and MIPs were obtained to evaluate the vascular anatomy. Carotid stenosis  measurements (when applicable) are obtained utilizing NASCET criteria, using the distal internal carotid diameter as the denominator. Multiphase CT imaging of the brain was performed following IV bolus contrast injection. Subsequent parametric perfusion maps were calculated using RAPID software. CONTRAST:  165mL OMNIPAQUE IOHEXOL 350 MG/ML SOLN COMPARISON:  CT head earlier today. CT head 09/23/2019. MRI 10/28/2016 FINDINGS: CTA NECK FINDINGS Aortic arch: Standard branching. Imaged portion shows no evidence of aneurysm or dissection. No significant stenosis of the major arch vessel origins. Right carotid system: Right carotid artery widely patent without stenosis. Minimal atherosclerotic disease right carotid bifurcation. Left carotid system: Atherosclerotic disease left carotid bulb without significant stenosis. Vertebral arteries: Both vertebral arteries widely patent to the basilar without stenosis. Skeleton: Cervical spondylosis. Multilevel spinal and foraminal stenosis due to spurring. Other neck: Negative for mass or adenopathy. Upper chest: Lung apices clear bilaterally. Review of the MIP images confirms the above  findings CTA HEAD FINDINGS Anterior circulation: Cavernous carotid widely patent bilaterally. Mild atherosclerotic disease. Anterior and middle cerebral arteries patent bilaterally without stenosis. Negative for aneurysm. Posterior circulation: Both vertebral arteries patent to the basilar. PICA patent bilaterally. Basilar tortuous but patent bilaterally without significant stenosis. Superior cerebellar and posterior cerebral arteries patent bilaterally without stenosis. Venous sinuses: Limited venous enhancement due to arterial phase scanning Anatomic variants: None Review of the MIP images confirms the above findings CT Brain Perfusion Findings: ASPECTS: 10 CBF (<30%) Volume: 37mL Perfusion (Tmax>6.0s) volume: 0ML Mismatch Volume: 17mL Infarction Location:None IMPRESSION: 1. CT perfusion negative for  acute ischemia or infarct 2. Negative for intracranial large vessel occlusion 3. No significant carotid or vertebral artery stenosis in the neck. 4. These results were called by telephone at the time of interpretation on 10/18/2019 at 12:29 pm to provider Erlinda Hong, who verbally acknowledged these results. Electronically Signed   By: Franchot Gallo M.D.   On: 10/18/2019 12:29   MR BRAIN WO CONTRAST  Result Date: 10/18/2019 CLINICAL DATA:  Right-sided facial droop and weakness. EXAM: MRI HEAD WITHOUT CONTRAST TECHNIQUE: Multiplanar, multiecho pulse sequences of the brain and surrounding structures were obtained without intravenous contrast. COMPARISON:  Head CT, CTA, and CTP 10/18/2019 and MRI 10/28/2016 FINDINGS: Brain: There is no evidence of acute infarct, intracranial hemorrhage, mass, midline shift, or extra-axial fluid collection. Patchy T2 hyperintensities in the cerebral white matter and pons have not significantly changed from the prior MRI and are nonspecific but compatible with severely age advanced chronic small vessel ischemic disease. There is mild cerebral atrophy. Vascular: Major intracranial vascular flow voids are preserved. Skull and upper cervical spine: Unremarkable bone marrow signal. Sinuses/Orbits: Unremarkable orbits. Paranasal sinuses and mastoid air cells are clear. Other: None. IMPRESSION: 1. No acute intracranial abnormality. 2. Severe chronic small vessel ischemic disease. Electronically Signed   By: Logan Bores M.D.   On: 10/18/2019 16:28   CT Code Stroke Cerebral Perfusion with contrast  Result Date: 10/18/2019 CLINICAL DATA:  Right-sided weakness.  History of stroke. EXAM: CT ANGIOGRAPHY HEAD AND NECK CT PERFUSION BRAIN TECHNIQUE: Multidetector CT imaging of the head and neck was performed using the standard protocol during bolus administration of intravenous contrast. Multiplanar CT image reconstructions and MIPs were obtained to evaluate the vascular anatomy. Carotid stenosis  measurements (when applicable) are obtained utilizing NASCET criteria, using the distal internal carotid diameter as the denominator. Multiphase CT imaging of the brain was performed following IV bolus contrast injection. Subsequent parametric perfusion maps were calculated using RAPID software. CONTRAST:  176mL OMNIPAQUE IOHEXOL 350 MG/ML SOLN COMPARISON:  CT head earlier today. CT head 09/23/2019. MRI 10/28/2016 FINDINGS: CTA NECK FINDINGS Aortic arch: Standard branching. Imaged portion shows no evidence of aneurysm or dissection. No significant stenosis of the major arch vessel origins. Right carotid system: Right carotid artery widely patent without stenosis. Minimal atherosclerotic disease right carotid bifurcation. Left carotid system: Atherosclerotic disease left carotid bulb without significant stenosis. Vertebral arteries: Both vertebral arteries widely patent to the basilar without stenosis. Skeleton: Cervical spondylosis. Multilevel spinal and foraminal stenosis due to spurring. Other neck: Negative for mass or adenopathy. Upper chest: Lung apices clear bilaterally. Review of the MIP images confirms the above findings CTA HEAD FINDINGS Anterior circulation: Cavernous carotid widely patent bilaterally. Mild atherosclerotic disease. Anterior and middle cerebral arteries patent bilaterally without stenosis. Negative for aneurysm. Posterior circulation: Both vertebral arteries patent to the basilar. PICA patent bilaterally. Basilar tortuous but patent bilaterally without significant stenosis. Superior cerebellar and posterior  cerebral arteries patent bilaterally without stenosis. Venous sinuses: Limited venous enhancement due to arterial phase scanning Anatomic variants: None Review of the MIP images confirms the above findings CT Brain Perfusion Findings: ASPECTS: 10 CBF (<30%) Volume: 69mL Perfusion (Tmax>6.0s) volume: 0ML Mismatch Volume: 19mL Infarction Location:None IMPRESSION: 1. CT perfusion negative for  acute ischemia or infarct 2. Negative for intracranial large vessel occlusion 3. No significant carotid or vertebral artery stenosis in the neck. 4. These results were called by telephone at the time of interpretation on 10/18/2019 at 12:29 pm to provider Erlinda Hong, who verbally acknowledged these results. Electronically Signed   By: Franchot Gallo M.D.   On: 10/18/2019 12:29   CT HEAD CODE STROKE WO CONTRAST  Result Date: 10/18/2019 CLINICAL DATA:  Code stroke.  Acute neurologic deficit EXAM: CT HEAD WITHOUT CONTRAST TECHNIQUE: Contiguous axial images were obtained from the base of the skull through the vertex without intravenous contrast. COMPARISON:  CT head 09/23/2019 FINDINGS: Brain: Image quality degraded by extensive motion. Other identified previously, there is diffuse chronic microvascular ischemic changes in the white matter. Allowing for motion, no acute cortical infarct, hemorrhage, mass. Ventricle size normal. Vascular: Negative for hyperdense vessel Skull: Negative Sinuses/Orbits: Negative Other: None ASPECTS ([place] Stroke Program Early CT Score) - Ganglionic level infarction (caudate, lentiform nuclei, internal capsule, insula, M1-M3 cortex): 7 - Supraganglionic infarction (M4-M6 cortex): 3 Total score (0-10 with 10 being normal): 10 IMPRESSION: 1. Image quality degraded by extensive motion. 2. No acute abnormality identified allowing for motion 3. Extensive chronic microvascular ischemic changes noted on the prior study. 4. ASPECTS is 10 Electronically Signed   By: Franchot Gallo M.D.   On: 10/18/2019 12:18    EKG: Independently reviewed.  Assessment/Plan Active Problems:   CVA (cerebral vascular accident) ([place])  TIA Probably related to uncontrolled HTN As per neurology will allow 2 days of permissive HTN, PRN labetalol for 200/110 BP ASA and Statin Restart home BP meds in 2 days  Polycythemia HCT persistently >50% multiple occasions Refer to outpt hematology for further  work-up  AAA Add Coreg Annual image f/u  IIDM Sliding scale for now for tighter control, A1C 6.9 last month, can go home with PO DM meds.  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

## 2019-10-18 NOTE — Progress Notes (Signed)
Obtained report from ED RN, patient will be transported to [id number]

## 2019-10-18 NOTE — Code Documentation (Signed)
Stroke Response Nurse Documentation Code Documentation  Javier Rivera is a 59 y.o. male arriving to [facility] ED via [facility] on 3/12 with past medical hx of DM, HTN, renal insufficiency, & stroke with residual right sided weakness. Code stroke was activated by EMS. Patient from home where he was LKW at 3/12 0030 and now complaining of increased right sided weakness.   Stroke team at the bedside on patient arrival. Labs drawn and patient cleared for CT by EDP. Patient to CT with team. NIHSS 11, see documentation for details and code stroke times. VAN score negative. Patient with disoriented, right facial droop, right arm weakness, bilateral leg weakness and right decreased sensation on exam. Difficult assessment as patient did not wish to participate. Stated too much was happening.   CT head completed. Patient is not a candidate for tPA due to out of the window. Care/Plan Q2h VS/Neuro checks. Bedside handoff with ED RN Velna Hatchet.    Leverne Humbles Stroke Response RN

## 2019-10-18 NOTE — Consult Note (Addendum)
NEURO HOSPITALIST  CONSULT   Requesting Physician: Dr. Alvino Chapel    Chief Complaint:  Right side weakness  History obtained from:  Patient  / Chart   HPI:                                                                                                                                         Javier Rivera is an 59 y.o. male  With PMH CVA (right side weakness, memory deficits), HTN, DM who presented to [facility] ED as a code stroke for worsening right side weakness  and right facial droop.  Per patient he was last normal at about 0030 this morning. At that time he noticed some right side weakness that he states is more than his usual right side weakness. Denies any vision changes, SOB, difficulty walking.  ED course:  CTH: no hemorrhage,  CTA: no LVO CTP: 0  10/18/2015:  Left pontine infarct; presented with right side weakness.  tPA Given: no outside of window Modified Rankin: Rankin Score=2  Past Medical History:  Diagnosis Date  . Diabetes mellitus without complication ([place])   . Hypertension   . Renal disorder    pt stated that masses were found on his kidneys  . Renal insufficiency   . Stroke [facility])    r sided weakness, memory    Past Surgical History:  Procedure Laterality Date  . KNEE SURGERY     bilateral    Family History  Problem Relation Age of Onset  . Diabetes Mellitus II Mother   . Hypertension Mother   . Diabetes Mellitus II Sister   . Hypertension Sister    Social History:  reports that he has quit smoking. His smoking use included cigarettes. He has a 5.00 pack-year smoking history. He has never used smokeless tobacco. He reports that he does not drink alcohol or use drugs.  Allergies:  Allergies  Allergen Reactions  . Clonidine Derivatives Other (See Comments)    Pt reported the sweats and shakes  . Lisinopril Palpitations and Other (See Comments)    "made me real sick, made my heart rate scatter"     Medications:  No current facility-administered medications for this encounter.   Current Outpatient Medications  Medication Sig Dispense Refill  . amLODipine (NORVASC) 10 MG tablet Take 1 tablet (10 mg total) by mouth daily. 30 tablet 0  . aspirin EC 81 MG tablet Take 1 tablet (81 mg total) by mouth daily. (Patient not taking: Reported on 09/23/2019) 90 tablet 2  . atorvastatin (LIPITOR) 40 MG tablet Take 1 tablet (40 mg total) by mouth daily at 6 PM. 30 tablet 0  . cephALEXin (KEFLEX) 500 MG capsule Take 1 capsule (500 mg total) by mouth every 12 (twelve) hours. 10 capsule 0  . gabapentin (NEURONTIN) 300 MG capsule Take 1 capsule (300 mg total) by mouth 3 (three) times daily. (Patient not taking: Reported on 09/23/2019) 90 capsule 3  . hydrochlorothiazide (MICROZIDE) 12.5 MG capsule Take 1 capsule (12.5 mg total) by mouth daily. 30 capsule 0  . losartan (COZAAR) 50 MG tablet Take 1 tablet (50 mg total) by mouth daily. 30 tablet 0     ROS:                                                                                                                                       ROS was performed and is negative except as noted in HPI    General Examination:                                                                                                      SpO2 100 %.  Physical Exam  Constitutional: Appears well-developed and well-nourished.  Psych: Affect appropriate to situation Eyes: Normal external eye and conjunctiva. HENT: Normocephalic, no lesions, without obvious abnormality.   Musculoskeletal-no joint tenderness, deformity or swelling Cardiovascular: Normal rate and regular rhythm.  Respiratory: Effort normal, non-labored breathing saturations WNL GI: Soft.  No distension. There is no tenderness.  Skin: WDI  Neurological Examination Mental Status: Alert,  oriented, stated month was may.  Patient exam lacks some effort. Speech fluent without evidence of aphasia.  Able to follow  commands without difficulty. Cranial Nerves: II:  Visual fields grossly normal,  III,IV, VI: ptosis not present, extra-ocular motions intact bilaterally, pupils equal, round, reactive to light and accommodation V,VII:right facial droop, facial light touch sensation normal bilaterally VIII: hearing normal bilaterally IX,X: uvula rises midline XI: bilateral shoulder shrug XII: midline tongue extension Motor: Abe to lift all 4 extremities anti gravity. Drift noted in both legs ,but exam lacks effort. Drift also with RUE. Tone and bulk:normal  tone throughout; no atrophy noted Sensory: decreased sensation in right leg Cerebellar: No gross ataxia noted Gait: deferred   Lab Results: Basic Metabolic Panel: Recent Labs  Lab 10/18/19 1200  NA 140  K 3.6  CL 105  GLUCOSE 104*  BUN 12  CREATININE 1.20    CBC: Recent Labs  Lab 10/18/19 1158 10/18/19 1200  WBC 3.8*  --   NEUTROABS 2.1  --   HGB 17.3* 17.7*  HCT 52.9* 52.0  MCV 88.9  --   PLT 176  --      CBG: Recent Labs  Lab 10/18/19 1156  GLUCAP 90    Imaging: CT HEAD CODE STROKE WO CONTRAST  Result Date: 10/18/2019 CLINICAL DATA:  Code stroke.  Acute neurologic deficit EXAM: CT HEAD WITHOUT CONTRAST TECHNIQUE: Contiguous axial images were obtained from the base of the skull through the vertex without intravenous contrast. COMPARISON:  CT head 09/23/2019 FINDINGS: Brain: Image quality degraded by extensive motion. Other identified previously, there is diffuse chronic microvascular ischemic changes in the white matter. Allowing for motion, no acute cortical infarct, hemorrhage, mass. Ventricle size normal. Vascular: Negative for hyperdense vessel Skull: Negative Sinuses/Orbits: Negative Other: None ASPECTS ([place] Stroke Program Early CT Score) - Ganglionic level infarction (caudate, lentiform nuclei,  internal capsule, insula, M1-M3 cortex): 7 - Supraganglionic infarction (M4-M6 cortex): 3 Total score (0-10 with 10 being normal): 10 IMPRESSION: 1. Image quality degraded by extensive motion. 2. No acute abnormality identified allowing for motion 3. Extensive chronic microvascular ischemic changes noted on the prior study. 4. ASPECTS is 10 Electronically Signed   By: Franchot Gallo M.D.   On: 10/18/2019 12:18       Laurey Morale, MSN, NP-C [facility] [phone]  10/18/2019, 12:17 PM   Attending physician note to follow with Assessment and plan .   Assessment: 59 y.o. male with PMH CVA, HTN, DM who presented to [facility] ED as a code stroke for worsening right side weakness. 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 However, as per EMS, pt was able to ambulate to stretch without difficulty and no significant right leg weakness noted, but patient states he was weaker than usual on the right. TPA was not offered d/t patient presenting outside of the window with a LSN of 0030. CTA did not show an LVO so patient was not a candidate for IR. Stroke Risk Factors - diabetes mellitus and hypertension   Recommendations: -- BP goal : Permissive HTN upto 220/110 mmHg (for 24-48 post admission ) then gradually normalized in 2-3 days --MRI Brain to evaluate for stroke --TTE done in 09/2019 unrenarkable --A1C and LDL, UDS --PT/OT/speech --ASA PR once --resume home ASA and lipitor once pass swallow. - will follow  Rosalin Hawking, MD PhD Stroke Neurology 10/18/2019 2:53 PM  --please page stroke NP  Or  PA  Or MD from 8am -4 pm  as this patient from this time will be  followed by the stroke.   You can look them up on [email or web address]  Password [email or web address]

## 2019-10-18 NOTE — ED Notes (Signed)
ED Provider at bedside. 

## 2019-10-19 ENCOUNTER — Other Ambulatory Visit: Payer: Self-pay

## 2019-10-19 DIAGNOSIS — R001 Bradycardia, unspecified: Secondary | ICD-10-CM

## 2019-10-19 DIAGNOSIS — N182 Chronic kidney disease, stage 2 (mild): Secondary | ICD-10-CM

## 2019-10-19 DIAGNOSIS — I161 Hypertensive emergency: Secondary | ICD-10-CM

## 2019-10-19 DIAGNOSIS — N183 Chronic kidney disease, stage 3 unspecified: Secondary | ICD-10-CM | POA: Insufficient documentation

## 2019-10-19 LAB — GLUCOSE, CAPILLARY
Glucose-Capillary: 117 mg/dL — ABNORMAL HIGH (ref 70–99)
Glucose-Capillary: 142 mg/dL — ABNORMAL HIGH (ref 70–99)
Glucose-Capillary: 111 mg/dL — ABNORMAL HIGH (ref 70–99)
Glucose-Capillary: 122 mg/dL — ABNORMAL HIGH (ref 70–99)

## 2019-10-19 LAB — HEMOGLOBIN A1C
Hgb A1c MFr Bld: 6.9 % — ABNORMAL HIGH (ref 4.8–5.6)
Mean Plasma Glucose: 151.33 mg/dL

## 2019-10-19 LAB — LIPID PANEL
Cholesterol: 164 mg/dL (ref 0–200)
HDL: 35 mg/dL — ABNORMAL LOW
LDL Cholesterol: 118 mg/dL — ABNORMAL HIGH (ref 0–99)
Total CHOL/HDL Ratio: 4.7 ratio
Triglycerides: 54 mg/dL
VLDL: 11 mg/dL (ref 0–40)

## 2019-10-19 MED ORDER — POLYETHYLENE GLYCOL 3350 17 G PO PACK
17.0000 g | PACK | Freq: Two times a day (BID) | ORAL | Status: DC
  Administered 2019-10-19 – 2019-10-24 (×10): 17 g via ORAL
  Filled 2019-10-19 (×10): qty 1

## 2019-10-19 MED ORDER — ATORVASTATIN CALCIUM 80 MG PO TABS
80.0000 mg | ORAL_TABLET | Freq: Every day | ORAL | Status: DC
  Administered 2019-10-19 – 2019-10-24 (×5): 80 mg via ORAL
  Filled 2019-10-19 (×6): qty 1

## 2019-10-19 NOTE — Progress Notes (Addendum)
PROGRESS NOTE    Lovett Benda  [id number] DOB: 01/15/1961 DOA: 10/18/2019 PCP: Patient, No Pcp Per    Brief Narrative:  Patient was admitted to the hospital with hypertensive encephalopathy, hypertensive emergency, to rule out TIA.  59 year old male who presented with right-sided weakness.  He does have significant past medical history for hypertension and CVA with chronic right-sided weakness since 2017.  He also has type 2 diabetes mellitus, chronic kidney disease, polysubstance abuse and history of abdominal aortic aneurysm.  Patient reported worsening right-sided weakness and right facial droop.  Apparently he went to sleep at his usual state of health, he woke up around 7 AM with the above-mentioned neurologic deficits.  On his initial physical examination his blood pressure was 161/108, heart rate 58, respiratory rate 14, oxygen saturation 97%, his lungs were clear to auscultation bilaterally, heart S1-S2 present rhythmic, soft abdomen and no lower extremity edema.  Right-sided weakness 4 out of 5, no significant facial droop. Sodium 140, potassium 3.6, chloride 105, BUN 12, creatinine 1.28, white count 3.8, hemoglobin 7.3, hematocrit 52.9, platelets 176.  SARS COVID-19 was negative.  Urinalysis specific gravity more than 1.046, 0-5 white cells, 0-5 red cells.  Urine drug screen negative, alcohol less than 10.Marland Kitchen  Head CT negative for acute changes.  EKG 60 bpm, left axis deviation, left anterior fascicular block, first-degree AV block, sinus rhythm, no significant ST segment T wave changes.  Blood pressure has improved with oral antihypertensives.   Assessment & Plan:   Principal Problem:   Hypertensive emergency Active Problems:   Hyperlipidemia   Diabetes mellitus type 2, controlled ([place])   Ascending aorta dilatation (HCC)   CVA (cerebral vascular accident) ([place])   CKD (chronic kidney disease), stage III  1. Hypertensive emergency. Blood pressure this am, 129/87 mmHG. Patient  with no chest pain or dyspnea, continue to have right lower side weakness.   Patient with bradycardia will hold on carvedilol and will continue with hctz dor now.   2. Hx of ischemic CVA with right sided weakness. Patient continue to have right sided weakness. CT angiography head with no significant stenosis. Brain MRI with no acute CVA, but severe chronic small ischemic changes. Will continue aspirin and high dose satin therapy, will consult PT and OT, follow with neurology recommendations.   3. T2DM/ dyslipidemia. Will continue glucose cover and monitoring with insulin sliding scale. Patient is tolerating po well.   Continue with atorvastatin.   4. CKD stage 2. Stable renal function with serum cr at 1,20 with K at 3,6 and serum bicarbonate at 21.   5. Bradyarrhythmia. Positive bradycardia, telemetry personally reviewed, patient with sinus exit block, ekg with first degree and right bundle with left anterior fascicular block. Will hold on carvedilol.  Echocardiogram from 02/21, with preserved LV systolic function with hypertrophy at the basal septal segment. 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   DVT prophylaxis: heparin   Code Status: full Family Communication: no family at the bedside  Disposition Plan/ discharge barriers: patient from home, dc barrier persistent worsening focal neurologic deficit, needs further evaluation.    Consultants:   Neurology   Procedures:     Antimicrobials:       Subjective: Patient continue to have right sided weakness, no nausea or vomiting, no chest pain or dyspnea. At home uses a walker.   Objective: Vitals:   10/19/19 0019 10/19/19 0455 10/19/19 0821 10/19/19 1214  BP: 119/78 125/89 125/85 129/87  Pulse: 64 97 63 61  Resp: 19 17  19 19  Temp: 98.9 F (37.2 C) 98.4 F (36.9 C) 98.6 F (37 C) (!) 97.4 F (36.3 C)  TempSrc: Oral Oral Oral Oral  SpO2: 98% 97% 95% 100%    Intake/Output Summary (Last 24 hours) at 10/19/2019 1405 Last  data filed at 10/19/2019 1325 Gross per 24 hour  Intake --  Output 950 ml  Net -950 ml   There were no vitals filed for this visit.  Examination:   General: Not in pain or dyspnea, deconditioned  Neurology: Awake and alert, non focal. Decrease power 4/5 proximal and distal on right side.  E ENT: no pallor, no icterus, oral mucosa moist Cardiovascular: No JVD. S1-S2 present, rhythmic, no gallops, rubs, or murmurs. No lower extremity edema. Pulmonary: vesicular breath sounds bilaterally, adequate air movement, no wheezing, rhonchi or rales. Gastrointestinal. Abdomen with no organomegaly, non tender, no rebound or guarding Skin. No rashes Musculoskeletal: no joint deformities     Data Reviewed: I have personally reviewed following labs and imaging studies  CBC: Recent Labs  Lab 10/18/19 1158 10/18/19 1200  WBC 3.8*  --   NEUTROABS 2.1  --   HGB 17.3* 17.7*  HCT 52.9* 52.0  MCV 88.9  --   PLT 176  --    Basic Metabolic Panel: Recent Labs  Lab 10/18/19 1158 10/18/19 1200  NA 139 140  K 3.8 3.6  CL 108 105  CO2 21*  --   GLUCOSE 108* 104*  BUN 10 12  CREATININE 1.28* 1.20  CALCIUM 8.6*  --    GFR: CrCl cannot be calculated (Unknown ideal weight.). Liver Function Tests: Recent Labs  Lab 10/18/19 1158  AST 22  ALT 32  ALKPHOS 61  BILITOT 1.2  PROT 7.3  ALBUMIN 3.9   No results for input(s): LIPASE, AMYLASE in the last 168 hours. No results for input(s): AMMONIA in the last 168 hours. Coagulation Profile: Recent Labs  Lab 10/18/19 1158  INR 1.0   Cardiac Enzymes: No results for input(s): CKTOTAL, CKMB, CKMBINDEX, TROPONINI in the last 168 hours. BNP (last 3 results) No results for input(s): PROBNP in the last 8760 hours. HbA1C: Recent Labs    10/19/19 0424  HGBA1C 6.9*   CBG: Recent Labs  Lab 10/18/19 1156 10/19/19 0606 10/19/19 1214  GLUCAP 90 117* 142*   Lipid Profile: Recent Labs    10/19/19 0424  CHOL 164  HDL 35*  LDLCALC 118*   TRIG 54  CHOLHDL 4.7   Thyroid Function Tests: No results for input(s): TSH, T4TOTAL, FREET4, T3FREE, THYROIDAB in the last 72 hours. Anemia Panel: No results for input(s): VITAMINB12, FOLATE, FERRITIN, TIBC, IRON, RETICCTPCT in the last 72 hours.    Radiology Studies: I have reviewed all of the imaging during this hospital visit personally     Scheduled Meds: . aspirin EC  81 mg Oral Daily  . atorvastatin  80 mg Oral q1800  . carvedilol  3.125 mg Oral BID WC  . heparin  5,000 Units Subcutaneous Q8H  . hydrochlorothiazide  12.5 mg Oral Daily  . insulin aspart  0-9 Units Subcutaneous TID WC   Continuous Infusions:   LOS: 1 day        Lachanda Buczek Gerome Apley, MD

## 2019-10-19 NOTE — Progress Notes (Signed)
STROKE TEAM PROGRESS NOTE   HISTORY OF PRESENT ILLNESS (per record) Javier Rivera is an 59 y.o. male  With PMH CVA (right side weakness, memory deficits), HTN, DM who presented to [facility] ED as a code stroke for worsening right side weakness  and right facial droop. Per patient he was last normal at about 0030 this morning. At that time he noticed some right side weakness that he states is more than his usual right side weakness. Denies any vision changes, SOB, difficulty walking. ED course:  CTH: no hemorrhage,  CTA: no LVO CTP: 0 10/18/2015:  Left pontine infarct; presented with right side weakness. tPA Given: no outside of window Modified Rankin: Rankin Score=2   INTERVAL HISTORY His family is not at bedside. No events overnight He says he had a hard time sleeping last night and is tired.     OBJECTIVE Vitals:   10/19/19 0019 10/19/19 0455 10/19/19 0821 10/19/19 1214  BP: 119/78 125/89 125/85 129/87  Pulse: 64 97 63 61  Resp: 19 17 19 19   Temp: 98.9 F (37.2 C) 98.4 F (36.9 C) 98.6 F (37 C) (!) 97.4 F (36.3 C)  TempSrc: Oral Oral Oral Oral  SpO2: 98% 97% 95% 100%    CBC:  Recent Labs  Lab 10/18/19 1158 10/18/19 1200  WBC 3.8*  --   NEUTROABS 2.1  --   HGB 17.3* 17.7*  HCT 52.9* 52.0  MCV 88.9  --   PLT 176  --     Basic Metabolic Panel:  Recent Labs  Lab 10/18/19 1158 10/18/19 1200  NA 139 140  K 3.8 3.6  CL 108 105  CO2 21*  --   GLUCOSE 108* 104*  BUN 10 12  CREATININE 1.28* 1.20  CALCIUM 8.6*  --     Lipid Panel:     Component Value Date/Time   CHOL 164 10/19/2019 0424   TRIG 54 10/19/2019 0424   HDL 35 (L) 10/19/2019 0424   CHOLHDL 4.7 10/19/2019 0424   VLDL 11 10/19/2019 0424   LDLCALC 118 (H) 10/19/2019 0424   HgbA1c:  Lab Results  Component Value Date   HGBA1C 6.9 (H) 10/19/2019   Urine Drug Screen:     Component Value Date/Time   LABOPIA NONE DETECTED 10/18/2019 1415   COCAINSCRNUR NONE DETECTED 10/18/2019 1415   LABBENZ  NONE DETECTED 10/18/2019 1415   AMPHETMU NONE DETECTED 10/18/2019 1415   THCU NONE DETECTED 10/18/2019 1415   LABBARB NONE DETECTED 10/18/2019 1415    Alcohol Level     Component Value Date/Time   ETH <10 10/18/2019 1157    IMAGING  CT Code Stroke CTA Head W/WO contrast CT Code Stroke CTA Neck W/WO contrast CT Code Stroke Cerebral Perfusion with contrast 10/18/2019 IMPRESSION:  1. CT perfusion negative for acute ischemia or infarct  2. Negative for intracranial large vessel occlusion  3. No significant carotid or vertebral artery stenosis in the neck.   MR BRAIN WO CONTRAST 10/18/2019 IMPRESSION:  1. No acute intracranial abnormality.  2. Severe chronic small vessel ischemic disease.   CT HEAD CODE STROKE WO CONTRAST 10/18/2019 IMPRESSION:  1. Image quality degraded by extensive motion.  2. No acute abnormality identified allowing for motion  3. Extensive chronic microvascular ischemic changes noted on the prior study.  4. ASPECTS is 10   CTA Chest Abdomen and Pelvis 09/23/2019 IMPRESSION: 1. No acute finding. 2. Aneurysmal aortic root with dimensions provided above.Enlarged aortic root with aortic diameters below: Valve 28 mm ;  Sinuses of Valsalva 45 mm ; Sino-tubular junction 42 mm ; Ascending segment 42 mm ; Arch 35 mm ; Descending segment 34 mm.  Recommend annual imaging followup by CTA or MRA.  This recommendation follows 2010 ACCF/AHA/AATS/ACR/ASA/SCA/SCAI/SIR/STS/SVM Guidelines for the Diagnosis and Management of Patients with Thoracic Aortic Disease. Circulation. 2010; [id number]. Aortic aneurysm NOS (ICD10-I71.9) 3. 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  This recommendation follows ACR consensus guidelines: White Paper of the ACR Incidental Findings Committee II on Vascular Findings. J Am Coll Radiol 2013; [id number] 4. Aortic Atherosclerosis (ICD10-I70.0).  Transthoracic Echocardiogram   09/23/2019 IMPRESSIONS  1. Left ventricular ejection fraction, by estimation, is 60 to 65%. The  left ventricle has normal function. The left ventricle has no regional  wall motion abnormalities. There is mild asymmetric left ventricular  hypertrophy of the basal-septal segment.  Left ventricular diastolic parameters are consistent with Grade I  diastolic dysfunction (impaired relaxation).  2. Right ventricular systolic function is normal. The right ventricular  size is normal. Tricuspid regurgitation signal is inadequate for assessing  PA pressure.  3. The mitral valve is grossly normal. No evidence of mitral valve  regurgitation. No evidence of mitral stenosis.  4. The aortic valve is tricuspid. Aortic valve regurgitation is mild. No  aortic stenosis is present.  5. 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 Ascending aortic diameter up to 47  mm. Better characterized on recent CTA and would defer to those  measurements. Aneurysm of the aortic root and ascending aorta.  6. The inferior vena cava is normal in size with greater than 50%  respiratory variability, suggesting right atrial pressure of 3 mmHg.   ECG - SR rate 60 BPM. (See cardiology reading for complete details)  PHYSICAL EXAM Blood pressure 129/87, pulse 61, temperature (!) 97.4 F (36.3 C), temperature source Oral, resp. rate 19, SpO2 100 %.   Exam: NAD, pleasant                  Speech:    Speech is normal; fluent and spontaneous with normal comprehension.  Cognition:    The patient is oriented to person, place, and time;     recent and remote memory intact;     language fluent;    Cranial Nerves:    The pupils are equal, round, and reactive to light.Trigeminal sensation is intact and the muscles of mastication are normal. Right lower facial weakness. The palate elevates in the midline. Hearing intact. Voice is normal. Shoulder shrug is normal. The tongue has normal motion without fasciculations. Bilateral shoulder  shrug intact.   Coordination:  No dysmetria or ataxia  Motor Observation:    no involuntary movements noted. Tone:    Normal muscle tone.     Strength:    He is moving all his extremities equally and anti-gravity, no focal deficit noted, possibly drift RUE, poor effort     Sensation: withdraws x 4          ASSESSMENT/PLAN Mr. Javier Rivera is a 59 y.o. male with history of CVA (right side weakness, memory deficits), HTN, DM, hx of renal mass, AAA and aneurysmal aortic root. He did not receive IV t-PA due to late presentation (>4.5 hours from time of onset).   Likely recrudescence of old stroke, follow clinically outpatient  Resultant No new deficits  Code Stroke CT Head - No acute abnormality identified allowing for motion. Extensive chronic microvascular ischemic changes noted on the prior  study. ASPECTS is 10   CT head - not ordered  MRI head - No acute intracranial abnormality. Severe chronic small vessel ischemic disease.   MRA head - not ordered  CTA H&N - Negative for intracranial large vessel occlusion. No significant carotid or vertebral artery stenosis in the neck.  CT Perfusion - CT perfusion negative for acute ischemia or infarct  Carotid Doppler - CTA neck performed - carotid dopplers not indicated.  2D Echo - 09/23/19 - EF 60 - 65%. No cardiac source of emboli identified. Aneurysm of the aortic root and ascending aorta  Lacey Jensen Virus 2 - negative  LDL - 118  HgbA1c - 6.9  UDS - negative  VTE prophylaxis - [place] Heparin Diet  Diet Order            Diet heart healthy/carb modified Room service appropriate? Yes; Fluid consistency: Thin  Diet effective now              aspirin 81 mg daily prior to admission, now on aspirin 81 mg daily  Patient counseled to be compliant with his antithrombotic medications. Continue ASA when discharged.  Ongoing aggressive stroke risk factor management  Therapy recommendations:  pending  Disposition:   Pending  Hypertension  Home BP meds: Coreg ; HCTZ  Current BP meds: Coreg ; HCTZ ; Amlodipine  Stable . Permissive hypertension (OK if < 220/120) but gradually normalize in 5-7 days  . Long-term BP goal normotensive  Hyperlipidemia  Home Lipid lowering medication: Lipitor 40 mg daily    LDL 118, goal < 70  Current lipid lowering medication: Lipitor 40 mg daily -> increase Lipitor to 80 mg daily  Continue statin at discharge  Diabetes  Home diabetic meds: none   Current diabetic meds: SSI   HgbA1c 6.9, goal < 7.0 Recent Labs    10/18/19 1156 10/19/19 0606 10/19/19 1214  GLUCAP 90 117* 142*    Other Stroke Risk Factors  Former cigarette smoker - quit  Hx stroke/TIA  Other Active Problems  Code status - Full code  Hyperglycemia  CKD stage 2 - creatinine - 1.28->1.20  Leukopenia - 3.8  Hospital day # 1  No new deficits, patient felt his prior stroke symptoms were worse. Likely recrudescence of old stroke. He can stay on ASA and follow clinically. Stroke will sign off.   Personally examined patient and images, and have participated in and made any corrections needed to history, physical, neuro exam,assessment and plan as stated above.  I have personally obtained the history, evaluated lab date, reviewed imaging studies and agree with radiology interpretations.    Sarina Ill, MD Stroke Neurology   A total of 25 minutes was spent for the care of this patient, spent on counseling patient and family on different diagnostic and therapeutic options, counseling and coordination of care, riskd ans benefits of management, compliance, or risk factor reduction and education.   To contact Stroke Continuity provider, please refer to [email or web address] After hours, contact General Neurology

## 2019-10-19 NOTE — Progress Notes (Signed)
PT Cancellation Note  Patient Details Name: Javier Rivera MRN: [id number] DOB: 11/03/1960   Cancelled Treatment:    Reason Eval/Treat Not Completed: Patient declined, no reason specified.  Pt reports he doesn't feel good.  His HR now is 56bpm and irregular.  Pt reports his stomach is cramping and refused OOB PT eval.  Pt said he is constipated.  Nursing notified.  Will attempt PT eval tomorrow.   Loyal Buba 10/19/2019, 3:23 PM

## 2019-10-19 NOTE — Progress Notes (Signed)
MEWS score yellow due to heart rate of 34. Patient assessed, heart rate back up to 61. MD notified. Will continue to monitor.  Gwendolyn Grant, RN

## 2019-10-19 NOTE — Progress Notes (Signed)
OT Cancellation Note  Patient Details Name: Javier Rivera MRN: [id number] DOB: [birth year]   Cancelled Treatment:    Reason Eval/Treat Not Completed: Medical issues which prohibited therapy Pt HR currently 45bpm-47bpm. Due to current bradycardia, pt not appropriate for therapy at this time. OT will continue to follow and return as pt is appropriate and time allows.   [facility] OTR/L Acute Rehabilitation Services Office: [place] 10/19/2019, 1:44 PM

## 2019-10-20 DIAGNOSIS — E785 Hyperlipidemia, unspecified: Secondary | ICD-10-CM

## 2019-10-20 DIAGNOSIS — N1832 Chronic kidney disease, stage 3b: Secondary | ICD-10-CM

## 2019-10-20 DIAGNOSIS — I77810 Thoracic aortic ectasia: Secondary | ICD-10-CM

## 2019-10-20 LAB — GLUCOSE, CAPILLARY
Glucose-Capillary: 98 mg/dL (ref 70–99)
Glucose-Capillary: 141 mg/dL — ABNORMAL HIGH (ref 70–99)
Glucose-Capillary: 103 mg/dL — ABNORMAL HIGH (ref 70–99)
Glucose-Capillary: 114 mg/dL — ABNORMAL HIGH (ref 70–99)

## 2019-10-20 LAB — MAGNESIUM: Magnesium: 2 mg/dL (ref 1.7–2.4)

## 2019-10-20 LAB — BASIC METABOLIC PANEL
Anion gap: 8 (ref 5–15)
BUN: 14 mg/dL (ref 6–20)
CO2: 25 mmol/L (ref 22–32)
Calcium: 8.7 mg/dL — ABNORMAL LOW (ref 8.9–10.3)
Chloride: 103 mmol/L (ref 98–111)
Creatinine, Ser: 1.35 mg/dL — ABNORMAL HIGH (ref 0.61–1.24)
GFR calc Af Amer: >60 mL/min (ref >60)
GFR calc non Af Amer: 57 mL/min — ABNORMAL LOW (ref >60)
Glucose, Bld: 117 mg/dL — ABNORMAL HIGH (ref 70–99)
Potassium: 3.8 mmol/L (ref 3.5–5.1)
Sodium: 136 mmol/L (ref 135–145)

## 2019-10-20 MED ORDER — AMLODIPINE BESYLATE 5 MG PO TABS
5.0000 mg | ORAL_TABLET | Freq: Every day | ORAL | Status: DC
  Administered 2019-10-20: 5 mg via ORAL
  Filled 2019-10-20: qty 1

## 2019-10-20 NOTE — Evaluation (Signed)
Occupational Therapy Evaluation Patient Details Name: Javier Rivera MRN: [id number] DOB: 1961-07-25 Today's Date: 10/20/2019    History of Present Illness Javier Rivera is an 59 y.o. male  With PMH CVA (right side weakness, memory deficits), HTN, DM who presented to [facility] ED as a code stroke for worsening right side weakness  and right facial droop. CT head negative for bleeding, CTA no large vessel occlusion. MRI negative for stroke.    Clinical Impression   This 59 yo male admitted with above presents to acute OT with reporting that pta he was living by himself, but would not answer any more questions. Currently pt is min A +2 when up on his feet due to decreased safety, not always clearing toes of RLE as he walked, and pt is 6'9". He will benefit from acute OT with follow up at SNF. Spoke to RN right before in to see patient due to prior note about EKG--RN cleared [place] to see patient since he is asymptomatic.    Follow Up Recommendations  SNF;Supervision/Assistance - 24 hour    Equipment Recommendations  Other (comment)(TBD next venue)       Precautions / Restrictions Precautions Precautions: Fall Precaution Comments: right leg weakness Restrictions Weight Bearing Restrictions: No Other Position/Activity Restrictions: pt denies dizziness      Mobility Bed Mobility Overal bed mobility: Needs Assistance Bed Mobility: Supine to Sit     Supine to sit: Min guard     General bed mobility comments: Pt sat up EOB with cues  Transfers Overall transfer level: Needs assistance Equipment used: 2 person hand held assist Transfers: Sit to/from Stand Sit to Stand: Min assist;+2 physical assistance;From elevated surface         General transfer comment: Pt initially unsteady in standing but was able to regain his balance while EOB    Balance Overall balance assessment: Needs assistance   Sitting balance-Leahy Scale: Good       Standing balance-Leahy Scale: Poor Standing  balance comment: dynamic standing balance not tested - pt would be more stable with RW support in static standing                           ADL either performed or assessed with clinical judgement   ADL Overall ADL's : Needs assistance/impaired Eating/Feeding: Independent;Sitting   Grooming: Set up;Sitting   Upper Body Bathing: Set up;Sitting   Lower Body Bathing: Set up;Supervison/ safety Lower Body Bathing Details (indicate cue type and reason): +2 min A sit<>stand Upper Body Dressing : Set up;Sitting   Lower Body Dressing: Supervision/safety;Set up Lower Body Dressing Details (indicate cue type and reason): +2 min A sit<>stand Toilet Transfer: Minimal assistance;+2 for physical assistance Toilet Transfer Details (indicate cue type and reason): Bed>door>bed x2 Toileting- Clothing Manipulation and Hygiene: Minimal assistance;+2 for physical assistance Toileting - Clothing Manipulation Details (indicate cue type and reason): sit<>stand             Vision Patient Visual Report: No change from baseline              Pertinent Vitals/Pain Pain Assessment: (generalized but did not rate)     Hand Dominance Right   Extremity/Trunk Assessment Upper Extremity Assessment Upper Extremity Assessment: Overall WFL for tasks assessed   Lower Extremity Assessment Lower Extremity Assessment: RLE deficits/detail RLE Deficits / Details: right quad 3-/5, DF 2/5.  pt denied numbness in right leg   Cervical / Trunk Assessment Cervical / Trunk Assessment: Normal  Communication Communication Communication: No difficulties   Cognition Arousal/Alertness: Awake/alert Behavior During Therapy: Flat affect;Agitated Overall Cognitive Status: No family/caregiver present to determine baseline cognitive functioning                                 General Comments: Pt not talkative and answers questions inconsistently in one word answers.  Pt did comment that he  hasnt been sleeping good even prior to coming to [employer]      Exercises Other Exercises Other Exercises: I talked to pt about exercises to help right leg - long arc quads - focus on gettign knee straight.  pt did 3 reps Other Exercises: Pt educated on DF - and why this would help his walking - pt did 3 reps.        Home Living Family/patient expects to be discharged to:: Private residence Living Arrangements: Alone                               Additional Comments: pt unwilling to answer questions throughout session stating "I don't really feel like talking much now ma'am", "too may questions"      Prior Functioning/Environment          Comments: Pt wouldnt answer questions about  AD and how right leg was doing prior to admit        OT Problem List: Impaired balance (sitting and/or standing);Decreased safety awareness      OT Treatment/Interventions: Self-care/ADL training;DME and/or AE instruction;Patient/family education;Balance training    OT Goals(Current goals can be found in the care plan section) Acute Rehab OT Goals Patient Stated Goal: Pt didnt state goal Time For Goal Achievement: 11/03/19 Potential to Achieve Goals: Good  OT Frequency: Min 2X/week           Co-evaluation PT/OT/SLP Co-Evaluation/Treatment: Yes Reason for Co-Treatment: For patient/therapist safety;Necessary to address cognition/behavior during functional activity PT goals addressed during session: Mobility/safety with mobility;Balance;Strengthening/ROM OT goals addressed during session: ADL's and self-care;Strengthening/ROM      AM-PAC OT "6 Clicks" Daily Activity     Outcome Measure Help from another person eating meals?: None Help from another person taking care of personal grooming?: A Little Help from another person toileting, which includes using toliet, bedpan, or urinal?: A Little Help from another person bathing (including washing, rinsing, drying)?: A Little Help  from another person to put on and taking off regular upper body clothing?: A Little Help from another person to put on and taking off regular lower body clothing?: A Little 6 Click Score: 19   End of Session Equipment Utilized During Treatment: Gait belt  Activity Tolerance: Treatment limited secondary to agitation Patient left: in chair;with call bell/phone within reach;with chair alarm set  OT Visit Diagnosis: Unsteadiness on feet (R26.81);Muscle weakness (generalized) (M62.81)                Time: [id number] OT Time Calculation (min): 20 min Charges:  OT General Charges $OT Visit: 1 Visit OT Evaluation $OT Eval Moderate Complexity: 1 Mod  Cathy  OTR/L Acute [employer] Pager [phone] Office [phone]     10/20/2019, 12:20 PM

## 2019-10-20 NOTE — Evaluation (Signed)
Physical Therapy Evaluation Patient Details Name: Javier Rivera MRN: [id number] DOB: Sep 02, 1960 Today's Date: 10/20/2019   History of Present Illness  Javier Rivera is an 59 y.o. male  With PMH CVA (right side weakness, memory deficits), HTN, DM who presented to [facility] ED as a code stroke for worsening right side weakness  and right facial droop. CT head negative for bleeding, CTA no large vessel occlusion. MRI negative for stroke.     Clinical Impression  Pt reluctantly worked with therapy - he has some right LE weakness making him need +2 to walk safely.  I talked to pt about using tall RW for increased safety - he said he wont use it.  Pt shown some exercises that will help strengthen R quad and DF.  Pts BP today 147/106 and HR 76bpm.  At this point - pt not safe to live alone - would benefit from daily PT in SNF level care - unsure if he will agree to this.  PT will continue to work with pt in acute setting and try to increase safety with his mobilty as pt allows.    Follow Up Recommendations SNF;Supervision/Assistance - 24 hour    Equipment Recommendations  Rolling walker with 5" wheels(Will need tall RW - pt 6'9" - he says he wont use it)    Recommendations for Other Services       Precautions / Restrictions Precautions Precautions: Fall Precaution Comments: right leg weakness Restrictions Weight Bearing Restrictions: No Other Position/Activity Restrictions: pt denies dizziness      Mobility  Bed Mobility Overal bed mobility: Needs Assistance Bed Mobility: Supine to Sit     Supine to sit: Min guard     General bed mobility comments: Pt sat up EOB with cues  Transfers Overall transfer level: Needs assistance Equipment used: 2 person hand held assist Transfers: Sit to/from Stand Sit to Stand: Min assist;+2 physical assistance;From elevated surface         General transfer comment: Pt initially unsteady in standing but was able to regain his balance while  EOB  Ambulation/Gait Ambulation/Gait assistance: Min assist;+2 physical assistance Gait Distance (Feet): 60 Feet Assistive device: 2 person hand held assist Gait Pattern/deviations: Decreased stance time - right;Decreased dorsiflexion - right     General Gait Details: Pt agreed to walk in the room only - did 2 laps to door with HHA x2.  Pt with right leg instability and decreased DF - was afraid he would catch foot and loose balance.  Pt wouldnt compare today to how he was walking before.  pt was told to end up in the recliner and he sat a fast and uncontrolled sit in the recliner without turning all the way.  Pt wouldnt reposition once in teh chair.  We talked about using RW or cane for safety - pt said he wouldnt use AD.  Stairs            Wheelchair Mobility    Modified Rankin (Stroke Patients Only) Modified Rankin (Stroke Patients Only) Pre-Morbid Rankin Score: Moderate disability Modified Rankin: Moderately severe disability     Balance Overall balance assessment: Needs assistance   Sitting balance-Leahy Scale: Good       Standing balance-Leahy Scale: Poor Standing balance comment: dynamic standing balance not tested - pt would be more stable with RW support in static standing  Pertinent Vitals/Pain Pain Assessment: No/denies pain    Home Living Family/patient expects to be discharged to:: Private residence Living Arrangements: Alone               Additional Comments: pt unwilling to answer questions throughout session stating "I don't really feel like talking much now ma'am"    Prior Function           Comments: Pt wouldnt answer questions to AD and how right leg was doing prior to admit     Hand Dominance        Extremity/Trunk Assessment   Upper Extremity Assessment Upper Extremity Assessment: Defer to OT evaluation    Lower Extremity Assessment Lower Extremity Assessment: RLE deficits/detail RLE  Deficits / Details: right quad 3-/5, DF 2/5.  pt denied numbness in right leg    Cervical / Trunk Assessment Cervical / Trunk Assessment: Normal  Communication   Communication: No difficulties  Cognition Arousal/Alertness: Awake/alert Behavior During Therapy: Flat affect;Agitated Overall Cognitive Status: No family/caregiver present to determine baseline cognitive functioning                                 General Comments: Pt not talkative and answers questions inconsistently in one word answers.  Pt did comment that he hasnt been sleeping good - nothing to do with the hospital      General Comments      Exercises Other Exercises Other Exercises: I talked to pt about exercises to help right leg - long arc quads - focus on gettign knee straight.  pt did 3 reps Other Exercises: Pt educated on DF - and why this would help his walking - pt did 3 reps.   Assessment/Plan    PT Assessment Patient needs continued PT services  PT Problem List Decreased strength;Decreased mobility;Decreased safety awareness;Decreased knowledge of precautions;Decreased activity tolerance;Decreased cognition;Cardiopulmonary status limiting activity;Decreased balance;Decreased knowledge of use of DME       PT Treatment Interventions DME instruction;Therapeutic activities;Gait training;Therapeutic exercise;Patient/family education;Stair training;Balance training;Functional mobility training;Neuromuscular re-education    PT Goals (Current goals can be found in the Care Plan section)  Acute Rehab PT Goals Patient Stated Goal: Pt didnt state goal PT Goal Formulation: With patient Time For Goal Achievement: 11/03/19 Potential to Achieve Goals: Fair    Frequency Min 3X/week   Barriers to discharge Decreased caregiver support      Co-evaluation PT/OT/SLP Co-Evaluation/Treatment: Yes Reason for Co-Treatment: For patient/therapist safety;Necessary to address cognition/behavior during  functional activity PT goals addressed during session: Mobility/safety with mobility;Balance;Strengthening/ROM OT goals addressed during session: ADL's and self-care;Strengthening/ROM       AM-PAC PT "6 Clicks" Mobility  Outcome Measure Help needed turning from your back to your side while in a flat bed without using bedrails?: A Little Help needed moving from lying on your back to sitting on the side of a flat bed without using bedrails?: A Little Help needed moving to and from a bed to a chair (including a wheelchair)?: A Little Help needed standing up from a chair using your arms (e.g., wheelchair or bedside chair)?: A Little Help needed to walk in hospital room?: A Lot Help needed climbing 3-5 steps with a railing? : A Lot 6 Click Score: 16    End of Session Equipment Utilized During Treatment: Gait belt Activity Tolerance: Treatment limited secondary to agitation Patient left: in chair;with chair alarm set;with call bell/phone within reach Nurse Communication: Mobility status;Precautions  PT Visit Diagnosis: Unsteadiness on feet (R26.81);Other abnormalities of gait and mobility (R26.89);Muscle weakness (generalized) (M62.81);Hemiplegia and hemiparesis Hemiplegia - Right/Left: Right Hemiplegia - caused by: Cerebral infarction    Time: 1050-1107 PT Time Calculation (min) (ACUTE ONLY): 17 min   Charges:   PT Evaluation $PT Eval Low Complexity: 1 Low          10/20/2019   Rande Lawman, PT   Loyal Buba 10/20/2019, 12:07 PM

## 2019-10-20 NOTE — Evaluation (Signed)
Speech Language Pathology Evaluation Patient Details Name: Javier Rivera MRN: [id number] DOB: [birth year] Today's Date: 10/20/2019 Time: [id number] SLP Time Calculation (min) (ACUTE ONLY): 24 min  Problem List:  Patient Active Problem List   Diagnosis Date Noted  . CKD (chronic kidney disease), stage III 10/19/2019  . Hypertensive emergency 10/19/2019  . Hypertensive urgency 09/23/2019  . Leukopenia 09/23/2019  . FTT (failure to thrive) in adult 10/29/2016  . Right hip pain 10/29/2016  . Weakness 10/27/2016  . Fall 10/27/2016  . Thoracic aortic aneurysm without rupture ([place])   . Chest pain   . Slow transit constipation 11/25/2015  . Right sided weakness 10/17/2015  . Uncontrolled hypertension 10/17/2015  . CVA (cerebral vascular accident) ([place]) 10/17/2015  . Sensory disturbance 04/25/2015  . Ascending aorta dilatation (HCC)   . Protein-calorie malnutrition, severe ([place]) 04/24/2015  . Heart block AV second degree 04/23/2015  . Dizziness 04/23/2015  . Diabetes mellitus type 2, controlled ([place]) 04/23/2015  . Essential hypertension 04/23/2015  . Tobacco abuse 04/23/2015  . SEBORRHEIC DERMATITIS 05/07/2010  . RECTAL BLEEDING 08/14/2009  . Hyperlipidemia 06/11/2009  . Cocaine abuse ([place]) 06/11/2009  . Disorder resulting from impaired renal function 06/11/2009   Past Medical History:  Past Medical History:  Diagnosis Date  . Diabetes mellitus without complication ([place])   . Hypertension   . Renal disorder    pt stated that masses were found on his kidneys  . Renal insufficiency   . Stroke [facility])    r sided weakness, memory   Past Surgical History:  Past Surgical History:  Procedure Laterality Date  . KNEE SURGERY     bilateral   HPI:  Javier Rivera, 59y/m presented to [facility] as code stroke for worsening right side weakness and right facial droop. CT and MRI negative for hemorrhage or stroke. PMH of CVA (right side weakness, memory deficits), HTN, DM.   Assessment / Plan /  Recommendation Clinical Impression  Cognitive language assessment completed at bedside. Patient presents with cognitive and linguistic skills within normal limits. He converses at the conversational level. Patient complaining he hasn't slept in days and he is tired. No recommendations for ST at this time.     SLP Assessment  SLP Recommendation/Assessment: Patient does not need any further Speech Lanaguage Pathology Services SLP Visit Diagnosis: Cognitive communication deficit (R41.841)                     SLP Evaluation Cognition  Overall Cognitive Status: No family/caregiver present to determine baseline cognitive functioning Arousal/Alertness: Awake/alert Orientation Level: Oriented X4 Attention: Focused;Sustained;Selective Focused Attention: Appears intact Sustained Attention: Appears intact Selective Attention: Appears intact Memory: Appears intact Immediate Memory Recall: Sock;Blue;Bed Memory Recall Sock: Without Cue Memory Recall Blue: Without Cue Memory Recall Bed: Without Cue Awareness: Appears intact Problem Solving: Appears intact Executive Function: Reasoning Reasoning: Appears intact Safety/Judgment: Appears intact       Comprehension  Auditory Comprehension Overall Auditory Comprehension: Appears within functional limits for tasks assessed Yes/No Questions: Within Functional Limits Commands: Within Functional Limits Conversation: Complex Visual Recognition/Discrimination Discrimination: Not tested Reading Comprehension Reading Status: Within funtional limits    Expression Expression Primary Mode of Expression: Verbal Verbal Expression Overall Verbal Expression: Appears within functional limits for tasks assessed Initiation: No impairment Level of Generative/Spontaneous Verbalization: Financial controller Expression Dominant Hand: Right   Oral / Motor  Oral Motor/Sensory Function Overall Oral Motor/Sensory Function: Within functional limits Motor  Speech Overall Motor Speech: Appears within functional limits for tasks assessed Respiration: Within  functional limits Phonation: Normal Resonance: Within functional limits Articulation: Within functional limitis Intelligibility: Intelligible Motor Planning: Witnin functional limits Motor Speech Errors: Not applicable   GO                    Wynelle Bourgeois., MA CCC-SLP 10/20/2019, 12:33 PM

## 2019-10-20 NOTE — Plan of Care (Signed)

## 2019-10-20 NOTE — Progress Notes (Signed)
EKG CRITICAL VALUE     12 lead EKG performed.  Critical value noted.  Pilar Grammes, RN notified.   Genia Plants, CCT 10/20/2019 9:04 AM

## 2019-10-20 NOTE — Progress Notes (Signed)
PROGRESS NOTE    Javier Rivera  [id number] DOB: 09-17-1960 DOA: 10/18/2019 PCP: Patient, No Pcp Per    Brief Narrative:  Patient was admitted to the hospital with hypertensive encephalopathy, hypertensive emergency. Hospitalization complicated with bradycardia.   59 year old male who presented with right-sided weakness.  He does have significant past medical history for hypertension and CVA with chronic right-sided weakness since 2017.  He also has type 2 diabetes mellitus, chronic kidney disease, polysubstance abuse and history of abdominal aortic aneurysm.  Patient reported worsening right-sided weakness and right facial droop.  Apparently he went to sleep at his usual state of health, he woke up around 7 AM with the above-mentioned neurologic deficits.  On his initial physical examination his blood pressure was 161/108, heart rate 58, respiratory rate 14, oxygen saturation 97%, his lungs were clear to auscultation bilaterally, heart S1-S2 present rhythmic, soft abdomen and no lower extremity edema.  Right-sided weakness 4 out of 5, no significant facial droop. Sodium 140, potassium 3.6, chloride 105, BUN 12, creatinine 1.28, white count 3.8, hemoglobin 7.3, hematocrit 52.9, platelets 176.  SARS COVID-19 was negative.  Urinalysis specific gravity more than 1.046, 0-5 white cells, 0-5 red cells.  Urine drug screen negative, alcohol less than 10.Marland Kitchen  Head CT negative for acute changes.  EKG 60 bpm, left axis deviation, left anterior fascicular block, first-degree AV block, sinus rhythm, no significant ST segment T wave changes.  Blood pressure has improved with oral antihypertensives. Patient has showed bradycardia on the telemetry monitor, with sinus exit blocks.    Assessment & Plan:   Principal Problem:   Hypertensive emergency Active Problems:   Hyperlipidemia   Diabetes mellitus type 2, controlled ([place])   Ascending aorta dilatation (HCC)   CVA (cerebral vascular accident) ([place])  CKD (chronic kidney disease), stage III   1. Hypertensive emergency. This am blood pressure not controlled, up to 141/106. Patient continue to feel very weak and deconditioned.  Will continue hctz and will add 5 mg of amlodipine. Continue close monitoring of blood pressure.   2. Bradycardia. Echocardiogram from 02/21, with preserved LV systolic function with hypertrophy at the basal septal segment. 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  Personally reviewed telemetry and ekg, patient with sinus exit block, combined with left anterior fascicular block and 1st degree AV block. His HR has been in the low 60's. Carvedilol has been discontinued.   Will continue telemetry monitoring for now, off B blockade. Patient may need outpatient cardiac monitoring.  2. Hx of ischemic CVA with right sided weakness. Persistent right sided weakness. CT angiography head with no significant stenosis. Brain MRI with no acute CVA, but severe chronic small ischemic changes.   Continue with aspirin, satin and blood pressure control. Patient has been recommended to continue care at SNF per PT and OT. Will consult TOC.   3. T2DM Hgb A1c 6,9) / dyslipidemia. Glucose cover and monitoring with insulin sliding scale. Patient continue tolerating po well.   On atorvastatin.   4. CKD stage 2. Stable renal function with serum cr at 1,2 to 1,3  with K at 3,8 and serum bicarbonate at 25. Continue close follow up of renal function and electrolytes.      DVT prophylaxis: heparin   Code Status: full Family Communication: no family at the bedside  Disposition Plan/ discharge barriers: patient from home, dc barrier persistent worsening focal neurologic deficit, uncontrolled hypertension and bradycardia, plan for SNF.    Subjective: Patient continue to feel very weak and deconditioned, positive  constipation, no nausea or vomiting, this am is out of bed to chair. Positive bradycardia on telemetry monitor.    Objective: Vitals:   10/20/19 0415 10/20/19 0739 10/20/19 1112 10/20/19 1118  BP: (!) 141/81 (!) 146/86 (!) 147/108 (!) 141/106  Pulse: (!) 50 62 70   Resp: 16 17 18    Temp: 98.6 F (37 C) 98.1 F (36.7 C) 97.7 F (36.5 C)   TempSrc: Oral Oral Oral   SpO2: 99% 100% 100%     Intake/Output Summary (Last 24 hours) at 10/20/2019 1135 Last data filed at 10/20/2019 [id number] Gross per 24 hour  Intake --  Output 1100 ml  Net -1100 ml   There were no vitals filed for this visit.  Examination:   General: Not in pain or dyspnea, deconditioned  Neurology: Awake and alert, non focal. Right sided weakness 4/5.  E ENT: no pallor, no icterus, oral mucosa moist Cardiovascular: No JVD. S1-S2 present, rhythmic, no gallops, rubs, or murmurs. No lower extremity edema. Pulmonary: positive breath sounds bilaterally, adequate air movement, no wheezing, rhonchi or rales. Gastrointestinal. Abdomen with no organomegaly, non tender, no rebound or guarding Skin. No rashes Musculoskeletal: no joint deformities     Data Reviewed: I have personally reviewed following labs and imaging studies  CBC: Recent Labs  Lab 10/18/19 1158 10/18/19 1200  WBC 3.8*  --   NEUTROABS 2.1  --   HGB 17.3* 17.7*  HCT 52.9* 52.0  MCV 88.9  --   PLT 176  --    Basic Metabolic Panel: Recent Labs  Lab 10/18/19 1158 10/18/19 1200 10/20/19 0440  NA 139 140 136  K 3.8 3.6 3.8  CL 108 105 103  CO2 21*  --  25  GLUCOSE 108* 104* 117*  BUN 10 12 14   CREATININE 1.28* 1.20 1.35*  CALCIUM 8.6*  --  8.7*  MG  --   --  2.0   GFR: CrCl cannot be calculated (Unknown ideal weight.). Liver Function Tests: Recent Labs  Lab 10/18/19 1158  AST 22  ALT 32  ALKPHOS 61  BILITOT 1.2  PROT 7.3  ALBUMIN 3.9   No results for input(s): LIPASE, AMYLASE in the last 168 hours. No results for input(s): AMMONIA in the last 168 hours. Coagulation Profile: Recent Labs  Lab 10/18/19 1158  INR 1.0   Cardiac Enzymes: No  results for input(s): CKTOTAL, CKMB, CKMBINDEX, TROPONINI in the last 168 hours. BNP (last 3 results) No results for input(s): PROBNP in the last 8760 hours. HbA1C: Recent Labs    10/19/19 0424  HGBA1C 6.9*   CBG: Recent Labs  Lab 10/19/19 1214 10/19/19 1614 10/19/19 2112 10/20/19 0613 10/20/19 1129  GLUCAP 142* 111* 122* 98 141*   Lipid Profile: Recent Labs    10/19/19 0424  CHOL 164  HDL 35*  LDLCALC 118*  TRIG 54  CHOLHDL 4.7   Thyroid Function Tests: No results for input(s): TSH, T4TOTAL, FREET4, T3FREE, THYROIDAB in the last 72 hours. Anemia Panel: No results for input(s): VITAMINB12, FOLATE, FERRITIN, TIBC, IRON, RETICCTPCT in the last 72 hours.    Radiology Studies: I have reviewed all of the imaging during this hospital visit personally     Scheduled Meds: . amLODipine  5 mg Oral Daily  . aspirin EC  81 mg Oral Daily  . atorvastatin  80 mg Oral q1800  . heparin  5,000 Units Subcutaneous Q8H  . hydrochlorothiazide  12.5 mg Oral Daily  . insulin aspart  0-9 Units Subcutaneous TID  WC  . polyethylene glycol  17 g Oral BID   Continuous Infusions:   LOS: 2 days        Jaleeya Mcnelly Gerome Apley, MD

## 2019-10-21 DIAGNOSIS — I10 Essential (primary) hypertension: Secondary | ICD-10-CM

## 2019-10-21 DIAGNOSIS — E1122 Type 2 diabetes mellitus with diabetic chronic kidney disease: Secondary | ICD-10-CM

## 2019-10-21 DIAGNOSIS — N182 Chronic kidney disease, stage 2 (mild): Secondary | ICD-10-CM

## 2019-10-21 LAB — GLUCOSE, CAPILLARY
Glucose-Capillary: 172 mg/dL — ABNORMAL HIGH (ref 70–99)
Glucose-Capillary: 105 mg/dL — ABNORMAL HIGH (ref 70–99)
Glucose-Capillary: 162 mg/dL — ABNORMAL HIGH (ref 70–99)
Glucose-Capillary: 157 mg/dL — ABNORMAL HIGH (ref 70–99)
Glucose-Capillary: 151 mg/dL — ABNORMAL HIGH (ref 70–99)

## 2019-10-21 LAB — BASIC METABOLIC PANEL
Anion gap: 10 (ref 5–15)
BUN: 13 mg/dL (ref 6–20)
CO2: 25 mmol/L (ref 22–32)
Calcium: 8.8 mg/dL — ABNORMAL LOW (ref 8.9–10.3)
Chloride: 102 mmol/L (ref 98–111)
Creatinine, Ser: 1.38 mg/dL — ABNORMAL HIGH (ref 0.61–1.24)
GFR calc Af Amer: >60 mL/min (ref >60)
GFR calc non Af Amer: 56 mL/min — ABNORMAL LOW (ref >60)
Glucose, Bld: 114 mg/dL — ABNORMAL HIGH (ref 70–99)
Potassium: 4.1 mmol/L (ref 3.5–5.1)
Sodium: 137 mmol/L (ref 135–145)

## 2019-10-21 MED ORDER — HYDROCHLOROTHIAZIDE 25 MG PO TABS
25.0000 mg | ORAL_TABLET | Freq: Every day | ORAL | Status: DC
  Administered 2019-10-22 – 2019-10-24 (×3): 25 mg via ORAL
  Filled 2019-10-21 (×3): qty 1

## 2019-10-21 MED ORDER — LOSARTAN POTASSIUM 50 MG PO TABS
50.0000 mg | ORAL_TABLET | Freq: Every day | ORAL | Status: DC
  Administered 2019-10-22 – 2019-10-24 (×3): 50 mg via ORAL
  Filled 2019-10-21 (×5): qty 1

## 2019-10-21 MED ORDER — HYDRALAZINE HCL 50 MG PO TABS: 50.0000 mg | ORAL_TABLET | Freq: Two times a day (BID) | ORAL | Status: DC

## 2019-10-21 MED ORDER — AMLODIPINE BESYLATE 10 MG PO TABS: 10.0000 mg | ORAL_TABLET | Freq: Every day | ORAL | Status: DC

## 2019-10-21 MED ORDER — AMLODIPINE BESYLATE 10 MG PO TABS
10.0000 mg | ORAL_TABLET | Freq: Every day | ORAL | Status: DC
  Administered 2019-10-21 – 2019-10-24 (×4): 10 mg via ORAL
  Filled 2019-10-21 (×4): qty 1

## 2019-10-21 NOTE — TOC Initial Note (Signed)
Transition of Care [facility]) - Initial/Assessment Note    Patient Details  Name: Javier Rivera MRN: [id number] Date of Birth: 1961/01/01  [facility]) CM/SW Contact:    Emeterio Reeve, [place] Phone [phone] 10/21/2019, 3:20 PM  Clinical Narrative:                 CSW met with pt bedside. CSW introduced self and explained her role. Pt is from home alone. Pt stated he has little family in the area. Pt stated he was independent PTA. Pt states he has a walker at home from last admission but does not use it.   PT/ OT reccommended SNF placements. CSW and pt discussed SNFs. Pt stated he did not have a preference of facilities. Pt declined having CSW call family. CSW will update pt about options for placements.  Expected Discharge Plan: Skilled Nursing Facility Barriers to Discharge: Continued Medical Work up   Patient Goals and CMS Choice Patient states their goals for this hospitalization and ongoing recovery are:: to go back home      Expected Discharge Plan and Services Expected Discharge Plan: Villas arrangements for the past 2 months: Apartment                                      Prior Living Arrangements/Services Living arrangements for the past 2 months: Apartment Lives with:: Self Patient language and need for interpreter reviewed:: Yes Do you feel safe going back to the place where you live?: Yes      Need for Family Participation in Patient Care: Yes (Comment) Care giver support system in place?: No (comment)   Criminal Activity/Legal Involvement Pertinent to Current Situation/Hospitalization: No - Comment as needed  Activities of Daily Living Home Assistive Devices/Equipment: Cane (specify quad or straight), Walker (specify type) ADL Screening (condition at time of admission) Patient's cognitive ability adequate to safely complete daily activities?: Yes Is the patient deaf or have difficulty hearing?:  No Does the patient have difficulty seeing, even when wearing glasses/contacts?: No Does the patient have difficulty concentrating, remembering, or making decisions?: No Patient able to express need for assistance with ADLs?: Yes Does the patient have difficulty dressing or bathing?: No Independently performs ADLs?: No Communication: Independent Dressing (OT): Independent Grooming: Independent Feeding: Independent Bathing: Independent Toileting: Independent In/Out Bed: Independent Walks in Home: Independent Does the patient have difficulty walking or climbing stairs?: Yes Weakness of Legs: Right Weakness of Arms/Hands: Right  Permission Sought/Granted                  Emotional Assessment Appearance:: Appears stated age Attitude/Demeanor/Rapport: Engaged Affect (typically observed): Appropriate Orientation: : Oriented to Self, Oriented to Place, Oriented to  Time, Oriented to Situation      Admission diagnosis:  Weakness [R53.1] CVA (cerebral vascular accident) [facility]) [I63.9] Patient Active Problem List   Diagnosis Date Noted  . CKD stage 2 due to type 2 diabetes mellitus ([place]) 10/21/2019  . CKD (chronic kidney disease), stage III 10/19/2019  . Hypertensive emergency 10/19/2019  . Hypertensive urgency 09/23/2019  . Leukopenia 09/23/2019  . FTT (failure to thrive) in adult 10/29/2016  . Right hip pain 10/29/2016  . Weakness 10/27/2016  . Fall 10/27/2016  . Thoracic aortic aneurysm without rupture ([place])   . Chest pain   . Slow transit constipation 11/25/2015  . Right sided weakness  10/17/2015  . Uncontrolled hypertension 10/17/2015  . CVA (cerebral vascular accident) ([place]) 10/17/2015  . Sensory disturbance 04/25/2015  . Ascending aorta dilatation (HCC)   . Protein-calorie malnutrition, severe ([place]) 04/24/2015  . Heart block AV second degree 04/23/2015  . Dizziness 04/23/2015  . Diabetes mellitus type 2, controlled ([place]) 04/23/2015  . Essential hypertension  04/23/2015  . Tobacco abuse 04/23/2015  . SEBORRHEIC DERMATITIS 05/07/2010  . RECTAL BLEEDING 08/14/2009  . Hyperlipidemia 06/11/2009  . Cocaine abuse ([place]) 06/11/2009  . Disorder resulting from impaired renal function 06/11/2009   PCP:  Patient, No Pcp Per Pharmacy:   [facility] [place], [place] [address] [place] [address] Phone: [phone] Fax: [phone]     Social Determinants of Health ([place]) Interventions    Readmission Risk Interventions No flowsheet data found.  Emeterio Reeve, Latanya Presser, [place] Licensed Clinical Social Worker [phone]

## 2019-10-21 NOTE — Progress Notes (Signed)
Patient has refused meds after multiple attempts this afternoon. Explained to patient that new BP meds were ordered by MD and that they should assist with furth reduction in HTN. However. Patient became angry with staff regarding the timing of BP readings. Became verbally abusive with staff and again refused meds for the afternoon.

## 2019-10-21 NOTE — NC FL2 (Signed)
[place] LEVEL OF CARE SCREENING TOOL     IDENTIFICATION  Patient Name: Javier Rivera Birthdate: 10-10-1960 Sex: male Admission Date (Current Location): 10/18/2019  [facility] and [place] Number:  Herbalist and Address:  [facility], [place] [address], [place], [place] [address]      Provider Number: [id number]  Attending Physician Name and Address:  Tawni Millers  Relative Name and Phone Number:       Current Level of Care: Hospital Recommended Level of Care: [place] Prior Approval Number:    Date Approved/Denied:   PASRR Number: [id number]  Discharge Plan: SNF    Current Diagnoses: Patient Active Problem List   Diagnosis Date Noted  . CKD stage 2 due to type 2 diabetes mellitus ([place]) 10/21/2019  . CKD (chronic kidney disease), stage III 10/19/2019  . Hypertensive emergency 10/19/2019  . Hypertensive urgency 09/23/2019  . Leukopenia 09/23/2019  . FTT (failure to thrive) in adult 10/29/2016  . Right hip pain 10/29/2016  . Weakness 10/27/2016  . Fall 10/27/2016  . Thoracic aortic aneurysm without rupture ([place])   . Chest pain   . Slow transit constipation 11/25/2015  . Right sided weakness 10/17/2015  . Uncontrolled hypertension 10/17/2015  . CVA (cerebral vascular accident) ([place]) 10/17/2015  . Sensory disturbance 04/25/2015  . Ascending aorta dilatation (HCC)   . Protein-calorie malnutrition, severe ([place]) 04/24/2015  . Heart block AV second degree 04/23/2015  . Dizziness 04/23/2015  . Diabetes mellitus type 2, controlled ([place]) 04/23/2015  . Essential hypertension 04/23/2015  . Tobacco abuse 04/23/2015  . SEBORRHEIC DERMATITIS 05/07/2010  . RECTAL BLEEDING 08/14/2009  . Hyperlipidemia 06/11/2009  . Cocaine abuse ([place]) 06/11/2009  . Disorder resulting from impaired renal function 06/11/2009    Orientation RESPIRATION BLADDER Height & Weight     Self, Time, Situation  Normal  Incontinent Weight:   Height:     BEHAVIORAL SYMPTOMS/MOOD NEUROLOGICAL BOWEL NUTRITION STATUS      Incontinent Diet(See discharge summery)  AMBULATORY STATUS COMMUNICATION OF NEEDS Skin   Limited Assist Verbally Normal                       Personal Care Assistance Level of Assistance  Bathing, Feeding, Dressing Bathing Assistance: Limited assistance Feeding assistance: Independent Dressing Assistance: Limited assistance     Functional Limitations Info             SPECIAL CARE FACTORS FREQUENCY  PT (By licensed PT), OT (By licensed OT)     PT Frequency: 5x a week OT Frequency: 5x a week            Contractures Contractures Info: Not present    Additional Factors Info  Code Status, Allergies, Insulin Sliding Scale Code Status Info: FULL Allergies Info: Clonidine Derivatives, Lisinopril   Insulin Sliding Scale Info: Novolog 0-9 units 3x a day       Current Medications (10/21/2019):  This is the current hospital active medication list Current Facility-Administered Medications  Medication Dose Route Frequency Provider Last Rate Last Admin  . acetaminophen (TYLENOL) tablet 650 mg  650 mg Oral Q4H PRN Wynetta Fines T, MD       Or  . acetaminophen (TYLENOL) 160 MG/5ML solution 650 mg  650 mg Per Tube Q4H PRN Wynetta Fines T, MD       Or  . acetaminophen (TYLENOL) suppository 650 mg  650 mg Rectal Q4H PRN Lequita Halt, MD      .  amLODipine (NORVASC) tablet 10 mg  10 mg Oral Daily Arrien, Jimmy Picket, MD   10 mg at 10/21/19 0901  . aspirin EC tablet 81 mg  81 mg Oral Daily Rosalin Hawking, MD   81 mg at 10/21/19 0901  . atorvastatin (LIPITOR) tablet 80 mg  80 mg Oral q1800 Rinehuls, David L, PA-C   80 mg at 10/20/19 1657  . heparin injection 5,000 Units  5,000 Units Subcutaneous Q8H Lequita Halt, MD   5,000 Units at 10/21/19 1601  . [START ON 10/22/2019] hydrochlorothiazide (HYDRODIURIL) tablet 25 mg  25 mg Oral Daily Arrien, Jimmy Picket, MD      . insulin aspart  (novoLOG) injection 0-9 Units  0-9 Units Subcutaneous TID WC Lequita Halt, MD   1 Units at 10/20/19 1159  . labetalol (NORMODYNE) injection 10 mg  10 mg Intravenous Q4H PRN Wynetta Fines T, MD   10 mg at 10/18/19 1837  . losartan (COZAAR) tablet 50 mg  50 mg Oral Daily Arrien, Jimmy Picket, MD   50 mg at 10/21/19 1601  . polyethylene glycol (MIRALAX / GLYCOLAX) packet 17 g  17 g Oral BID Tawni Millers, MD   17 g at 10/21/19 0901  . senna-docusate (Senokot-S) tablet 1 tablet  1 tablet Oral QHS PRN Lequita Halt, MD   1 tablet at 10/20/19 [id number]     Discharge Medications: Please see discharge summary for a list of discharge medications.  Relevant Imaging Results:  Relevant Lab Results:   Additional Information SSN [id number]  Emeterio Reeve, [place]

## 2019-10-21 NOTE — Progress Notes (Addendum)
PROGRESS NOTE    Javier Rivera  [id number] DOB: 16-May-1961 DOA: 10/18/2019 PCP: Patient, No Pcp Per    Brief Narrative:  Patient was admitted to the hospital with hypertensive encephalopathy, hypertensive emergency. Hospitalization complicated with bradycardia.   59 year old male who presented with right-sided weakness. He does have significant past medical history for hypertension and CVA with chronic right-sided weakness since 2017. He also has type 2 diabetes mellitus, chronic kidney disease, polysubstance abuse and history of abdominal aortic aneurysm. Patient reported worsening right-sided weakness and right facial droop. Apparently he went to sleep at his usual state of health, he woke up around 7 AM with the above-mentioned neurologic deficits. On his initial physical examination his blood pressure was 161/108, heart rate 58, respiratory rate 14, oxygen saturation 97%,his lungs were clear to auscultation bilaterally, heart S1-S2 present rhythmic, soft abdomen and no lower extremity edema. Right-sided weakness 4 out of 5, no significant facial droop. Sodium 140, potassium 3.6, chloride 105, BUN 12, creatinine 1.28,white count 3.8, hemoglobin 7.3, hematocrit 52.9, platelets 176. SARS COVID-19 was negative. Urinalysis specific gravity more than 1.046, 0-5 white cells, 0-5 red cells. Urine drug screen negative, alcohol less than 10.Marland Kitchen Head CT negative for acute changes. EKG 60 bpm, left axis deviation, left anterior fascicular block, first-degree AV block, sinusrhythm, no significant ST segment T wave changes.  Blood pressure has improved with oral antihypertensives.Patient has showed bradycardia on the telemetry monitor, with sinus exit blocks.   Patient continue to be very weak and deconditioned, PT/OT recommended SNF.   Assessment & Plan:   Principal Problem:   Hypertensive emergency Active Problems:   Hyperlipidemia   Diabetes mellitus type 2, controlled ([place])  Essential hypertension   Ascending aorta dilatation (HCC)   CVA (cerebral vascular accident) ([place])   CKD stage 2 due to type 2 diabetes mellitus ([place])   1. Hypertensive emergency. Persistent uncontrolled HTN 191/101. Persistent weakness and deconditioning, no chest pain.  Increase amlodipine to 10 mg, will resume losartan 50 mg daily, and increase HCTZ to 25 mg daily. Patient had adverse reaction to lisinopril in the past.   2. Bradycardia, type 1 and 2 second degree AV block. Echocardiogram from 02/21, with preserved LV systolic function with hypertrophy at the basal septal segment. 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 Personally reviewed telemetry, patient having 2nd degree AV block type one and two, predominately at night. His HR has been stable at 60 bpm. Now off carvedilol.   Will continue telemetry monitoring, avoid AV blockade, patient will benefit from outpatient cardiac monitoring, will refer to cardiology.   2. Hx of ischemic CVA with right sided weakness. Persistent right sided weakness. CT angiography head with no significant stenosis. Brain MRI with no acute CVA, but severe chronic small ischemic changes.   Medical therapy with aspirin and statin, needs better blood pressure control.   Patient agrees with SNF.   3. T2DM (Hgb A1c 6,9) / dyslipidemia. Continue with insulin sliding scale for glucose cover and monitoring.   Continue with atorvastatin.   4. CKD stage 2. Renal function stable with serum cr at 1,38  with K at 4.1 and serum bicarbonate at 25. Will increase hctz to 25 mg daily.    DVT prophylaxis:heparin Code Status:full Family Communication:no family at the bedside Disposition Plan/ discharge barriers:patient from home, dc barrier persistent worsening focal neurologic deficit, uncontrolled hypertension and bradycardia, plan for SNF.      Subjective: Patient continue to feel very weak and deconditioned, no nausea or vomiting, blood pressure  continue to be elevated. Had bradyarrhythmias at night.   Objective: Vitals:   10/20/19 2020 10/21/19 0025 10/21/19 0402 10/21/19 0403  BP: (!) 136/99 (!) 147/99 (!) 156/101 (!) 160/101  Pulse: 63 66 60   Resp: 18 18 18    Temp: 98.9 F (37.2 C) 98.6 F (37 C) 98.8 F (37.1 C)   TempSrc: Oral Oral Oral   SpO2: 96% 100% 98%     Intake/Output Summary (Last 24 hours) at 10/21/2019 0826 Last data filed at 10/21/2019 0405 Gross per 24 hour  Intake 480 ml  Output 675 ml  Net -195 ml   There were no vitals filed for this visit.  Examination:   General: Not in pain or dyspnea, deconditioned  Neurology: Awake and alert, right sided weakness 4/5.  E ENT: no pallor, no icterus, oral mucosa moist Cardiovascular: No JVD. S1-S2 present, rhythmic, no gallops, rubs, or murmurs. No lower extremity edema. Pulmonary: positive breath sounds bilaterally, adequate air movement, no wheezing, rhonchi or rales. Gastrointestinal. Abdomen flat, no organomegaly, non tender, no rebound or guarding Skin. No rashes Musculoskeletal: no joint deformities     Data Reviewed: I have personally reviewed following labs and imaging studies  CBC: Recent Labs  Lab 10/18/19 1158 10/18/19 1200  WBC 3.8*  --   NEUTROABS 2.1  --   HGB 17.3* 17.7*  HCT 52.9* 52.0  MCV 88.9  --   PLT 176  --    Basic Metabolic Panel: Recent Labs  Lab 10/18/19 1158 10/18/19 1200 10/20/19 0440 10/21/19 0434  NA 139 140 136 137  K 3.8 3.6 3.8 4.1  CL 108 105 103 102  CO2 21*  --  25 25  GLUCOSE 108* 104* 117* 114*  BUN 10 12 14 13   CREATININE 1.28* 1.20 1.35* 1.38*  CALCIUM 8.6*  --  8.7* 8.8*  MG  --   --  2.0  --    GFR: CrCl cannot be calculated (Unknown ideal weight.). Liver Function Tests: Recent Labs  Lab 10/18/19 1158  AST 22  ALT 32  ALKPHOS 61  BILITOT 1.2  PROT 7.3  ALBUMIN 3.9   No results for input(s): LIPASE, AMYLASE in the last 168 hours. No results for input(s): AMMONIA in the last 168  hours. Coagulation Profile: Recent Labs  Lab 10/18/19 1158  INR 1.0   Cardiac Enzymes: No results for input(s): CKTOTAL, CKMB, CKMBINDEX, TROPONINI in the last 168 hours. BNP (last 3 results) No results for input(s): PROBNP in the last 8760 hours. HbA1C: Recent Labs    10/19/19 0424  HGBA1C 6.9*   CBG: Recent Labs  Lab 10/20/19 0613 10/20/19 1129 10/20/19 1630 10/20/19 2149 10/21/19 0616  GLUCAP 98 141* 103* 114* 105*   Lipid Profile: Recent Labs    10/19/19 0424  CHOL 164  HDL 35*  LDLCALC 118*  TRIG 54  CHOLHDL 4.7   Thyroid Function Tests: No results for input(s): TSH, T4TOTAL, FREET4, T3FREE, THYROIDAB in the last 72 hours. Anemia Panel: No results for input(s): VITAMINB12, FOLATE, FERRITIN, TIBC, IRON, RETICCTPCT in the last 72 hours.    Radiology Studies: I have reviewed all of the imaging during this hospital visit personally     Scheduled Meds: . amLODipine  10 mg Oral Daily  . aspirin EC  81 mg Oral Daily  . atorvastatin  80 mg Oral q1800  . heparin  5,000 Units Subcutaneous Q8H  . hydrochlorothiazide  12.5 mg Oral Daily  . insulin aspart  0-9 Units Subcutaneous TID  WC  . polyethylene glycol  17 g Oral BID   Continuous Infusions:   LOS: 3 days        Judith Campillo Gerome Apley, MD

## 2019-10-22 DIAGNOSIS — E1159 Type 2 diabetes mellitus with other circulatory complications: Secondary | ICD-10-CM

## 2019-10-22 LAB — GLUCOSE, CAPILLARY
Glucose-Capillary: 91 mg/dL (ref 70–99)
Glucose-Capillary: 128 mg/dL — ABNORMAL HIGH (ref 70–99)
Glucose-Capillary: 123 mg/dL — ABNORMAL HIGH (ref 70–99)
Glucose-Capillary: 119 mg/dL — ABNORMAL HIGH (ref 70–99)

## 2019-10-22 MED ORDER — HYDRALAZINE HCL 50 MG PO TABS
50.0000 mg | ORAL_TABLET | Freq: Two times a day (BID) | ORAL | Status: DC
  Administered 2019-10-22 – 2019-10-23 (×4): 50 mg via ORAL
  Filled 2019-10-22 (×4): qty 1

## 2019-10-22 MED ORDER — HYDRALAZINE HCL 50 MG PO TABS: 50.0000 mg | ORAL_TABLET | Freq: Two times a day (BID) | ORAL | Status: DC

## 2019-10-22 NOTE — Progress Notes (Signed)
Physical Therapy Treatment Patient Details Name: Javier Rivera MRN: [id number] DOB: [birth year] Today's Date: 10/22/2019    History of Present Illness Javier Rivera is an 59 y.o. male  With PMH CVA (right side weakness, memory deficits), HTN, DM who presented to [facility] ED as a code stroke for worsening right side weakness  and right facial droop. CT head negative for bleeding, CTA no large vessel occlusion. MRI negative for stroke.     PT Comments    Pt supine in bed, he once again reluctantly participated in session this pm.  He remains flat and becomes agitated with assistance or cues to correct mobility.  Will f/u to see if patient is more compliant.  His gt remains unsteady so continue to recommend rehab at [facility].  Unsure of living situation.     Follow Up Recommendations  SNF;Supervision/Assistance - 24 hour     Equipment Recommendations  Rolling walker with 5" wheels(Tall height)    Recommendations for Other Services       Precautions / Restrictions Precautions Precautions: Fall Precaution Comments: right leg weakness Restrictions Weight Bearing Restrictions: No Other Position/Activity Restrictions: pt denies dizziness    Mobility  Bed Mobility Overal bed mobility: Needs Assistance Bed Mobility: Supine to Sit     Supine to sit: Modified independent (Device/Increase time)     General bed mobility comments: Pt able to come to edge of bed unassisted.  Transfers Overall transfer level: Needs assistance Equipment used: None Transfers: Sit to/from Stand Sit to Stand: Min guard         General transfer comment: Min guard cues for safety.  Ambulation/Gait Ambulation/Gait assistance: Min guard;Supervision Gait Distance (Feet): 60 Feet Assistive device: None Gait Pattern/deviations: Decreased stance time - right;Decreased dorsiflexion - right     General Gait Details: Pt continues to present with R sided weakness and minor instability.  He is not receptive to cues  given to correct deficits.  Pt noted with furniture ambulation grabbing door frames and objects in room to hold to.  He continues to refuse to use device.  Pt wondering aimlessly around his room.   Stairs             Wheelchair Mobility    Modified Rankin (Stroke Patients Only) Modified Rankin (Stroke Patients Only) Pre-Morbid Rankin Score: Moderate disability Modified Rankin: Moderately severe disability     Balance Overall balance assessment: Needs assistance   Sitting balance-Leahy Scale: Good       Standing balance-Leahy Scale: Poor                              Cognition Arousal/Alertness: Awake/alert Behavior During Therapy: Flat affect;Agitated Overall Cognitive Status: No family/caregiver present to determine baseline cognitive functioning                                 General Comments: Pt remains to be minimally conversive during session.  He would not answer questions about PLOF or living arrangements.      Exercises      General Comments        Pertinent Vitals/Pain Pain Assessment: (He reports pain but did not rate how much or where and he does not appear to be in pain from clinical observation.)    Home Living  Prior Function            PT Goals (current goals can now be found in the care plan section) Acute Rehab PT Goals Patient Stated Goal: Pt didnt state goal Potential to Achieve Goals: Fair Progress towards PT goals: Progressing toward goals    Frequency    Min 3X/week      PT Plan Current plan remains appropriate    Co-evaluation              AM-PAC PT "6 Clicks" Mobility   Outcome Measure  Help needed turning from your back to your side while in a flat bed without using bedrails?: A Little Help needed moving from lying on your back to sitting on the side of a flat bed without using bedrails?: A Little Help needed moving to and from a bed to a chair (including  a wheelchair)?: A Little Help needed standing up from a chair using your arms (e.g., wheelchair or bedside chair)?: A Little Help needed to walk in hospital room?: A Little Help needed climbing 3-5 steps with a railing? : A Little 6 Click Score: 18    End of Session Equipment Utilized During Treatment: Gait belt Activity Tolerance: Treatment limited secondary to agitation Patient left: in bed;with bed alarm set(sitting edge of bed.) Nurse Communication: Mobility status;Precautions PT Visit Diagnosis: Unsteadiness on feet (R26.81);Other abnormalities of gait and mobility (R26.89);Muscle weakness (generalized) (M62.81);Hemiplegia and hemiparesis Hemiplegia - Right/Left: Right Hemiplegia - caused by: Cerebral infarction     Time: [id number] PT Time Calculation (min) (ACUTE ONLY): 29 min  Charges:  $Gait Training: 8-22 mins $Therapeutic Activity: 8-22 mins                     Erasmo Leventhal , PTA Acute Rehabilitation Services Pager [phone] Office [phone]     Megham Dwyer Eli Hose 10/22/2019, 3:02 PM

## 2019-10-22 NOTE — Progress Notes (Signed)
PROGRESS NOTE    Javier Rivera  [id number] DOB: [birth year] DOA: 10/18/2019 PCP: Patient, No Pcp Per    Brief Narrative:  Patient was admitted to the hospital with hypertensive encephalopathy, hypertensive emergency.Hospitalization complicated with bradycardia.  59 year old male who presented with right-sided weakness. He does have significant past medical history for hypertension and CVA with chronic right-sided weakness since 2017. He also has type 2 diabetes mellitus, chronic kidney disease, polysubstance abuse and history of abdominal aortic aneurysm. Patient reported worsening right-sided weakness and right facial droop. Apparently he went to sleep at his usual state of health, he woke up around 7 AM with the above-mentioned neurologic deficits. On his initial physical examination his blood pressure was 161/108, heart rate 58, respiratory rate 14, oxygen saturation 97%,his lungs were clear to auscultation bilaterally, heart S1-S2 present rhythmic, soft abdomen and no lower extremity edema. Right-sided weakness 4 out of 5, no significant facial droop. Sodium 140, potassium 3.6, chloride 105, BUN 12, creatinine 1.28,white count 3.8, hemoglobin 7.3, hematocrit 52.9, platelets 176. SARS COVID-19 was negative. Urinalysis specific gravity more than 1.046, 0-5 white cells, 0-5 red cells. Urine drug screen negative, alcohol less than 10.Marland Kitchen Head CT negative for acute changes. EKG 60 bpm, left axis deviation, left anterior fascicular block, first-degree AV block, sinusrhythm, no significant ST segment T wave changes.  Blood pressure has improved with oral antihypertensives.Patient has showed bradycardia on the telemetry monitor, with sinus exit blocks.  Patient continue to be very weak and deconditioned, PT/OT recommended SNF   Assessment & Plan:   Principal Problem:   Hypertensive emergency Active Problems:   Hyperlipidemia   Diabetes mellitus type 2, controlled (Javier Rivera)  Essential hypertension   Ascending aorta dilatation (HCC)   CVA (cerebral vascular accident) (Javier Rivera)   CKD stage 2 due to type 2 diabetes mellitus (Javier Rivera)    1. Hypertensive emergency.Continue uncontrolled improved systolic but diastolic continue to be above 100 mmHg, patient continue to complain of feeling weak and deconditioned.   Will continue blood pressure control with:  Amlodipine to 10 mg, losartan 50 mg daily, HCTZ to 25 mg daily, and will add 50 mg hydralazine bid. Continue close monitoring of blood pressure.   2. Bradycardia, type 1 and 2 second degree AV block.Echocardiogram from 02/21, with preserved LV systolic function with hypertrophy at the basal septal segment. 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   No further2nd degree AV block type one and two overnight, personally reviewed telemetry, positive non conducting PAC. HR has been stable at 65 bpm. Old records personally reviewed, patient had AV block second degree on prior hospitalization.   Continue with telemetry monitoring and aggressive blood pressure control. Avoid B blockade or any other AV blocking agent.   2. Hx of ischemic CVA with right sided weakness.Persistentright sided weakness. CT angiography head with no significant stenosis. Brain MRI with no acute CVA, but severe chronic small ischemic changes.  Continue with aspirin and statin. Patient pending transfer to SNF.   3. T2DM(Hgb A1c 6,9)/ dyslipidemia.Fasting glucose this am 91 mg/dl, will continue with insulin sliding scale for glucose cover and monitoring.   Onatorvastatin.   4. CKD stage 2. Renal function has been stable, will follow on renal panel ni am, avoid nephrotoxic medications.   DVT prophylaxis:heparin Code Status:full Family Communication:no family at the bedside Disposition Plan/ discharge barriers:patient from home, dc barrier persistent worsening focal neurologic deficit,uncontrolled hypertension and bradycardia, plan  for SNF.    Subjective: Patient continue to be very weak and deconditioned, no nausea  or vomiting, no chest pain or dyspnea, his blood pressure still not controlled.   Objective: Vitals:   10/22/19 0434 10/22/19 0633 10/22/19 0900 10/22/19 1129  BP: (!) 169/97  (!) 151/108 97/69  Pulse: 71  75 89  Resp: 20  18 16   Temp: 98.1 F (36.7 C)  97.7 F (36.5 C) 97.9 F (36.6 C)  TempSrc: Oral  Oral Oral  SpO2: 100%  100% 99%  Weight:  115.1 kg    Height:  6\' 9"  (2.057 m)      Intake/Output Summary (Last 24 hours) at 10/22/2019 1132 Last data filed at 10/22/2019 0745 Gross per 24 hour  Intake 200 ml  Output 600 ml  Net -400 ml   Filed Weights   10/22/19 0633  Weight: 115.1 kg    Examination:   General: Not in pain or dyspnea. Deconditioned  Neurology: Awake and alert, right sided weakness E ENT: no pallor, no icterus, oral mucosa moist Cardiovascular: No JVD. S1-S2 present, rhythmic, no gallops, rubs, or murmurs. No lower extremity edema. Pulmonary: positive breath sounds bilaterally, adequate air movement, no wheezing, rhonchi or rales. Gastrointestinal. Abdomen with no organomegaly, non tender, no rebound or guarding Skin. No rashes Musculoskeletal: no joint deformities     Data Reviewed: I have personally reviewed following labs and imaging studies  CBC: Recent Labs  Lab 10/18/19 1158 10/18/19 1200  WBC 3.8*  --   NEUTROABS 2.1  --   HGB 17.3* 17.7*  HCT 52.9* 52.0  MCV 88.9  --   PLT 176  --    Basic Metabolic Panel: Recent Labs  Lab 10/18/19 1158 10/18/19 1200 10/20/19 0440 10/21/19 0434  NA 139 140 136 137  K 3.8 3.6 3.8 4.1  CL 108 105 103 102  CO2 21*  --  25 25  GLUCOSE 108* 104* 117* 114*  BUN 10 12 14 13   CREATININE 1.28* 1.20 1.35* 1.38*  CALCIUM 8.6*  --  8.7* 8.8*  MG  --   --  2.0  --    GFR: Estimated Creatinine Clearance: 81.1 mL/min (A) (by C-G formula based on SCr of 1.38 mg/dL (H)). Liver Function Tests: Recent Labs  Lab  10/18/19 1158  AST 22  ALT 32  ALKPHOS 61  BILITOT 1.2  PROT 7.3  ALBUMIN 3.9   No results for input(s): LIPASE, AMYLASE in the last 168 hours. No results for input(s): AMMONIA in the last 168 hours. Coagulation Profile: Recent Labs  Lab 10/18/19 1158  INR 1.0   Cardiac Enzymes: No results for input(s): CKTOTAL, CKMB, CKMBINDEX, TROPONINI in the last 168 hours. BNP (last 3 results) No results for input(s): PROBNP in the last 8760 hours. HbA1C: No results for input(s): HGBA1C in the last 72 hours. CBG: Recent Labs  Lab 10/21/19 0616 10/21/19 1237 10/21/19 1828 10/21/19 2114 10/22/19 0557  GLUCAP 105* 162* 157* 151* 91   Lipid Profile: No results for input(s): CHOL, HDL, LDLCALC, TRIG, CHOLHDL, LDLDIRECT in the last 72 hours. Thyroid Function Tests: No results for input(s): TSH, T4TOTAL, FREET4, T3FREE, THYROIDAB in the last 72 hours. Anemia Panel: No results for input(s): VITAMINB12, FOLATE, FERRITIN, TIBC, IRON, RETICCTPCT in the last 72 hours.    Radiology Studies: I have reviewed all of the imaging during this hospital visit personally     Scheduled Meds: . amLODipine  10 mg Oral Daily  . aspirin EC  81 mg Oral Daily  . atorvastatin  80 mg Oral q1800  . heparin  5,000 Units  Subcutaneous Q8H  . hydrALAZINE  50 mg Oral q12n4p  . hydrochlorothiazide  25 mg Oral Daily  . insulin aspart  0-9 Units Subcutaneous TID WC  . losartan  50 mg Oral Daily  . polyethylene glycol  17 g Oral BID   Continuous Infusions:   LOS: 4 days        Storie Heffern Gerome Apley, MD

## 2019-10-22 NOTE — Plan of Care (Signed)
  Problem: Education: Goal: Knowledge of General Education information will improve Description Including pain rating scale, medication(s)/side effects and non-pharmacologic comfort measures Outcome: Progressing   

## 2019-10-23 LAB — GLUCOSE, CAPILLARY
Glucose-Capillary: 97 mg/dL (ref 70–99)
Glucose-Capillary: 94 mg/dL (ref 70–99)
Glucose-Capillary: 137 mg/dL — ABNORMAL HIGH (ref 70–99)
Glucose-Capillary: 177 mg/dL — ABNORMAL HIGH (ref 70–99)

## 2019-10-23 LAB — BASIC METABOLIC PANEL
Anion gap: 12 (ref 5–15)
BUN: 15 mg/dL (ref 6–20)
CO2: 27 mmol/L (ref 22–32)
Calcium: 9.3 mg/dL (ref 8.9–10.3)
Chloride: 99 mmol/L (ref 98–111)
Creatinine, Ser: 1.53 mg/dL — ABNORMAL HIGH (ref 0.61–1.24)
GFR calc Af Amer: 57 mL/min — ABNORMAL LOW (ref >60)
GFR calc non Af Amer: 49 mL/min — ABNORMAL LOW (ref >60)
Glucose, Bld: 117 mg/dL — ABNORMAL HIGH (ref 70–99)
Potassium: 4.1 mmol/L (ref 3.5–5.1)
Sodium: 138 mmol/L (ref 135–145)

## 2019-10-23 NOTE — TOC Progression Note (Signed)
Transition of Care [facility]) - Progression Note    Patient Details  Name: Javier Rivera MRN: [id number] Date of Birth: 1960-10-04  Transition of Care [facility]) CM/SW [place], [place] Phone Number: [phone] 10/23/2019, 3:46 PM  Clinical Narrative:     Pt was discussed during quality collaborative. Director requested information to be sent to [facility]. CSW faxed referral. CSW will follow.   Expected Discharge Plan: [place] Barriers to Discharge: Continued Medical Work up  Expected Discharge Plan and Services Expected Discharge Plan: [place] arrangements for the past 2 months: Apartment                                       Social Determinants of Health (SDOH) Interventions    Readmission Risk Interventions No flowsheet data found.   Emeterio Reeve, Latanya Presser, [place] Licensed Clinical Social Worker [phone]

## 2019-10-23 NOTE — Progress Notes (Signed)
Physical Therapy Treatment Patient Details Name: Javier Rivera MRN: [id number] DOB: 1961-02-10 Today's Date: 10/23/2019    History of Present Illness Javier Rivera is an 59 y.o. male  With PMH CVA (right side weakness, memory deficits), HTN, DM who presented to [facility] ED as a code stroke for worsening right side weakness  and right facial droop. CT head negative for bleeding, CTA no large vessel occlusion. MRI negative for stroke.     PT Comments    Pt agreeable to session with encouragement, although still refuses mobility out of room (does not provide reason). Ambulating without an assistive device at a min guard assist level. Presents as a high fall risk based on decreased gait speed, imbalance, and decreased safety awareness. Focused on serial sit to stands for functional strengthening, requiring extended time due to pt being easily distracted. Continue to recommend SNF for ongoing Physical Therapy.      Follow Up Recommendations  SNF;Supervision/Assistance - 24 hour     Equipment Recommendations  Rolling walker with 5" wheels (Tall height)   Recommendations for Other Services       Precautions / Restrictions Precautions Precautions: Fall Restrictions Weight Bearing Restrictions: No    Mobility  Bed Mobility Overal bed mobility: Modified Independent                Transfers Overall transfer level: Needs assistance Equipment used: None Transfers: Sit to/from Stand Sit to Stand: Supervision            Ambulation/Gait Ambulation/Gait assistance: Supervision;Min guard Gait Distance (Feet): 30 Feet Assistive device: None Gait Pattern/deviations: Decreased stance time - right;Decreased dorsiflexion - right Gait velocity: decreased   General Gait Details: Supervision-min guard with slow speed, dynamic instability, drift.   Stairs             Wheelchair Mobility    Modified Rankin (Stroke Patients Only) Modified Rankin (Stroke Patients  Only) Pre-Morbid Rankin Score: Moderate disability Modified Rankin: Moderately severe disability     Balance Overall balance assessment: Needs assistance Sitting-balance support: Feet supported Sitting balance-Leahy Scale: Good     Standing balance support: No upper extremity supported;During functional activity Standing balance-Leahy Scale: Fair                              Cognition Arousal/Alertness: Awake/alert Behavior During Therapy: Flat affect;Agitated Overall Cognitive Status: No family/caregiver present to determine baseline cognitive functioning                                 General Comments: Decreased attention span; easily distracted with environmental stimuli. Needs cues to stay on task. Became agitated and refused when asked to walk in hall      Exercises Other Exercises Other Exercises: x10 Sit to Stands    General Comments        Pertinent Vitals/Pain Pain Assessment: Faces Faces Pain Scale: Hurts little more Pain Location: headache Pain Descriptors / Indicators: Headache Pain Intervention(s): Monitored during session    Home Living                      Prior Function            PT Goals (current goals can now be found in the care plan section) Acute Rehab PT Goals Patient Stated Goal: did not state Potential to Achieve Goals: Fair Progress towards PT goals: Progressing  toward goals    Frequency    Min 3X/week      PT Plan Current plan remains appropriate    Co-evaluation              AM-PAC PT "6 Clicks" Mobility   Outcome Measure  Help needed turning from your back to your side while in a flat bed without using bedrails?: None Help needed moving from lying on your back to sitting on the side of a flat bed without using bedrails?: None Help needed moving to and from a bed to a chair (including a wheelchair)?: A Little Help needed standing up from a chair using your arms (e.g., wheelchair  or bedside chair)?: None Help needed to walk in hospital room?: A Little Help needed climbing 3-5 steps with a railing? : A Little 6 Click Score: 21    End of Session Equipment Utilized During Treatment: Gait belt Activity Tolerance: Other (comment)(self limiting) Patient left: in bed;with call bell/phone within reach;with bed alarm set Nurse Communication: Mobility status PT Visit Diagnosis: Unsteadiness on feet (R26.81);Other abnormalities of gait and mobility (R26.89);Muscle weakness (generalized) (M62.81);Hemiplegia and hemiparesis Hemiplegia - Right/Left: Right Hemiplegia - caused by: Cerebral infarction     Time: 1600-1630 PT Time Calculation (min) (ACUTE ONLY): 30 min  Charges:  $Therapeutic Activity: 23-37 mins                       Wyona Almas, PT, DPT Acute Rehabilitation Services Pager [phone] Office [phone]    Deno Etienne 10/23/2019, 5:29 PM

## 2019-10-23 NOTE — Progress Notes (Signed)
PROGRESS NOTE    Javier Rivera  [id number] DOB: 09/01/1960 DOA: 10/18/2019 PCP: Patient, No Pcp Per    Brief Narrative:  Patient was admitted to the hospital with hypertensive encephalopathy, hypertensive emergency.Hospitalization complicated with bradycardia.  59 year old male who presented with right-sided weakness. He does have significant past medical history for hypertension and CVA with chronic right-sided weakness since 2017. He also has type 2 diabetes mellitus, chronic kidney disease, polysubstance abuse and history of abdominal aortic aneurysm. Patient reported worsening right-sided weakness and right facial droop. Apparently he went to sleep at his usual state of health, he woke up around 7 AM with the above-mentioned neurologic deficits. On his initial physical examination his blood pressure was 161/108, heart rate 58, respiratory rate 14, oxygen saturation 97%,his lungs were clear to auscultation bilaterally, heart S1-S2 present rhythmic, soft abdomen and no lower extremity edema. Right-sided weakness 4 out of 5, no significant facial droop. Sodium 140, potassium 3.6, chloride 105, BUN 12, creatinine 1.28,white count 3.8, hemoglobin 7.3, hematocrit 52.9, platelets 176. SARS COVID-19 was negative. Urinalysis specific gravity more than 1.046, 0-5 white cells, 0-5 red cells. Urine drug screen negative, alcohol less than 10.Marland Kitchen Head CT negative for acute changes. EKG 60 bpm, left axis deviation, left anterior fascicular block, first-degree AV block, sinusrhythm, no significant ST segment T wave changes.  Blood pressure has improved with oral antihypertensives.Patient has showed bradycardia on the telemetry monitor, with sinus exit blocks.  Patient continue to be very weak and deconditioned, PT/OT recommended SNF    Assessment & Plan:   Principal Problem:   Hypertensive emergency Active Problems:   Hyperlipidemia   Diabetes mellitus type 2, controlled ([place])  Essential hypertension   Ascending aorta dilatation ([facility])   CVA (cerebral vascular accident) ([place])   CKD stage 2 due to type 2 diabetes mellitus ([place])   1. Hypertensive emergency.His blood pressure has improved with the addition of hydralazine, 131/87.   Bood pressure control with: Amlodipine to 10 mg,  Losartan 50 mg daily, HCTZ to 25 mg daily, a Hydralazine 50 mg bid.   Will hold on aldosterone blockade due to low GFR and risk of hyperkalemia.   2. Bradycardia, type 1 and 2 second degree AV block.Echocardiogram from 02/21, with preserved LV systolic function with hypertrophy at the basal septal segment. 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   Personally reviewed telemetry with no further 2nd degree AV block type one and two overnight. HR in the 70 with only non conducting PAC  Continue telemetry monitoring.   2. Hx of ischemic CVA with right sided weakness.Persistentright sided weakness. CT angiography head with no significant stenosis. Brain MRI with no acute CVA, but severe chronic small ischemic changes.  On aspirinand statin. Continue physical therapy.   3. T2DM(Hgb A1c 6,9)/ dyslipidemia.Continue with insulin sliding scale for glucosecover and monitoring.  Continue with atorvastatin.   4. CKD stage 2.stable renal function with serum cr at 1,53 with K at 4,1 and serum bicarbonate at 27.  DVT prophylaxis:heparin Code Status:full Family Communication:no family at the bedside Disposition Plan/ discharge barriers:patient from home, dc barrier persistent worsening focal neurologic deficit,uncontrolled hypertension and bradycardia, plan for SNF.     Subjective: Patient is feeling better, his blood pressure has improved, but continue to feel weak and deconditioned.   Objective: Vitals:   10/23/19 0028 10/23/19 0450 10/23/19 0900 10/23/19 1200  BP: (!) 133/99 (!) 139/97 (!) 147/99 (!) 145/105  Pulse: 86 69 79 73  Resp: 18 19 18 20   Temp:  98.3 F (36.8 C) 98.2 F (36.8 C) 98.2 F (36.8 C) 98.2 F (36.8 C)  TempSrc: Oral Oral Oral Oral  SpO2: 99% 99% 99% 99%  Weight:      Height:        Intake/Output Summary (Last 24 hours) at 10/23/2019 1601 Last data filed at 10/23/2019 1300 Gross per 24 hour  Intake 360 ml  Output 560 ml  Net -200 ml   Filed Weights   10/22/19 [id number]  Weight: 115.1 kg    Examination:   General: Not in pain or dyspnea  Neurology: Awake and alert, right sided weakness  E ENT: no pallor, no icterus, oral mucosa moist Cardiovascular: No JVD. S1-S2 present, rhythmic, no gallops, rubs, or murmurs. No lower extremity edema. Pulmonary: vesicular breath sounds bilaterally, adequate air movement, no wheezing, rhonchi or rales. Gastrointestinal. Abdomen with no organomegaly, non tender, no rebound or guarding Skin. No rashes Musculoskeletal: no joint deformities     Data Reviewed: I have personally reviewed following labs and imaging studies  CBC: Recent Labs  Lab 10/18/19 1158 10/18/19 1200  WBC 3.8*  --   NEUTROABS 2.1  --   HGB 17.3* 17.7*  HCT 52.9* 52.0  MCV 88.9  --   PLT 176  --    Basic Metabolic Panel: Recent Labs  Lab 10/18/19 1158 10/18/19 1200 10/20/19 0440 10/21/19 0434 10/23/19 0353  NA 139 140 136 137 138  K 3.8 3.6 3.8 4.1 4.1  CL 108 105 103 102 99  CO2 21*  --  25 25 27   GLUCOSE 108* 104* 117* 114* 117*  BUN 10 12 14 13 15   CREATININE 1.28* 1.20 1.35* 1.38* 1.53*  CALCIUM 8.6*  --  8.7* 8.8* 9.3  MG  --   --  2.0  --   --    GFR: Estimated Creatinine Clearance: 73.2 mL/min (A) (by C-G formula based on SCr of 1.53 mg/dL (H)). Liver Function Tests: Recent Labs  Lab 10/18/19 1158  AST 22  ALT 32  ALKPHOS 61  BILITOT 1.2  PROT 7.3  ALBUMIN 3.9   No results for input(s): LIPASE, AMYLASE in the last 168 hours. No results for input(s): AMMONIA in the last 168 hours. Coagulation Profile: Recent Labs  Lab 10/18/19 1158  INR 1.0   Cardiac  Enzymes: No results for input(s): CKTOTAL, CKMB, CKMBINDEX, TROPONINI in the last 168 hours. BNP (last 3 results) No results for input(s): PROBNP in the last 8760 hours. HbA1C: No results for input(s): HGBA1C in the last 72 hours. CBG: Recent Labs  Lab 10/22/19 1206 10/22/19 1709 10/22/19 2108 10/23/19 0627 10/23/19 1213  GLUCAP 128* 123* 119* 97 94   Lipid Profile: No results for input(s): CHOL, HDL, LDLCALC, TRIG, CHOLHDL, LDLDIRECT in the last 72 hours. Thyroid Function Tests: No results for input(s): TSH, T4TOTAL, FREET4, T3FREE, THYROIDAB in the last 72 hours. Anemia Panel: No results for input(s): VITAMINB12, FOLATE, FERRITIN, TIBC, IRON, RETICCTPCT in the last 72 hours.    Radiology Studies: I have reviewed all of the imaging during this hospital visit personally     Scheduled Meds: . amLODipine  10 mg Oral Daily  . aspirin EC  81 mg Oral Daily  . atorvastatin  80 mg Oral q1800  . heparin  5,000 Units Subcutaneous Q8H  . hydrALAZINE  50 mg Oral q12n4p  . hydrochlorothiazide  25 mg Oral Daily  . insulin aspart  0-9 Units Subcutaneous TID WC  . losartan  50 mg Oral Daily  .  polyethylene glycol  17 g Oral BID   Continuous Infusions:   LOS: 5 days        Javier Hettinger Gerome Apley, MD

## 2019-10-23 NOTE — Plan of Care (Signed)
  Problem: Education: Goal: Knowledge of General Education information will improve Description Including pain rating scale, medication(s)/side effects and non-pharmacologic comfort measures Outcome: Progressing   

## 2019-10-24 LAB — GLUCOSE, CAPILLARY
Glucose-Capillary: 115 mg/dL — ABNORMAL HIGH (ref 70–99)
Glucose-Capillary: 136 mg/dL — ABNORMAL HIGH (ref 70–99)
Glucose-Capillary: 146 mg/dL — ABNORMAL HIGH (ref 70–99)

## 2019-10-24 LAB — SARS CORONAVIRUS 2 BY RT PCR (DIASORIN): SARS Coronavirus 2: NEGATIVE

## 2019-10-24 MED ORDER — HYDRALAZINE HCL 50 MG PO TABS: 50.0000 mg | ORAL_TABLET | Freq: Two times a day (BID) | ORAL | 0 refills | Status: AC

## 2019-10-24 MED ORDER — POLYETHYLENE GLYCOL 3350 17 G PO PACK: 17.0000 g | PACK | Freq: Every day | ORAL | 0 refills | Status: AC | PRN

## 2019-10-24 MED ORDER — HYDRALAZINE HCL 50 MG PO TABS: 50.0000 mg | ORAL_TABLET | Freq: Two times a day (BID) | ORAL | 0 refills | Status: DC

## 2019-10-24 MED ORDER — HYDRALAZINE HCL 50 MG PO TABS
50.0000 mg | ORAL_TABLET | Freq: Two times a day (BID) | ORAL | Status: DC
  Administered 2019-10-24 (×2): 50 mg via ORAL
  Filled 2019-10-24 (×3): qty 1

## 2019-10-24 MED ORDER — HYDROCHLOROTHIAZIDE 25 MG PO TABS: 25.0000 mg | ORAL_TABLET | Freq: Every day | ORAL | 0 refills | Status: AC

## 2019-10-24 NOTE — Discharge Summary (Signed)
Physician Discharge Summary  Javier Rivera [id number] DOB: [birth year] DOA: 10/18/2019  PCP: Patient, No Pcp Per  Admit date: 10/18/2019 Discharge date: 10/24/2019  Admitted From: Home  Disposition:  SNF   Recommendations for Outpatient Follow-up and new medication changes:  1. Follow up with Primary care in 7 days.  2. Hydralazine was added and HCTZ was increased to 25 mg daily for blood pressure control. Avoid AV blockers due to risk of bradycardia.  3. Follow up renal panel as outpatient in 7 days.   Home Health: na   Equipment/Devices: na    Discharge Condition: stable  CODE STATUS: full  Diet recommendation: heart healthy and diabetic prudent,   Brief/Interim Summary: Patient was admitted to the hospital with hypertensive encephalopathy, hypertensive emergency.Hospitalization complicated with bradycardia.  59 year old male who presented with right-sided weakness. He does have significant past medical history for hypertension and CVA with chronic right-sided weakness since 2017. He also has type 2 diabetes mellitus, chronic kidney disease, polysubstance abuse and history of abdominal aortic aneurysm. Patient reported worsening right-sided weakness and right facial droop. Apparently he went to sleep at his usual state of health, he woke up around 7 AM with the above-mentioned neurologic deficits. On his initial physical examination his blood pressure was 161/108, heart rate 58, respiratory rate 14, oxygen saturation 97%,his lungs were clear to auscultation bilaterally, heart S1-S2 present rhythmic, soft abdomen and no lower extremity edema. Right-sided weakness 4 out of 5, no significant facial droop. Sodium 140, potassium 3.6, chloride 105, BUN 12, creatinine 1.28,white count 3.8, hemoglobin 7.3, hematocrit 52.9, platelets 176. SARS COVID-19 was negative. Urinalysis specific gravity more than 1.046, 0-5 white cells, 0-5 red cells. Urine drug screen negative, alcohol less  than 10.Marland Kitchen Head CT negative for acute changes. EKG 60 bpm, left axis deviation, left anterior fascicular block, first-degree AV block, sinusrhythm, no significant ST segment T wave changes.  Blood pressure improved with oral antihypertensives.Patient showed bradycardia on the telemetry monitor, intermittent 2nd degree type 1 and 1, that have resolved, now showing only occasional blocked premature atrial complexes.   Patient continue to be very weak and deconditioned, PT/OT recommended SNF.  1.  Hypertensive emergency, hypertensive encephalopathy.  Patient was admitted to the medical ward, he had frequent neuro checks, further work-up with brain MRI ruled out for acute CVA, it showed severe chronic small vessel ischemic changes with mild cerebral atrophy.  Patient has difficult to control hypertension, his new regimen includes: Amlodipine, hydrochlorothiazide, losartan and hydralazine.  Holding aldosterone blockade due to chronic kidney disease and risk of hyperkalemia.  2.  Bradycardia, intermittent type I-type II second-degree AV block.  Patient had a telemetry monitor placed, it showed intermittent second-degree type I-type II that has resolved.  Over the last 48 hours his telemetry has showed occasional nonconducting premature atrial complexes.  His heart rate has been in the mid 70s.  She has been able to work with physical therapy.  Would recommend to avoid any AV blockade due to risk of bradycardia.   3.  History ischemic CVA with right-sided weakness.  Continue physical therapy during his hospitalization, it was recommended him to follow-up with skilled nursing facility.  Continue statin and aspirin.  4.  Type 2 diabetes mellitus, hemoglobin A1c 6.9, dyslipidemia.  Patient received insulin sliding scale while hospitalized, his glucose remained well controlled, at discharge will continue diet control.  Continue atorvastatin.  5.  Chronic kidney disease stage II.  His renal function is  stable with a serum creatinine between 1.3  and 1.5, potassium 4.1, bicarb 27.  Will recommend close follow-up of kidney function as an outpatient.   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[facility])   CVA (cerebral vascular accident) ([place])   CKD stage 2 due to type 2 diabetes mellitus ([place])    Discharge Instructions   Allergies as of 10/24/2019      Reactions   Clonidine Derivatives Other (See Comments)   Pt reported the sweats and shakes   Lisinopril Palpitations, Other (See Comments)   "made me real sick, made my heart rate scatter"      Medication List    STOP taking these medications   cephALEXin 500 MG capsule Commonly known as: KEFLEX   gabapentin 300 MG capsule Commonly known as: NEURONTIN   hydrochlorothiazide 12.5 MG capsule Commonly known as: MICROZIDE Replaced by: hydrochlorothiazide 25 MG tablet     TAKE these medications   amLODipine 10 MG tablet Commonly known as: NORVASC Take 1 tablet (10 mg total) by mouth daily.   aspirin EC 81 MG tablet Take 1 tablet (81 mg total) by mouth daily.   atorvastatin 40 MG tablet Commonly known as: LIPITOR Take 1 tablet (40 mg total) by mouth daily at 6 PM.   hydrALAZINE 50 MG tablet Commonly known as: APRESOLINE Take 1 tablet (50 mg total) by mouth in the morning and at bedtime.   hydrochlorothiazide 25 MG tablet Commonly known as: HYDRODIURIL Take 1 tablet (25 mg total) by mouth daily. Start taking on: October 25, 2019 Replaces: hydrochlorothiazide 12.5 MG capsule   losartan 50 MG tablet Commonly known as: COZAAR Take 1 tablet (50 mg total) by mouth daily.   polyethylene glycol 17 g packet Commonly known as: MIRALAX / GLYCOLAX Take 17 g by mouth daily as needed (constipation).       Allergies  Allergen Reactions  . Clonidine Derivatives Other  (See Comments)    Pt reported the sweats and shakes  . Lisinopril Palpitations and Other (See Comments)    "made me real sick, made my heart rate scatter"    Consultations:     Procedures/Studies: CT Code Stroke CTA Head W/WO contrast  Result Date: 10/18/2019 CLINICAL DATA:  Right-sided weakness.  History of stroke. EXAM: CT ANGIOGRAPHY HEAD AND NECK CT PERFUSION BRAIN TECHNIQUE: Multidetector CT imaging of the head and neck was performed using the standard protocol during bolus administration of intravenous contrast. Multiplanar CT image reconstructions and MIPs were obtained to evaluate the vascular anatomy. Carotid stenosis measurements (when applicable) are obtained utilizing NASCET criteria, using the distal internal carotid diameter as the denominator. Multiphase CT imaging of the brain was performed following IV bolus contrast injection. Subsequent parametric perfusion maps were calculated using RAPID software. CONTRAST:  164mL OMNIPAQUE IOHEXOL 350 MG/ML SOLN COMPARISON:  CT head earlier today. CT head 09/23/2019. MRI 10/28/2016 FINDINGS: CTA NECK FINDINGS Aortic arch: Standard branching. Imaged portion shows no evidence of aneurysm or dissection. No significant stenosis of the major arch vessel origins. Right carotid system: Right carotid artery widely patent without stenosis. Minimal atherosclerotic disease right carotid bifurcation. Left carotid system: Atherosclerotic disease left carotid bulb without significant stenosis. Vertebral arteries: Both vertebral arteries widely patent to the basilar without stenosis. Skeleton: Cervical spondylosis. Multilevel spinal and foraminal stenosis due to spurring. Other neck: Negative for mass or adenopathy. Upper chest: Lung apices clear bilaterally. Review of the  MIP images confirms the above findings CTA HEAD FINDINGS Anterior circulation: Cavernous carotid widely patent bilaterally. Mild atherosclerotic disease. Anterior and middle cerebral arteries  patent bilaterally without stenosis. Negative for aneurysm. Posterior circulation: Both vertebral arteries patent to the basilar. PICA patent bilaterally. Basilar tortuous but patent bilaterally without significant stenosis. Superior cerebellar and posterior cerebral arteries patent bilaterally without stenosis. Venous sinuses: Limited venous enhancement due to arterial phase scanning Anatomic variants: None Review of the MIP images confirms the above findings CT Brain Perfusion Findings: ASPECTS: 10 CBF (<30%) Volume: 22mL Perfusion (Tmax>6.0s) volume: 0ML Mismatch Volume: 34mL Infarction Location:None IMPRESSION: 1. CT perfusion negative for acute ischemia or infarct 2. Negative for intracranial large vessel occlusion 3. No significant carotid or vertebral artery stenosis in the neck. 4. These results were called by telephone at the time of interpretation on 10/18/2019 at 12:29 pm to provider Erlinda Hong, who verbally acknowledged these results. Electronically Signed   By: Franchot Gallo M.D.   On: 10/18/2019 12:29   CT Code Stroke CTA Neck W/WO contrast  Result Date: 10/18/2019 CLINICAL DATA:  Right-sided weakness.  History of stroke. EXAM: CT ANGIOGRAPHY HEAD AND NECK CT PERFUSION BRAIN TECHNIQUE: Multidetector CT imaging of the head and neck was performed using the standard protocol during bolus administration of intravenous contrast. Multiplanar CT image reconstructions and MIPs were obtained to evaluate the vascular anatomy. Carotid stenosis measurements (when applicable) are obtained utilizing NASCET criteria, using the distal internal carotid diameter as the denominator. Multiphase CT imaging of the brain was performed following IV bolus contrast injection. Subsequent parametric perfusion maps were calculated using RAPID software. CONTRAST:  125mL OMNIPAQUE IOHEXOL 350 MG/ML SOLN COMPARISON:  CT head earlier today. CT head 09/23/2019. MRI 10/28/2016 FINDINGS: CTA NECK FINDINGS Aortic arch: Standard branching.  Imaged portion shows no evidence of aneurysm or dissection. No significant stenosis of the major arch vessel origins. Right carotid system: Right carotid artery widely patent without stenosis. Minimal atherosclerotic disease right carotid bifurcation. Left carotid system: Atherosclerotic disease left carotid bulb without significant stenosis. Vertebral arteries: Both vertebral arteries widely patent to the basilar without stenosis. Skeleton: Cervical spondylosis. Multilevel spinal and foraminal stenosis due to spurring. Other neck: Negative for mass or adenopathy. Upper chest: Lung apices clear bilaterally. Review of the MIP images confirms the above findings CTA HEAD FINDINGS Anterior circulation: Cavernous carotid widely patent bilaterally. Mild atherosclerotic disease. Anterior and middle cerebral arteries patent bilaterally without stenosis. Negative for aneurysm. Posterior circulation: Both vertebral arteries patent to the basilar. PICA patent bilaterally. Basilar tortuous but patent bilaterally without significant stenosis. Superior cerebellar and posterior cerebral arteries patent bilaterally without stenosis. Venous sinuses: Limited venous enhancement due to arterial phase scanning Anatomic variants: None Review of the MIP images confirms the above findings CT Brain Perfusion Findings: ASPECTS: 10 CBF (<30%) Volume: 44mL Perfusion (Tmax>6.0s) volume: 0ML Mismatch Volume: 7mL Infarction Location:None IMPRESSION: 1. CT perfusion negative for acute ischemia or infarct 2. Negative for intracranial large vessel occlusion 3. No significant carotid or vertebral artery stenosis in the neck. 4. These results were called by telephone at the time of interpretation on 10/18/2019 at 12:29 pm to provider Erlinda Hong, who verbally acknowledged these results. Electronically Signed   By: Franchot Gallo M.D.   On: 10/18/2019 12:29   MR BRAIN WO CONTRAST  Result Date: 10/18/2019 CLINICAL DATA:  Right-sided facial droop and weakness.  EXAM: MRI HEAD WITHOUT CONTRAST TECHNIQUE: Multiplanar, multiecho pulse sequences of the brain and surrounding structures were obtained without intravenous contrast. COMPARISON:  Head  CT, CTA, and CTP 10/18/2019 and MRI 10/28/2016 FINDINGS: Brain: There is no evidence of acute infarct, intracranial hemorrhage, mass, midline shift, or extra-axial fluid collection. Patchy T2 hyperintensities in the cerebral white matter and pons have not significantly changed from the prior MRI and are nonspecific but compatible with severely age advanced chronic small vessel ischemic disease. There is mild cerebral atrophy. Vascular: Major intracranial vascular flow voids are preserved. Skull and upper cervical spine: Unremarkable bone marrow signal. Sinuses/Orbits: Unremarkable orbits. Paranasal sinuses and mastoid air cells are clear. Other: None. IMPRESSION: 1. No acute intracranial abnormality. 2. Severe chronic small vessel ischemic disease. Electronically Signed   By: Logan Bores M.D.   On: 10/18/2019 16:28   CT CORONARY MORPH W/CTA COR W/SCORE W/CA W/CM &/OR WO/CM  Addendum Date: 09/24/2019   ADDENDUM REPORT: 09/24/2019 13:19 CLINICAL DATA:  Chest pain EXAM: Cardiac/Coronary CTA TECHNIQUE: The patient was scanned on a [employer]. A 100 kV prospective scan was triggered in the descending thoracic aorta at 111 HU's. Axial non-contrast 3 mm slices were carried out through the heart. The data set was analyzed on a dedicated work station and scored using the [place]. Gantry rotation speed was 250 msecs and collimation was .6 mm. No beta blockade and 0.8 mg of sl NTG was given. The 3D data set was reconstructed in 5% intervals of the 35-75 % of the R-R cycle. Diastolic phases were analyzed on a dedicated work station using MPR, MIP and VRT modes. The patient received 80 cc of contrast. FINDINGS: Image quality: excellent. Noise artifact is: Limited. Coronary Arteries:  Normal coronary origin.  Right dominance.  Left main: The left main is a large caliber vessel with a normal take off from the left coronary cusp that bifurcates to form a left anterior descending artery and a left circumflex artery. There is no plaque or stenosis. Left anterior descending artery: The proximal LAD contains minimal calcified plaque (<25%). The mid LAD contains minimal mixed density plaque (<25%). The distal LAD is patent. The first diagonal branch is patent. The second diagonal contains mild calcified plaque (25-49%). Left circumflex artery: The LCX is non-dominant. The proximal LCX is patent. The mid LCX contains minimal calcified plaque (<25%). OM1 contains minimal non-calcified plaque (<25%). OM2 contains mild mixed density plaque (25-49%). OM3 contains mild mixed density plaque (25-49%). The distal LCX is small and hypoplastic. Right coronary artery: The RCA is dominant. The proximal RCA contains minimal mixed density plaque (<25%). The mid RCA contains mild mixed density plaque (25-49%). There is significant motion artifact of the distal RCA, yet the segments remains interpretable. The distal RCA is ectatic without significant plaque. The RCA terminates as a PDA without evidence of plaque or stenosis. Right Atrium: Right atrial size is within normal limits. Right Ventricle: The right ventricular cavity is within normal limits. Left Atrium: Left atrial size is normal in size with no left atrial appendage filling defect. Left Ventricle: The ventricular cavity size is within normal limits. There are no stigmata of prior infarction. There is no abnormal filling defect. Pulmonary arteries: Normal in size without proximal filling defect. Pulmonary veins: Normal pulmonary venous drainage. Pericardium: Normal thickness with no significant effusion or calcium present. Cardiac valves: The aortic valve is trileaflet without significant calcification. The mitral valve is normal structure without significant calcification. Aorta: Aneurysm of the  ascending aorta. Mild calcification. The following measurements made using double oblique technique. Aortic root: 45 mm STJ: 47 mm Ascending aorta: 45 mm Extra-cardiac findings: See  attached radiology report for non-cardiac structures. IMPRESSION: 1. Coronary calcium score of 423. This was 99th percentile for age and sex matched control. 2. Normal coronary origin with right dominance. 3. 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 4. Distal RCA ectasia. 5. 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 RECOMMENDATIONS: 1. Mild non-obstructive CAD (25-49%). Consider non-atherosclerotic causes of chest pain. Consider preventive therapy and risk factor modification. Eleonore Chiquito, MD Electronically Signed   By: Eleonore Chiquito   On: 09/24/2019 13:19   Result Date: 09/24/2019 EXAM: OVER-READ INTERPRETATION  CT CHEST The following report is an over-read performed by radiologist Dr. Suzy Bouchard of [facility], [place] on 09/24/2019. This over-read does not include interpretation of cardiac or coronary anatomy or pathology. The coronary calcium score/coronary CTA interpretation by the cardiologist is attached. COMPARISON:  CT chest abdomen pelvis 09/23/2019 FINDINGS: Limited view of the lung parenchyma demonstrates no suspicious nodularity. Airways are normal. Limited view of the mediastinum demonstrates no adenopathy. Esophagus normal. Limited view of the upper abdomen demonstrates an enhancing lesion in the lateral LEFT hepatic lobe measuring 3.2 cm. This is described as benign hemangioma on recent CT scan. Limited view of the skeleton and chest wall is unremarkable. IMPRESSION: 1. Benign hemangioma LEFT hepatic lobe. 2. No additional significant extracardiac findings. Electronically Signed: By: Suzy Bouchard M.D. On: 09/24/2019 12:18   CT Code Stroke Cerebral Perfusion with contrast  Result Date: 10/18/2019 CLINICAL DATA:  Right-sided weakness.  History of  stroke. EXAM: CT ANGIOGRAPHY HEAD AND NECK CT PERFUSION BRAIN TECHNIQUE: Multidetector CT imaging of the head and neck was performed using the standard protocol during bolus administration of intravenous contrast. Multiplanar CT image reconstructions and MIPs were obtained to evaluate the vascular anatomy. Carotid stenosis measurements (when applicable) are obtained utilizing NASCET criteria, using the distal internal carotid diameter as the denominator. Multiphase CT imaging of the brain was performed following IV bolus contrast injection. Subsequent parametric perfusion maps were calculated using RAPID software. CONTRAST:  193mL OMNIPAQUE IOHEXOL 350 MG/ML SOLN COMPARISON:  CT head earlier today. CT head 09/23/2019. MRI 10/28/2016 FINDINGS: CTA NECK FINDINGS Aortic arch: Standard branching. Imaged portion shows no evidence of aneurysm or dissection. No significant stenosis of the major arch vessel origins. Right carotid system: Right carotid artery widely patent without stenosis. Minimal atherosclerotic disease right carotid bifurcation. Left carotid system: Atherosclerotic disease left carotid bulb without significant stenosis. Vertebral arteries: Both vertebral arteries widely patent to the basilar without stenosis. Skeleton: Cervical spondylosis. Multilevel spinal and foraminal stenosis due to spurring. Other neck: Negative for mass or adenopathy. Upper chest: Lung apices clear bilaterally. Review of the MIP images confirms the above findings CTA HEAD FINDINGS Anterior circulation: Cavernous carotid widely patent bilaterally. Mild atherosclerotic disease. Anterior and middle cerebral arteries patent bilaterally without stenosis. Negative for aneurysm. Posterior circulation: Both vertebral arteries patent to the basilar. PICA patent bilaterally. Basilar tortuous but patent bilaterally without significant stenosis. Superior cerebellar and posterior cerebral arteries patent bilaterally without stenosis. Venous  sinuses: Limited venous enhancement due to arterial phase scanning Anatomic variants: None Review of the MIP images confirms the above findings CT Brain Perfusion Findings: ASPECTS: 10 CBF (<30%) Volume: 38mL Perfusion (Tmax>6.0s) volume: 0ML Mismatch Volume: 21mL Infarction Location:None IMPRESSION: 1. CT perfusion negative for acute ischemia or infarct 2. Negative for intracranial large vessel occlusion 3. No significant carotid or vertebral artery stenosis in the neck. 4. These results were called by telephone at the time of  interpretation on 10/18/2019 at 12:29 pm to provider Erlinda Hong, who verbally acknowledged these results. Electronically Signed   By: Franchot Gallo M.D.   On: 10/18/2019 12:29   CT HEAD CODE STROKE WO CONTRAST  Result Date: 10/18/2019 CLINICAL DATA:  Code stroke.  Acute neurologic deficit EXAM: CT HEAD WITHOUT CONTRAST TECHNIQUE: Contiguous axial images were obtained from the base of the skull through the vertex without intravenous contrast. COMPARISON:  CT head 09/23/2019 FINDINGS: Brain: Image quality degraded by extensive motion. Other identified previously, there is diffuse chronic microvascular ischemic changes in the white matter. Allowing for motion, no acute cortical infarct, hemorrhage, mass. Ventricle size normal. Vascular: Negative for hyperdense vessel Skull: Negative Sinuses/Orbits: Negative Other: None ASPECTS ([place] Stroke Program Early CT Score) - Ganglionic level infarction (caudate, lentiform nuclei, internal capsule, insula, M1-M3 cortex): 7 - Supraganglionic infarction (M4-M6 cortex): 3 Total score (0-10 with 10 being normal): 10 IMPRESSION: 1. Image quality degraded by extensive motion. 2. No acute abnormality identified allowing for motion 3. Extensive chronic microvascular ischemic changes noted on the prior study. 4. ASPECTS is 10 Electronically Signed   By: Franchot Gallo M.D.   On: 10/18/2019 12:18      Procedures:   Subjective: Patient is feeling better, but  continue to be very weak and deconditioned, he is hoping to be transferred to SNF soon, no nausea or vomiting, no chest pain.   Discharge Exam: Vitals:   10/24/19 0408 10/24/19 0846  BP: (!) 139/96 (!) 146/107  Pulse: 71 78  Resp: 18 17  Temp: 98.6 F (37 C) 98 F (36.7 C)  SpO2: 97% 98%   Vitals:   10/23/19 2114 10/23/19 2321 10/24/19 0408 10/24/19 0846  BP: (!) 143/94 (!) 135/101 (!) 139/96 (!) 146/107  Pulse: 73 75 71 78  Resp: 19 19 18 17   Temp: 98.2 F (36.8 C) 98.6 F (37 C) 98.6 F (37 C) 98 F (36.7 C)  TempSrc: Oral Oral Oral Oral  SpO2: 100% 97% 97% 98%  Weight:      Height:        General: Not in pain or dyspnea, deconditioned  Neurology: Awake and alert, non focal. Chronic right sided weakness E ENT: no pallor, no icterus, oral mucosa moist Cardiovascular: No JVD. S1-S2 present, rhythmic, no gallops, rubs, or murmurs. No lower extremity edema. Pulmonary: positive breath sounds bilaterally, adequate air movement, no wheezing, rhonchi or rales. Gastrointestinal. Abdomen with no organomegaly, non tender, no rebound or guarding Skin. No rashes Musculoskeletal: no joint deformities    The results of significant diagnostics from this hospitalization (including imaging, microbiology, ancillary and laboratory) are listed below for reference.     Microbiology: Recent Results (from the past 240 hour(s))  SARS CORONAVIRUS 2 (TAT 6-24 HRS) Nasopharyngeal Nasopharyngeal Swab     Status: None   Collection Time: 10/18/19  2:15 PM   Specimen: Nasopharyngeal Swab  Result Value Ref Range Status   SARS Coronavirus 2 NEGATIVE NEGATIVE Final    Comment: (NOTE) SARS-CoV-2 target nucleic acids are NOT DETECTED. The SARS-CoV-2 RNA is generally detectable in upper and lower respiratory specimens during the acute phase of infection. Negative results do not preclude SARS-CoV-2 infection, do not rule out co-infections with other pathogens, and should not be used as the sole  basis for treatment or other patient management decisions. Negative results must be combined with clinical observations, patient history, and epidemiological information. The expected result is Negative. Fact Sheet for Patients: SugarRoll.be Fact Sheet for Healthcare Providers: https://www.woods-mathews.com/ This  test is not yet approved or cleared by the [place] and  has been authorized for detection and/or diagnosis of SARS-CoV-2 by FDA under an Emergency Use Authorization (EUA). This EUA will remain  in effect (meaning this test can be used) for the duration of the COVID-19 declaration under Section 56 4(b)(1) of the Act, 21 U.S.C. section 360bbb-3(b)(1), unless the authorization is terminated or revoked sooner. Performed at [facility], [place] [address] [place], [place] [address]      Labs: BNP (last 3 results) No results for input(s): BNP in the last 8760 hours. Basic Metabolic Panel: Recent Labs  Lab 10/18/19 1158 10/18/19 1200 10/20/19 0440 10/21/19 0434 10/23/19 0353  NA 139 140 136 137 138  K 3.8 3.6 3.8 4.1 4.1  CL 108 105 103 102 99  CO2 21*  --  25 25 27   GLUCOSE 108* 104* 117* 114* 117*  BUN 10 12 14 13 15   CREATININE 1.28* 1.20 1.35* 1.38* 1.53*  CALCIUM 8.6*  --  8.7* 8.8* 9.3  MG  --   --  2.0  --   --    Liver Function Tests: Recent Labs  Lab 10/18/19 1158  AST 22  ALT 32  ALKPHOS 61  BILITOT 1.2  PROT 7.3  ALBUMIN 3.9   No results for input(s): LIPASE, AMYLASE in the last 168 hours. No results for input(s): AMMONIA in the last 168 hours. CBC: Recent Labs  Lab 10/18/19 1158 10/18/19 1200  WBC 3.8*  --   NEUTROABS 2.1  --   HGB 17.3* 17.7*  HCT 52.9* 52.0  MCV 88.9  --   PLT 176  --    Cardiac Enzymes: No results for input(s): CKTOTAL, CKMB, CKMBINDEX, TROPONINI in the last 168 hours. BNP: Invalid input(s): POCBNP CBG: Recent Labs  Lab 10/23/19 0627 10/23/19 1213  10/23/19 1648 10/23/19 2114 10/24/19 0650  GLUCAP 97 94 137* 177* 115*   D-Dimer No results for input(s): DDIMER in the last 72 hours. Hgb A1c No results for input(s): HGBA1C in the last 72 hours. Lipid Profile No results for input(s): CHOL, HDL, LDLCALC, TRIG, CHOLHDL, LDLDIRECT in the last 72 hours. Thyroid function studies No results for input(s): TSH, T4TOTAL, T3FREE, THYROIDAB in the last 72 hours.  Invalid input(s): FREET3 Anemia work up No results for input(s): VITAMINB12, FOLATE, FERRITIN, TIBC, IRON, RETICCTPCT in the last 72 hours. Urinalysis    Component Value Date/Time   COLORURINE YELLOW 10/18/2019 1415   APPEARANCEUR CLEAR 10/18/2019 1415   LABSPEC >1.046 (H) 10/18/2019 1415   PHURINE 5.0 10/18/2019 1415   GLUCOSEU NEGATIVE 10/18/2019 1415   HGBUR NEGATIVE 10/18/2019 1415   BILIRUBINUR NEGATIVE 10/18/2019 1415   BILIRUBINUR negative 11/30/2017 1422   KETONESUR 5 (A) 10/18/2019 1415   PROTEINUR 30 (A) 10/18/2019 1415   UROBILINOGEN 2.0 (A) 11/30/2017 1422   UROBILINOGEN 1.0 10/30/2017 1030   NITRITE NEGATIVE 10/18/2019 1415   LEUKOCYTESUR NEGATIVE 10/18/2019 1415   Sepsis Labs Invalid input(s): PROCALCITONIN,  WBC,  LACTICIDVEN Microbiology Recent Results (from the past 240 hour(s))  SARS CORONAVIRUS 2 (TAT 6-24 HRS) Nasopharyngeal Nasopharyngeal Swab     Status: None   Collection Time: 10/18/19  2:15 PM   Specimen: Nasopharyngeal Swab  Result Value Ref Range Status   SARS Coronavirus 2 NEGATIVE NEGATIVE Final    Comment: (NOTE) SARS-CoV-2 target nucleic acids are NOT DETECTED. The SARS-CoV-2 RNA is generally detectable in upper and lower respiratory specimens during the acute phase of infection. Negative  results do not preclude SARS-CoV-2 infection, do not rule out co-infections with other pathogens, and should not be used as the sole basis for treatment or other patient management decisions. Negative results must be combined with clinical  observations, patient history, and epidemiological information. The expected result is Negative. Fact Sheet for Patients: SugarRoll.be Fact Sheet for Healthcare Providers: https://www.woods-mathews.com/ This test is not yet approved or cleared by the [place] FDA and  has been authorized for detection and/or diagnosis of SARS-CoV-2 by FDA under an Emergency Use Authorization (EUA). This EUA will remain  in effect (meaning this test can be used) for the duration of the COVID-19 declaration under Section 56 4(b)(1) of the Act, 21 U.S.C. section 360bbb-3(b)(1), unless the authorization is terminated or revoked sooner. Performed at [facility], [place] [address], [place], [place] [address]      Time coordinating discharge: 45 minutes  SIGNED:   Tawni Millers, MD  [facility] 10/24/2019, 10:58 AM

## 2019-10-24 NOTE — TOC Transition Note (Signed)
Transition of Care [facility]) - CM/SW Discharge Note   Patient Details  Name: Javier Rivera MRN: [id number] Date of Birth: August 14, 1960  Transition of [facility] CM/SW Contact:  Emeterio Reeve, [place] Phone Number:[phone] 10/24/2019, 2:51 PM   Clinical Narrative:     Nurse to call report to [phone]. Ask for nurse for Room 132. Patients room is 132 bed A.   Final next level of care: [place] Barriers to Discharge: Barriers Resolved   Patient Goals and CMS Choice Patient states their goals for this hospitalization and ongoing recovery are:: To go back home      Discharge Placement              Patient chooses bed at: [facility] Patient to be transferred to facility by: [facility]   Patient and family notified of of transfer: 10/24/19  Discharge Plan and Services                                     Social Determinants of Health (SDOH) Interventions     Readmission Risk Interventions No flowsheet data found.   Emeterio Reeve, Latanya Presser, [place] Licensed Clinical Social Worker [phone]

## 2019-10-24 NOTE — Progress Notes (Signed)
OT Cancellation Note  Patient Details Name: Renan Corwell MRN: [id number] DOB: [birth year]   Cancelled Treatment:    Reason Eval/Treat Not Completed: Patient declined, no reason specified. Attempted to see pt this morning, however pt refused stating "I don't want to." Educated pt on importance of daily activity and sitting upright throughout the day with pt continuing to refuse. OT will continue to follow acutely.   Mauri Brooklyn 10/24/2019, 11:50 AM

## 2019-10-24 NOTE — Plan of Care (Signed)
  Problem: Self-Care: Goal: Ability to communicate needs accurately will improve 10/24/2019 0826 by Caroll Rancher, RN Outcome: Progressing   Problem: Nutrition: Goal: Adequate nutrition will be maintained 10/24/2019 1440 by Caroll Rancher, RN Outcome: Adequate for Discharge 10/24/2019 1440 by Caroll Rancher, RN Outcome: Progressing 10/24/2019 0826 by Caroll Rancher, RN Outcome: Progressing

## 2019-10-24 NOTE — Plan of Care (Signed)
  Problem: Education: Goal: Knowledge of General Education information will improve Description: Including pain rating scale, medication(s)/side effects and non-pharmacologic comfort measures Outcome: Progressing   Problem: Activity: Goal: Risk for activity intolerance will decrease Outcome: Progressing   Problem: Nutrition: Goal: Adequate nutrition will be maintained Outcome: Progressing   Problem: Coping: Goal: Level of anxiety will decrease Outcome: Progressing   

## 2019-10-24 NOTE — Progress Notes (Signed)
Patient being discharged to [facility].  IV removed with the catheter intact.  Discharge instructions and prescription information given to the patient who verbalized understanding. Report called.
